# Patient Record
Sex: Female | Born: 1965 | Race: White | Hispanic: No | Marital: Married | Smoking: Never smoker
Health system: Southern US, Community
[De-identification: ages and names within clinical notes are randomized; demographics above are authoritative.]

## PROBLEM LIST (undated history)

## (undated) DIAGNOSIS — Z87442 Personal history of urinary calculi: Secondary | ICD-10-CM

## (undated) DIAGNOSIS — R339 Retention of urine, unspecified: Secondary | ICD-10-CM

## (undated) DIAGNOSIS — N390 Urinary tract infection, site not specified: Secondary | ICD-10-CM

## (undated) DIAGNOSIS — N1371 Vesicoureteral-reflux without reflux nephropathy: Secondary | ICD-10-CM

## (undated) DIAGNOSIS — N3020 Other chronic cystitis without hematuria: Secondary | ICD-10-CM

## (undated) DIAGNOSIS — K219 Gastro-esophageal reflux disease without esophagitis: Secondary | ICD-10-CM

## (undated) DIAGNOSIS — Z923 Personal history of irradiation: Secondary | ICD-10-CM

## (undated) HISTORY — PX: FOOT SURGERY: SHX648

## (undated) HISTORY — DX: Retention of urine, unspecified: R33.9

## (undated) HISTORY — PX: APPENDECTOMY: SHX54

## (undated) HISTORY — DX: Vesicoureteral-reflux without reflux nephropathy: N13.71

## (undated) HISTORY — PX: BREAST LUMPECTOMY: SHX2

## (undated) HISTORY — DX: Gastro-esophageal reflux disease without esophagitis: K21.9

## (undated) HISTORY — PX: OTHER SURGICAL HISTORY: SHX169

## (undated) HISTORY — DX: Other chronic cystitis without hematuria: N30.20

## (undated) HISTORY — DX: Urinary tract infection, site not specified: N39.0

---

## 1989-12-12 HISTORY — PX: BREAST BIOPSY: SHX20

## 1993-12-12 HISTORY — PX: LAPAROSCOPIC HYSTERECTOMY: SHX1926

## 1993-12-12 HISTORY — PX: ABDOMINAL HYSTERECTOMY: SHX81

## 1993-12-12 HISTORY — PX: OTHER SURGICAL HISTORY: SHX169

## 2000-12-12 HISTORY — PX: BREAST SURGERY: SHX581

## 2005-01-27 ENCOUNTER — Ambulatory Visit: Payer: Self-pay | Admitting: Internal Medicine

## 2006-02-20 ENCOUNTER — Ambulatory Visit: Payer: Self-pay | Admitting: Urology

## 2006-07-14 ENCOUNTER — Ambulatory Visit: Payer: Self-pay | Admitting: Internal Medicine

## 2006-11-07 ENCOUNTER — Ambulatory Visit: Payer: Self-pay | Admitting: Internal Medicine

## 2006-11-21 ENCOUNTER — Ambulatory Visit: Payer: Self-pay | Admitting: Internal Medicine

## 2007-10-01 ENCOUNTER — Ambulatory Visit: Payer: Self-pay | Admitting: Unknown Physician Specialty

## 2007-11-14 ENCOUNTER — Ambulatory Visit: Payer: Self-pay | Admitting: General Surgery

## 2008-12-03 ENCOUNTER — Ambulatory Visit: Payer: Self-pay | Admitting: General Surgery

## 2009-12-08 ENCOUNTER — Ambulatory Visit: Payer: Self-pay | Admitting: Unknown Physician Specialty

## 2009-12-16 ENCOUNTER — Ambulatory Visit: Payer: Self-pay | Admitting: Unknown Physician Specialty

## 2010-06-29 ENCOUNTER — Ambulatory Visit: Payer: Self-pay | Admitting: Unknown Physician Specialty

## 2010-09-28 ENCOUNTER — Emergency Department: Payer: Self-pay | Admitting: Unknown Physician Specialty

## 2010-12-09 ENCOUNTER — Ambulatory Visit: Payer: Self-pay | Admitting: Unknown Physician Specialty

## 2011-05-12 ENCOUNTER — Ambulatory Visit: Payer: Self-pay | Admitting: Internal Medicine

## 2011-12-12 ENCOUNTER — Ambulatory Visit: Payer: Self-pay | Admitting: Internal Medicine

## 2012-01-31 ENCOUNTER — Ambulatory Visit: Payer: Self-pay | Admitting: Urology

## 2012-03-06 ENCOUNTER — Ambulatory Visit: Payer: Self-pay | Admitting: Urology

## 2012-09-24 ENCOUNTER — Telehealth: Payer: Self-pay | Admitting: Internal Medicine

## 2012-09-24 DIAGNOSIS — N6019 Diffuse cystic mastopathy of unspecified breast: Secondary | ICD-10-CM

## 2012-09-24 NOTE — Telephone Encounter (Signed)
Pt came by and wanted to know if an order could be put in for her mammo. She usually had a diagnostic at [facility].

## 2012-09-24 NOTE — Telephone Encounter (Signed)
I placed an order for mammo.  See messaging.

## 2012-10-08 ENCOUNTER — Telehealth: Payer: Self-pay | Admitting: Internal Medicine

## 2012-10-08 NOTE — Telephone Encounter (Signed)
Pt called checking on mammogram appointment [phone]

## 2012-10-15 DIAGNOSIS — N319 Neuromuscular dysfunction of bladder, unspecified: Secondary | ICD-10-CM | POA: Insufficient documentation

## 2012-10-15 DIAGNOSIS — N3946 Mixed incontinence: Secondary | ICD-10-CM | POA: Insufficient documentation

## 2012-10-15 DIAGNOSIS — R339 Retention of urine, unspecified: Secondary | ICD-10-CM | POA: Insufficient documentation

## 2012-10-15 DIAGNOSIS — R399 Unspecified symptoms and signs involving the genitourinary system: Secondary | ICD-10-CM | POA: Insufficient documentation

## 2012-10-15 DIAGNOSIS — N1370 Vesicoureteral-reflux, unspecified: Secondary | ICD-10-CM | POA: Insufficient documentation

## 2012-10-23 ENCOUNTER — Ambulatory Visit: Payer: Self-pay | Admitting: Urology

## 2012-10-30 ENCOUNTER — Ambulatory Visit: Payer: Self-pay | Admitting: Urology

## 2012-12-12 HISTORY — PX: COLONOSCOPY: SHX174

## 2012-12-18 ENCOUNTER — Ambulatory Visit: Payer: Self-pay | Admitting: Internal Medicine

## 2012-12-21 ENCOUNTER — Encounter: Payer: Self-pay | Admitting: Internal Medicine

## 2012-12-21 DIAGNOSIS — Z800 Family history of malignant neoplasm of digestive organs: Secondary | ICD-10-CM

## 2013-01-01 ENCOUNTER — Encounter: Payer: Self-pay | Admitting: Internal Medicine

## 2013-02-01 ENCOUNTER — Encounter: Payer: Self-pay | Admitting: Internal Medicine

## 2013-02-07 ENCOUNTER — Encounter: Payer: Self-pay | Admitting: Internal Medicine

## 2013-02-07 ENCOUNTER — Ambulatory Visit (INDEPENDENT_AMBULATORY_CARE_PROVIDER_SITE_OTHER): Payer: Self-pay | Admitting: Internal Medicine

## 2013-02-07 VITALS — BP 99/66 | HR 88 | Temp 97.6°F | Ht 65.5 in | Wt 129.5 lb

## 2013-02-07 DIAGNOSIS — Z8742 Personal history of other diseases of the female genital tract: Secondary | ICD-10-CM

## 2013-02-07 DIAGNOSIS — N3020 Other chronic cystitis without hematuria: Secondary | ICD-10-CM

## 2013-02-07 DIAGNOSIS — R51 Headache: Secondary | ICD-10-CM

## 2013-02-07 MED ORDER — FLUTICASONE PROPIONATE 50 MCG/ACT NA SUSP: 2.0000 | Freq: Every day | NASAL | Status: DC

## 2013-02-08 ENCOUNTER — Ambulatory Visit: Payer: Self-pay | Admitting: Internal Medicine

## 2013-02-11 ENCOUNTER — Encounter: Payer: Self-pay | Admitting: Internal Medicine

## 2013-02-11 DIAGNOSIS — R519 Headache, unspecified: Secondary | ICD-10-CM | POA: Insufficient documentation

## 2013-02-11 DIAGNOSIS — Z8742 Personal history of other diseases of the female genital tract: Secondary | ICD-10-CM | POA: Insufficient documentation

## 2013-02-11 DIAGNOSIS — N3020 Other chronic cystitis without hematuria: Secondary | ICD-10-CM | POA: Insufficient documentation

## 2013-02-11 NOTE — Assessment & Plan Note (Signed)
Previously had issues with headaches.  Not reported as a problem today.

## 2013-02-11 NOTE — Assessment & Plan Note (Signed)
Was being followed by Dr Harold Hedge.  Now seeing Dr Toya Smothers.  Follow.

## 2013-02-11 NOTE — Assessment & Plan Note (Signed)
Seeing Dr Achilles Dunk.  Had bladder surgery.  Obtain latest records.  Currently doing better.

## 2013-02-11 NOTE — Progress Notes (Signed)
  Subjective:    Patient ID: Rose Bernard, female    DOB: 1966-09-04, 47 y.o.   MRN: [id number]  HPI 47 year old female with past history or frequent uti's who comes in today for a scheduled follow up.  She had bladder surgery 10/31/12.  Seeing Dr Achilles Dunk.  Not "leaking" like she was.  Has follow up planned 4/14.  Was recently evaluated and diagnosed with a sinus infection.  On Biaxin now.  Using Flonase and mucinex.  Better.  Planning for a colonoscopy next week.  Seeing Dr Markham Jordan.  She will make them aware of the abx and sinus infection.  Was evaluated at [facility]. Had her mammogram 1/14.  Obtain results.  Had pelvic exam.  Obtain records.  Overall she feels things are stable.   Past Medical History  Diagnosis Date  . Frequent UTI     Review of Systems Patient denies any headache, lightheadedness or dizziness.  Currently being treated for a sinus infection.  No chest pain, tightness or palpatations.  No increased shortness of breath, cough or congestion.  No nausea or vomiting.  No acid reflux.  No abdominal pain or cramping.  No bowel change, such as diarrhea, constipation, BRBPR or melana.  No urine change.  Overall bladder appears to be doing better.       Objective:   Physical Exam Filed Vitals:   02/07/13 1339  BP: 99/66  Pulse: 88  Temp: 97.6 F (24.20 C)   47 year old female in no acute distress.   HEENT:  Nares- clear.  Oropharynx - without lesions. NECK:  Supple.  Nontender.  No audible bruit.  HEART:  Appears to be regular. LUNGS:  No crackles or wheezing audible.  Respirations even and unlabored.  RADIAL PULSE:  Equal bilaterally. ABDOMEN:  Soft, nontender.  Bowel sounds present and normal.  No audible abdominal bruit.   EXTREMITIES:  No increased edema present.  DP pulses palpable and equal bilaterally.           Assessment & Plan:  SINUSITIS.  Currently being treated as outlined.  Follow.  Notify me if persistent problems.   HISTORY OF ABNORMAL MAMMOGRAM.   Saw Dr Lemar Livings.  Mammogram 12/12/11 - Birads II.  Obtain records from 1/14 mammogram.   HEALTH MAINTENANCE.  Is followed by [facility].  Plans to start getting her phyiscals here.  Planning for colonoscopy next week.  Cholesterol just checked 03/08/12 - wnl.  Mammogram 1/14.  Obtain results.

## 2013-02-20 DIAGNOSIS — Z800 Family history of malignant neoplasm of digestive organs: Secondary | ICD-10-CM | POA: Insufficient documentation

## 2013-02-27 ENCOUNTER — Encounter: Payer: Self-pay | Admitting: Adult Health

## 2013-02-27 ENCOUNTER — Ambulatory Visit (INDEPENDENT_AMBULATORY_CARE_PROVIDER_SITE_OTHER): Payer: BC Managed Care – PPO | Admitting: Adult Health

## 2013-02-27 ENCOUNTER — Telehealth: Payer: Self-pay | Admitting: Internal Medicine

## 2013-02-27 VITALS — BP 105/61 | HR 81 | Temp 98.1°F | Resp 16 | Ht 67.0 in | Wt 130.0 lb

## 2013-02-27 DIAGNOSIS — N390 Urinary tract infection, site not specified: Secondary | ICD-10-CM | POA: Insufficient documentation

## 2013-02-27 DIAGNOSIS — J329 Chronic sinusitis, unspecified: Secondary | ICD-10-CM

## 2013-02-27 LAB — POCT URINALYSIS DIPSTICK
Nitrite, UA: NEGATIVE
Spec Grav, UA: <=1.005
pH, UA: 5.5

## 2013-02-27 MED ORDER — CIPROFLOXACIN HCL 500 MG PO TB24: 500.0000 mg | ORAL_TABLET | Freq: Every day | ORAL | Status: DC

## 2013-02-27 MED ORDER — CIPROFLOXACIN HCL 500 MG PO TABS: 500.0000 mg | ORAL_TABLET | Freq: Two times a day (BID) | ORAL | Status: DC

## 2013-02-27 MED ORDER — HYDROCOD POLST-CHLORPHEN POLST 10-8 MG/5ML PO LQCR: 5.0000 mL | Freq: Two times a day (BID) | ORAL | Status: DC

## 2013-02-27 NOTE — Telephone Encounter (Signed)
Spoke with [facility] pharmacy and correction was made.

## 2013-02-27 NOTE — Assessment & Plan Note (Signed)
Patient presents with sinus symptoms as well as UTI. Dipstick done and shows trace leukocytes. Sending for culture and microscopic urine. I will cover for UTI and sinusitis. Cipro 500 mg x 7 days.

## 2013-02-27 NOTE — Progress Notes (Signed)
  Subjective:    Patient ID: Rose Bernard, female    DOB: [birth year], 47 y.o.   MRN: [id number]  HPI  Patient presents to clinic with congestion, sinus pressure, green mucus, chills, flank pain, cough keeping her up at night. She has taken mucinex D and robitussin. Symptoms began this past Saturday.   Current Outpatient Prescriptions on File Prior to Visit  Medication Sig Dispense Refill  . pseudoephedrine-guaifenesin (MUCINEX D) 60-600 MG per tablet Take 1 tablet by mouth every 12 (twelve) hours.      . clarithromycin (BIAXIN) 500 MG tablet Take 500 mg by mouth 2 (two) times daily.      . fluticasone (FLONASE) 50 MCG/ACT nasal spray Place 2 sprays into the nose daily.  16 g  1   No current facility-administered medications on file prior to visit.     Review of Systems  Constitutional: Positive for chills. Negative for fever.  HENT: Positive for congestion, sore throat, rhinorrhea and postnasal drip.   Respiratory: Positive for cough. Negative for chest tightness, shortness of breath and wheezing.   Gastrointestinal: Negative for nausea, vomiting and diarrhea.  Genitourinary: Positive for urgency and frequency. Negative for hematuria.  Psychiatric/Behavioral: Negative.     BP 105/61  Pulse 81  Temp(Src) 98.1 F (36.7 C) (Oral)  Resp 16  Ht 5\' 7"  (1.702 m)  Wt 130 lb (58.968 kg)  BMI 20.36 kg/m2  SpO2 97%  LMP 02/07/1994     Objective:   Physical Exam  Constitutional: She appears well-developed and well-nourished. No distress.  HENT:  Head: Normocephalic and atraumatic.  Left Ear: External ear normal.  Right ear canal slightly erythematous       Assessment & Plan:

## 2013-02-27 NOTE — Assessment & Plan Note (Signed)
Start Cipro 500 mg bid x 7 days. Patient also with UTI. Cover both.

## 2013-02-27 NOTE — Patient Instructions (Addendum)
  Please start Cipro today. You will take this for 7 days. Take 1 table twice daily.  Drink fluids to stay hydrated.  I have sent the urine for culture. I will let you know once the results are available.

## 2013-02-27 NOTE — Telephone Encounter (Signed)
Cipro is to expensive needing something else called into the pharmacy . Patient waiting.

## 2013-03-01 LAB — URINE CULTURE: Colony Count: >=100000

## 2013-03-04 ENCOUNTER — Telehealth: Payer: Self-pay | Admitting: Internal Medicine

## 2013-03-04 NOTE — Telephone Encounter (Signed)
Pt would like to find out results from her visit last week with Raquel.  Pt states she is also having severe neck pain and around her ear on the right side.   Please call.

## 2013-03-04 NOTE — Telephone Encounter (Signed)
Patient called again stating she is waiting on a return call. Please call patient

## 2013-03-05 NOTE — Telephone Encounter (Signed)
Patient notified that results have not been released and  and transferred to front so she can schedule appointment with Dr. Lorin Picket

## 2013-03-07 ENCOUNTER — Encounter: Payer: Self-pay | Admitting: Internal Medicine

## 2013-03-07 ENCOUNTER — Ambulatory Visit (INDEPENDENT_AMBULATORY_CARE_PROVIDER_SITE_OTHER)
Admission: RE | Admit: 2013-03-07 | Discharge: 2013-03-07 | Disposition: A | Discharge status: Home / Self Care | Payer: BC Managed Care – PPO | Source: Ambulatory Visit | Attending: Internal Medicine | Admitting: Internal Medicine

## 2013-03-07 ENCOUNTER — Ambulatory Visit (INDEPENDENT_AMBULATORY_CARE_PROVIDER_SITE_OTHER): Payer: BC Managed Care – PPO | Admitting: Internal Medicine

## 2013-03-07 VITALS — BP 100/54 | HR 73 | Temp 97.4°F | Ht 65.5 in | Wt 131.5 lb

## 2013-03-07 DIAGNOSIS — J329 Chronic sinusitis, unspecified: Secondary | ICD-10-CM

## 2013-03-07 DIAGNOSIS — N390 Urinary tract infection, site not specified: Secondary | ICD-10-CM

## 2013-03-07 DIAGNOSIS — M542 Cervicalgia: Secondary | ICD-10-CM

## 2013-03-07 MED ORDER — MELOXICAM 7.5 MG PO TABS: 7.5000 mg | ORAL_TABLET | Freq: Every day | ORAL | Status: DC

## 2013-03-07 MED ORDER — FLUTICASONE PROPIONATE 50 MCG/ACT NA SUSP: 2.0000 | Freq: Every day | NASAL | Status: DC

## 2013-03-07 MED ORDER — CYCLOBENZAPRINE HCL 5 MG PO TABS: ORAL_TABLET | ORAL | Status: DC

## 2013-03-08 ENCOUNTER — Encounter: Payer: Self-pay | Admitting: Internal Medicine

## 2013-03-08 ENCOUNTER — Telehealth: Payer: Self-pay | Admitting: Internal Medicine

## 2013-03-08 NOTE — Telephone Encounter (Signed)
Patient calling for her X ray results

## 2013-03-10 ENCOUNTER — Encounter: Payer: Self-pay | Admitting: Internal Medicine

## 2013-03-10 NOTE — Assessment & Plan Note (Signed)
Improved.  Follow.  

## 2013-03-10 NOTE — Assessment & Plan Note (Signed)
Symptoms improved.  Follow.  

## 2013-03-10 NOTE — Progress Notes (Signed)
  Subjective:    Patient ID: Rose Bernard, female    DOB: [birth year], 47 y.o.   MRN: [id number]  Neck Pain   47 year old female with past history or frequent uti's who comes in today as a work in with concerns regarding neck pain.  States symptoms started five days ago.  Increased neck pain - more on the right side.  Pain extencs up her right lateral neck.  Hurts up behind her ear.  Hurts to turn her head.  If she sits still and does not turn, no significant pain.  No headache.  No sinus pressure now.  Previous congestion - resolved.  No nausea or vomiting.  No urinary symptoms now.  No abdominal pain or cramping. Has been using ice and heat.     Past Medical History  Diagnosis Date  . Frequent UTI     Review of Systems Patient denies any headache, lightheadedness or dizziness.  Neck pain as outlined.  Hurts to tun her head.  No fever. Previous sinus symptoms have essentially resolved.  No rash.  No nausea or vomiting.  No bowel change.  Using ice and heat.  No injury or trauma.       Objective:   Physical Exam  Filed Vitals:   03/07/13 1336  BP: 100/54  Pulse: 73  Temp: 97.4 F (61.54 C)   47 year old female in no acute distress.   HEENT:  Nares- clear.  Oropharynx - without lesions. NECK:  Supple.  Increased pain with rotation of her head.  Pulling sensation with looking to right and left.   HEART:  Appears to be regular. LUNGS:  No crackles or wheezing audible.  Respirations even and unlabored.  RADIAL PULSE:  Equal bilaterally. ABDOMEN:  Soft, nontender.   EXTREMITIES:  No increased edema present.  DP pulses palpable and equal bilaterally.           Assessment & Plan:  MSK.  Neck pain as outlined.  Exam as outlined.  Treat with Mobic 7.5mg  q day.  Flexeril 5mg  q hs.  Check c-spine xray.  Further w/up pending.    HEALTH MAINTENANCE.  Is followed by [facility].  Plans to start getting her phyiscals here.  Planning for colonoscopy next week.  Cholesterol just checked  03/08/12 - wnl.  Mammogram 1/14.

## 2013-03-12 NOTE — Telephone Encounter (Signed)
Pt.notified

## 2013-03-25 ENCOUNTER — Telehealth: Payer: Self-pay | Admitting: Internal Medicine

## 2013-03-25 NOTE — Telephone Encounter (Signed)
If three weeks out, she will need to be reevaluated.  If agreeable, we can work her in this week.

## 2013-03-25 NOTE — Telephone Encounter (Signed)
Pt was seen by Raquel 3 weeks ago and was given something for sinus infection. And it has gotten much worse. She was wanting to know if something else could be called in or if she would have to be seen again ??? Pt uses [facility] on [address].

## 2013-03-25 NOTE — Telephone Encounter (Signed)
Called patient and set up appointment for her on Thursday at 8:15am for sinus infection

## 2013-03-28 ENCOUNTER — Ambulatory Visit: Payer: BC Managed Care – PPO | Admitting: Internal Medicine

## 2013-05-15 ENCOUNTER — Other Ambulatory Visit: Payer: Self-pay | Admitting: *Deleted

## 2013-05-15 DIAGNOSIS — N3941 Urge incontinence: Secondary | ICD-10-CM | POA: Insufficient documentation

## 2013-05-15 DIAGNOSIS — R350 Frequency of micturition: Secondary | ICD-10-CM | POA: Insufficient documentation

## 2013-06-04 ENCOUNTER — Telehealth: Payer: Self-pay | Admitting: Internal Medicine

## 2013-06-04 NOTE — Telephone Encounter (Signed)
FMLA paper work in Pharmac[employer]

## 2013-06-10 ENCOUNTER — Telehealth: Payer: Self-pay | Admitting: *Deleted

## 2013-06-10 NOTE — Telephone Encounter (Signed)
Pt called this morning wanting to know the status on her FMLA forms that she dropped off on 06/04/13

## 2013-06-11 NOTE — Telephone Encounter (Signed)
Pt informed that FMLA papers have been completed. Pt would like to pick them up. Copy made & original placed up front for pick up.

## 2013-06-11 NOTE — Telephone Encounter (Signed)
Form completed and placed in your box.  

## 2013-09-23 ENCOUNTER — Encounter: Payer: Self-pay | Admitting: Internal Medicine

## 2013-09-23 ENCOUNTER — Ambulatory Visit (INDEPENDENT_AMBULATORY_CARE_PROVIDER_SITE_OTHER): Payer: BC Managed Care – PPO | Admitting: Internal Medicine

## 2013-09-23 ENCOUNTER — Encounter (INDEPENDENT_AMBULATORY_CARE_PROVIDER_SITE_OTHER): Payer: Self-pay

## 2013-09-23 VITALS — BP 110/78 | HR 72 | Temp 98.0°F | Ht 66.0 in | Wt 126.0 lb

## 2013-09-23 DIAGNOSIS — N390 Urinary tract infection, site not specified: Secondary | ICD-10-CM

## 2013-09-23 DIAGNOSIS — Z23 Encounter for immunization: Secondary | ICD-10-CM

## 2013-09-23 DIAGNOSIS — R079 Chest pain, unspecified: Secondary | ICD-10-CM

## 2013-09-23 DIAGNOSIS — R0781 Pleurodynia: Secondary | ICD-10-CM

## 2013-09-23 DIAGNOSIS — Z800 Family history of malignant neoplasm of digestive organs: Secondary | ICD-10-CM

## 2013-09-23 DIAGNOSIS — Z124 Encounter for screening for malignant neoplasm of cervix: Secondary | ICD-10-CM

## 2013-09-23 DIAGNOSIS — R109 Unspecified abdominal pain: Secondary | ICD-10-CM

## 2013-09-23 DIAGNOSIS — Z8742 Personal history of other diseases of the female genital tract: Secondary | ICD-10-CM

## 2013-09-23 DIAGNOSIS — Z13220 Encounter for screening for lipoid disorders: Secondary | ICD-10-CM

## 2013-09-23 DIAGNOSIS — N3020 Other chronic cystitis without hematuria: Secondary | ICD-10-CM

## 2013-09-23 DIAGNOSIS — Z1211 Encounter for screening for malignant neoplasm of colon: Secondary | ICD-10-CM

## 2013-09-24 ENCOUNTER — Other Ambulatory Visit (HOSPITAL_COMMUNITY)
Admission: RE | Admit: 2013-09-24 | Discharge: 2013-09-24 | Disposition: A | Discharge status: Home / Self Care | Payer: BC Managed Care – PPO | Source: Ambulatory Visit | Attending: Internal Medicine | Admitting: Internal Medicine

## 2013-09-24 ENCOUNTER — Telehealth: Payer: Self-pay | Admitting: Internal Medicine

## 2013-09-24 DIAGNOSIS — Z1151 Encounter for screening for human papillomavirus (HPV): Secondary | ICD-10-CM | POA: Insufficient documentation

## 2013-09-24 DIAGNOSIS — Z01419 Encounter for gynecological examination (general) (routine) without abnormal findings: Secondary | ICD-10-CM | POA: Insufficient documentation

## 2013-09-24 NOTE — Telephone Encounter (Signed)
Patient Information:  Caller Name: Suszanne Conners  Phone: [phone]  Patient: Rose Bernard, Rose Bernard  Gender: Female  DOB: 10-07-1966  Age: 47 Years  PCP: Dale   Pregnant: No  Office Follow Up:  Does the office need to follow up with this patient?: Yes  Instructions For The Office: History of Migraines and similar symptoms reported. .  Out of Maxalt.  [facility] [address] is preferred pharmacy.   Symptoms  Reason For Call & Symptoms: Mom/ Lala calling about Terri.  Posterior headache and vomiting. She had flu shot 09/23/13.  Headache rated at 10 of 10.    Relates history of migraines with similar pain. Took Mucinex at 11:00 and Sudafed PE without relief.  Nose clear; earache.  Being up makes pain worse.  Emergent symptoms ruled out  Callback by PCP or Subspecialist within 1 Hour per Headache guideline due to Severe headache and has had severe headaches before.  Maxalt 10 mg was Rx previously. and worked estimated 3 weeks prior to this call.  She has no more medication.  Reviewed Health History In EMR: Yes  Reviewed Medications In EMR: Yes  Reviewed Allergies In EMR: Yes  Reviewed Surgeries / Procedures: Yes  Date of Onset of Symptoms: 09/24/2013  Treatments Tried: Mucinex, Sudafed PE  Treatments Tried Worked: No OB / GYN:  LMP: Unknown  Guideline(s) Used:  Headache  Disposition Per Guideline:   Callback by PCP or Subspecialist within 1 Hour  Reason For Disposition Reached:   Severe headache and has had severe headaches before  Advice Given:  Reassurance - Migraine Headache:  You have told me that this headache is similar to previous migraine headaches that you have had. If the pattern or severity of your headache changes, you will need to see your physician.  Migraine headaches are also called vascular headaches. A migraine can be anywhere from mild to severely painful. Sufferers often describe it as throbbing or pulsing. It is often just on one side. Associated symptoms include  nausea and vomiting. Some individuals will have visual or other neurological warning symptoms (aura) that a migraine is coming.  This sounds like a painful headache that you are having, but there are pain medications you can take and other instructions I can give you to reduce the pain.  Here is some care advice that should help.  Migraine Medication:   If your doctor has prescribed specific medication for your migraine, take it as directed as soon as the migraine starts.  Rest:   Lie down in a dark, quiet place and try to relax. Close your eyes and imagine your entire body relaxing.  Rest:   Lie down in a dark, quiet place and try to relax. Close your eyes and imagine your entire body relaxing.  Apply Cold to the Area:   Apply a cold wet washcloth or cold pack to the forehead for 20 minutes.  Apply Cold to the Area:   Apply a cold wet washcloth or cold pack to the forehead for 20 minutes.  Call Back If:  Headache lasts longer than 24 hours  You become worse.  Patient Will Follow Care Advice:  YES

## 2013-09-24 NOTE — Telephone Encounter (Signed)
If headache 10/10 - needs eval to confirm nothing more acute going on.  Needs eval today.  (acute care or er pending sx)

## 2013-09-24 NOTE — Telephone Encounter (Signed)
Duplicate. See other note.

## 2013-09-24 NOTE — Telephone Encounter (Signed)
Pt notified that based on her sx's it is recommended that she goes to Acute Care. Pt verbalized understanding & I asked pt to call with an update tomorrow.

## 2013-09-26 ENCOUNTER — Encounter: Payer: Self-pay | Admitting: *Deleted

## 2013-09-29 ENCOUNTER — Encounter: Payer: Self-pay | Admitting: Internal Medicine

## 2013-09-29 ENCOUNTER — Telehealth: Payer: Self-pay | Admitting: Internal Medicine

## 2013-09-29 DIAGNOSIS — R0781 Pleurodynia: Secondary | ICD-10-CM | POA: Insufficient documentation

## 2013-09-29 NOTE — Assessment & Plan Note (Signed)
Colonoscopy as outlined.  Heme positive on exam.  IFOB.  Follow.

## 2013-09-29 NOTE — Telephone Encounter (Signed)
Please call pt for an update.  I saw her and she was having rib pain and some breast pain.  Need to know if persistent pain or any change in her breast exam.  If so, I can schedule her mammogram earlier then when due or other w/up.

## 2013-09-29 NOTE — Progress Notes (Signed)
  Subjective:    Patient ID: Rose Bernard, female    DOB: 09-17-1966, 47 y.o.   MRN: [id number]  HPI 46 year old female with past history or frequent uti's who comes in today for her complete physical exam.  She had bladder surgery 10/31/12.  Seeing Dr Achilles Dunk.  Not "leaking" like she was.  Has received Botox injections.  Continues follow up with Dr Achilles Dunk.  Had seen Dr Markham Jordan.  Had colonoscopy in the spring.  Need results.    Was evaluated at [facility]. Had her mammogram 1/14.  Here for a physical.  Had been having problems with her right great toe and left fifth toe.  Had surgery.  Doing better.  Still pain.  Out of work.  Planning to follow up with Dr Charlsie Merles next week.  She also reports having some pain in her left lateral ribs.  No pain with deep breathing.  No sob.  No chest pain or tightness.     Past Medical History  Diagnosis Date  . Frequent UTI     Review of Systems Patient denies any significant headache, lightheadedness or dizziness.  No significant sinus or allergy symptoms.  No chest pain, tightness or palpatations.  No increased shortness of breath, cough or congestion.  No nausea or vomiting.  No acid reflux.  No abdominal pain or cramping.  No bowel change, such as diarrhea, constipation, BRBPR or melana.  No urine change.  Overall bladder appears to be doing better.  Rib pain as outlined.  S/p foot surgery.  Still with some pain.  Following with Dr Charlsie Merles.       Objective:   Physical Exam  Filed Vitals:   09/23/13 1530  BP: 110/78  Pulse: 72  Temp: 98 F (80.29 C)   47 year old female in no acute distress.   HEENT:  Nares- clear.  Oropharynx - without lesions. NECK:  Supple.  Nontender.  No audible bruit.  HEART:  Appears to be regular. LUNGS:  No crackles or wheezing audible.  Respirations even and unlabored.  RADIAL PULSE:  Equal bilaterally.    BREASTS:  No nipple discharge or nipple retraction present.  Some discomfort in the left breast 2-3:00.  No axillary  adenopathy.   ABDOMEN:  Soft, nontender.  Bowel sounds present and normal.  No audible abdominal bruit.  GU:  Normal external genitalia.  Vaginal vault without lesions. Pap performed. Could not appreciate any adnexal masses or tenderness.   RECTAL:  Heme positive.     EXTREMITIES:  No increased edema present.  DP pulses palpable and equal bilaterally.           Assessment & Plan:  HISTORY OF ABNORMAL MAMMOGRAM.  Saw Dr Lemar Livings.  Mammogram 12/12/11 - Birads II.  Obtain records from 1/14 mammogram.   HEALTH MAINTENANCE.  Physical today.   Colonoscopy in the spring 2014.  Results as outlined.   Cholesterol just checked 03/08/12 - wnl.  Mammogram 1/14 - Birads II.  Obtain results.

## 2013-09-29 NOTE — Assessment & Plan Note (Addendum)
Was being followed by Dr Kincius.  Then was following with Dr Knowles- Jonas.  Currently stable.   

## 2013-09-29 NOTE — Assessment & Plan Note (Signed)
Seeing Dr Cope.  Had bladder surgery.  S/p botox injections.   Currently doing better.   

## 2013-09-29 NOTE — Assessment & Plan Note (Signed)
Not an issue now.  Continues to follow up with Dr Achilles Dunk.

## 2013-09-29 NOTE — Assessment & Plan Note (Signed)
States is better.  Still some discomfort.  Wants to hold on any further w/up at this time.  Follow. Notify me if pain persist.

## 2013-09-30 ENCOUNTER — Other Ambulatory Visit (INDEPENDENT_AMBULATORY_CARE_PROVIDER_SITE_OTHER): Payer: BC Managed Care – PPO

## 2013-09-30 DIAGNOSIS — Z13220 Encounter for screening for lipoid disorders: Secondary | ICD-10-CM

## 2013-09-30 DIAGNOSIS — R109 Unspecified abdominal pain: Secondary | ICD-10-CM

## 2013-09-30 DIAGNOSIS — Z1211 Encounter for screening for malignant neoplasm of colon: Secondary | ICD-10-CM

## 2013-09-30 LAB — COMPREHENSIVE METABOLIC PANEL
ALT: 12 U/L (ref 0–35)
AST: 13 U/L (ref 0–37)
Albumin: 3.9 g/dL (ref 3.5–5.2)
Alkaline Phosphatase: 53 U/L (ref 39–117)
CO2: 27 mEq/L (ref 19–32)
Creatinine, Ser: 0.7 mg/dL (ref 0.4–1.2)
GFR: 95.04 mL/min (ref >60.00)
Potassium: 4 mEq/L (ref 3.5–5.1)
Sodium: 140 mEq/L (ref 135–145)
Total Bilirubin: 1 mg/dL (ref 0.3–1.2)
Total Protein: 7 g/dL (ref 6.0–8.3)

## 2013-09-30 LAB — CBC WITH DIFFERENTIAL/PLATELET
Basophils Absolute: 0 10*3/uL (ref 0.0–0.1)
HCT: 39.2 % (ref 36.0–46.0)
Lymphs Abs: 1.3 10*3/uL (ref 0.7–4.0)
MCV: 92.8 fl (ref 78.0–100.0)
Monocytes Absolute: 0.4 10*3/uL (ref 0.1–1.0)
Monocytes Relative: 8.6 % (ref 3.0–12.0)
Platelets: 254 10*3/uL (ref 150.0–400.0)
RDW: 13.1 % (ref 11.5–14.6)

## 2013-09-30 LAB — LIPID PANEL
HDL: 72.8 mg/dL (ref >39.00)
Total CHOL/HDL Ratio: 2
Triglycerides: 44 mg/dL (ref 0.0–149.0)
VLDL: 8.8 mg/dL (ref 0.0–40.0)

## 2013-09-30 LAB — TSH: TSH: 1.25 u[IU]/mL (ref 0.35–5.50)

## 2013-09-30 NOTE — Telephone Encounter (Signed)
Noted  

## 2013-09-30 NOTE — Telephone Encounter (Signed)
Pt states that she is doing fine & plans on calling today to schedule mammogram for January.

## 2013-10-01 ENCOUNTER — Encounter: Payer: Self-pay | Admitting: *Deleted

## 2013-10-01 ENCOUNTER — Encounter: Payer: Self-pay | Admitting: Podiatry

## 2013-10-01 ENCOUNTER — Ambulatory Visit (INDEPENDENT_AMBULATORY_CARE_PROVIDER_SITE_OTHER): Payer: BC Managed Care – PPO | Admitting: Podiatry

## 2013-10-01 ENCOUNTER — Ambulatory Visit (INDEPENDENT_AMBULATORY_CARE_PROVIDER_SITE_OTHER): Payer: BC Managed Care – PPO

## 2013-10-01 VITALS — BP 126/70 | HR 72 | Ht 66.0 in | Wt 120.0 lb

## 2013-10-01 DIAGNOSIS — M2021 Hallux rigidus, right foot: Secondary | ICD-10-CM

## 2013-10-01 DIAGNOSIS — M2040 Other hammer toe(s) (acquired), unspecified foot: Secondary | ICD-10-CM

## 2013-10-01 DIAGNOSIS — M2020 Hallux rigidus, unspecified foot: Secondary | ICD-10-CM

## 2013-10-01 DIAGNOSIS — M2042 Other hammer toe(s) (acquired), left foot: Secondary | ICD-10-CM

## 2013-10-01 DIAGNOSIS — M2010 Hallux valgus (acquired), unspecified foot: Secondary | ICD-10-CM

## 2013-10-01 LAB — FECAL OCCULT BLOOD, IMMUNOCHEMICAL: Fecal Occult Bld: NEGATIVE

## 2013-10-01 NOTE — Patient Instructions (Signed)
Increase activity over the next 4 weeks and use ibuprophen for discomfort

## 2013-10-01 NOTE — Progress Notes (Signed)
Subjective:     Patient ID: Rose Bernard, female   DOB: 05/03/1966, 47 y.o.   MRN: [id number]  HPI patient states I'm doing well but still having swelling right over left foot and my left fifth toe gets sore when it gets bumped   Review of Systems  All other systems reviewed and are negative.       Objective:   Physical Exam  Constitutional: She appears well-developed and well-nourished.  Musculoskeletal: Normal range of motion.  Neurological: She is alert.  Skin: Skin is warm.   patient's feet are healing well and the incision site right is doing very well good range of motion first MPJ 35 dorsiflexion 25 of plantarflexion. Toe left mildly swollen but healing well     Assessment:     Patient doing well normal recovery from extensive forefoot surgery    Plan:     Encouraged to wear different types of shoes and gradually increase activity. Reviewed x-rays and we'll reappoint 4 weeks with hopeful return to work at that time

## 2013-10-29 ENCOUNTER — Ambulatory Visit (INDEPENDENT_AMBULATORY_CARE_PROVIDER_SITE_OTHER): Payer: BC Managed Care – PPO

## 2013-10-29 ENCOUNTER — Encounter: Payer: Self-pay | Admitting: Podiatry

## 2013-10-29 ENCOUNTER — Ambulatory Visit (INDEPENDENT_AMBULATORY_CARE_PROVIDER_SITE_OTHER): Payer: BC Managed Care – PPO | Admitting: Podiatry

## 2013-10-29 VITALS — BP 102/69 | HR 92 | Resp 16 | Ht 66.0 in | Wt 119.0 lb

## 2013-10-29 DIAGNOSIS — Z9889 Other specified postprocedural states: Secondary | ICD-10-CM

## 2013-10-29 DIAGNOSIS — M2021 Hallux rigidus, right foot: Secondary | ICD-10-CM

## 2013-10-29 DIAGNOSIS — M2040 Other hammer toe(s) (acquired), unspecified foot: Secondary | ICD-10-CM

## 2013-10-29 DIAGNOSIS — M2020 Hallux rigidus, unspecified foot: Secondary | ICD-10-CM

## 2013-10-29 NOTE — Progress Notes (Signed)
Subjective:     Patient ID: Rose Bernard, female   DOB: 04/15/1966, 47 y.o.   MRN: [id number]  HPI patient states IM doing better but still getting some tingling in my right foot and the little toe on my left foot will bother me at times approximately 10 weeks after surgery   Review of Systems     Objective:   Physical Exam  Nursing note and vitals reviewed. Constitutional: She is oriented to person, place, and time.  Cardiovascular: Intact distal pulses.   Musculoskeletal: Normal range of motion.  Neurological: She is oriented to person, place, and time.  Skin: Skin is warm.   patient is found to have well-healed surgical sites on the right dorsum foot and the left fifth toe with mild edema in the left fifth toe and excellent range of motion of the first MPJ right with 35 of dorsiflexion and 25 of plantar flexion with no pain or crepitus noted    Assessment:     Healing well post surgery right and left foot    Plan:     Educated on return to activity and allow patient to return to work on Monday. Reviewed x-rays with patient and she will be seen back if any issues should occur

## 2013-10-29 NOTE — Progress Notes (Signed)
  Subjective:    Patient ID: Rose Bernard, female    DOB: [birth year], 47 y.o.   MRN: [id number]  HPI    Review of Systems  Constitutional: Negative.   HENT: Negative.   Eyes: Negative.   Respiratory: Negative.   Cardiovascular: Negative.   Gastrointestinal: Negative.   Endocrine: Negative.   Genitourinary: Negative.   Musculoskeletal: Negative.   Skin: Negative.   Allergic/Immunologic: Negative.   Neurological: Negative.   Hematological: Negative.   Psychiatric/Behavioral: Negative.        Objective:   Physical Exam        Assessment & Plan:

## 2013-12-19 ENCOUNTER — Ambulatory Visit: Payer: Self-pay | Admitting: Internal Medicine

## 2013-12-19 LAB — HM MAMMOGRAPHY: HM Mammogram: NEGATIVE

## 2013-12-23 ENCOUNTER — Encounter: Payer: Self-pay | Admitting: Internal Medicine

## 2013-12-26 ENCOUNTER — Ambulatory Visit (INDEPENDENT_AMBULATORY_CARE_PROVIDER_SITE_OTHER): Payer: BC Managed Care – PPO | Admitting: Internal Medicine

## 2013-12-26 ENCOUNTER — Encounter: Payer: Self-pay | Admitting: Internal Medicine

## 2013-12-26 VITALS — BP 110/60 | HR 65 | Temp 98.1°F | Ht 66.0 in | Wt 129.0 lb

## 2013-12-26 DIAGNOSIS — R079 Chest pain, unspecified: Secondary | ICD-10-CM

## 2013-12-26 DIAGNOSIS — R0981 Nasal congestion: Secondary | ICD-10-CM

## 2013-12-26 DIAGNOSIS — R0781 Pleurodynia: Secondary | ICD-10-CM

## 2013-12-26 DIAGNOSIS — Z800 Family history of malignant neoplasm of digestive organs: Secondary | ICD-10-CM

## 2013-12-26 DIAGNOSIS — M533 Sacrococcygeal disorders, not elsewhere classified: Secondary | ICD-10-CM

## 2013-12-26 DIAGNOSIS — Z8742 Personal history of other diseases of the female genital tract: Secondary | ICD-10-CM

## 2013-12-26 DIAGNOSIS — J3489 Other specified disorders of nose and nasal sinuses: Secondary | ICD-10-CM

## 2013-12-26 DIAGNOSIS — N3020 Other chronic cystitis without hematuria: Secondary | ICD-10-CM

## 2013-12-26 MED ORDER — FLUTICASONE PROPIONATE 50 MCG/ACT NA SUSP: 2.0000 | Freq: Every day | NASAL | Status: DC

## 2013-12-26 NOTE — Progress Notes (Signed)
  Subjective:    Patient ID: Rose Bernard, female    DOB: [birth year], 48 y.o.   MRN: [id number]  HPI 48 year old female with past history or frequent uti's who comes in today for a scheduled follow up.  She had bladder surgery 10/31/12.  Seeing Dr Jacqlyn Larsen.  Not "leaking" like she was.  Has received Botox injections.  Continues follow up with Dr Jacqlyn Larsen.  Had seen Dr Tiffany Kocher.  Had colonoscopy in the spring.   Was evaluated at [facility]. Had her mammogram 1/14 ok.  States just had f/u mammogram and was ok.  Had been having problems with her right great toe and left fifth toe.  Had surgery.  Doing better.  Still some pain, but is better.  Back to work 11/04/13.  Wearing steel toed boots now and doing better.  Seeing Dr Paulla Dolly.  No sob.  No chest pain or tightness.     Past Medical History  Diagnosis Date  . Frequent UTI     Current Outpatient Prescriptions on File Prior to Visit  Medication Sig Dispense Refill  . acetaminophen (TYLENOL) 500 MG tablet Take 500 mg by mouth as needed for pain.       No current facility-administered medications on file prior to visit.   Review of Systems Patient denies any significant headache, lightheadedness or dizziness.  Some nasal congestion.  No chest pain, tightness or palpatations.  No increased shortness of breath, cough or congestion.  No nausea or vomiting.  No acid reflux.  No abdominal pain or cramping.  No bowel change, such as diarrhea, constipation, BRBPR or melana.  No urine change.  Overall bladder appears to be doing better.  S/p foot surgery.  Following with Dr Paulla Dolly.       Objective:   Physical Exam  Filed Vitals:   12/26/13 1602  BP: 110/60  Pulse: 65  Temp: 98.1 F (42.50 C)   48 year old female in no acute distress.   HEENT:  Nares- clear.  Oropharynx - without lesions. NECK:  Supple.  Nontender.  No audible bruit.  HEART:  Appears to be regular. LUNGS:  No crackles or wheezing audible.  Respirations even and unlabored.  RADIAL  PULSE:  Equal bilaterally.   ABDOMEN:  Soft, nontender.  Bowel sounds present and normal.  No audible abdominal bruit.      EXTREMITIES:  No increased edema present.  DP pulses palpable and equal bilaterally.           Assessment & Plan:  HISTORY OF ABNORMAL MAMMOGRAM.  Saw Dr Bary Castilla.  Mammogram 12/12/11 - Birads II.  Had mammogram 1/14 and 1/15.  Per pt ok.    HEALTH MAINTENANCE.  Physical last visit.   Colonoscopy in the spring 2014.  Results as outlined.   Mammogram 1/14 - Birads II.  States just had mammogram.  Everything checked out fine per pt.  Obtain results.

## 2013-12-26 NOTE — Progress Notes (Signed)
Pre-visit discussion using our clinic review tool. No additional management support is needed unless otherwise documented below in the visit note.  

## 2013-12-29 ENCOUNTER — Encounter: Payer: Self-pay | Admitting: Internal Medicine

## 2013-12-29 DIAGNOSIS — M533 Sacrococcygeal disorders, not elsewhere classified: Secondary | ICD-10-CM | POA: Insufficient documentation

## 2013-12-29 DIAGNOSIS — R0981 Nasal congestion: Secondary | ICD-10-CM | POA: Insufficient documentation

## 2013-12-29 NOTE — Assessment & Plan Note (Signed)
Seeing Dr Cope.  Had bladder surgery.  S/p botox injections.   Currently doing better.   

## 2013-12-29 NOTE — Assessment & Plan Note (Signed)
Not an issue now

## 2013-12-29 NOTE — Assessment & Plan Note (Signed)
Some pain that appears to be more localized to her coccyx.  Appears to be better.  Still some intermittent pain.  Desires no further testing or evaluation.  Try to keep pressure off the area.  Follow.  Notify me if persistent or if she changes her mind.

## 2013-12-29 NOTE — Assessment & Plan Note (Signed)
Colonoscopy as outlined.  Recommended f/u colonoscopy in five years.  IFOB just checked - negative.

## 2013-12-29 NOTE — Assessment & Plan Note (Signed)
Was being followed by Dr Kincius.  Then was following with Dr Knowles- Jonas.  Currently stable.   

## 2013-12-29 NOTE — Assessment & Plan Note (Signed)
Minimal symptoms.  Do not feel abx warranted.  Saline nasal spray and flonase as directed.  Follow.

## 2014-01-19 ENCOUNTER — Emergency Department: Payer: Self-pay | Admitting: Emergency Medicine

## 2014-03-21 ENCOUNTER — Encounter: Payer: Self-pay | Admitting: Adult Health

## 2014-03-21 ENCOUNTER — Ambulatory Visit (INDEPENDENT_AMBULATORY_CARE_PROVIDER_SITE_OTHER): Payer: BC Managed Care – PPO | Admitting: Adult Health

## 2014-03-21 VITALS — BP 110/71 | HR 61 | Temp 98.4°F | Wt 127.0 lb

## 2014-03-21 DIAGNOSIS — R109 Unspecified abdominal pain: Secondary | ICD-10-CM

## 2014-03-21 DIAGNOSIS — R3129 Other microscopic hematuria: Secondary | ICD-10-CM

## 2014-03-21 LAB — URINALYSIS, ROUTINE W REFLEX MICROSCOPIC
BILIRUBIN URINE: NEGATIVE
HGB URINE DIPSTICK: NEGATIVE
KETONES UR: NEGATIVE
Leukocytes, UA: NEGATIVE
Nitrite: NEGATIVE
Specific Gravity, Urine: 1.025 (ref 1.000–1.030)
TOTAL PROTEIN, URINE-UPE24: NEGATIVE
URINE GLUCOSE: NEGATIVE
UROBILINOGEN UA: 0.2 (ref 0.0–1.0)
pH: 6 (ref 5.0–8.0)

## 2014-03-21 LAB — POCT URINALYSIS DIPSTICK
Bilirubin, UA: NEGATIVE
Glucose, UA: NEGATIVE
Ketones, UA: NEGATIVE
LEUKOCYTES UA: NEGATIVE
NITRITE UA: NEGATIVE
PROTEIN UA: NEGATIVE
Spec Grav, UA: 1.025
Urobilinogen, UA: 1
pH, UA: 6

## 2014-03-21 MED ORDER — CIPROFLOXACIN HCL 250 MG PO TABS: 250.0000 mg | ORAL_TABLET | Freq: Two times a day (BID) | ORAL | Status: DC

## 2014-03-21 NOTE — Progress Notes (Signed)
Pre visit review using our clinic review tool, if applicable. No additional management support is needed unless otherwise documented below in the visit note. 

## 2014-03-21 NOTE — Patient Instructions (Signed)
  Start cipro 250 mg twice a day for 5 days.  Drink water to keep urine straw colored.  I am sending urine for culture. We will contact you with results once available.  You can try AZO sold over the counter for urinary symptom relief. Take no more than 3 days.  Return to clinic if no improvement in symptoms.

## 2014-03-21 NOTE — Progress Notes (Signed)
   Subjective:    Patient ID: Rose Bernard, female    DOB: 1966/07/19, 48 y.o.   MRN: [id number]  HPI  Patient is a pleasant 48 year old female who presents to clinic with left flank pain. Symptoms began on Monday. She thought it might of been muscular; however, with the weekend approaching she did not want to wait. She denies hematuria. She has been experiencing some chills. No fever. Occasional dysuria. She has been taking Aleve without improvement.  Past Medical History  Diagnosis Date  . Frequent UTI     Review of Systems  Constitutional: Positive for chills. Negative for fever.  Genitourinary: Positive for dysuria, frequency and flank pain. Negative for urgency, hematuria and difficulty urinating.  All other systems reviewed and are negative.      Objective:   Physical Exam  Constitutional: She is oriented to person, place, and time. No distress.  Cardiovascular: Normal rate and regular rhythm.   Pulmonary/Chest: Effort normal. No respiratory distress.  Musculoskeletal: Normal range of motion.  Neurological: She is alert and oriented to person, place, and time.  Skin: Skin is warm and dry.  Psychiatric: She has a normal mood and affect. Her behavior is normal. Judgment and thought content normal.       Assessment & Plan:   1. Flank pain UA shows trace blood. No nitrites or leukocytes. Send for culture and urinalysis. Start Cipro to treat empirically for UTI. May take AZO for symptom relief for 3 days only. RTC if no improvement. - POCT Urinalysis Dipstick

## 2014-03-23 LAB — URINE CULTURE

## 2014-05-27 ENCOUNTER — Telehealth: Payer: Self-pay | Admitting: Internal Medicine

## 2014-05-27 NOTE — Telephone Encounter (Signed)
FMLA paper in Dr. Bary Leriche box to be filled out. Please call patient when ready/msn

## 2014-05-28 NOTE — Telephone Encounter (Signed)
Form placed in Dr. Nicki Reaper folder for completion

## 2014-05-29 NOTE — Telephone Encounter (Signed)
Left message, notifying pt.  

## 2014-05-29 NOTE — Telephone Encounter (Signed)
I will complete when I return to work.  Make sure pt aware that I am out of the office.  Thanks.

## 2014-06-05 NOTE — Telephone Encounter (Signed)
Pt called requesting status of FMLA forms.  Please advise pt at [phone].

## 2014-06-05 NOTE — Telephone Encounter (Signed)
Pt also stopped by office to check on status of form. Please advise when ready.

## 2014-06-06 DIAGNOSIS — Z0279 Encounter for issue of other medical certificate: Secondary | ICD-10-CM

## 2014-06-06 NOTE — Telephone Encounter (Signed)
Form faxed to [facility] & copy placed up front for pick-up (pt notified)

## 2014-06-06 NOTE — Telephone Encounter (Signed)
Form completed.  In your basket.

## 2014-08-21 DIAGNOSIS — R109 Unspecified abdominal pain: Secondary | ICD-10-CM | POA: Insufficient documentation

## 2014-09-16 ENCOUNTER — Ambulatory Visit (INDEPENDENT_AMBULATORY_CARE_PROVIDER_SITE_OTHER): Payer: BC Managed Care – PPO | Admitting: Internal Medicine

## 2014-09-16 ENCOUNTER — Encounter: Payer: Self-pay | Admitting: Internal Medicine

## 2014-09-16 VITALS — BP 98/54 | HR 59 | Temp 97.7°F | Wt 121.0 lb

## 2014-09-16 DIAGNOSIS — H9202 Otalgia, left ear: Secondary | ICD-10-CM

## 2014-09-16 DIAGNOSIS — J029 Acute pharyngitis, unspecified: Secondary | ICD-10-CM

## 2014-09-16 DIAGNOSIS — B349 Viral infection, unspecified: Secondary | ICD-10-CM

## 2014-09-16 NOTE — Progress Notes (Signed)
Pre visit review using our clinic review tool, if applicable. No additional management support is needed unless otherwise documented below in the visit note. 

## 2014-09-16 NOTE — Patient Instructions (Addendum)

## 2014-09-16 NOTE — Progress Notes (Signed)
HPI  Pt presents to the clinic today with c/o facial pain and swelling, headache and sore throat. She reports this started 2 days ago. It seems worse on the left side of her head. She has had some dizziness off and on. She denies fever, chills or body aches. She has tried sinus medication OTC without any relief. She does reports that she did have a flare of her cystitis 2 days ago but is not sure if this is related or not.  Review of Systems    Past Medical History  Diagnosis Date  . Frequent UTI     Family History  Problem Relation Age of Onset  . Colon cancer Father     With ostomy in place   . Breast cancer Maternal Aunt     History   Social History  . Marital Status: Married    Spouse Name: N/A    Number of Children: 2  . Years of Education: N/A   Occupational History  . Not on file.   Social History Main Topics  . Smoking status: Never Smoker   . Smokeless tobacco: Never Used  . Alcohol Use: No  . Drug Use: No  . Sexual Activity: Not on file   Other Topics Concern  . Not on file   Social History Narrative  . No narrative on file    Allergies  Allergen Reactions  . Penicillin V Potassium      Constitutional: Positive headache. Denies fatigue, fever or abrupt weight changes.  HEENT:  Positive facial pain, nasal congestion and sore throat. Denies eye redness, ear pain, ringing in the ears, wax buildup, runny nose or bloody nose. Respiratory:  Denies cough, difficulty breathing or shortness of breath.  Cardiovascular: Denies chest pain, chest tightness, palpitations or swelling in the hands or feet.   No other specific complaints in a complete review of systems (except as listed in HPI above).  Objective:   BP 98/54  Pulse 59  Temp(Src) 97.7 F (36.5 C) (Oral)  Wt 121 lb (54.885 kg)  SpO2 99%  LMP 02/07/1994  General: Appears her stated age, well developed, well nourished in NAD. HEENT: Maxillary sinus tenderness on the left noted; Ears: Tm's gray  and intact, normal light reflex; Nose: mucosa pink and moist, septum midline; Throat/Mouth: + PND. Teeth present, mucosa pink and moist, no exudate noted, no lesions or ulcerations noted.  Cardiovascular: Normal rate and rhythm. S1,S2 noted.  No murmur, rubs or gallops noted.  Pulmonary/Chest: Normal effort and positive vesicular breath sounds. No respiratory distress. No wheezes, rales or ronchi noted.      Assessment & Plan:   Left ear pain, sore throat, likely viral  RST: negative Flonase 2 sprays each nostril for 3 days and then as needed. Ibuprofen for inflammation Rest and push fluids  RTC as needed or if symptoms persist.

## 2014-09-30 ENCOUNTER — Ambulatory Visit (INDEPENDENT_AMBULATORY_CARE_PROVIDER_SITE_OTHER): Payer: BC Managed Care – PPO | Admitting: Internal Medicine

## 2014-09-30 ENCOUNTER — Encounter: Payer: Self-pay | Admitting: Internal Medicine

## 2014-09-30 VITALS — BP 110/70 | HR 65 | Temp 98.1°F | Ht 66.0 in | Wt 117.5 lb

## 2014-09-30 DIAGNOSIS — Z8742 Personal history of other diseases of the female genital tract: Secondary | ICD-10-CM

## 2014-09-30 DIAGNOSIS — Z13220 Encounter for screening for lipoid disorders: Secondary | ICD-10-CM

## 2014-09-30 DIAGNOSIS — Z800 Family history of malignant neoplasm of digestive organs: Secondary | ICD-10-CM

## 2014-09-30 DIAGNOSIS — J029 Acute pharyngitis, unspecified: Secondary | ICD-10-CM

## 2014-09-30 DIAGNOSIS — N3020 Other chronic cystitis without hematuria: Secondary | ICD-10-CM

## 2014-09-30 DIAGNOSIS — Z1239 Encounter for other screening for malignant neoplasm of breast: Secondary | ICD-10-CM

## 2014-09-30 MED ORDER — FIRST-DUKES MOUTHWASH MT SUSP: OROMUCOSAL | Status: DC

## 2014-09-30 NOTE — Progress Notes (Signed)
Pre visit review using our clinic review tool, if applicable. No additional management support is needed unless otherwise documented below in the visit note. 

## 2014-09-30 NOTE — Progress Notes (Signed)
Subjective:    Patient ID: Rose Bernard, female    DOB: 1966/09/09, 48 y.o.   MRN: [id number]  HPI 48 year old female with past history or frequent uti's who comes in today to follow up on these issues as well as for a complete physical exam.  She had bladder surgery 10/31/12.  Seeing Dr Jacqlyn Larsen.  Receiving botox injections.  Not "leaking" like she was.  Continues follow up with Dr Jacqlyn Larsen.  Had seen Dr Tiffany Kocher.  Had colonoscopy in 2014.   Was evaluated at [facility].  Had been having problems with her right great toe and left fifth toe.  Had surgery.  Doing better.  Saw Dr Paulla Dolly.  No sob.  No chest pain or tightness.  She does report sore throat. Just started.  No fever.     Past Medical History  Diagnosis Date  . Frequent UTI     Review of Systems Patient denies any significant headache, lightheadedness or dizziness.  Sore throat.  No chest pain, tightness or palpitations. No increased shortness of breath, cough or congestion.  No nausea or vomiting.  No acid reflux.  No abdominal pain or cramping.  No bowel change, such as diarrhea, constipation, BRBPR or melana.  No urine change.  Overall bladder appears to be doing better.  S/p foot surgery.  Following with Dr Paulla Dolly.       Objective:   Physical Exam  Filed Vitals:   09/30/14 1534  BP: 110/70  Pulse: 65  Temp: 98.1 F (36.7 C)   Blood pressure recheck:  38/24  48 year old female in no acute distress.   HEENT:  Nares- clear.  Oropharynx - without lesions. NECK:  Supple.  Nontender.  No audible bruit.  HEART:  Appears to be regular. LUNGS:  No crackles or wheezing audible.  Respirations even and unlabored.  RADIAL PULSE:  Equal bilaterally.    BREASTS:  No nipple discharge or nipple retraction present.  Could not appreciate any distinct nodules or axillary adenopathy.  ABDOMEN:  Soft, nontender.  Bowel sounds present and normal.  No audible abdominal bruit.  GU:  Not performed.    EXTREMITIES:  No increased edema present.   DP pulses palpable and equal bilaterally.           Assessment & Plan:  HISTORY OF ABNORMAL MAMMOGRAM.  Saw Dr Bary Castilla.  Mammogram 12/12/11 - Birads II.  Had mammogram 1/14 and 1/15 - ok.  Mammogram 12/19/13 - Birads I.    HEALTH MAINTENANCE.  Physical today.   Colonoscopy in the spring 2014.  Results as outlined.   Mammogram 12/19/13- Birads I   Problem List Items Addressed This Visit   Chronic cystitis - Primary     Seeing Dr Jacqlyn Larsen.  Had bladder surgery.  S/p botox injections.   Currently doing better.      Relevant Orders      Throat culture [facility] Loop) (Completed)   Family history of colon cancer     Colonoscopy 02/13/13 - internal hemorrhoids otherwise normal.  Repeat five years.       Relevant Orders      Throat culture [facility]ell Loop) (Completed)   History of ovarian cyst     Was being followed by Dr Rayford Halsted.  Then was following with Dr Bayard MalesFredonia Highland.  Currently stable.      Sore throat     Dukes mouthwash as directed.  Check throat culture.  Notify me if symptoms change or worsen.  Relevant Orders      Throat culture [facility]ell Loop) (Completed)    Other Visit Diagnoses   Breast cancer screening        Relevant Orders       MM DIGITAL SCREENING BILATERAL       Throat culture ([facility]) (Completed)      I spent 25 minutes with the patient and more than 50% of the time was spent in consultation regarding the above.

## 2014-09-30 NOTE — Patient Instructions (Signed)
Saline nasal spray - flush nose at least 2-3x/day  flonase nasal spray - 2 sprays each nostril one time per day

## 2014-10-01 ENCOUNTER — Other Ambulatory Visit: Payer: Self-pay | Admitting: Internal Medicine

## 2014-10-01 ENCOUNTER — Other Ambulatory Visit: Payer: Self-pay | Admitting: *Deleted

## 2014-10-01 MED ORDER — FIRST-DUKES MOUTHWASH MT SUSP: OROMUCOSAL | Status: DC

## 2014-10-02 LAB — CULTURE, GROUP A STREP: Organism ID, Bacteria: NORMAL

## 2014-10-05 ENCOUNTER — Encounter: Payer: Self-pay | Admitting: Internal Medicine

## 2014-10-05 DIAGNOSIS — J029 Acute pharyngitis, unspecified: Secondary | ICD-10-CM | POA: Insufficient documentation

## 2014-10-05 NOTE — Assessment & Plan Note (Signed)
Dukes mouthwash as directed.  Check throat culture.  Notify me if symptoms change or worsen.

## 2014-10-05 NOTE — Assessment & Plan Note (Signed)
Colonoscopy 02/13/13 - internal hemorrhoids otherwise normal.  Repeat five years.

## 2014-10-05 NOTE — Assessment & Plan Note (Signed)
Was being followed by Dr Rayford Halsted.  Then was following with Dr Bayard MalesFredonia Highland.  Currently stable.

## 2014-10-05 NOTE — Assessment & Plan Note (Signed)
Seeing Dr Jacqlyn Larsen.  Had bladder surgery.  S/p botox injections.   Currently doing better.

## 2014-10-16 ENCOUNTER — Other Ambulatory Visit (INDEPENDENT_AMBULATORY_CARE_PROVIDER_SITE_OTHER): Payer: BC Managed Care – PPO

## 2014-10-16 DIAGNOSIS — Z13220 Encounter for screening for lipoid disorders: Secondary | ICD-10-CM

## 2014-10-16 DIAGNOSIS — J029 Acute pharyngitis, unspecified: Secondary | ICD-10-CM

## 2014-10-16 DIAGNOSIS — N3020 Other chronic cystitis without hematuria: Secondary | ICD-10-CM

## 2014-10-16 LAB — CBC WITH DIFFERENTIAL/PLATELET
Basophils Absolute: 0 10*3/uL (ref 0.0–0.1)
Basophils Relative: 1.1 % (ref 0.0–3.0)
EOS ABS: 0.1 10*3/uL (ref 0.0–0.7)
Eosinophils Relative: 2.4 % (ref 0.0–5.0)
HEMATOCRIT: 38.2 % (ref 36.0–46.0)
HEMOGLOBIN: 12.7 g/dL (ref 12.0–15.0)
LYMPHS ABS: 1.5 10*3/uL (ref 0.7–4.0)
Lymphocytes Relative: 44.4 % (ref 12.0–46.0)
MCHC: 33.2 g/dL (ref 30.0–36.0)
MCV: 91.4 fl (ref 78.0–100.0)
MONO ABS: 0.4 10*3/uL (ref 0.1–1.0)
MONOS PCT: 12.3 % — AB (ref 3.0–12.0)
NEUTROS ABS: 1.3 10*3/uL — AB (ref 1.4–7.7)
Neutrophils Relative %: 39.8 % — ABNORMAL LOW (ref 43.0–77.0)
Platelets: 254 10*3/uL (ref 150.0–400.0)
RBC: 4.18 Mil/uL (ref 3.87–5.11)
RDW: 13.4 % (ref 11.5–15.5)
WBC: 3.4 10*3/uL — ABNORMAL LOW (ref 4.0–10.5)

## 2014-10-16 LAB — LIPID PANEL
CHOL/HDL RATIO: 3
Cholesterol: 180 mg/dL (ref 0–200)
HDL: 55.8 mg/dL (ref >39.00)
LDL Cholesterol: 115 mg/dL — ABNORMAL HIGH (ref 0–99)
NONHDL: 124.2
TRIGLYCERIDES: 46 mg/dL (ref 0.0–149.0)
VLDL: 9.2 mg/dL (ref 0.0–40.0)

## 2014-10-16 LAB — COMPREHENSIVE METABOLIC PANEL
ALK PHOS: 52 U/L (ref 39–117)
ALT: 13 U/L (ref 0–35)
AST: 13 U/L (ref 0–37)
Albumin: 3.4 g/dL — ABNORMAL LOW (ref 3.5–5.2)
BUN: 14 mg/dL (ref 6–23)
CO2: 24 mEq/L (ref 19–32)
CREATININE: 0.6 mg/dL (ref 0.4–1.2)
Calcium: 8.8 mg/dL (ref 8.4–10.5)
Chloride: 105 mEq/L (ref 96–112)
GFR: 106.86 mL/min (ref >60.00)
Glucose, Bld: 78 mg/dL (ref 70–99)
Potassium: 3.8 mEq/L (ref 3.5–5.1)
SODIUM: 140 meq/L (ref 135–145)
TOTAL PROTEIN: 6.9 g/dL (ref 6.0–8.3)
Total Bilirubin: 0.7 mg/dL (ref 0.2–1.2)

## 2014-10-16 LAB — TSH: TSH: 1.42 u[IU]/mL (ref 0.35–4.50)

## 2014-10-17 ENCOUNTER — Other Ambulatory Visit: Payer: Self-pay | Admitting: Internal Medicine

## 2014-10-17 ENCOUNTER — Encounter: Payer: Self-pay | Admitting: *Deleted

## 2014-10-17 DIAGNOSIS — D72819 Decreased white blood cell count, unspecified: Secondary | ICD-10-CM

## 2014-10-17 NOTE — Progress Notes (Signed)
Order placed for f/u cbc.   

## 2014-11-04 ENCOUNTER — Other Ambulatory Visit (INDEPENDENT_AMBULATORY_CARE_PROVIDER_SITE_OTHER): Payer: BC Managed Care – PPO

## 2014-11-04 ENCOUNTER — Other Ambulatory Visit: Payer: BC Managed Care – PPO

## 2014-11-04 DIAGNOSIS — D72819 Decreased white blood cell count, unspecified: Secondary | ICD-10-CM

## 2014-11-05 LAB — CBC WITH DIFFERENTIAL/PLATELET
BASOS PCT: 0.5 % (ref 0.0–3.0)
Basophils Absolute: 0 10*3/uL (ref 0.0–0.1)
EOS PCT: 3 % (ref 0.0–5.0)
Eosinophils Absolute: 0.1 10*3/uL (ref 0.0–0.7)
HCT: 37.4 % (ref 36.0–46.0)
HEMOGLOBIN: 12.4 g/dL (ref 12.0–15.0)
Lymphocytes Relative: 36.4 % (ref 12.0–46.0)
Lymphs Abs: 1.8 10*3/uL (ref 0.7–4.0)
MCHC: 33.1 g/dL (ref 30.0–36.0)
MCV: 91.4 fl (ref 78.0–100.0)
MONO ABS: 0.5 10*3/uL (ref 0.1–1.0)
Monocytes Relative: 9.2 % (ref 3.0–12.0)
NEUTROS ABS: 2.5 10*3/uL (ref 1.4–7.7)
NEUTROS PCT: 50.9 % (ref 43.0–77.0)
Platelets: 216 10*3/uL (ref 150.0–400.0)
RBC: 4.09 Mil/uL (ref 3.87–5.11)
RDW: 13.7 % (ref 11.5–15.5)
WBC: 5 10*3/uL (ref 4.0–10.5)

## 2014-11-28 ENCOUNTER — Encounter: Payer: Self-pay | Admitting: Internal Medicine

## 2014-11-28 ENCOUNTER — Telehealth: Payer: Self-pay | Admitting: Internal Medicine

## 2014-11-28 ENCOUNTER — Ambulatory Visit (INDEPENDENT_AMBULATORY_CARE_PROVIDER_SITE_OTHER): Payer: BC Managed Care – PPO | Admitting: Internal Medicine

## 2014-11-28 VITALS — BP 110/70 | HR 68 | Temp 97.5°F | Ht 66.0 in | Wt 117.0 lb

## 2014-11-28 DIAGNOSIS — N390 Urinary tract infection, site not specified: Secondary | ICD-10-CM

## 2014-11-28 LAB — POCT URINALYSIS DIPSTICK
Bilirubin, UA: NEGATIVE
Blood, UA: NEGATIVE
Glucose, UA: NEGATIVE
KETONES UA: NEGATIVE
Nitrite, UA: NEGATIVE
PROTEIN UA: NEGATIVE
Spec Grav, UA: 1.02
Urobilinogen, UA: 0.2
pH, UA: 5.5

## 2014-11-28 MED ORDER — CIPROFLOXACIN HCL 500 MG PO TABS: 500.0000 mg | ORAL_TABLET | Freq: Two times a day (BID) | ORAL | Status: DC

## 2014-11-28 NOTE — Progress Notes (Signed)
Pre visit review using our clinic review tool, if applicable. No additional management support is needed unless otherwise documented below in the visit note. 

## 2014-11-28 NOTE — Telephone Encounter (Signed)
?  UTI needing an appt today or come in for just labs.

## 2014-11-28 NOTE — Telephone Encounter (Signed)
Noted  

## 2014-11-28 NOTE — Telephone Encounter (Signed)
Pt was told to come now & give a urine specimen. Then she will be seen for a quick visit.

## 2014-12-01 ENCOUNTER — Encounter: Payer: Self-pay | Admitting: Internal Medicine

## 2014-12-01 LAB — URINE CULTURE: Colony Count: >=100000

## 2014-12-01 NOTE — Progress Notes (Signed)
  Subjective:    Patient ID: Rose Bernard, female    DOB: 07/16/1966, 48 y.o.   MRN: [id number]  Urinary Tract Infection   48 year old female with past history or frequent uti's who comes in today as a work in with concerns regarding a possible urinary tract infection.   She had bladder surgery 10/31/12.  Seeing Dr Jacqlyn Larsen.  Receiving botox injections.  Not "leaking" like she was.  Continues follow up with Dr Jacqlyn Larsen. States she could not see him today.  Started having problems with increased frequency and odor over the past couple of days.  No hematuria.  Some dysuria.  Symptoms c/w her previous bladder infections.  No vaginal symptoms.  Eating and drinking ok.      Past Medical History  Diagnosis Date  . Frequent UTI     Review of Systems reports urinary symptoms as outlined.  No nausea or vomiting.  No significant abdominal pain or cramping.  No bowel change, such as diarrhea.  No vaginal symptoms.  Eating and drinking.        Objective:   Physical Exam  Filed Vitals:   11/28/14 1615  BP: 110/70  Pulse: 68  Temp: 97.5 F (68.56 C)   48 year old female in no acute distress.  HEART:  Appears to be regular. LUNGS:  No crackles or wheezing audible.  Respirations even and unlabored.  ABDOMEN:  Soft, nontender.  Bowel sounds present and normal.   BACK:  Non tender.  No CVA tenderness.            Assessment & Plan:   Problem List Items Addressed This Visit    UTI (urinary tract infection) - Primary   Relevant Orders      POCT urinalysis dipstick (Completed)      Urine culture (Completed)     Urine dip c/w a bladder infection.  Send culture.  Start her on cipro 500mg  bid.  Await culture results.  Stay hydrated.  Follow.

## 2014-12-03 ENCOUNTER — Ambulatory Visit: Payer: BC Managed Care – PPO | Admitting: Internal Medicine

## 2014-12-10 ENCOUNTER — Ambulatory Visit (INDEPENDENT_AMBULATORY_CARE_PROVIDER_SITE_OTHER): Payer: BC Managed Care – PPO | Admitting: Internal Medicine

## 2014-12-10 ENCOUNTER — Encounter: Payer: Self-pay | Admitting: Internal Medicine

## 2014-12-10 VITALS — BP 102/64 | HR 69 | Temp 97.9°F | Wt 116.0 lb

## 2014-12-10 DIAGNOSIS — R300 Dysuria: Secondary | ICD-10-CM

## 2014-12-10 DIAGNOSIS — N3020 Other chronic cystitis without hematuria: Secondary | ICD-10-CM

## 2014-12-10 LAB — POCT URINALYSIS DIPSTICK
Bilirubin, UA: NEGATIVE
Blood, UA: NEGATIVE
Glucose, UA: NEGATIVE
Ketones, UA: NEGATIVE
LEUKOCYTES UA: NEGATIVE
Nitrite, UA: NEGATIVE
PH UA: 6.5
Protein, UA: NEGATIVE
Spec Grav, UA: 1.025
UROBILINOGEN UA: NEGATIVE

## 2014-12-10 MED ORDER — SULFAMETHOXAZOLE-TRIMETHOPRIM 800-160 MG PO TABS: 1.0000 | ORAL_TABLET | Freq: Two times a day (BID) | ORAL | Status: DC

## 2014-12-10 NOTE — Progress Notes (Signed)
HPI  Pt presents to the clinic today with c/o dysuria. She reports that she was seen 11/28/14 for the same. She was started on Cipro for UTI. Culture grew out E Coli, Cipro sensitive. She reports that she finished the medication 12/05/14. She continues to have flank pain and dysuria. She denies fever, chills or nausea. She has a issue with chronic UTI. She does have a urologist.   Review of Systems  Past Medical History  Diagnosis Date  . Frequent UTI     Family History  Problem Relation Age of Onset  . Colon cancer Father     With ostomy in place   . Breast cancer Maternal Aunt     History   Social History  . Marital Status: Married    Spouse Name: N/A    Number of Children: 2  . Years of Education: N/A   Occupational History  . Not on file.   Social History Main Topics  . Smoking status: Never Smoker   . Smokeless tobacco: Never Used  . Alcohol Use: No  . Drug Use: No  . Sexual Activity: Not on file   Other Topics Concern  . Not on file   Social History Narrative    Allergies  Allergen Reactions  . Penicillin V Potassium     Constitutional: Denies fever, malaise, fatigue, headache or abrupt weight changes.   GU: Pt reports urgency, frequency and pain with urination. Denies burning sensation, blood in urine, odor or discharge. Skin: Denies redness, rashes, lesions or ulcercations.   No other specific complaints in a complete review of systems (except as listed in HPI above).    Objective:   Physical Exam  BP 102/64 mmHg  Pulse 69  Temp(Src) 97.9 F (36.6 C) (Oral)  Wt 116 lb (52.617 kg)  SpO2 99%  LMP 02/07/1994 Wt Readings from Last 3 Encounters:  12/10/14 116 lb (52.617 kg)  11/28/14 117 lb (53.071 kg)  09/30/14 117 lb 8 oz (53.298 kg)    General: Appears her stated age, well developed, well nourished in NAD. Cardiovascular: Normal rate and rhythm. S1,S2 noted.  No murmur, rubs or gallops noted.  Pulmonary/Chest: Normal effort and positive  vesicular breath sounds. No respiratory distress. No wheezes, rales or ronchi noted.  Abdomen: Soft. Normal bowel sounds, no bruits noted. No distention or masses noted. Liver, spleen and kidneys non palpable. Tender to palpation over the bladder area. No CVA tenderness.      Assessment & Plan:   Urgency, Frequency, Dysuria secondary to Chronic Cystitis  Urinalysis: normal Will send urine culture She reports her urine is often normal but her culture grows out bacteria Given the holiday and weekend coming up will give Rx for Septra BID x 7 days (if culture comes back negative will stop) OK to take AZO OTC Drink plenty of fluids  Follow up with your urologist as needed or if symptoms persist.

## 2014-12-10 NOTE — Progress Notes (Signed)
Pre visit review using our clinic review tool, if applicable. No additional management support is needed unless otherwise documented below in the visit note. 

## 2014-12-10 NOTE — Patient Instructions (Signed)

## 2014-12-10 NOTE — Addendum Note (Signed)
Addended by: Lurlean Nanny on: 12/10/2014 04:20 PM   Modules accepted: Orders

## 2014-12-12 LAB — URINE CULTURE
COLONY COUNT: NO GROWTH
Organism ID, Bacteria: NO GROWTH

## 2014-12-22 ENCOUNTER — Ambulatory Visit: Payer: Self-pay | Admitting: Internal Medicine

## 2014-12-22 LAB — HM MAMMOGRAPHY

## 2014-12-23 ENCOUNTER — Encounter: Payer: Self-pay | Admitting: Internal Medicine

## 2015-01-09 ENCOUNTER — Ambulatory Visit: Payer: Self-pay | Admitting: Internal Medicine

## 2015-01-09 ENCOUNTER — Encounter: Payer: Self-pay | Admitting: Internal Medicine

## 2015-01-09 LAB — HM MAMMOGRAPHY

## 2015-03-31 NOTE — Op Note (Signed)
PATIENT NAME:  Rose Bernard, Rose Bernard MR#:  [id number] DATE OF BIRTH:  Dec 27, 1965  DATE OF PROCEDURE:  10/30/2012  PREOPERATIVE DIAGNOSES:  1. Urinary frequency. 2. Detrusor instability. 3. Urge incontinence.   POSTOPERATIVE DIAGNOSES: 1. Urinary frequency. 2. Detrusor instability. 3. Urge incontinence.   PROCEDURES:  1. Cystoscopy. 2. Bladder Botox injection.   SURGEON: Denice Bors. Jacqlyn Larsen, MD    ANESTHESIA: Laryngeal mask airway anesthesia.   INDICATIONS: The patient is a 49 year old white female with a history of previous bladder injury which ureteral reimplantation. She has had refractory overactive bladder with frequency, urgency, and urge component incontinence that has not responded to conservative measures. She has undergone previous bladder Botox injection with excellent response. She has started having her recurrent symptoms. She presents today for repeat bladder Botox injection.   PROCEDURE: After informed consent was obtained, the patient was taken to the operating room and placed in the dorsal lithotomy position under laryngeal mask airway anesthesia. The patient was then prepped and draped in the usual standard fashion. The 56 French rigid cystoscope was introduced into the urethra under direct vision with no urethral abnormalities noted. Upon entering the bladder, the mucosa was inspected in its entirety with no gross mucosal lesions noted. The left ureteral orifice was slightly deformed due to prior surgery. The right ureteral orifice was noted to be posterolateral consistent with reimplantation. Some scar was present on the bladder base. She was noted to have no other significant abnormalities. The Botox was reconstituted with 10 mL of sterile injectable saline. 100 units of the material were utilized. The Botox needle was then placed through the scope. Five rows of four injections were placed starting on the posterior bladder base and along the posterior bladder wall each approximately 1  cm apart. No significant bleeding was encountered. Standard technique was utilized for the injections. Upon completion, the bladder was drained, the cystoscope was removed. The patient was returned to the supine position and awakened from laryngeal mask airway anesthesia. She was taken to the recovery room in stable condition. There were no problems or complications. The patient tolerated the procedure well.   ____________________________ Denice Bors. Jacqlyn Larsen, MD bsc:drc D: 10/30/2012 08:03:31 ET T: 10/30/2012 09:24:36 ET JOB#: [id number]  cc: Denice Bors. Jacqlyn Larsen, MD, <Dictator> Denice Bors Denham Mose MD ELECTRONICALLY SIGNED 11/01/2012 21:45

## 2015-04-02 ENCOUNTER — Encounter: Payer: Self-pay | Admitting: Internal Medicine

## 2015-04-02 ENCOUNTER — Ambulatory Visit (INDEPENDENT_AMBULATORY_CARE_PROVIDER_SITE_OTHER): Payer: Self-pay | Admitting: Internal Medicine

## 2015-04-02 VITALS — BP 102/68 | HR 57 | Temp 98.5°F | Ht 66.0 in | Wt 116.0 lb

## 2015-04-02 DIAGNOSIS — N3000 Acute cystitis without hematuria: Secondary | ICD-10-CM

## 2015-04-02 DIAGNOSIS — R102 Pelvic and perineal pain: Secondary | ICD-10-CM

## 2015-04-02 DIAGNOSIS — N9489 Other specified conditions associated with female genital organs and menstrual cycle: Secondary | ICD-10-CM

## 2015-04-02 DIAGNOSIS — R8290 Unspecified abnormal findings in urine: Secondary | ICD-10-CM

## 2015-04-02 DIAGNOSIS — R928 Other abnormal and inconclusive findings on diagnostic imaging of breast: Secondary | ICD-10-CM

## 2015-04-02 DIAGNOSIS — Z800 Family history of malignant neoplasm of digestive organs: Secondary | ICD-10-CM

## 2015-04-02 DIAGNOSIS — Z0000 Encounter for general adult medical examination without abnormal findings: Secondary | ICD-10-CM

## 2015-04-02 DIAGNOSIS — N3020 Other chronic cystitis without hematuria: Secondary | ICD-10-CM

## 2015-04-02 LAB — URINALYSIS, ROUTINE W REFLEX MICROSCOPIC
Bilirubin Urine: NEGATIVE
Hgb urine dipstick: NEGATIVE
Ketones, ur: NEGATIVE
Leukocytes, UA: NEGATIVE
Nitrite: NEGATIVE
RBC / HPF: NONE SEEN (ref 0–?)
SPECIFIC GRAVITY, URINE: 1.01 (ref 1.000–1.030)
TOTAL PROTEIN, URINE-UPE24: NEGATIVE
URINE GLUCOSE: NEGATIVE
Urobilinogen, UA: 0.2 (ref 0.0–1.0)
pH: 5.5 (ref 5.0–8.0)

## 2015-04-02 NOTE — Progress Notes (Signed)
Patient ID: Rose Bernard, female   DOB: 20-Jun-1966, 49 y.o.   MRN: [id number]   Subjective:    Patient ID: Rose Bernard, female    DOB: 22-Mar-1966, 49 y.o.   MRN: [id number]  HPI  Patient here for a scheduled follow up.  Was evaluated by dermatology.  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  Due f/u 05/18/15.  She is due to see Dr Jacqlyn Larsen next week.  She has noticed some "spasm".  Was questioning if had another urinary tract infection.  Similar symptoms.  Previously had botox.  Last 07/2014.  Eating and drinking well.  No light headedness or dizziness.  No cardiac symptoms with increased activity or exertion.    Past Medical History  Diagnosis Date  . Frequent UTI     Review of Systems  Constitutional: Negative for appetite change and unexpected weight change.  HENT: Negative for congestion and sinus pressure.   Respiratory: Negative for cough, chest tightness and shortness of breath.   Cardiovascular: Negative for chest pain, palpitations and leg swelling.  Gastrointestinal: Negative for nausea, vomiting, abdominal pain and diarrhea.  Genitourinary: Negative for vaginal discharge.       Some spasm.  Some discomfort with urination.   Musculoskeletal: Negative for back pain and joint swelling.  Skin: Negative for color change and rash.  Neurological: Negative for dizziness, light-headedness and headaches.  Psychiatric/Behavioral: Negative for dysphoric mood and agitation.       Objective:     Blood pressure recheck:  102/68  Physical Exam  Constitutional: She appears well-developed and well-nourished. No distress.  HENT:  Nose: Nose normal.  Mouth/Throat: Oropharynx is clear and moist.  Neck: Neck supple. No thyromegaly present.  Cardiovascular: Normal rate and regular rhythm.   Pulmonary/Chest: Breath sounds normal. No respiratory distress. She has no wheezes.  Abdominal: Soft. Bowel sounds are normal. There is no tenderness.  Musculoskeletal: She exhibits no edema or  tenderness.  Lymphadenopathy:    She has no cervical adenopathy.  Skin: No rash noted. No erythema.  Psychiatric: She has a normal mood and affect. Her behavior is normal.    BP 102/68 mmHg  Pulse 57  Temp(Src) 98.5 F (36.9 C) (Oral)  Ht 5\' 6"  (1.676 m)  Wt 116 lb (52.617 kg)  BMI 18.73 kg/m2  SpO2 99%  LMP 02/07/1994 Wt Readings from Last 3 Encounters:  04/02/15 116 lb (52.617 kg)  12/10/14 116 lb (52.617 kg)  11/28/14 117 lb (53.071 kg)     Lab Results  Component Value Date   WBC 5.0 11/04/2014   HGB 12.4 11/04/2014   HCT 37.4 11/04/2014   PLT 216.0 11/04/2014   GLUCOSE 78 10/16/2014   CHOL 180 10/16/2014   TRIG 46.0 10/16/2014   HDL 55.80 10/16/2014   LDLCALC 115* 10/16/2014   ALT 13 10/16/2014   AST 13 10/16/2014   NA 140 10/16/2014   K 3.8 10/16/2014   CL 105 10/16/2014   CREATININE 0.6 10/16/2014   BUN 14 10/16/2014   CO2 24 10/16/2014   TSH 1.42 10/16/2014       Assessment & Plan:   Problem List Items Addressed This Visit    Abnormal mammogram    Mammogram 12/22/14 - screening mammogram recommended f/u views (right breast).  Right breast mammogram 01/09/15 - birads III.  Recommended f/u right breast mammogram in 6 months.  Schedule f/u mammogram.        Relevant Orders   MM Digital Diagnostic Unilat R   MM  Digital Diagnostic Unilat R   Chronic cystitis - Primary    Seeing Dr Jacqlyn Larsen.  Had bladder surgery.  S/p botox.  Due to f/u next week.        Family history of colon cancer    Colonoscopy 02/13/13 - internal hemorrhoids otherwise normal.  Repeat colonoscopy in five years.        Health care maintenance    Physical 09/30/14.  Colonoscopy 02/13/13 - internal hemorrhoids otherwise normal.  Recommend f/u colonoscopy in five years.  Mammogram as outlined.        UTI (urinary tract infection)    Concern over possible uti.  Check urinalysis and urine culture.  Hold on abx until results available.         Other Visit Diagnoses    Pelvic pressure in  female        Relevant Orders    Urinalysis, Routine w reflex microscopic (Completed)    CULTURE, URINE COMPREHENSIVE (Completed)    Bad odor of urine        Relevant Orders    Urinalysis, Routine w reflex microscopic (Completed)    CULTURE, URINE COMPREHENSIVE (Completed)        Einar Pheasant, MD

## 2015-04-02 NOTE — Progress Notes (Signed)
Pre visit review using our clinic review tool, if applicable. No additional management support is needed unless otherwise documented below in the visit note. 

## 2015-04-05 LAB — CULTURE, URINE COMPREHENSIVE: Colony Count: >=100000

## 2015-04-05 NOTE — Op Note (Signed)
PATIENT NAME:  Rose Bernard, Rose Bernard MR#:  [id number] DATE OF BIRTH:  08-01-1966  DATE OF PROCEDURE:  03/06/2012  PREOPERATIVE DIAGNOSIS: Urinary frequency, detrusor instability, urge incontinence.   POSTOPERATIVE DIAGNOSIS: Urinary frequency, detrusor instability, urge incontinence.   PROCEDURES:  1. Cystoscopy.  2. Botox injection.   SURGEON: Edrick Oh, M.D.   ANESTHESIA: Laryngeal mask airway anesthesia.   INDICATIONS: The patient is a 49 year old white female with a history of previous bladder injury and ureteral reimplantation. She has significant reflux into the reimplanted right ureteral orifice. She has had frequent recurrent bladder infections with significant urinary frequency, urgency, and urge incontinence. She has been refractory to all oral medications thus far. She presents for cystoscopy and Botox injection.   DESCRIPTION OF PROCEDURE: After informed consent was obtained, the patient was taken to the operating room and placed in the dorsal lithotomy position under laryngeal mask airway anesthesia. The patient was then prepped and draped in the usual standard fashion. The 21 French rigid cystoscope was introduced into the urethra under direct vision with no urethral abnormalities noted. Upon entering the bladder, the right ureteral orifice was noted to be surgically absent. A significant amount of scar was noted along the bladder base and trigone region. The left ureteral orifice was pinpoint, but in normal position. The reimplanted ureter is noted on the right posterior mid lateral bladder wall. Botox 100 units was reconstituted in 10 mL of injectable saline. The first injection was at the posterior right bladder base. Approximately five injections were performed across the posterior bladder base. A second row was undertaken with five injections. The third and fourth row were only three injections to minimize the proximity to the reimplanted ureter. The remaining injections were placed  above the level of the reimplant. No significant bleeding was encountered. The bladder was drained. The cystoscope     was removed. The patient was returned to the supine position and awakened from laryngeal mask airway anesthesia. She was taken to the recovery room in stable condition. There were no problems or complications. The patient tolerated the procedure well.  ____________________________ Denice Bors. Jacqlyn Larsen, MD bsc:slb D: 03/06/2012 08:09:01 ET T: 03/06/2012 11:20:50 ET JOB#: [id number]  cc: Denice Bors. Jacqlyn Larsen, MD, <Dictator> Denice Bors Marielena Harvell MD ELECTRONICALLY SIGNED 03/06/2012 19:34

## 2015-04-06 ENCOUNTER — Other Ambulatory Visit: Payer: Self-pay | Admitting: *Deleted

## 2015-04-06 MED ORDER — DOXYCYCLINE HYCLATE 100 MG PO TABS: 100.0000 mg | ORAL_TABLET | Freq: Two times a day (BID) | ORAL | Status: DC

## 2015-04-12 ENCOUNTER — Encounter: Payer: Self-pay | Admitting: Internal Medicine

## 2015-04-12 DIAGNOSIS — R928 Other abnormal and inconclusive findings on diagnostic imaging of breast: Secondary | ICD-10-CM | POA: Insufficient documentation

## 2015-04-12 DIAGNOSIS — Z0000 Encounter for general adult medical examination without abnormal findings: Secondary | ICD-10-CM | POA: Insufficient documentation

## 2015-04-12 NOTE — Assessment & Plan Note (Signed)
Concern over possible uti.  Check urinalysis and urine culture.  Hold on abx until results available.

## 2015-04-12 NOTE — Assessment & Plan Note (Signed)
Colonoscopy 02/13/13 - internal hemorrhoids otherwise normal.  Repeat colonoscopy in five years.

## 2015-04-12 NOTE — Assessment & Plan Note (Signed)
Physical 09/30/14.  Colonoscopy 02/13/13 - internal hemorrhoids otherwise normal.  Recommend f/u colonoscopy in five years.  Mammogram as outlined.

## 2015-04-12 NOTE — Assessment & Plan Note (Signed)
Mammogram 12/22/14 - screening mammogram recommended f/u views (right breast).  Right breast mammogram 01/09/15 - birads III.  Recommended f/u right breast mammogram in 6 months.  Schedule f/u mammogram.

## 2015-04-12 NOTE — Assessment & Plan Note (Signed)
Seeing Dr Jacqlyn Larsen.  Had bladder surgery.  S/p botox.  Due to f/u next week.

## 2015-05-06 ENCOUNTER — Encounter: Payer: Self-pay | Admitting: Internal Medicine

## 2015-05-18 ENCOUNTER — Encounter: Payer: Self-pay | Admitting: Internal Medicine

## 2015-05-19 ENCOUNTER — Telehealth: Payer: Self-pay | Admitting: Internal Medicine

## 2015-05-19 NOTE — Telephone Encounter (Signed)
Pt dropped off FMLA paper work. Paper work in Dr. Bary Leriche box/msn

## 2015-05-19 NOTE — Telephone Encounter (Signed)
Placed in red folder  

## 2015-05-20 DIAGNOSIS — Z7689 Persons encountering health services in other specified circumstances: Secondary | ICD-10-CM

## 2015-05-20 NOTE — Telephone Encounter (Signed)
Form completed and placed in your basket.

## 2015-05-21 NOTE — Telephone Encounter (Signed)
FMLA completed & pt notified. Forms placed up front & copy made for scan.

## 2015-06-09 ENCOUNTER — Other Ambulatory Visit: Payer: Self-pay | Admitting: Internal Medicine

## 2015-06-09 DIAGNOSIS — R928 Other abnormal and inconclusive findings on diagnostic imaging of breast: Secondary | ICD-10-CM

## 2015-06-09 NOTE — Progress Notes (Signed)
Order placed for right breast ultrasound.  

## 2015-07-10 ENCOUNTER — Ambulatory Visit
Admission: RE | Admit: 2015-07-10 | Discharge: 2015-07-10 | Disposition: A | Discharge status: Home / Self Care | Payer: BLUE CROSS/BLUE SHIELD | Source: Ambulatory Visit | Attending: Internal Medicine | Admitting: Internal Medicine

## 2015-07-10 ENCOUNTER — Ambulatory Visit: Payer: Self-pay

## 2015-07-10 DIAGNOSIS — R921 Mammographic calcification found on diagnostic imaging of breast: Secondary | ICD-10-CM | POA: Insufficient documentation

## 2015-07-10 DIAGNOSIS — R928 Other abnormal and inconclusive findings on diagnostic imaging of breast: Secondary | ICD-10-CM

## 2015-07-12 ENCOUNTER — Other Ambulatory Visit: Payer: Self-pay | Admitting: Internal Medicine

## 2015-07-12 DIAGNOSIS — Z1239 Encounter for other screening for malignant neoplasm of breast: Secondary | ICD-10-CM

## 2015-07-12 NOTE — Progress Notes (Signed)
Order placed for screening mammogram - due 12/23/15

## 2015-07-13 ENCOUNTER — Encounter: Payer: Self-pay | Admitting: *Deleted

## 2015-09-02 LAB — BASIC METABOLIC PANEL
BUN: 15 mg/dL (ref 4–21)
Creatinine: 0.7 mg/dL (ref 0.5–1.1)

## 2015-09-03 ENCOUNTER — Encounter: Payer: Self-pay | Admitting: Internal Medicine

## 2015-09-07 DIAGNOSIS — R1032 Left lower quadrant pain: Secondary | ICD-10-CM | POA: Insufficient documentation

## 2015-09-16 DIAGNOSIS — M7918 Myalgia, other site: Secondary | ICD-10-CM | POA: Insufficient documentation

## 2015-09-16 DIAGNOSIS — G8929 Other chronic pain: Secondary | ICD-10-CM | POA: Insufficient documentation

## 2015-09-16 DIAGNOSIS — R252 Cramp and spasm: Secondary | ICD-10-CM | POA: Insufficient documentation

## 2015-09-16 DIAGNOSIS — R102 Pelvic and perineal pain: Secondary | ICD-10-CM

## 2015-09-23 ENCOUNTER — Ambulatory Visit: Payer: BLUE CROSS/BLUE SHIELD | Attending: Student | Admitting: Physical Therapy

## 2015-09-23 ENCOUNTER — Encounter: Payer: Self-pay | Admitting: Physical Therapy

## 2015-09-23 DIAGNOSIS — R278 Other lack of coordination: Secondary | ICD-10-CM

## 2015-09-23 DIAGNOSIS — Z7409 Other reduced mobility: Secondary | ICD-10-CM | POA: Diagnosis present

## 2015-09-23 DIAGNOSIS — R531 Weakness: Secondary | ICD-10-CM | POA: Insufficient documentation

## 2015-09-23 NOTE — Patient Instructions (Signed)
Scar massage: zig zag along both scars 5 min each night with olive/ coconut oil     Proper sitting posture, toileting technique with stool, and log rolling to decrease downward pressure on pelvic floor and abdominal mm, back pain                                               Preserve the function of your pelvic floor, abdomen, and back. Avoid decreased straining of abdominal/pelvic floor muscles with less slouching,  holding your breath with lifting/bowel movements)                                                     FUNCTIONAL POSTURES

## 2015-09-24 NOTE — Therapy (Signed)
[place] MAIN [facility] SERVICES [address] [place], [place], [address] Phone: [phone]   Fax:  [phone]  Physical Therapy Evaluation  Patient Details  Name: Rose Bernard MRN: [id number] Date of Birth: [birth year] No Data Recorded  Encounter Date: 09/23/2015      PT End of Session - 09/24/15 2119    Visit Number 1   Number of Visits 12   Date for PT Re-Evaluation 12/09/15   PT Start Time 1600   PT Stop Time 1710   PT Time Calculation (min) 70 min   Activity Tolerance Patient tolerated treatment well;No increased pain   Behavior During Therapy [facility] for tasks assessed/performed      Past Medical History  Diagnosis Date  . Frequent UTI     Past Surgical History  Procedure Laterality Date  . Breast surgery Bilateral 2002    benign mass removed  . Bladder botox    . Foot surgery Bilateral   . Breast biopsy Bilateral     neg  . Reconstruction of bladder   1995     2/2 delivery complications with 3rd child   . Appendectomy      2001 due to scar tissue  . Abdominal hysterectomy  1995    2/2 delivery complications with 3rd child. Hysterectomy and bladder repair with subsequent appendectomy. L Ovary remain in place     There were no vitals filed for this visit.  Visit Diagnosis:  Weakness generalized - Plan: PT plan of care cert/re-cert  Coordination impairment - Plan: PT plan of care cert/re-cert  Limited mobility - Plan: PT plan of care cert/re-cert      Subjective Assessment - 09/23/15 1611    Subjective 1) abdominal pain:  Pt reported her abdominal pain has occured intermittently since her pregnancy and delivery which involved multiple abdominal surgeries (hysterectomy, appendectomy, and removal of R ovary). The past 4 weeks, pt has experienced intense pain that has caused her to be "bent over".  Curerntly, pain is 5/10 with sitting in chair.  denied pain with intercourse. Pain occurs with sitting, standing, household  chores, and bowel movements. Bowel movement occurs daily to every other day. Pt reports sometimes straining for eliminating bowels and urine.  2)  Urinary leakage that occurs when she can 't get to the toilet in time, and with laughing, coughing sneezing, lifting or chasing grandson (30#) . Pt sometimes feel like she has completely voided but it turns out she has not.  Pt does not wear pads.  3) LBP medial area  where kidneys are located. See below for more details.     Pertinent History Every 9 months, pt received botox over bladder area in order to prevent closure of ureter and to decrease UTI. Prior to this treatment, pt was getting UTI  6-7x /year. Currently, pt does not have UTI infection according to her visit to see Dr. Jacqlyn Larsen , her urologist  2 weeks ago.     Limitations Sitting;Standing;Walking;House hold activities;Other (comment);Lifting   lifting grandson (30#)    How long can you sit comfortably? 20-30 min   How long can you stand comfortably? 5-10 mins    Patient Stated Goals get better    Currently in Pain? Yes   Pain Score 5    Pain Location Abdomen   Pain Orientation Left  bilateral    Multiple Pain Sites Yes   Pain Score 6   Pain Location Back   Pain Orientation Posterior   Pain  Descriptors / Indicators Radiating   Pain Type Chronic pain   Pain Radiating Towards anterior / posterior L thigh   Pain Onset More than a month ago   Aggravating Factors  sitting in chair, sidelying without radiating pain. Lifting causes radiating pain.    Pain Relieving Factors resting and heat pack             [facility] PT Assessment - 09/24/15 2116    Assessment   Medical Diagnosis abdominal pain   Precautions   Precautions None   Restrictions   Weight Bearing Restrictions No   Prior Function   Level of Independence Independent   Observation/Other Assessments   Other Surveys  --  Pain Disability Index 60%    Coordination   Gross Motor Movements are Fluid and Coordinated --   lumbopelvic instability with ALSR, decreased w/cueto exhale    Coordination and Movement Description limited pelvic floor mm w. external palpation. abdominal straining with cue for bowel movement   Posture/Postural Control   Posture Comments chest breathing/ diaphragmatic excursion limited, limited pelvic floor mm w. external palpation. upper abdominal mm activation with cue for pelvic contraction    Palpation   Spinal mobility pain with tenderness along lower ribcage with increased mm tensions bilaterally    Palpation comment significantly decreased fascial restrictions along horizontal and longitudinal scars over abdomen   Bed Mobility   Bed Mobility --  crunch technique, able to log roll                  Pelvic Floor Special Questions - 09/23/15 1718    Diastasis Recti 3 finger below sternum and above umbilicus           [facility] Adult PT Treatment/Exercise - 09/24/15 2116    Self-Care   Other Self-Care Comments  scar massage   Neuro Re-ed    Neuro Re-ed Details  movement patterns to decrease descent of pelvic floor organs, exhalation with sit to stand (3 reps)   guided log rolling, pt practice toileting posture    Manual Therapy   Manual therapy comments scar massage and guided patient                 PT Education - 09/23/15 1721    Education provided Yes   Education Details POC, anatomy/ physiology, goals, recommended finding out Catering manager) Educated Patient   Methods Explanation;Demonstration;Tactile cues;Verbal cues;Handout   Comprehension Verbalized understanding;Returned demonstration             PT Long Term Goals - 09/24/15 2125    PT LONG TERM GOAL #1   Title Pt will report a decrease on PDU from 60% to < 50% in order to return to ADLs.   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   Time 12   Period Weeks   Status New   PT LONG TERM GOAL #3   Title Pt will demo proper lifting mechanics with 50# from floor to counter 3 reps and activation of deep core without pain in order to lift grandchildren.   Time 12   Period Weeks   Status New   PT LONG TERM GOAL #4   Title Pt will demo decreased scar immobility along  horizontal. longitudinal abdominal scars in order to progress to deep core strengthening.    Time 12   Period Weeks   Status New               Plan - 09/24/15 2120    Clinical Impression Statement Pt is a 49 yo female whose S & Sx consist of abdominal/ back pain and urinary leakage, significant scar immobility along abdomen, diastasis recti ,  limited spinal mobility, rounded shoulders, poor coordination and strength of deep core mm, poor sitting, standing, and toileting posture. These deficits limit her from picking up her grandson, sitting, and work tasks such as pulling and pushing.    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 Abnormal mammogram 04/12/2015  . Health care maintenance 04/12/2015  . Sore throat 10/05/2014  . Sacral pain 12/29/2013  . Sinus congestion 12/29/2013  . Rib pain 09/29/2013  . UTI (urinary tract infection) 02/27/2013  . Family history of colon cancer 02/20/2013  . Headache(784.0) 02/11/2013  . Chronic cystitis 02/11/2013  . History of ovarian cyst 02/11/2013    Jerl Mina  ,PT, DPT, E-RYT  09/24/2015, 9:35 PM  [place] MAIN [facility] SERVICES [address] [place], [place], [address] Phone: [phone]   Fax:  [phone]  Name: Rose Bernard MRN: [id number] Date of Birth: [birth year]

## 2015-09-29 ENCOUNTER — Encounter: Payer: Self-pay | Admitting: Emergency Medicine

## 2015-09-29 ENCOUNTER — Ambulatory Visit
Admit: 2015-09-29 | Discharge: 2015-09-29 | Disposition: A | Discharge status: Home / Self Care | Payer: BLUE CROSS/BLUE SHIELD | Attending: Family Medicine | Admitting: Family Medicine

## 2015-09-29 ENCOUNTER — Other Ambulatory Visit: Payer: Self-pay | Admitting: *Deleted

## 2015-09-29 ENCOUNTER — Encounter: Payer: Self-pay | Admitting: *Deleted

## 2015-09-29 ENCOUNTER — Ambulatory Visit: Admit: 2015-09-29 | Payer: BLUE CROSS/BLUE SHIELD

## 2015-09-29 ENCOUNTER — Ambulatory Visit
Admission: RE | Admit: 2015-09-29 | Discharge: 2015-09-29 | Disposition: A | Discharge status: Home / Self Care | Payer: BLUE CROSS/BLUE SHIELD | Source: Ambulatory Visit | Attending: Family Medicine | Admitting: Family Medicine

## 2015-09-29 ENCOUNTER — Ambulatory Visit
Admission: EM | Admit: 2015-09-29 | Discharge: 2015-09-29 | Disposition: A | Discharge status: Other Acute Inpatient Hospital | Payer: BLUE CROSS/BLUE SHIELD | Attending: Family Medicine | Admitting: Family Medicine

## 2015-09-29 DIAGNOSIS — R222 Localized swelling, mass and lump, trunk: Secondary | ICD-10-CM

## 2015-09-29 DIAGNOSIS — R1900 Intra-abdominal and pelvic swelling, mass and lump, unspecified site: Secondary | ICD-10-CM | POA: Insufficient documentation

## 2015-09-29 MED ORDER — IOHEXOL 350 MG/ML SOLN
80.0000 mL | Freq: Once | INTRAVENOUS | Status: AC | PRN
  Administered 2015-09-29: 80 mL via INTRAVENOUS

## 2015-09-29 NOTE — ED Notes (Signed)
Patient scheduled an appointment with [facility] for tomorrow 09/30/15 at 10:30am.

## 2015-09-29 NOTE — ED Provider Notes (Addendum)
CSN: [id number]     Arrival date & time 09/29/15  0759 History   First MD Initiated Contact with Patient 09/29/15 [phone]     Chief Complaint  Patient presents with  . Groin Pain   (Consider location/radiation/quality/duration/timing/severity/associated sxs/prior Treatment) HPI Comments: 49 yo female with a complaint of a painful lump on her abdomen, near the umbilicus. Patient states she had noticed a very small "pinpoint" bump under the skin at least several weeks back but since yesterday has become larger and more tender. Denies any redness to the skin, fevers, drainage, vomiting, urinary discomfort,  recent trauma or injuries. Has had abdominal surgeries in the past.   The history is provided by the patient.    Past Medical History  Diagnosis Date  . Frequent UTI   . Chronic cystitis   . Incomplete bladder emptying    Past Surgical History  Procedure Laterality Date  . Breast surgery Bilateral 2002    benign mass removed  . Bladder botox    . Foot surgery Bilateral   . Breast biopsy Bilateral     neg  . Reconstruction of bladder   1995     2/2 delivery complications with 3rd child   . Appendectomy      2001 due to scar tissue  . Abdominal hysterectomy  1995    2/2 delivery complications with 3rd child. Hysterectomy and bladder repair with subsequent appendectomy. L Ovary remain in place    Family History  Problem Relation Age of Onset  . Colon cancer Father     With ostomy in place   . Breast cancer Paternal Aunt 36   Social History  Substance Use Topics  . Smoking status: Never Smoker   . Smokeless tobacco: Never Used  . Alcohol Use: No   OB History    No data available     Review of Systems  Allergies  Penicillin v potassium  Home Medications   Prior to Admission medications   Medication Sig Start Date End Date Taking? Authorizing Provider  doxycycline (VIBRA-TABS) 100 MG tablet Take 1 tablet (100 mg total) by mouth 2 (two) times daily. Patient not  taking: Reported on 09/23/2015 04/06/15   Einar Pheasant, MD  metaxalone (SKELAXIN) 400 MG tablet Take 400 mg by mouth. 09/16/15   Historical Provider, MD  nitrofurantoin, macrocrystal-monohydrate, (MACROBID) 100 MG capsule Take 100 mg by mouth 2 (two) times daily.    Historical Provider, MD  sulfamethoxazole-trimethoprim (BACTRIM DS,SEPTRA DS) 800-160 MG per tablet Take 1 tablet by mouth 2 (two) times daily. Patient not taking: Reported on 09/23/2015 12/10/14   Jearld Fenton, NP   Meds Ordered and Administered this Visit  Medications - No data to display  Pulse 55  Temp(Src) 97.9 F (36.6 C) (Oral)  Resp 16  Ht 5\' 5"  (1.651 m)  Wt 112 lb (50.803 kg)  BMI 18.64 kg/m2  SpO2 100%  LMP 02/07/1994 No data found.   Physical Exam  Constitutional: She appears well-developed and well-nourished. No distress.  Abdominal: Soft. Bowel sounds are normal. She exhibits mass (abdominal wall skin with subcutaneous, tender, non-erythematous, solid appearing mass). She exhibits no distension. There is no tenderness. There is no rebound and no guarding.  Skin: She is not diaphoretic.  Nursing note and vitals reviewed.   ED Course  Procedures (including critical care time)  Labs Review Labs Reviewed - No data to display  Imaging Review Ct Abdomen Pelvis W Contrast  09/29/2015  CLINICAL DATA:  Painful palpable mass  anterior abdomen. EXAM: CT ABDOMEN AND PELVIS WITH CONTRAST TECHNIQUE: Multidetector CT imaging of the abdomen and pelvis was performed using the standard protocol following bolus administration of intravenous contrast. CONTRAST:  93mL OMNIPAQUE IOHEXOL 350 MG/ML SOLN COMPARISON:  Ultrasound today.  CT abdomen pelvis 07/14/2006 FINDINGS: Lower chest:  Lung bases clear Hepatobiliary: Small hepatic cysts. No liver mass. Gallbladder and bile ducts normal. Pancreas: Negative Spleen: Negative Adrenals/Urinary Tract: Parapelvic cyst on the left. No renal obstruction or mass. 3 mm nonobstructing  calculus right mid pole. Sub cm left renal cyst. Stomach/Bowel: Negative for bowel obstruction. No bowel edema. Prior appendectomy. Vascular/Lymphatic:  Negative Reproductive: Left adnexal cyst measures 22 mm. This is significantly smaller when than the prior CT when a large complex cyst was present in the left adnexa. Hysterectomy changes. Other: Palpable abnormality corresponds to subcutaneous nodule in the anterior abdominal wall mid pelvis. This measures 12 mm. Based on ultrasound this appears either solid or complex cystic. This could represent infection or neoplasm. If this does not resolve with antibiotics, biopsy is recommended. No intra-abdominal adenopathy is identified. Musculoskeletal: Mild lumbar disc degeneration. No fracture or acute bone lesion. IMPRESSION: Palpable abnormality corresponds to a 12 mm subcutaneous nodule in the anterior abdominal wall mid pelvis. This appears solid based on ultrasound. This could represent infection or metastatic disease. Biopsy recommended if this is not respond to antibiotics. Nonobstructing right renal calculus. 2 cm left adnexal cyst has improved significantly in size since prior CT. Electronically Signed   By: Franchot Gallo M.D.   On: 09/29/2015 13:20   US Abdomen Limited  09/29/2015  CLINICAL DATA:  Tender lump on abdominal wall. EXAM: LIMITED ABDOMINAL ULTRASOUND COMPARISON:  CT scan of July 14, 2006. FINDINGS: Complex but predominantly solid mass is noted in the anterior abdominal wall just inferior to the umbilicus which measures 1.3 x 1.2 x 0.9 cm. Doppler demonstrates internal blood flow. IMPRESSION: 1.3 cm complex but predominantly solid mass is noted in the anterior abdominal wall inferior to the umbilicus in area of palpable concern. It does demonstrate some internal flow on Doppler. This is concerning for possible neoplasm and tissue sampling is recommended. Electronically Signed   By: Marijo Conception, M.D.   On: 09/29/2015 09:46     Visual  Acuity Review  Right Eye Distance:   Left Eye Distance:   Bilateral Distance:    Right Eye Near:   Left Eye Near:    Bilateral Near:         MDM   1. Mass of anterior abdominal wall   (subcutaneous skin; does not appear infectious)  1. x-ray results (ultrasound and CT results) and possible etiologies reviewed with patient 2. Patient on a prophylactic antibiotic for recurrent UTIs; states would like to wait until she sees general surgeon tomorrow to consider adding another antibiotic  3. Follow-up with general surgeon as scheduled tomorrow  4. F/U  prn if symptoms worsen    Norval Gable, MD 09/29/15 [place], MD 09/29/15 [phone]

## 2015-09-29 NOTE — ED Notes (Signed)
Patient states she has been having "groin and kidney pain, but this morning she also noticed a painful lump on her abdomin."

## 2015-09-29 NOTE — ED Notes (Signed)
Patient scheduled for US Abdomen for 9:30am at [facility].

## 2015-09-29 NOTE — ED Notes (Signed)
Patient scheduled for CT scan at 3:00pm today at [facility].

## 2015-09-30 ENCOUNTER — Ambulatory Visit (INDEPENDENT_AMBULATORY_CARE_PROVIDER_SITE_OTHER): Payer: BLUE CROSS/BLUE SHIELD | Admitting: Surgery

## 2015-09-30 ENCOUNTER — Encounter: Payer: Self-pay | Admitting: Surgery

## 2015-09-30 ENCOUNTER — Ambulatory Visit: Payer: BLUE CROSS/BLUE SHIELD | Admitting: Physical Therapy

## 2015-09-30 VITALS — BP 116/63 | HR 60 | Temp 97.7°F | Ht 65.0 in | Wt 116.0 lb

## 2015-09-30 DIAGNOSIS — R1900 Intra-abdominal and pelvic swelling, mass and lump, unspecified site: Secondary | ICD-10-CM

## 2015-09-30 NOTE — Patient Instructions (Addendum)
Your follow up appointment is on 10/13/15 at 1:30 PM in the [place] office. If you have any questions, please contact our office at your earliest convenience. Please contact your primary care physician for further workup of these unrelated symptoms that we spoke of.

## 2015-09-30 NOTE — Progress Notes (Signed)
Surgical Consultation  09/30/2015  Rose Bernard is an 49 y.o. female.   CC: Abdominal wall mass  HPI: This a patient with a midline abdominal wall mass causing her some discomfort. I had been called by the urgent care physician who I spoke to personally yesterday and I have personally reviewed the CT scans that I had suggested be performed yesterday. Patient states that the mass came up yesterday. On further questioning she states that she has had a "ball point pen" sensation which caused her minimal pain for years and over the last 2 weeks that's caused her more pain. She describes having gone to the physical therapist and being told that this was scar tissue and having manipulation of this area resulting in a bruise. When she first noticed the mass itself. But then she says that it only came up yesterday. She has had 20 pound weight loss over 2 years which is unintentional and has had nausea  and diarrhea but no melena or hematochezia and has had a colonoscopy in the last 2 years. He has a family history of colon cancer.  Past Medical History  Diagnosis Date  . Frequent UTI   . Chronic cystitis   . Incomplete bladder emptying   . 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 Breast surgery Bilateral 2002    benign mass removed  . Bladder botox    . Foot surgery Bilateral   . Breast biopsy Bilateral     neg  . Reconstruction of bladder   1995     2/2 delivery complications with 3rd child   . Appendectomy      2001 due to scar tissue  . Abdominal hysterectomy  1995    2/2 delivery complications with 3rd child. Hysterectomy and bladder repair with subsequent appendectomy. L Ovary remain in place     Family History  Problem Relation Age of Onset  . Colon cancer Father     With ostomy in place   . Breast cancer Paternal Aunt 63    Social History:  reports that she has never smoked. She has never used smokeless  tobacco. She reports that she does not drink alcohol or use illicit drugs.  Allergies:  Allergies  Allergen Reactions  . Penicillin V Potassium     Medications reviewed.   Review of Systems:   Review of Systems  Constitutional: Positive for fever, chills, weight loss and malaise/fatigue.       20 pound weight loss in 2 years unintentional  HENT: Negative.   Eyes: Negative.   Respiratory: Negative.   Cardiovascular: Negative.   Gastrointestinal: Positive for nausea, abdominal pain and diarrhea. Negative for vomiting, constipation, blood in stool and melena.       Has had a colonoscopy in the last 2 years  Genitourinary: Positive for dysuria. Negative for urgency.  Musculoskeletal: Negative.   Skin: Negative.   Neurological: Positive for weakness.  Endo/Heme/Allergies: Negative.   Psychiatric/Behavioral: Negative.      Physical Exam:  BP 116/63 mmHg  Pulse 60  Temp(Src) 97.7 F (36.5 C) (Oral)  Ht 5\' 5"  (1.651 m)  Wt 116 lb (52.617 kg)  BMI 19.30 kg/m2  LMP 02/07/1994  Physical Exam  Constitutional: She is oriented to person, place, and time. No distress.  Very thin-appearing female patient in no acute distress  HENT:  Head: Normocephalic and atraumatic.  Eyes: Pupils are equal, round, and reactive to light. No scleral  icterus.  Neck: Normal range of motion. Neck supple.  Cardiovascular: Normal rate, regular rhythm and normal heart sounds.   Pulmonary/Chest: Effort normal and breath sounds normal. No respiratory distress. She has no wheezes. She has no rales.  Abdominal: Soft. She exhibits no distension. There is no tenderness. There is no rebound and no guarding.  2 cm mass on the midline slightly to the right of midline with some overlying resolving ecchymosis in the BiliVerdin phase. This mass is soft non-mobile but with pressure it is nontender but seems to soften considerably without reducing. Not an obvious hernia. He also seems to be a palpable suture beneath  and adjacent to it.  Musculoskeletal: She exhibits no edema.  Lymphadenopathy:    She has no cervical adenopathy.  Neurological: She is alert and oriented to person, place, and time.  Skin: Skin is warm and dry.  Psychiatric: Mood, affect and judgment normal.      No results found for this or any previous visit (from the past 48 hour(s)). Ct Abdomen Pelvis W Contrast  09/29/2015  CLINICAL DATA:  Painful palpable mass anterior abdomen. EXAM: CT ABDOMEN AND PELVIS WITH CONTRAST TECHNIQUE: Multidetector CT imaging of the abdomen and pelvis was performed using the standard protocol following bolus administration of intravenous contrast. CONTRAST:  64mL OMNIPAQUE IOHEXOL 350 MG/ML SOLN COMPARISON:  Ultrasound today.  CT abdomen pelvis 07/14/2006 FINDINGS: Lower chest:  Lung bases clear Hepatobiliary: Small hepatic cysts. No liver mass. Gallbladder and bile ducts normal. Pancreas: Negative Spleen: Negative Adrenals/Urinary Tract: Parapelvic cyst on the left. No renal obstruction or mass. 3 mm nonobstructing calculus right mid pole. Sub cm left renal cyst. Stomach/Bowel: Negative for bowel obstruction. No bowel edema. Prior appendectomy. Vascular/Lymphatic:  Negative Reproductive: Left adnexal cyst measures 22 mm. This is significantly smaller when than the prior CT when a large complex cyst was present in the left adnexa. Hysterectomy changes. Other: Palpable abnormality corresponds to subcutaneous nodule in the anterior abdominal wall mid pelvis. This measures 12 mm. Based on ultrasound this appears either solid or complex cystic. This could represent infection or neoplasm. If this does not resolve with antibiotics, biopsy is recommended. No intra-abdominal adenopathy is identified. Musculoskeletal: Mild lumbar disc degeneration. No fracture or acute bone lesion. IMPRESSION: Palpable abnormality corresponds to a 12 mm subcutaneous nodule in the anterior abdominal wall mid pelvis. This appears solid based on  ultrasound. This could represent infection or metastatic disease. Biopsy recommended if this is not respond to antibiotics. Nonobstructing right renal calculus. 2 cm left adnexal cyst has improved significantly in size since prior CT. Electronically Signed   By: Franchot Gallo M.D.   On: 09/29/2015 13:20   US Abdomen Limited  09/29/2015  CLINICAL DATA:  Tender lump on abdominal wall. EXAM: LIMITED ABDOMINAL ULTRASOUND COMPARISON:  CT scan of July 14, 2006. FINDINGS: Complex but predominantly solid mass is noted in the anterior abdominal wall just inferior to the umbilicus which measures 1.3 x 1.2 x 0.9 cm. Doppler demonstrates internal blood flow. IMPRESSION: 1.3 cm complex but predominantly solid mass is noted in the anterior abdominal wall inferior to the umbilicus in area of palpable concern. It does demonstrate some internal flow on Doppler. This is concerning for possible neoplasm and tissue sampling is recommended. Electronically Signed   By: Marijo Conception, M.D.   On: 09/29/2015 09:46    Assessment/Plan:  CT scan is personally reviewed there appears to be a mass on the midline but on physical exam this appears to  be a hematoma there is overlying BiliVerdin which is resolving and the mass itself is nontender it does not reduce like a hernia but is very soft and tends to spread when pressed. The patient herself however does not appear well she is not in acute distress but appears cachectic, thin, and although she has had a colonoscopy in the past I believe she deserves further workup for her nausea and diarrhea which is unlikely related to this mass. Then that she goes back to a primary care physician for further workup I will order some labs and reexamine this mass in 2 weeks to see that as as completely resolved I would not recommend surgical exploration of this area at this time.   Florene Glen, MD, FACS

## 2015-10-01 ENCOUNTER — Encounter: Payer: Self-pay | Admitting: Internal Medicine

## 2015-10-01 ENCOUNTER — Ambulatory Visit (INDEPENDENT_AMBULATORY_CARE_PROVIDER_SITE_OTHER): Payer: BLUE CROSS/BLUE SHIELD | Admitting: Internal Medicine

## 2015-10-01 VITALS — BP 100/60 | HR 68 | Temp 98.1°F | Resp 18 | Ht 65.0 in | Wt 114.5 lb

## 2015-10-01 DIAGNOSIS — N3020 Other chronic cystitis without hematuria: Secondary | ICD-10-CM

## 2015-10-01 DIAGNOSIS — Z800 Family history of malignant neoplasm of digestive organs: Secondary | ICD-10-CM | POA: Diagnosis not present

## 2015-10-01 DIAGNOSIS — R634 Abnormal weight loss: Secondary | ICD-10-CM | POA: Diagnosis not present

## 2015-10-01 DIAGNOSIS — R1032 Left lower quadrant pain: Secondary | ICD-10-CM | POA: Diagnosis not present

## 2015-10-01 DIAGNOSIS — Z8742 Personal history of other diseases of the female genital tract: Secondary | ICD-10-CM

## 2015-10-01 NOTE — Progress Notes (Signed)
Pre-visit discussion using our clinic review tool. No additional management support is needed unless otherwise documented below in the visit note.  

## 2015-10-01 NOTE — Progress Notes (Signed)
Patient ID: Rose Bernard, female   DOB: 1965-12-22, 49 y.o.   MRN: [id number]   Subjective:    Patient ID: Rose Bernard, female    DOB: September 05, 1966, 49 y.o.   MRN: [id number]  HPI  Patient with past history of chronic cystitis, incomplete bladder emptying, kidney stones and pelvic pain.  She has been followed by Dr Jacqlyn Larsen.  See his notes for details.  Was recently referred to Dr Otho Perl (gyn).  I reviewed her note.  She felt that pelvic floor muscle spasm would be a major factor in her persistent pain.  Referred her for pelvic floor physical therapy.  She had her first session several days ago.  She subsequently noticed a midline abdominal wall mass which caused her some discomfort.  Had abdominal ultrasound which revealed a 1.3cm complex solid mass in the anterior wall - inferior to the umbilicus.  See ultrasound report for details.  Subsequent CT of the abdomen and pelvis revealed 33m subcutaneous nodule in the anterior abdominal wall - mid pelvis. See CT report for details.  Also note of a nonobstructing right renal calculus and a 2cm left adnexal cyst.  This has been present previously.  Most of her pain is localized to the LLQ on exam today.  She had lots of questions about the pelvic exercise.  Is frustrated about the continued pain.  In reviewing her chart, she has low 15-20 pounds over th last two years.  Some occasional nausea.  Bowels vary.  Some intermittent constipation.  She saw Dr CBurt Knackfor the abdominal wall mass.  Recommended observation of the mass.  See his note for details.  No cough or congestion.  Still working.  Some occasional ringing in her ears.     Past Medical History  Diagnosis Date  . Frequent UTI   . Chronic cystitis   . Incomplete bladder emptying   . 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 Breast surgery Bilateral 2002    benign mass removed  . Bladder botox    . Foot surgery Bilateral   .  Breast biopsy Bilateral     neg  . Reconstruction of bladder   1995     2/2 delivery complications with 3rd child   . Appendectomy      2001 due to scar tissue  . Abdominal hysterectomy  1995    2/2 delivery complications with 3rd child. Hysterectomy and bladder repair with subsequent appendectomy. L Ovary remain in place    Family History  Problem Relation Age of Onset  . Colon cancer Father     With ostomy in place   . Breast cancer Paternal Aunt 531  Social History   Social History  . Marital Status: Married    Spouse Name: N/A  . Number of Children: 2  . Years of Education: N/A   Social History Main Topics  . Smoking status: Never Smoker   . Smokeless tobacco: Never Used  . Alcohol Use: No  . Drug Use: No  . Sexual Activity: Yes    Birth Control/ Protection: Surgical   Other Topics Concern  . None   Social History Narrative    Outpatient Encounter Prescriptions as of 10/01/2015  Medication Sig  . nitrofurantoin, macrocrystal-monohydrate, (MACROBID) 100 MG capsule Take 100 mg by mouth 2 (two) times daily.   No facility-administered encounter medications on file as of 10/01/2015.    Review of Systems  Constitutional:  Has had significant weight loss the past two years.  (15-20 pounds).  Is eating.  Nausea on occasion.  Main complaint is that of LLQ pain and pelvic pain.    HENT: Negative for sinus pressure.        Minimal congestion at times.    Eyes: Negative for discharge and visual disturbance.  Respiratory: Negative for cough, chest tightness and shortness of breath.   Cardiovascular: Negative for chest pain, palpitations and leg swelling.  Gastrointestinal: Positive for abdominal pain.       Some nausea at times.  Bowels vary.  LLQ pain and pelvic pain as outlined.   Genitourinary: Negative for hematuria and vaginal discharge.  Musculoskeletal: Negative for joint swelling.       Some leg pain intermittently.   Skin: Negative for color change and  rash.  Neurological: Negative for headaches.       No significant dizziness.    Psychiatric/Behavioral: Negative for dysphoric mood.       Increased frustration.         Objective:    Physical Exam  Constitutional: No distress.  HENT:  Nose: Nose normal.  Mouth/Throat: Oropharynx is clear and moist.  Eyes: Conjunctivae are normal. Right eye exhibits no discharge. Left eye exhibits no discharge.  Neck: Neck supple.  Cardiovascular: Normal rate and regular rhythm.   Pulmonary/Chest: Breath sounds normal. No respiratory distress. She has no wheezes.  Abdominal: Soft. Bowel sounds are normal.  Increased pain over the pelvic region -  More localized to the LLQ.    Musculoskeletal: She exhibits no edema or tenderness.  Lymphadenopathy:    She has no cervical adenopathy.  Skin: No rash noted. No erythema.  Psychiatric: She has a normal mood and affect. Her behavior is normal.    BP 100/60 mmHg  Pulse 68  Temp(Src) 98.1 F (36.7 C) (Oral)  Resp 18  Ht 5' 5" (1.651 m)  Wt 114 lb 8 oz (51.937 kg)  BMI 19.05 kg/m2  SpO2 97%  LMP 02/07/1994 Wt Readings from Last 3 Encounters:  10/01/15 114 lb 8 oz (51.937 kg)  09/30/15 116 lb (52.617 kg)  09/29/15 112 lb (50.803 kg)     Lab Results  Component Value Date   WBC 5.0 11/04/2014   HGB 12.4 11/04/2014   HCT 37.4 11/04/2014   PLT 216.0 11/04/2014   GLUCOSE 78 10/16/2014   CHOL 180 10/16/2014   TRIG 46.0 10/16/2014   HDL 55.80 10/16/2014   LDLCALC 115* 10/16/2014   ALT 13 10/16/2014   AST 13 10/16/2014   NA 140 10/16/2014   K 3.8 10/16/2014   CL 105 10/16/2014   CREATININE 0.7 09/02/2015   BUN 15 09/02/2015   CO2 24 10/16/2014   TSH 1.42 10/16/2014    Ct Abdomen Pelvis W Contrast  09/29/2015  CLINICAL DATA:  Painful palpable mass anterior abdomen. EXAM: CT ABDOMEN AND PELVIS WITH CONTRAST TECHNIQUE: Multidetector CT imaging of the abdomen and pelvis was performed using the standard protocol following bolus  administration of intravenous contrast. CONTRAST:  25m OMNIPAQUE IOHEXOL 350 MG/ML SOLN COMPARISON:  Ultrasound today.  CT abdomen pelvis 07/14/2006 FINDINGS: Lower chest:  Lung bases clear Hepatobiliary: Small hepatic cysts. No liver mass. Gallbladder and bile ducts normal. Pancreas: Negative Spleen: Negative Adrenals/Urinary Tract: Parapelvic cyst on the left. No renal obstruction or mass. 3 mm nonobstructing calculus right mid pole. Sub cm left renal cyst. Stomach/Bowel: Negative for bowel obstruction. No bowel edema. Prior appendectomy. Vascular/Lymphatic:  Negative Reproductive: Left  adnexal cyst measures 22 mm. This is significantly smaller when than the prior CT when a large complex cyst was present in the left adnexa. Hysterectomy changes. Other: Palpable abnormality corresponds to subcutaneous nodule in the anterior abdominal wall mid pelvis. This measures 12 mm. Based on ultrasound this appears either solid or complex cystic. This could represent infection or neoplasm. If this does not resolve with antibiotics, biopsy is recommended. No intra-abdominal adenopathy is identified. Musculoskeletal: Mild lumbar disc degeneration. No fracture or acute bone lesion. IMPRESSION: Palpable abnormality corresponds to a 12 mm subcutaneous nodule in the anterior abdominal wall mid pelvis. This appears solid based on ultrasound. This could represent infection or metastatic disease. Biopsy recommended if this is not respond to antibiotics. Nonobstructing right renal calculus. 2 cm left adnexal cyst has improved significantly in size since prior CT. Electronically Signed   By: Franchot Gallo M.D.   On: 09/29/2015 13:20   US Abdomen Limited  09/29/2015  CLINICAL DATA:  Tender lump on abdominal wall. EXAM: LIMITED ABDOMINAL ULTRASOUND COMPARISON:  CT scan of July 14, 2006. FINDINGS: Complex but predominantly solid mass is noted in the anterior abdominal wall just inferior to the umbilicus which measures 1.3 x 1.2 x 0.9  cm. Doppler demonstrates internal blood flow. IMPRESSION: 1.3 cm complex but predominantly solid mass is noted in the anterior abdominal wall inferior to the umbilicus in area of palpable concern. It does demonstrate some internal flow on Doppler. This is concerning for possible neoplasm and tissue sampling is recommended. Electronically Signed   By: Marijo Conception, M.D.   On: 09/29/2015 09:46       Assessment & Plan:   Problem List Items Addressed This Visit    Abdominal pain, left lower quadrant    Persistent pain.  Worse over the LLQ.  Reviewed CT scan and ultrasound. Has the left adnexal cyst.  She just saw gyn.  Referred for pelvic floor exercises.  Discussed further w/up.  Has had significant weight loss (15-20 pounds) over the last two years.  She had multiple questions about the therapy.  Given the persistent adnexal cyst with more localized pain in this area, combined with the increased weight loss and her questions - I do feel a follow up appt with gyn is warranted.  Would like to know that nothing more needs to be done regarding the left adnexal cyst.  I discussed with her also regarding further GI w/up if gyn evaluation unrevealing.  She had colonoscopy 3/5/`4 - internal hemorrhoids otherwise normal.         Chronic cystitis    Is s/p bladder surgery.  Followed by Dr Jacqlyn Larsen.  S/p botox.        Family history of colon cancer    Colonoscopy 02/13/14 as outlined.  Recommended f/u colonoscopy in five years.       History of ovarian cyst    Recent CT as outlined.  Schedule f/u with gyn as outlined.        Loss of weight - Primary    Has lost 15-20 pounds over the last two years.  Unintentional weight loss.  Check cbc, met c and tsh.  CT as outlined.  Schedule f/u with gyn regarding the left adnexal cyst.  Discussed GI referral.  Had colonoscopy 02/13/13 - internal hemorrhoids otherwise normal.  May need further GI w/up pending gyn review.        Relevant Orders   CBC with  Differential/Platelet   TSH   Hepatic function  panel   Basic metabolic panel   Hemoglobin A1c       Einar Pheasant, MD

## 2015-10-02 ENCOUNTER — Encounter: Payer: Self-pay | Admitting: Internal Medicine

## 2015-10-02 ENCOUNTER — Encounter: Payer: Self-pay | Admitting: *Deleted

## 2015-10-02 ENCOUNTER — Encounter: Payer: BLUE CROSS/BLUE SHIELD | Admitting: Physical Therapy

## 2015-10-02 DIAGNOSIS — R634 Abnormal weight loss: Secondary | ICD-10-CM | POA: Insufficient documentation

## 2015-10-02 LAB — CBC WITH DIFFERENTIAL/PLATELET
BASOS PCT: 0.5 % (ref 0.0–3.0)
Basophils Absolute: 0 10*3/uL (ref 0.0–0.1)
EOS PCT: 2.8 % (ref 0.0–5.0)
Eosinophils Absolute: 0.1 10*3/uL (ref 0.0–0.7)
HEMATOCRIT: 42.1 % (ref 36.0–46.0)
HEMOGLOBIN: 14 g/dL (ref 12.0–15.0)
LYMPHS PCT: 40.9 % (ref 12.0–46.0)
Lymphs Abs: 2.2 10*3/uL (ref 0.7–4.0)
MCHC: 33.3 g/dL (ref 30.0–36.0)
MCV: 93.3 fl (ref 78.0–100.0)
Monocytes Absolute: 0.3 10*3/uL (ref 0.1–1.0)
Monocytes Relative: 6.5 % (ref 3.0–12.0)
Neutro Abs: 2.6 10*3/uL (ref 1.4–7.7)
Neutrophils Relative %: 49.3 % (ref 43.0–77.0)
Platelets: 238 10*3/uL (ref 150.0–400.0)
RBC: 4.52 Mil/uL (ref 3.87–5.11)
RDW: 14.4 % (ref 11.5–15.5)
WBC: 5.3 10*3/uL (ref 4.0–10.5)

## 2015-10-02 LAB — HEPATIC FUNCTION PANEL
ALK PHOS: 64 U/L (ref 39–117)
ALT: 14 U/L (ref 0–35)
AST: 14 U/L (ref 0–37)
Albumin: 4.4 g/dL (ref 3.5–5.2)
BILIRUBIN DIRECT: 0.1 mg/dL (ref 0.0–0.3)
BILIRUBIN TOTAL: 0.4 mg/dL (ref 0.2–1.2)
Total Protein: 7.5 g/dL (ref 6.0–8.3)

## 2015-10-02 LAB — BASIC METABOLIC PANEL
BUN: 15 mg/dL (ref 6–23)
CHLORIDE: 102 meq/L (ref 96–112)
CO2: 28 meq/L (ref 19–32)
CREATININE: 0.79 mg/dL (ref 0.40–1.20)
Calcium: 9.4 mg/dL (ref 8.4–10.5)
GFR: 81.97 mL/min (ref >60.00)
GLUCOSE: 82 mg/dL (ref 70–99)
Potassium: 4.4 mEq/L (ref 3.5–5.1)
Sodium: 138 mEq/L (ref 135–145)

## 2015-10-02 LAB — HEMOGLOBIN A1C: Hgb A1c MFr Bld: 5.6 % (ref 4.6–6.5)

## 2015-10-02 LAB — TSH: TSH: 2.03 u[IU]/mL (ref 0.35–4.50)

## 2015-10-02 NOTE — Assessment & Plan Note (Signed)
Has lost 15-20 pounds over the last two years.  Unintentional weight loss.  Check cbc, met c and tsh.  CT as outlined.  Schedule f/u with gyn regarding the left adnexal cyst.  Discussed GI referral.  Had colonoscopy 02/13/13 - internal hemorrhoids otherwise normal.  May need further GI w/up pending gyn review.

## 2015-10-02 NOTE — Assessment & Plan Note (Addendum)
Persistent pain.  Worse over the LLQ.  Reviewed CT scan and ultrasound. Has the left adnexal cyst.  She just saw gyn.  Referred for pelvic floor exercises.  Discussed further w/up.  Has had significant weight loss (15-20 pounds) over the last two years.  She had multiple questions about the therapy.  Given the persistent adnexal cyst with more localized pain in this area, combined with the increased weight loss and her questions - I do feel a follow up appt with gyn is warranted.  Would like to know that nothing more needs to be done regarding the left adnexal cyst.  I discussed with her also regarding further GI w/up if gyn evaluation unrevealing.  She had colonoscopy 3/5/`4 - internal hemorrhoids otherwise normal.

## 2015-10-02 NOTE — Assessment & Plan Note (Signed)
Recent CT as outlined.  Schedule f/u with gyn as outlined.

## 2015-10-02 NOTE — Assessment & Plan Note (Signed)
Is s/p bladder surgery.  Followed by Dr Jacqlyn Larsen.  S/p botox.

## 2015-10-02 NOTE — Assessment & Plan Note (Signed)
Colonoscopy 02/13/14 as outlined.  Recommended f/u colonoscopy in five years.

## 2015-10-05 ENCOUNTER — Telehealth: Payer: Self-pay | Admitting: *Deleted

## 2015-10-05 NOTE — Telephone Encounter (Signed)
Reviewed labs with patient.  She verbalized understanding.

## 2015-10-05 NOTE — Telephone Encounter (Signed)
Patient requested a update for her labs that she had drawn in the office last Thursday-thanks

## 2015-10-06 ENCOUNTER — Telehealth: Payer: Self-pay | Admitting: *Deleted

## 2015-10-06 ENCOUNTER — Telehealth: Payer: Self-pay | Admitting: Physical Therapy

## 2015-10-06 NOTE — Telephone Encounter (Signed)
PT spoke with pt, informing her that Dr. Riki Sheer RN has been made aware re: her medical work-up and Dr.Scott and Cooper's offices have been contacted to fax over her recent reports to Dr. Riki Sheer office.

## 2015-10-06 NOTE — Telephone Encounter (Signed)
PT called the office of Dr. Nicki Reaper [phone] and spoke with Janett Billow. PT informed her that the nurse of Dr.Moulder (pt's gynecologist at [facility] who referred her to PT) has been notified about pt's medical work-up but the recent notes of Dr. Nicki Reaper could be visualized currently through the [facility] system. PT requests for recent reports to be faxed over to Dr. Riki Sheer office at [phone]. Janett Billow voiced understanding.

## 2015-10-06 NOTE — Telephone Encounter (Signed)
PT called Jacquenette Shone, RN of Dr. Otho Perl at [facility], informing her of pt's medical work up. RN stated she was not able to pull-up her more updated records on [facility] and has not received any reports from her MDs. PT reported she will call the offices of Dr.Cooper and Nicki Reaper to inform them to fax over her updated reports.

## 2015-10-06 NOTE — Telephone Encounter (Signed)
FYI: Physical Therapy has requested that Dr. Nicki Reaper fax over her Notes for patient over to Dr. Otho Perl. Physical therapy will be on hold until Notes are received. Dr. Verdie Shire could not read notes. Fax  Number is  [phone]

## 2015-10-06 NOTE — Telephone Encounter (Signed)
PT f/u with a message relayed from front desk staff re: pt cancelling appt 2/2 abdominal pain and MD advising to withhold PT. PT spoke with pt over the phone, communicating she has read pt's medical records with an understanding that she is under medical follow -up re: abdominal mass.  PT told pt she will informing Dr. Adriana Mccallum re: her status and the hold on PT.

## 2015-10-06 NOTE — Telephone Encounter (Signed)
PT spoke with Sharyn Lull at [facility] [phone] with a request for pt's recent report to faxed to Dr. Riki Sheer (pt's MD at [facility]) office at [phone]. Sharyn Lull voiced understanding.

## 2015-10-07 NOTE — Telephone Encounter (Signed)
Please advise 

## 2015-10-07 NOTE — Telephone Encounter (Signed)
Ok to fax notes.  Note is typed in the system.  Maybe they did not have access to our system.  I am not sure.  I can access their notes.  Please fax.

## 2015-10-08 NOTE — Telephone Encounter (Signed)
Faxed recent note.

## 2015-10-09 ENCOUNTER — Telehealth: Payer: Self-pay | Admitting: Physical Therapy

## 2015-10-09 ENCOUNTER — Ambulatory Visit: Payer: BLUE CROSS/BLUE SHIELD | Admitting: Physical Therapy

## 2015-10-12 NOTE — Telephone Encounter (Signed)
Pt called to PT explaining that she was informed that Dr. Otho Perl has ordered an MRI because the CT that was ordered did not successfully visualize her R ovary. Pt expressed concern about costs for tests and that she works 7 days a week trying to pay for her medical costs which has accumulated ever since she started having this pain 20 years ago after giving birth to her daughter.  Pt wanted to make sure that she has not had the same test done again, asking PT if she was able to verify whether she has had the test done before. PT acknowledged pt's frustration, explaining she was not able to answer her question and encouraged pt to follow her MD's orders. PT explained she can call her insurance for inquiries about coverage for her tests. Pt was also provided the phone number to [facility] patient billing services. Pt voiced understanding. Pt also stated she will be seeing Dr. Burt Knack tomorrow.

## 2015-10-13 ENCOUNTER — Encounter: Payer: Self-pay | Admitting: Surgery

## 2015-10-13 ENCOUNTER — Ambulatory Visit (INDEPENDENT_AMBULATORY_CARE_PROVIDER_SITE_OTHER): Payer: BLUE CROSS/BLUE SHIELD | Admitting: Surgery

## 2015-10-13 VITALS — BP 98/60 | HR 61 | Temp 98.1°F | Ht 65.0 in | Wt 115.2 lb

## 2015-10-13 DIAGNOSIS — R1900 Intra-abdominal and pelvic swelling, mass and lump, unspecified site: Secondary | ICD-10-CM

## 2015-10-13 NOTE — Progress Notes (Signed)
Outpatient Surgical Follow Up  10/13/2015  Rose Bernard is an 49 y.o. female.   CC: Abdominal wall mass  HPI: This patient with abdominal wall mass probably related to a prior suture of a permanent nature on the midline. It was believed to be an hematoma in the past. Patient states it is causing her last pain is much improved and much decreased in size.  Also of note the patient has had considerable weight loss and states that she has seen her primary care physician for blood work and further workup.  Past Medical History  Diagnosis Date  . Frequent UTI   . Chronic cystitis   . Incomplete bladder emptying   . 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 Breast surgery Bilateral 2002    benign mass removed  . Bladder botox    . Foot surgery Bilateral   . Breast biopsy Bilateral     neg  . Reconstruction of bladder   1995     2/2 delivery complications with 3rd child   . Appendectomy      2001 due to scar tissue  . Abdominal hysterectomy  1995    2/2 delivery complications with 3rd child. Hysterectomy and bladder repair with subsequent appendectomy. L Ovary remain in place     Family History  Problem Relation Age of Onset  . Colon cancer Father     With ostomy in place   . Breast cancer Paternal Aunt 36    Social History:  reports that she has never smoked. She has never used smokeless tobacco. She reports that she does not drink alcohol or use illicit drugs.  Allergies:  Allergies  Allergen Reactions  . Penicillin V Potassium     Medications reviewed.   Review of Systems:   Review of Systems  Constitutional: Negative.   Cardiovascular: Negative.   Gastrointestinal: Negative for heartburn, nausea, vomiting, abdominal pain and diarrhea.  Skin: Negative.      Physical Exam:  BP 98/60 mmHg  Pulse 61  Temp(Src) 98.1 F (36.7 C) (Oral)  Ht 5\' 5"  (1.651 m)  Wt 115 lb 3.2 oz (52.254 kg)  BMI  19.17 kg/m2  LMP 02/07/1994  Physical Exam  Constitutional: No distress.  Very thin-appearing female patient  HENT:  Head: Normocephalic and atraumatic.  Pulmonary/Chest: Effort normal.  Abdominal: Soft. She exhibits mass. She exhibits no distension. There is no tenderness. There is no rebound and no guarding.  Masses present on midline and measured approximately 3 mm which is decreased in size ecchymosis has completely resolved this appears to be a subcutaneous suture which is palpable.  Skin: Skin is warm and dry.      No results found for this or any previous visit (from the past 48 hour(s)). No results found.  Assessment/Plan:   Ecchymosis and mass effect of hematoma resolved what this likely represents a subcutaneous permanent suture from prior laparotomy. No surgical intervention needed will follow-up as on an as-needed basis if this enlarges.  Florene Glen, MD, FACS

## 2015-10-16 ENCOUNTER — Ambulatory Visit: Payer: BLUE CROSS/BLUE SHIELD | Admitting: Physical Therapy

## 2015-10-20 ENCOUNTER — Telehealth: Payer: Self-pay | Admitting: *Deleted

## 2015-10-20 NOTE — Telephone Encounter (Signed)
Patient has requested another appt for the month of dec, she stated that she could not take 12/27 off from work. Please advise a place on the schedule at 4:30 the week of ,or before 12/27.

## 2015-10-20 NOTE — Telephone Encounter (Signed)
You can schedule her on 11/30/15 at 4:30.   Thanks

## 2015-10-23 ENCOUNTER — Ambulatory Visit: Payer: BLUE CROSS/BLUE SHIELD | Admitting: Physical Therapy

## 2015-10-29 ENCOUNTER — Telehealth: Payer: Self-pay | Admitting: *Deleted

## 2015-10-29 NOTE — Telephone Encounter (Signed)
Rose Bernard from Physical Therapy requested that, physical therapy be resumed. All other test results have come back with negative. Please advise

## 2015-10-29 NOTE — Telephone Encounter (Signed)
I am ok if pt and gyn agrees.  Gyn had referred her for therapy.  I sent her for f/u to gyn to reevaluate her pain.

## 2015-10-29 NOTE — Telephone Encounter (Signed)
See below please. 

## 2015-10-30 ENCOUNTER — Ambulatory Visit: Payer: BLUE CROSS/BLUE SHIELD | Admitting: Physical Therapy

## 2015-10-30 NOTE — Telephone Encounter (Signed)
Left patient a message.

## 2015-11-13 ENCOUNTER — Ambulatory Visit: Payer: BLUE CROSS/BLUE SHIELD | Attending: Student | Admitting: Physical Therapy

## 2015-11-13 DIAGNOSIS — Z7409 Other reduced mobility: Secondary | ICD-10-CM | POA: Diagnosis present

## 2015-11-13 DIAGNOSIS — R531 Weakness: Secondary | ICD-10-CM | POA: Diagnosis present

## 2015-11-13 DIAGNOSIS — R278 Other lack of coordination: Secondary | ICD-10-CM | POA: Insufficient documentation

## 2015-11-13 NOTE — Patient Instructions (Signed)
1. Stretch:  "Open book" (see handout)      2. Breathing exercise on your back  Inhale - expand ribs and feel pelvic floor lower  Exhale -feel ribcase relax and pelvic floor lift   10 reps x 3       3. Stretch: towel under thigh, inhale do nothing, exhale pull towel to bring thigh to chest  "Knee to chest"  5 x each leg       4.  Stretch: rocking knees side to side together 20-30 deg "[place] wipers"   10 reps each side

## 2015-11-13 NOTE — Therapy (Addendum)
[place] MAIN [facility] SERVICES [address] [place], [place], [address] Phone: [phone]   Fax:  [phone]  Physical Therapy Treatment  Patient Details  Name: Rose Bernard MRN: [id number] Date of Birth: June 05, 1966 No Data Recorded  Encounter Date: 11/13/2015    Past Medical History  Diagnosis Date  . Frequent UTI   . Chronic cystitis   . Incomplete bladder emptying   . 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 Breast surgery Bilateral 2002    benign mass removed  . Bladder botox    . Foot surgery Bilateral   . Breast biopsy Bilateral     neg  . Reconstruction of bladder   1995     2/2 delivery complications with 3rd child   . Appendectomy      2001 due to scar tissue  . Abdominal hysterectomy  1995    2/2 delivery complications with 3rd child. Hysterectomy and bladder repair with subsequent appendectomy. L Ovary remain in place     There were no vitals filed for this visit.  Visit Diagnosis:  Weakness generalized  Coordination impairment  Limited mobility      Subjective Assessment - 11/18/15 1615    Subjective Since last session, pt had only one episode of radiating pain down her leg the day after the session which dissipated the day after. Pt has not experienced radiating pain since that time. Pt reported leakage over the weekend that was not position dependent.     Pertinent History Every 9 months, pt received botox over bladder area in order to prevent closure of ureter and to decrease UTI. Prior to this treatment, pt was getting UTI  6-7x /year. Currently, pt does not have UTI infection according to her visit to see Dr. Jacqlyn Larsen , her urologist  2 weeks ago.     Limitations Sitting;Standing;Walking;House hold activities;Other (comment);Lifting   lifting grandson (30#)    How long can you sit comfortably? 20-30 min   How long can you stand comfortably?  5-10 mins    Patient Stated Goals get better    Pain Onset More than a month ago            [facility] PT Assessment - 11/19/15 2248    Palpation   Spinal mobility increased hypomobility along T segments, increased midback mm tensions  post-Tx: increased mobility    Palpation comment L ribcage limited expansion pre-Tx, post-Tx bilateral expansion                     [facility] Adult PT Treatment/Exercise - 11/19/15 2243    Self-Care   Other Self-Care Comments  guided relaxation   in supine    Neuro Re-ed    Neuro Re-ed Details  --   Exercises   Exercises --  see pt instructions   Manual Therapy   Manual therapy comments thoracic mm release (soft tissue mobilization)                      PT Long Term Goals - 09/24/15 2125    PT LONG TERM GOAL #1   Title Pt will report a decrease on PDU from 60% to < 50% in order to return to ADLs.   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   Time 12   Period Weeks   Status New   PT LONG TERM GOAL #3   Title Pt will demo proper lifting mechanics with 50# from floor to counter 3 reps and activation of deep core without pain in order to lift grandchildren.   Time 12   Period Weeks   Status New   PT LONG TERM GOAL #4   Title Pt will demo decreased scar immobility along horizontal. longitudinal abdominal scars in order to progress to deep core strengthening.    Time 12   Period Weeks   Status New               Plan - 11/19/15 2250    Clinical Impression Statement Pt reported no pain in low back and no radiating pain post_Tx. Pt tolerated manual Tx without complaints. Initiated HEP with mid/low back stretches with relaxation technique. Pt demo'd correctly. Pt will continue to benefit from skilled  PT.    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 Loss of weight 10/02/2015  . Myofascial pain 09/16/2015  . Spasm 09/16/2015  . Chronic female pelvic pain 09/16/2015  . Abdominal pain, left lower quadrant 09/07/2015  . Abnormal mammogram 04/12/2015  . Health care maintenance 04/12/2015  . Sore throat 10/05/2014  . Flank pain 08/21/2014  . Sacral pain 12/29/2013  . Sinus congestion 12/29/2013  . Rib pain 09/29/2013  . FOM (frequency of micturition) 05/15/2013  . Urge incontinence of urine 05/15/2013  . UTI (urinary tract infection) 02/27/2013  . Family history of colon cancer 02/20/2013  . Headache(784.0) 02/11/2013  . Chronic cystitis 02/11/2013  . History of ovarian cyst 02/11/2013  . Reflux, vesicoureteral 10/15/2012  . Symptoms involving  urinary system 10/15/2012  . Mixed incontinence 10/15/2012  . Incomplete bladder emptying 10/15/2012  . Disorder of bladder function 10/15/2012    Jerl Mina ,PT, DPT, E-RYT  11/19/2015, 10:51 PM  [place] MAIN [facility] SERVICES [address] [place], [place], [address] Phone: [phone]   Fax:  [phone]  Name: Rose Bernard MRN: [id number] Date of Birth: 1966-04-12

## 2015-11-18 ENCOUNTER — Ambulatory Visit: Payer: BLUE CROSS/BLUE SHIELD | Admitting: Physical Therapy

## 2015-11-18 DIAGNOSIS — R531 Weakness: Secondary | ICD-10-CM | POA: Diagnosis not present

## 2015-11-18 DIAGNOSIS — Z7409 Other reduced mobility: Secondary | ICD-10-CM

## 2015-11-18 DIAGNOSIS — R278 Other lack of coordination: Secondary | ICD-10-CM

## 2015-11-18 NOTE — Patient Instructions (Signed)
PELVIC FLOOR / KEGEL EXERCISES   Pelvic floor/ Kegel exercises are used to strengthen the muscles in the base of your pelvis that are responsible for supporting your pelvic organs and preventing urine/feces leakage. Based on your therapist's recommendations, they can be performed while standing, sitting, or lying down. Imagine pelvic floor area as a diamond with pelvic landmarks: top =pubic bone, bottom tip=tailbone, sides=sitting bones (ischial tuberosities).    Make yourself aware of this muscle group by using these cues while coordinating your breath:  Inhale, feel pelvic floor diamond area lower like hammock towards your feet and ribcage/belly expanding. Pause. Let the exhale naturally and feel your belly sink, abdominal muscles hugging in around you and you may notice the pelvic diamond draws upward towards your head forming a umbrella shape. Give a squeeze during the exhalation like you are stopping the flow of urine. If you are squeezing the buttock muscles, try to give 50% less effort.   Common Errors:  Breath holding: If you are holding your breath, you may be bearing down against your bladder instead of pulling it up. If you belly bulges up while you are squeezing, you are holding your breath. Be sure to breathe gently in and out while exercising. Counting out loud may help you avoid holding your breath.  Accessory muscle use: You should not see or feel other muscle movement when performing pelvic floor exercises. When done properly, no one can tell that you are performing the exercises. Keep the buttocks, belly and inner thighs relaxed.  Overdoing it: Your muscles can fatigue and stop working for you if you over-exercise. You may actually leak more or feel soreness at the lower abdomen or rectum.  YOUR HOME EXERCISE PROGRAM   SHORT HOLDS: Position: on back   Inhale and then exhale. Then squeeze the muscle.  (Be sure to let belly sink in with exhales and not push outward)  Perform  10 repetitions, morning and bedtime                       DECREASE DOWNWARD PRESSURE ON  YOUR PELVIC FLOOR, ABDOMINAL, LOW BACK MUSCLES       PRESERVE YOUR PELVIC HEALTH LONG-TERM   ** SQUEEZE pelvic floor BEFORE YOUR SNEEZE, COUGH, LAUGH   ** EXHALE BEFORE YOU RISE AGAINST GRAVITY (lifting, sit to stand, from squat to stand)   ** LOG ROLL OUT OF BED INSTEAD OF CRUNCH/SIT-UP    _____________________  Dynamic Stabilization Level 2: 10reps x 3 = in morning and night    ______________________  Open book morning and night      _______________________  Picking up grandson with exhale

## 2015-11-19 NOTE — Therapy (Signed)
[place] MAIN [facility] SERVICES [address] [place], [place], [address] Phone: [phone]   Fax:  [phone]  Physical Therapy Treatment  Patient Details  Name: Rose Bernard MRN: [id number] Date of Birth: 1966/07/27 No Data Recorded  Encounter Date: 11/18/2015      PT End of Session - 11/19/15 2303    Visit Number 2   Number of Visits 12   Date for PT Re-Evaluation 12/09/15   PT Start Time 1600   PT Stop Time 1700   PT Time Calculation (min) 60 min   Activity Tolerance Patient tolerated treatment well;No increased pain   Behavior During Therapy [facility] for tasks assessed/performed      Past Medical History  Diagnosis Date  . Frequent UTI   . Chronic cystitis   . Incomplete bladder emptying   . 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 Breast surgery Bilateral 2002    benign mass removed  . Bladder botox    . Foot surgery Bilateral   . Breast biopsy Bilateral     neg  . Reconstruction of bladder   1995     2/2 delivery complications with 3rd child   . Appendectomy      2001 due to scar tissue  . Abdominal hysterectomy  1995    2/2 delivery complications with 3rd child. Hysterectomy and bladder repair with subsequent appendectomy. L Ovary remain in place     There were no vitals filed for this visit.  Visit Diagnosis:  Weakness generalized  Coordination impairment  Limited mobility      Subjective Assessment - 11/18/15 1615    Subjective Since last session, pt had only one episode of radiating pain down her leg the day after the session which dissipated the day after. Pt has not experienced radiating pain since that time. Pt reported leakage over the weekend that was not position dependent.     Pertinent History Every 9 months, pt received botox over bladder area in order to prevent closure of ureter and to decrease UTI. Prior to this treatment, pt was  getting UTI  6-7x /year. Currently, pt does not have UTI infection according to her visit to see Dr. Jacqlyn Larsen , her urologist  2 weeks ago.     Limitations Sitting;Standing;Walking;House hold activities;Other (comment);Lifting   lifting grandson (30#)    How long can you sit comfortably? 20-30 min   How long can you stand comfortably? 5-10 mins    Patient Stated Goals get better    Pain Onset More than a month ago            [facility] PT Assessment - 11/19/15 2255    Observation/Other Assessments   Observations able to self-correct with upright sitting    Other:   Other/ Comments simulated lifting grandson with narrow BOS and breathholding, able to demo correctly with training 3 reps.   used light stool    Palpation   Spinal mobility decreased spinal mm tensions noted.                  Pelvic Floor Special Questions - 11/19/15 2257    Pelvic Floor Internal Exam pt verbally consented without contraindications    Exam Type Vaginal   Strength fair squeeze, definite lift   Strength # of reps 1   Biofeedback guided imagery, elevator technique, pelvic floor coordination with breathing , pt had no difficulty  [facility] Adult PT Treatment/Exercise - 11/19/15 2258    Therapeutic Activites    Work Simulation pushing Tx table without moving it on exertion and proper leg placement with PT, caused mm spasms. Pt reported no mm spasms after manual Tx.    Neuro Re-ed    Neuro Re-ed Details  progressed to dynamic stabilization 2, lifting with exhalation,    Exercises   Other Exercises  see patient instructions   Moist Heat Therapy   Number Minutes Moist Heat 5 Minutes  skin intact post Tx   Moist Heat Location Lumbar Spine                     PT Long Term Goals - 09/24/15 2125    PT LONG TERM GOAL #1   Title Pt will report a decrease on PDU from 60% to < 50% in order to return to ADLs.   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   Time 12   Period Weeks   Status New   PT LONG TERM GOAL #3   Title Pt will demo proper lifting mechanics with 50# from floor to counter 3 reps and activation of deep core without pain in order to lift grandchildren.   Time 12   Period Weeks   Status New   PT LONG TERM GOAL #4   Title Pt will demo decreased scar immobility along horizontal. longitudinal abdominal scars in order to progress to deep core strengthening.    Time 12   Period Weeks   Status New               Plan - 11/19/15 2303    Clinical Impression Statement Pt progressed to pelvic floor training with Grade 3 and performed 10 reps of quick contractions. She also progressed to next level of deep core strengtheningwith demonstration of proper coordination. She was educated on body mechanics with lifting but simulated pushing caused mm spasm which subsided with manual Tx. Plan to address DRA at next session. Pt will continue to benefit from skilled PT.    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 Loss of weight 10/02/2015  . Myofascial pain 09/16/2015  . Spasm 09/16/2015  . Chronic female pelvic pain 09/16/2015  . Abdominal pain, left lower quadrant 09/07/2015  . Abnormal mammogram 04/12/2015  . Health care maintenance 04/12/2015  . Sore throat 10/05/2014  . Flank pain 08/21/2014  . Sacral pain 12/29/2013  . Sinus congestion 12/29/2013  . Rib pain 09/29/2013  .  FOM (frequency of micturition) 05/15/2013  . Urge incontinence of urine 05/15/2013  . UTI (urinary tract infection) 02/27/2013  . Family history of colon cancer 02/20/2013  . Headache(784.0) 02/11/2013  . Chronic cystitis 02/11/2013  . History of ovarian cyst 02/11/2013  . Reflux, vesicoureteral 10/15/2012  . Symptoms involving urinary system 10/15/2012  . Mixed incontinence 10/15/2012  . Incomplete bladder emptying 10/15/2012  . Disorder of bladder function 10/15/2012    Jerl Mina ,PT, DPT, E-RYT  11/19/2015, 11:06 PM  [place] MAIN [facility] SERVICES [address] [place], [place], [address] Phone: [phone]   Fax:  [phone]  Name: Rose Bernard MRN: [id number] Date of Birth: 1965/12/18

## 2015-11-27 ENCOUNTER — Ambulatory Visit: Payer: BLUE CROSS/BLUE SHIELD | Admitting: Physical Therapy

## 2015-11-27 DIAGNOSIS — R278 Other lack of coordination: Secondary | ICD-10-CM

## 2015-11-27 DIAGNOSIS — Z7409 Other reduced mobility: Secondary | ICD-10-CM

## 2015-11-27 DIAGNOSIS — R531 Weakness: Secondary | ICD-10-CM | POA: Diagnosis not present

## 2015-11-27 NOTE — Patient Instructions (Addendum)
3 way child's pose 5 breath each direction     AT WORK to relieve mid back:  Eagle pose with the arms   -->  do a mini squat (rise up unlock knees) --> stand , arms overhead and then lower them by side and behind back then interlace arms and pull down behind back , chest lifts and breathe, look up  (keep knees unlocked)     Continue with open book  15 x reps   Continue with core exercise with knee out without rocking other side of hip   30 x reps

## 2015-11-27 NOTE — Therapy (Addendum)
[place] MAIN [facility] SERVICES [address] [place], [place], [address] Phone: [phone]   Fax:  [phone]  Physical Therapy Treatment  Patient Details  Name: Rose Bernard MRN: [id number] Date of Birth: Jun 10, 1966 No Data Recorded  Encounter Date: 11/27/2015      PT End of Session - 12/01/15 1140    Visit Number 3   Number of Visits 12   Date for PT Re-Evaluation 12/09/15   PT Start Time 1400   PT Stop Time 1500   PT Time Calculation (min) 60 min   Activity Tolerance Patient tolerated treatment well;No increased pain   Behavior During Therapy [facility] for tasks assessed/performed      Past Medical History  Diagnosis Date  . Frequent UTI   . Chronic cystitis   . Incomplete bladder emptying   . 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 Breast surgery Bilateral 2002    benign mass removed  . Bladder botox    . Foot surgery Bilateral   . Breast biopsy Bilateral     neg  . Reconstruction of bladder   1995     2/2 delivery complications with 3rd child   . Appendectomy      2001 due to scar tissue  . Abdominal hysterectomy  1995    2/2 delivery complications with 3rd child. Hysterectomy and bladder repair with subsequent appendectomy. L Ovary remain in place     There were no vitals filed for this visit.  Visit Diagnosis:  Weakness generalized  Coordination impairment  Limited mobility      Subjective Assessment - 12/01/15 1131    Subjective Since last session, pt noticed her muscles were more relaxed. Pt has not had low back nor abdominal pain for a week. Last night, pt started feeling L LQ abdominal pain with achiness in her back  "nagging pain" 4/10. She also feels the pain radiating down to her L front thigh.    Pertinent History Every 9 months, pt received botox over bladder area in order to prevent closure of ureter and to decrease UTI. Prior to this  treatment, pt was getting UTI  6-7x /year. Currently, pt does not have UTI infection according to her visit to see Dr. Jacqlyn Larsen , her urologist  2 weeks ago.     Limitations Sitting;Standing;Walking;House hold activities;Other (comment);Lifting   lifting grandson (30#)    How long can you sit comfortably? 20-30 min   How long can you stand comfortably? 5-10 mins    Patient Stated Goals get better    Pain Onset More than a month ago            [facility] PT Assessment - 12/01/15 1131    Palpation   Spinal mobility T10-T12 hypomobility , referred to L LQ pain   post-Tx: PA mobs at T12 did not refer to L LQ abdomen   Palpation comment noted decreased back mm tensions                     [facility] Adult PT Treatment/Exercise - 12/01/15 1146    Self-Care   Other Self-Care Comments  guided relaxation   in supine w/ moist heat pack under back    Therapeutic Activites    Work Simulation    Neuro Re-ed    Neuro Re-ed Details     Exercises   Other Exercises  see patient instructions   Moist  Heat Therapy   Moist Heat Location Lumbar Spine 5 min   Manual Therapy   Joint Mobilization PA mobs in prone T10-12 Grade II-II with decreased referred pain at L LQ post-Tx                PT Education - 12/01/15 1140    Education provided Yes   Education Details HEP   Person(s) Educated Patient   Methods Explanation;Demonstration;Tactile cues;Verbal cues;Handout   Comprehension Verbalized understanding;Returned demonstration             PT Long Term Goals - 09/24/15 2125    PT LONG TERM GOAL #1   Title Pt will report a decrease on PDU from 60% to < 50% in order to return to ADLs.   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   Time 12   Period Weeks   Status  New   PT LONG TERM GOAL #3   Title Pt will demo proper lifting mechanics with 50# from floor to counter 3 reps and activation of deep core without pain in order to lift grandchildren.   Time 12   Period Weeks   Status New   PT LONG TERM GOAL #4   Title Pt will demo decreased scar immobility along horizontal. longitudinal abdominal scars in order to progress to deep core strengthening.    Time 12   Period Weeks   Status New               Plan - 12/01/15 1142    Clinical Impression Statement Pt's L LQ abdominal pain was reduced completely with manual Tx along thoracic spine. Pt continues to show improvement as she is able to self-correct into a more upright posture but will continue to require more deep core strengthing through skilled PT. Pt will not be able to attend session next week due to the holidays.    Pt will benefit from skilled therapeutic intervention in order to improve on the following deficits Abnormal gait;Decreased activity tolerance;Decreased balance;Decreased mobility;Decreased strength;Postural dysfunction;Improper body mechanics;Hypomobility;Decreased scar mobility;Hypermobility;Pain;Increased muscle spasms;Decreased safety awareness;Decreased endurance;Decreased coordination;Decreased range of motion;Increased fascial restricitons   Rehab Potential Good   Clinical Impairments Affecting Rehab Potential multiple abdominal surgeries, strenuous physical activity on the job as a Building control surveyor   PT Frequency 2x / week   PT Duration 12 weeks   PT Treatment/Interventions ADLs/Self Care Home Management;Aquatic Therapy;Biofeedback;Cryotherapy;Stair training;Electrical Stimulation;Iontophoresis 4mg /ml Dexamethasone;Functional mobility training;Gait training;Moist Heat;Traction;Therapeutic activities;Dry needling;Therapeutic exercise;Energy conservation;Balance training;Neuromuscular re-education;Manual techniques;Taping;Scar mobilization;Passive range of motion   Consulted and Agree  with Plan of Care Patient        Problem List Patient Active Problem List   Diagnosis Date Noted  . Loss of weight 10/02/2015  . Myofascial pain 09/16/2015  . Spasm 09/16/2015  . Chronic female pelvic pain 09/16/2015  . Abdominal pain, left lower quadrant 09/07/2015  . Abnormal mammogram 04/12/2015  . Health care maintenance 04/12/2015  . Sore throat 10/05/2014  . Flank pain 08/21/2014  . Sacral pain 12/29/2013  . Sinus congestion 12/29/2013  . Rib pain 09/29/2013  . FOM (frequency of micturition) 05/15/2013  . Urge incontinence of urine 05/15/2013  . UTI (urinary tract infection) 02/27/2013  . Family history of colon cancer 02/20/2013  . Headache(784.0) 02/11/2013  .  Chronic cystitis 02/11/2013  . History of ovarian cyst 02/11/2013  . Reflux, vesicoureteral 10/15/2012  . Symptoms involving urinary system 10/15/2012  . Mixed incontinence 10/15/2012  . Incomplete bladder emptying 10/15/2012  . Disorder of bladder function 10/15/2012    Jerl Mina ,PT, DPT, E-RYT  12/01/2015, 11:47 AM  [place] MAIN [facility] SERVICES [address] [place], [place], [address] Phone: [phone]   Fax:  [phone]  Name: Rose Bernard MRN: [id number] Date of Birth: 04-Dec-1966

## 2015-11-30 ENCOUNTER — Ambulatory Visit: Payer: BLUE CROSS/BLUE SHIELD | Admitting: Internal Medicine

## 2015-12-02 ENCOUNTER — Encounter: Payer: Self-pay | Admitting: Internal Medicine

## 2015-12-02 ENCOUNTER — Ambulatory Visit (INDEPENDENT_AMBULATORY_CARE_PROVIDER_SITE_OTHER): Payer: BLUE CROSS/BLUE SHIELD | Admitting: Internal Medicine

## 2015-12-02 ENCOUNTER — Encounter: Payer: BLUE CROSS/BLUE SHIELD | Admitting: Physical Therapy

## 2015-12-02 VITALS — BP 94/60 | HR 63 | Temp 97.6°F | Resp 18 | Ht 65.0 in | Wt 119.5 lb

## 2015-12-02 DIAGNOSIS — R1032 Left lower quadrant pain: Secondary | ICD-10-CM

## 2015-12-02 DIAGNOSIS — Z800 Family history of malignant neoplasm of digestive organs: Secondary | ICD-10-CM

## 2015-12-02 DIAGNOSIS — N3020 Other chronic cystitis without hematuria: Secondary | ICD-10-CM

## 2015-12-02 DIAGNOSIS — Z8742 Personal history of other diseases of the female genital tract: Secondary | ICD-10-CM

## 2015-12-02 DIAGNOSIS — J029 Acute pharyngitis, unspecified: Secondary | ICD-10-CM

## 2015-12-02 DIAGNOSIS — R634 Abnormal weight loss: Secondary | ICD-10-CM

## 2015-12-02 DIAGNOSIS — R928 Other abnormal and inconclusive findings on diagnostic imaging of breast: Secondary | ICD-10-CM

## 2015-12-02 NOTE — Progress Notes (Signed)
Patient ID: Rose Bernard, female   DOB: 30-Apr-1966, 49 y.o.   MRN: [id number]   Subjective:    Patient ID: Rose Bernard, female    DOB: 24-May-1966, 49 y.o.   MRN: [id number]  HPI  Patient with past history of chronic cystitis and incomplete bladder emptying, urge incontinence and vesicoureteral reflux.  She comes in today for a scheduled follow up.  She has been doing pelvic floor exercises.  This has helped the previous pain that she was experiencing.  She has noticed over the last few days some increased pain - left pubic pain (pain just above the pubic bone).  She has had multiple surgeries.  Scarring in the area.  Question of hernia.  She is eating better.  No nausea or vomiting.  Bowels stable.  Just saw Dr Jacqlyn Larsen.  See his note for details.  Felt stable.  Per his note, the ovarian cystic mass was smaller in size.  Will need to be followed.  She has seen gyn.  Breathing is stable.  Some sore throat earlier.  Appears to be better now.  Some drainage.     Past Medical History  Diagnosis Date  . Frequent UTI   . Chronic cystitis   . Incomplete bladder emptying   . 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 Breast surgery Bilateral 2002    benign mass removed  . Bladder botox    . Foot surgery Bilateral   . Breast biopsy Bilateral     neg  . Reconstruction of bladder   1995     2/2 delivery complications with 3rd child   . Appendectomy      2001 due to scar tissue  . Abdominal hysterectomy  1995    2/2 delivery complications with 3rd child. Hysterectomy and bladder repair with subsequent appendectomy. L Ovary remain in place    Family History  Problem Relation Age of Onset  . Colon cancer Father     With ostomy in place   . Breast cancer Paternal Aunt 59   Social History   Social History  . Marital Status: Married    Spouse Name: N/A  . Number of Children: 2  . Years of Education: N/A   Social History Main  Topics  . Smoking status: Never Smoker   . Smokeless tobacco: Never Used  . Alcohol Use: No  . Drug Use: No  . Sexual Activity: Yes    Birth Control/ Protection: Surgical   Other Topics Concern  . None   Social History Narrative    Outpatient Encounter Prescriptions as of 12/02/2015  Medication Sig  . [DISCONTINUED] nitrofurantoin, macrocrystal-monohydrate, (MACROBID) 100 MG capsule Take 100 mg by mouth 2 (two) times daily.   No facility-administered encounter medications on file as of 12/02/2015.    Review of Systems  Constitutional: Positive for unexpected weight change.       Had been losing weight.  States she is eating better now. Weight as outlined.    HENT: Positive for sore throat. Negative for sinus pressure.        Minimal congestion and drainage.   Respiratory: Negative for cough, chest tightness and shortness of breath.   Cardiovascular: Negative for chest pain, palpitations and leg swelling.  Gastrointestinal: Negative for nausea, vomiting and diarrhea.       Some pain localized to just above the pubic bone (left).   Genitourinary: Negative for dysuria and difficulty urinating.  Musculoskeletal: Negative for back pain and joint swelling.  Skin: Negative for color change and rash.  Neurological: Negative for dizziness, light-headedness and headaches.  Psychiatric/Behavioral: Negative for dysphoric mood and agitation.       Objective:    Physical Exam  Constitutional: No distress.  HENT:  Nose: Nose normal.  Mouth/Throat: Oropharynx is clear and moist. No oropharyngeal exudate.  Eyes: Conjunctivae are normal. Right eye exhibits no discharge. Left eye exhibits no discharge.  Neck: Neck supple. No thyromegaly present.  Cardiovascular: Normal rate and regular rhythm.   Pulmonary/Chest: Breath sounds normal. No respiratory distress. She has no wheezes.  Abdominal: Soft. Bowel sounds are normal.  Minimal tenderness to palpation LLQ - just above pubic bone -  left. Question of hernia present.  Well healed incision sites.    Musculoskeletal: She exhibits no edema or tenderness.  Lymphadenopathy:    She has no cervical adenopathy.  Skin: No rash noted. No erythema.  Psychiatric: She has a normal mood and affect. Her behavior is normal.    BP 94/60 mmHg  Pulse 63  Temp(Src) 97.6 F (36.4 C) (Oral)  Resp 18  Ht 5\' 5"  (1.651 m)  Wt 119 lb 8 oz (54.205 kg)  BMI 19.89 kg/m2  SpO2 98%  LMP 02/07/1994 Wt Readings from Last 3 Encounters:  12/02/15 119 lb 8 oz (54.205 kg)  10/13/15 115 lb 3.2 oz (52.254 kg)  10/01/15 114 lb 8 oz (51.937 kg)     Lab Results  Component Value Date   WBC 5.3 10/01/2015   HGB 14.0 10/01/2015   HCT 42.1 10/01/2015   PLT 238.0 10/01/2015   GLUCOSE 82 10/01/2015   CHOL 180 10/16/2014   TRIG 46.0 10/16/2014   HDL 55.80 10/16/2014   LDLCALC 115* 10/16/2014   ALT 14 10/01/2015   AST 14 10/01/2015   NA 138 10/01/2015   K 4.4 10/01/2015   CL 102 10/01/2015   CREATININE 0.79 10/01/2015   BUN 15 10/01/2015   CO2 28 10/01/2015   TSH 2.03 10/01/2015   HGBA1C 5.6 10/01/2015    Ct Abdomen Pelvis W Contrast  09/29/2015  CLINICAL DATA:  Painful palpable mass anterior abdomen. EXAM: CT ABDOMEN AND PELVIS WITH CONTRAST TECHNIQUE: Multidetector CT imaging of the abdomen and pelvis was performed using the standard protocol following bolus administration of intravenous contrast. CONTRAST:  5mL OMNIPAQUE IOHEXOL 350 MG/ML SOLN COMPARISON:  Ultrasound today.  CT abdomen pelvis 07/14/2006 FINDINGS: Lower chest:  Lung bases clear Hepatobiliary: Small hepatic cysts. No liver mass. Gallbladder and bile ducts normal. Pancreas: Negative Spleen: Negative Adrenals/Urinary Tract: Parapelvic cyst on the left. No renal obstruction or mass. 3 mm nonobstructing calculus right mid pole. Sub cm left renal cyst. Stomach/Bowel: Negative for bowel obstruction. No bowel edema. Prior appendectomy. Vascular/Lymphatic:  Negative Reproductive: Left  adnexal cyst measures 22 mm. This is significantly smaller when than the prior CT when a large complex cyst was present in the left adnexa. Hysterectomy changes. Other: Palpable abnormality corresponds to subcutaneous nodule in the anterior abdominal wall mid pelvis. This measures 12 mm. Based on ultrasound this appears either solid or complex cystic. This could represent infection or neoplasm. If this does not resolve with antibiotics, biopsy is recommended. No intra-abdominal adenopathy is identified. Musculoskeletal: Mild lumbar disc degeneration. No fracture or acute bone lesion. IMPRESSION: Palpable abnormality corresponds to a 12 mm subcutaneous nodule in the anterior abdominal wall mid pelvis. This appears solid based on ultrasound. This could represent infection or metastatic disease. Biopsy recommended  if this is not respond to antibiotics. Nonobstructing right renal calculus. 2 cm left adnexal cyst has improved significantly in size since prior CT. Electronically Signed   By: Franchot Gallo M.D.   On: 09/29/2015 13:20   US Abdomen Limited  09/29/2015  CLINICAL DATA:  Tender lump on abdominal wall. EXAM: LIMITED ABDOMINAL ULTRASOUND COMPARISON:  CT scan of July 14, 2006. FINDINGS: Complex but predominantly solid mass is noted in the anterior abdominal wall just inferior to the umbilicus which measures 1.3 x 1.2 x 0.9 cm. Doppler demonstrates internal blood flow. IMPRESSION: 1.3 cm complex but predominantly solid mass is noted in the anterior abdominal wall inferior to the umbilicus in area of palpable concern. It does demonstrate some internal flow on Doppler. This is concerning for possible neoplasm and tissue sampling is recommended. Electronically Signed   By: Marijo Conception, M.D.   On: 09/29/2015 09:46       Assessment & Plan:   Problem List Items Addressed This Visit    Abdominal pain, left lower quadrant    The previous pain she was experiencing - better with pelvic floor exercises.  She  is now having a pain left pubic area.  Minimal tenderness to palpation.  This is not the area of concern on the recent ultrasound.  States that resolved.  Question of small hernia.  Discussed surgery reevaluation.  Desires to see Dr Bary Castilla.  Will place order for referral for his review.  Does have the left ovarian cystic mass.  Saw gyn.  Saw Dr Jacqlyn Larsen.  Per his review, smaller.  Will need to be followed.        Relevant Orders   Ambulatory referral to General Surgery   Abnormal mammogram    Mammogram 1/11/6 - screening mammogram recommended f/u views (right breast).  Right breast mammogram - birads III.  Recommended f/u right breast mammo in 6 months.  Mammogram 07/10/15 - ok.  Recommended f/u screening in 12/2015.  Need to schedule if not scheduled.        Chronic cystitis - Primary    Off macrobid.  Sees Dr Jacqlyn Larsen.  Appears to be stable.  See Dr Cope's note.        Family history of colon cancer    Colonoscopy 02/13/13 - internal hemorrhoids otherwise normal.  Recommended f/u colonoscopy in five years.        History of ovarian cyst    CT as outlined.  Was referred to gyn.  Per her report no further w/up at this time warranted.  Refer to Dr Cope's note for details.  His review - felt to be smaller.  Will need to be followed.        Loss of weight    Had lost 15-20 pounds over the last two years.  Recent labs ok.  Had recent CT as outlined.  The previous mid abdominal wall lesion resolved.  Saw gyn.  Sees Dr Jacqlyn Larsen.  Per report, the left ovarian cystic mass has decreased in size.  States she is eating better.  Weight is up a few pounds from the previous check.  Discussed GI referral and w/up.  She wants to hold at this point.  Follow.        Sore throat    Not as sore now.  No redness or evidence of infection.  Drainage may be aggravating.  Saline nasal spray and nasacort as directed.  Follow.            Diona Peregoy,  [place] Patient, MD

## 2015-12-02 NOTE — Progress Notes (Signed)
Pre-visit discussion using our clinic review tool. No additional management support is needed unless otherwise documented below in the visit note.  

## 2015-12-04 ENCOUNTER — Encounter: Payer: Self-pay | Admitting: Internal Medicine

## 2015-12-04 ENCOUNTER — Encounter: Payer: Self-pay | Admitting: General Surgery

## 2015-12-04 NOTE — Assessment & Plan Note (Signed)
Colonoscopy 02/13/13 - internal hemorrhoids otherwise normal.  Recommended f/u colonoscopy in five years.

## 2015-12-04 NOTE — Assessment & Plan Note (Signed)
Off macrobid.  Sees Dr Jacqlyn Larsen.  Appears to be stable.  See Dr Cope's note.

## 2015-12-04 NOTE — Assessment & Plan Note (Signed)
Mammogram 1/11/6 - screening mammogram recommended f/u views (right breast).  Right breast mammogram - birads III.  Recommended f/u right breast mammo in 6 months.  Mammogram 07/10/15 - ok.  Recommended f/u screening in 12/2015.  Need to schedule if not scheduled.

## 2015-12-04 NOTE — Assessment & Plan Note (Signed)
Not as sore now.  No redness or evidence of infection.  Drainage may be aggravating.  Saline nasal spray and nasacort as directed.  Follow.

## 2015-12-04 NOTE — Assessment & Plan Note (Signed)
The previous pain she was experiencing - better with pelvic floor exercises.  She is now having a pain left pubic area.  Minimal tenderness to palpation.  This is not the area of concern on the recent ultrasound.  States that resolved.  Question of small hernia.  Discussed surgery reevaluation.  Desires to see Dr Bary Castilla.  Will place order for referral for his review.  Does have the left ovarian cystic mass.  Saw gyn.  Saw Dr Jacqlyn Larsen.  Per his review, smaller.  Will need to be followed.

## 2015-12-04 NOTE — Assessment & Plan Note (Signed)
CT as outlined.  Was referred to gyn.  Per her report no further w/up at this time warranted.  Refer to Dr Cope's note for details.  His review - felt to be smaller.  Will need to be followed.

## 2015-12-04 NOTE — Assessment & Plan Note (Signed)
Had lost 15-20 pounds over the last two years.  Recent labs ok.  Had recent CT as outlined.  The previous mid abdominal wall lesion resolved.  Saw gyn.  Sees Dr Jacqlyn Larsen.  Per report, the left ovarian cystic mass has decreased in size.  States she is eating better.  Weight is up a few pounds from the previous check.  Discussed GI referral and w/up.  She wants to hold at this point.  Follow.

## 2015-12-08 ENCOUNTER — Ambulatory Visit: Payer: BLUE CROSS/BLUE SHIELD | Admitting: Internal Medicine

## 2015-12-09 ENCOUNTER — Ambulatory Visit: Payer: BLUE CROSS/BLUE SHIELD | Admitting: Physical Therapy

## 2015-12-09 ENCOUNTER — Telehealth: Payer: Self-pay | Admitting: Physical Therapy

## 2015-12-09 ENCOUNTER — Telehealth: Payer: Self-pay | Admitting: Internal Medicine

## 2015-12-09 DIAGNOSIS — Z7409 Other reduced mobility: Secondary | ICD-10-CM

## 2015-12-09 DIAGNOSIS — R531 Weakness: Secondary | ICD-10-CM | POA: Diagnosis not present

## 2015-12-09 DIAGNOSIS — R278 Other lack of coordination: Secondary | ICD-10-CM

## 2015-12-09 NOTE — Telephone Encounter (Signed)
PT called Dr. Bary Leriche office and spoke with her directly to update her re: pt's response to PT's session today and POC.

## 2015-12-09 NOTE — Telephone Encounter (Signed)
Physical therapy called wanting to discuss patients progress in physical therapy.

## 2015-12-10 NOTE — Therapy (Addendum)
[place] [facility] SERVICES [address] [place], [place], [address] Phone: [phone]   Fax:  [phone]  Physical Therapy Treatment  Patient Details  Name: Rose Bernard MRN: [id number] Date of Birth: [birth year] No Data Recorded  Encounter Date: 12/09/2015      PT End of Session - 12/10/15 1350    Visit Number 4   Number of Visits 12   Date for PT Re-Evaluation 12/09/15   PT Start Time 1010   PT Stop Time 1115   PT Time Calculation (min) 65 min   Activity Tolerance Patient tolerated treatment well;No increased pain   Behavior During Therapy [facility] for tasks assessed/performed      Past Medical History  Diagnosis Date  . Frequent UTI   . Chronic cystitis   . Incomplete bladder emptying   . 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 Breast surgery Bilateral 2002    benign mass removed  . Bladder botox    . Foot surgery Bilateral   . Breast biopsy Bilateral     neg  . Reconstruction of bladder   1995     2/2 delivery complications with 3rd child   . Appendectomy      2001 due to scar tissue  . Abdominal hysterectomy  1995    2/2 delivery complications with 3rd child. Hysterectomy and bladder repair with subsequent appendectomy. L Ovary remain in place     There were no vitals filed for this visit.  Visit Diagnosis:  Weakness generalized  Coordination impairment  Limited mobility      Subjective Assessment - 12/09/15 1011    Subjective Pt reported after last session, pt felt a L lower suprapubic area "7/10, dull sharp" that "shoots down" her anterior thigh above the knee. Driving,  sitting,walking,  and urinating cause it to get worse. This pain has persisted for one week and has interrupted her sleep. Pt was seen by her PCP about this complaint. Today sitting in PT office, she reports a 5-6/10 pain.     Pertinent History Every 9 months, pt received  botox over bladder area in order to prevent closure of ureter and to decrease UTI. Prior to this treatment, pt was getting UTI  6-7x /year. Currently, pt does not have UTI infection according to her visit to see Dr. Jacqlyn Larsen , her urologist  2 weeks ago.     Limitations Sitting;Standing;Walking;House hold activities;Other (comment);Lifting   lifting grandson (30#)    How long can you sit comfortably? 20-30 min   How long can you stand comfortably? 5-10 mins    Patient Stated Goals get better    Pain Onset More than a month ago            [facility] PT Assessment - 12/10/15 1335    Posture/Postural Control   Posture Comments pre-Tx: back pain with cue to correct slumped posture, post-Tx: decreased back pain and able to maintain neutral spinal curves    Palpation   Spinal mobility Increased mobility at T10-12 compared to last session, quickly centralized with Grade II mobs 30 sec 1 rep PA on SP ; post-Tx: no referring pain to L LQ    Palpation comment increased scar immobility on R suprapubic area.  Standing:  noted no bulging area with cue to cough in inguinal area.  [place] Adult PT Treatment/Exercise - 12/10/15 1335    Self-Care   Other Self-Care Comments  explained about scar adhesion and back mm/thoracic spine hypomobility and posture as contributing factors to L LQ pain  explained centralization of radiating pain as progress   Neuro Re-ed    Neuro Re-ed Details  guided scar massage w/ very light touch, proper sitting posture, modified open book exercise as pt reported pain with full ROM and pt did not report pain when ROM was decreased 75% trunk rotation    Exercises   Other Exercises  pelvic tilt, neck, shoulder, ankle, ROM prior to scar massage for muscle pumping , scar massage instruction with tactile cuing, maintain deep core exercises level 2, practice proper sitting posture     Manual Therapy   Joint Mobilization PA mobs in prone T10-12 Grade II with  decreased referred pain at L LQ post-Tx   Myofascial Release over L abdominal scar, no pain reported during Tx but needed to be cued to pre-set spine in neutral in hooklying to decrease back spasms                PT Education - 12/10/15 1349    Education provided Yes   Education Details HEP   Person(s) Educated Patient   Methods Explanation;Demonstration;Tactile cues;Verbal cues;Handout   Comprehension Verbalized understanding;Returned demonstration             PT Long Term Goals - 12/10/15 1403    PT LONG TERM GOAL #1   Title Pt will report a decrease on [place] from 60% to < 50% in order to return to ADLs.   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   Time 12   Period Weeks   Status On-going   PT LONG TERM GOAL #3   Title Pt will demo proper lifting mechanics with 50# from floor to counter 3 reps and activation of deep core without pain in order to lift grandchildren.   Time 12   Period Weeks   Status On-going   PT LONG TERM GOAL #4   Title Pt will demo decreased scar immobility along horizontal. longitudinal abdominal scars in order to progress to deep core strengthening.    Time 12   Period Weeks   Status On-going   PT LONG TERM GOAL #5   Title Pt will demo ability to perform full ROM in openbook exercise with trunk rotation R and L 10 reps without c/o pain in order to restore spinal movements for functional activities.    Time 12   Period Weeks   Status New   Additional Long Term Goals   Additional Long Term Goals Yes   PT LONG TERM GOAL #6   Title Pt will demo proper sitting posture without verbal cuing across 2 visits in order to minimize relapse of Sx.    Time 12   Period Weeks   Status New               Plan - 12/09/15  1516    Clinical Impression Statement Suspect pt's Sx will continue to improve by addressing pt's thoracic hypomobile segments and abdominal scar adhesions in order correct pt's slumped posture.  Pt's L LQ abdominal pain decreased from 5-6/10 to  a 3/10 pain without radiating Sx after manual Tx. Pt's thoracic segments showed increased mobility compared to last session and mobilizations today continued to reduced radiating symptom. Pt  reported feeling relief over L LQ area with light gliding scar massage. Post-Tx: pt gained ability to maintaing upright sitting posture with neutral spinal curves. Suspect pt report of pain with open book exercise is due to abdominal scar adhesions limiting full range of trunk rotation and thus, exercise was downgraded to within a tolerable range of ROM.   PT notified her PCP re: her response to Tx and future plan to address scar adhesions and spinal deficits which PT suspect to be the cause for her pain.  Pt  will continue to monitor pt's response and refer out when appropriate.  Anticipate pt wil continue to progress well as long as pt remains compliant.  Re-cert is needed to continue helping pt progress towards her goals.    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 Loss of weight 10/02/2015  . Myofascial pain 09/16/2015  . Spasm 09/16/2015  . Chronic female pelvic pain 09/16/2015  . Abdominal pain, left lower quadrant 09/07/2015  . Abnormal mammogram 04/12/2015  . Health care maintenance 04/12/2015  . Sore throat 10/05/2014  . Flank pain 08/21/2014  . Sacral pain 12/29/2013  . Sinus congestion 12/29/2013  . Rib pain 09/29/2013  . FOM (frequency of micturition) 05/15/2013  . Urge incontinence of urine 05/15/2013  . UTI (urinary tract infection) 02/27/2013  . Family history of colon cancer 02/20/2013  . Headache(784.0) 02/11/2013  . Chronic cystitis 02/11/2013  . History of ovarian cyst 02/11/2013  . Reflux, vesicoureteral 10/15/2012  . Symptoms involving urinary system 10/15/2012  . Mixed incontinence 10/15/2012  . Incomplete bladder emptying 10/15/2012  . Disorder of bladder function 10/15/2012    Jerl Mina ,PT, DPT, E-RYT  12/10/2015, 2:06 PM  [place] MAIN [facility] SERVICES [address] [place], [place], [address] Phone: [phone]   Fax:  [phone]  Name: Rose Bernard MRN: [id number] Date of Birth: 24-Oct-1966

## 2015-12-10 NOTE — Patient Instructions (Addendum)
pelvic tilt, neck, shoulder, ankle, ROM prior to scar massage for muscle pumping (handout)  scar massage-light gliding pressure maintain deep core exercises level 2 and open book (modify by not opening as far in order to minimize pain)  practice proper sitting posture

## 2015-12-16 ENCOUNTER — Ambulatory Visit: Payer: BLUE CROSS/BLUE SHIELD | Attending: Student | Admitting: Physical Therapy

## 2015-12-16 DIAGNOSIS — Z7409 Other reduced mobility: Secondary | ICD-10-CM

## 2015-12-16 DIAGNOSIS — R278 Other lack of coordination: Secondary | ICD-10-CM | POA: Diagnosis present

## 2015-12-16 DIAGNOSIS — R531 Weakness: Secondary | ICD-10-CM | POA: Diagnosis not present

## 2015-12-16 NOTE — Patient Instructions (Signed)
PELVIC FLOOR / KEGEL EXERCISES   Pelvic floor/ Kegel exercises are used to strengthen the muscles in the base of your pelvis that are responsible for supporting your pelvic organs and preventing urine/feces leakage. Based on your therapist's recommendations, they can be performed while standing, sitting, or lying down.  Make yourself aware of this muscle group by using these cues:  Imagine you are in a crowded room and you feel the need to pass gas. Your response is to pull up and in at the rectum.  Close the rectum. Pull the muscles up inside your body,feeling your scrotum lifting as well . Feel the pelvic floor muscles lift as if you were walking into a cold lake.  Place your hand on top of your pubic bone. Tighten and draw in the muscles around the anal muscles without squeezing the buttock muscles.  Common Errors:  Breath holding: If you are holding your breath, you may be bearing down against your bladder instead of pulling it up. If you belly bulges up while you are squeezing, you are holding your breath. Be sure to breathe gently in and out while exercising. Counting out loud may help you avoid holding your breath.  Accessory muscle use: You should not see or feel other muscle movement when performing pelvic floor exercises. When done properly, no one can tell that you are performing the exercises. Keep the buttocks, belly and inner thighs relaxed.  Overdoing it: Your muscles can fatigue and stop working for you if you over-exercise. You may actually leak more or feel soreness at the lower abdomen or rectum.  YOUR HOME EXERCISE PROGRAM  LONG HOLDS: Position: on back  Inhale and then exhale. Then squeeze the muscle and count aloud for 10 seconds. Rest with three long breaths. 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

## 2015-12-17 ENCOUNTER — Encounter: Payer: BLUE CROSS/BLUE SHIELD | Admitting: Physical Therapy

## 2015-12-17 NOTE — Therapy (Signed)
[place] MAIN [facility] SERVICES [address] [place], [place], [address] Phone: [phone]   Fax:  [phone]  Physical Therapy Treatment  Patient Details  Name: Rose Bernard MRN: [id number] Date of Birth: [birth year] No Data Recorded  Encounter Date: 12/16/2015      PT End of Session - 12/17/15 2220    Visit Number 5   Number of Visits 12   Date for PT Re-Evaluation 12/09/15   PT Start Time 1510   PT Stop Time 1609   PT Time Calculation (min) 59 min   Activity Tolerance Patient tolerated treatment well;No increased pain   Behavior During Therapy [facility] for tasks assessed/performed      Past Medical History  Diagnosis Date  . Frequent UTI   . Chronic cystitis   . Incomplete bladder emptying   . 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 Breast surgery Bilateral 2002    benign mass removed  . Bladder botox    . Foot surgery Bilateral   . Breast biopsy Bilateral     neg  . Reconstruction of bladder   1995     2/2 delivery complications with 3rd child   . Appendectomy      2001 due to scar tissue  . Abdominal hysterectomy  1995    2/2 delivery complications with 3rd child. Hysterectomy and bladder repair with subsequent appendectomy. L Ovary remain in place     There were no vitals filed for this visit.  Visit Diagnosis:  Weakness generalized  Coordination impairment  Limited mobility      Subjective Assessment - 12/16/15 1511    Subjective Pt reported for three days, she did not have radiating pain down the L thigh. The past three days, pt has felt dull ache in bilaterally suprepubic and increased urinary leakage with sit-to-stand.    Pertinent History Every 9 months, pt received botox over bladder area in order to prevent closure of ureter and to decrease UTI. Prior to this treatment, pt was getting UTI  6-7x /year. Currently, pt does not have UTI infection  according to her visit to see Dr. Jacqlyn Larsen , her urologist  2 weeks ago.     Limitations Sitting;Standing;Walking;House hold activities;Other (comment);Lifting   lifting grandson (30#)    How long can you sit comfortably? 20-30 min   How long can you stand comfortably? 5-10 mins    Patient Stated Goals get better    Pain Onset More than a month ago            [facility] PT Assessment - 12/17/15 2159    Observation/Other Assessments   Observations improved upright posture with minor cuing   decreased crossing of arms    Palpation   Spinal mobility decreased spinal hypomobility and mm tensions compared to last session   Palpation comment decreased scar mobility over abdomen, increased fascial restriction under anterior ribcage/sternum w/ limited anterior / posterior diaphragmatic expansion,   reported decreased lower abdominal pain post-Tx                  Pelvic Floor Special Questions - 12/17/15 2224    Pelvic Floor Internal Exam pt verbally consented without contraindications    Exam Type Vaginal   Strength fair squeeze, definite lift  initially 1/5 1st layer, post-Tx: 3/5 all 3 layers    Strength # of reps 3   Strength # of seconds 10  Biofeedback guided imagery, elevator technique, pelvic floor coordination with breathing , pt had no difficulty            [facility] Adult PT Treatment/Exercise - 12/17/15 2200    Therapeutic Activites    Work Simulation semi-tandem stance with weight shift with simulated lifting    body mechanics/principles details    Neuro Re-ed    Neuro Re-ed Details  diaphragmatic explansion anteriorly/posteriorly with explanation of anatomy and physiology    Manual Therapy   Myofascial Release over abdomen, ribcage   very light glides, increased pelvic floor strength post-Tx   Internal Pelvic Floor faciliation of urethrae compressor mm bilaterally to promote circumferential contraction                     PT Long Term Goals - 12/17/15  2228    PT LONG TERM GOAL #1   Title Pt will report a decrease on [place] from 60% to < 50% in order to return to ADLs.   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   Time 12   Period Weeks   Status On-going   PT LONG TERM GOAL #3   Title Pt will demo proper lifting mechanics with 50# from floor to counter 3 reps and activation of deep core without pain in order to lift grandchildren.   Time 12   Period Weeks   Status On-going   PT LONG TERM GOAL #4   Title Pt will demo decreased scar immobility along horizontal. longitudinal abdominal scars in order to progress to deep core strengthening.    Time 12   Period Weeks   Status Achieved   PT LONG TERM GOAL #5   Title Pt will demo ability to perform full ROM in openbook exercise with trunk rotation R and L 10 reps without c/o pain in order to restore spinal movements for functional activities.    Time 12   Period Weeks   Status New   Additional Long Term Goals   Additional Long Term Goals Yes   PT LONG TERM GOAL #6   Title Pt will demo proper sitting posture without verbal cuing across 2 visits in order to minimize relapse of Sx.    Time 12   Period Weeks   Status Achieved   PT LONG TERM GOAL #7   Title Pt will demo Grade 3/10/7/7 in order to minimize leakage and improve QOL.   Time 12   Period Weeks   Status New               Plan - 12/16/15 1612    Clinical Impression Statement Pt reported no pain at bilateraly suprapubic area post-Tx. Pt showed increased fascial mobility over ribcage/strnum, softer abdomen, and more diaphragmatic movement anterior/posterly with manual Tx. These improvements helped to increase pt's pelvic floor strength from 2/5 to 3/5 Grade based on PERFect scheme scale. Pt is  progressing well towards her goals.   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 Loss of weight 10/02/2015  . Myofascial pain 09/16/2015  . Spasm 09/16/2015  . Chronic female pelvic pain 09/16/2015  . Abdominal pain, left lower quadrant 09/07/2015  . Abnormal mammogram 04/12/2015  . Health care maintenance 04/12/2015  . Sore throat 10/05/2014  . Flank pain 08/21/2014  . Sacral pain 12/29/2013  . Sinus congestion 12/29/2013  . Rib pain 09/29/2013  . FOM (frequency of micturition) 05/15/2013  . Urge incontinence of urine 05/15/2013  . UTI (urinary tract infection) 02/27/2013  . Family history of colon cancer 02/20/2013  . Headache(784.0) 02/11/2013  . Chronic cystitis 02/11/2013  . History of ovarian cyst 02/11/2013  . Reflux, vesicoureteral  10/15/2012  . Symptoms involving urinary system 10/15/2012  . Mixed incontinence 10/15/2012  . Incomplete bladder emptying 10/15/2012  . Disorder of bladder function 10/15/2012    Jerl Mina ,PT, DPT, E-RYT  12/17/2015, 10:43 PM  [place] MAIN [facility] SERVICES [address] [place], [place], [address] Phone: [phone]   Fax:  [phone]  Name: Rose Bernard MRN: [id number] Date of Birth: [birth year]

## 2015-12-21 ENCOUNTER — Ambulatory Visit: Payer: Self-pay | Admitting: General Surgery

## 2015-12-23 ENCOUNTER — Encounter: Payer: Self-pay | Admitting: General Surgery

## 2015-12-23 ENCOUNTER — Encounter: Payer: BLUE CROSS/BLUE SHIELD | Admitting: Physical Therapy

## 2015-12-23 ENCOUNTER — Ambulatory Visit (INDEPENDENT_AMBULATORY_CARE_PROVIDER_SITE_OTHER): Payer: BLUE CROSS/BLUE SHIELD | Admitting: General Surgery

## 2015-12-23 VITALS — BP 100/68 | HR 70 | Resp 12 | Ht 65.0 in | Wt 120.0 lb

## 2015-12-23 DIAGNOSIS — R1032 Left lower quadrant pain: Secondary | ICD-10-CM | POA: Insufficient documentation

## 2015-12-23 DIAGNOSIS — R1030 Lower abdominal pain, unspecified: Secondary | ICD-10-CM

## 2015-12-23 NOTE — Patient Instructions (Signed)
Patient to return as needed. 

## 2015-12-23 NOTE — Progress Notes (Signed)
Patient ID: Rose Bernard, female   DOB: Mar 26, 1966, 50 y.o.   MRN: [id number]  Chief Complaint  Patient presents with  . Abdominal Pain    HPI Rose Bernard is a 50 y.o. female.  here today for evaluation of abdominal pain for a couple of months. She states the pain is located in her left lower quadrant and has in the past extended below the inguinal ligament into the anterior proximal thigh. She reports it may last for hours, but is typically more short-lived. In October 2016 the patient noted marked swelling just to the right of the midline approximately the midportion of her lower midline incision from years ago when she suffered injury to the uterus and bladder during a forceps delivery. This area had previously been assessed through this office in 2009 at which time, in January 2009, a 5 mm palpable thickening thought likely to represent a retained suture was identified. It was in this area that the patient noted marked swelling and a bluish discoloration in October resulting in her presentation to the emergency department. She had a photograph on her phone of the area and based on the associated scars this corresponds to the area where the suture was palpated in 2009.  She had a ct scan and ultrasound done on 09/19/15. No nausea or vomiting.   Bowel movements every two to three days.   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  I personally reviewed the patient's history. HPI  Past Medical History  Diagnosis Date  . Frequent UTI   . Chronic cystitis   . Incomplete bladder emptying   . 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 Breast surgery Bilateral 2002    benign mass removed  . Bladder botox  2014, 2015, 2016  . Foot surgery Bilateral   . Breast biopsy Bilateral     neg  . Reconstruction of bladder   1995     2/2 delivery complications with 3rd child   . Appendectomy      2001 due to scar  tissue  . Abdominal hysterectomy  1995    2/2 delivery complications with 3rd child. Hysterectomy and bladder repair with subsequent appendectomy. L Ovary remain in place   . Laparoscopic hysterectomy  1995  . Colonoscopy  2014    Family History  Problem Relation Age of Onset  . Colon cancer Father     With ostomy in place   . Breast cancer Paternal Aunt 69    Social History Social History  Substance Use Topics  . Smoking status: Never Smoker   . Smokeless tobacco: Never Used  . Alcohol Use: No    Allergies  Allergen Reactions  . Penicillin V Potassium     Current Outpatient Prescriptions  Medication Sig Dispense Refill  . nitrofurantoin (MACRODANTIN) 100 MG capsule Take 100 mg by mouth as needed.     No current facility-administered medications for this visit.    Review of Systems Review of Systems  Constitutional: Positive for unexpected weight change.  HENT: Negative.   Eyes: Negative.   Respiratory: Negative.   Cardiovascular: Negative.   Gastrointestinal: Positive for abdominal pain. Negative for nausea, vomiting, diarrhea and constipation.  Endocrine: Negative.   Allergic/Immunologic: Negative.   Neurological: Negative.   Hematological: Negative.     Blood pressure 100/68, pulse 70, resp. rate 12, height 5\' 5"  (1.651 m), weight 120 lb (54.432 kg), last menstrual  period 02/07/1994.  At the time of the patient's examination in January 2009 her weight was 130 pounds.  Physical Exam Physical Exam  Constitutional: She is oriented to person, place, and time. She appears well-developed and well-nourished.  Eyes: Conjunctivae are normal. No scleral icterus.  Neck: Neck supple.  Cardiovascular: Normal rate, regular rhythm and normal heart sounds.   Pulses:      Femoral pulses are 2+ on the right side, and 2+ on the left side.      Dorsalis pedis pulses are 2+ on the right side, and 2+ on the left side.  Pulmonary/Chest: Effort normal and breath sounds normal.   Abdominal: Soft. Normal appearance and bowel sounds are normal. There is no hepatomegaly. No hernia.    5 mm nodular over her midline incision.  Lymphadenopathy:    She has no cervical adenopathy.       Right: No inguinal adenopathy present.       Left: No inguinal adenopathy present.  Neurological: She is alert and oriented to person, place, and time.  Skin: Skin is warm and dry.    Data Reviewed Abdominal wall ultrasound and subsequent CT dated 09/29/2015 were reviewed. A complex, predominantly solid mass was reported in the anterior abdominal wall just inferior to the umbilicus measuring up to 1.3 cm in diameter. This did not correspond to the patient's photograph of her abdomen nor to the area of previously noted palpable thickening.  CT scan of the abdomen and pelvis completed the same date ported A 12 mm subcutaneous nodule in the anterior abdominal wall mid pelvic level. A 2 cm adnexal cyst was reported.  Review of the patient's evaluation at [facility] included a pelvic ultrasound that was negative.  Office notes from Phoebe Perch, M.D. completed in October 2016 were reviewed. His examination was suggestive of a hematoma.  Assessment    Right lower quadrant/groin pain of unclear etiology. Unlikely related to the previously identified and now resolved midline abdominal mass.    Plan    No indication for surgical intervention at this time. The patient's abdominal wall exam unchanged from that noted in 2009. Palpation does not reproduce her discomfort which is intermittent in nature.  Possibility of a low back source for her pain is present.  Reason for her weight loss over the past years unclear although it appears to be decreased interest in food rather than any discomfort with dietary intake.      Patient to return as needed. PCP:  Einar Pheasant This information has been scribed by Karie Fetch RNBC.    Robert Bellow 12/23/2015, 8:20 PM

## 2015-12-25 ENCOUNTER — Ambulatory Visit: Payer: BLUE CROSS/BLUE SHIELD | Admitting: Physical Therapy

## 2015-12-25 DIAGNOSIS — Z7409 Other reduced mobility: Secondary | ICD-10-CM

## 2015-12-25 DIAGNOSIS — R531 Weakness: Secondary | ICD-10-CM

## 2015-12-25 DIAGNOSIS — R278 Other lack of coordination: Secondary | ICD-10-CM

## 2015-12-25 NOTE — Therapy (Signed)
[place] MAIN [facility] SERVICES [address] [place], [place], [address] Phone: [phone]   Fax:  [phone]  Physical Therapy Treatment  Patient Details  Name: Rose Bernard MRN: [id number] Date of Birth: [birth year] No Data Recorded  Encounter Date: 12/25/2015    Past Medical History  Diagnosis Date  . Frequent UTI   . Chronic cystitis   . Incomplete bladder emptying   . 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 Breast surgery Bilateral 2002    benign mass removed  . Bladder botox  2014, 2015, 2016  . Foot surgery Bilateral   . Breast biopsy Bilateral     neg  . Reconstruction of bladder   1995     2/2 delivery complications with 3rd child   . Appendectomy      2001 due to scar tissue  . Abdominal hysterectomy  1995    2/2 delivery complications with 3rd child. Hysterectomy and bladder repair with subsequent appendectomy. L Ovary remain in place   . Laparoscopic hysterectomy  1995  . Colonoscopy  2014    There were no vitals filed for this visit.  Visit Diagnosis:  Weakness generalized  Coordination impairment  Limited mobility      Subjective Assessment - 12/25/15 1413    Subjective Pt reports she has not had any radiating Sx down her legs for the past week. After last session, pt 's suprapubic pain resolved for three days until she experienced the sharp pain below her belly button while standing and fixing a sandwich. The pain was severe like it was at the [facility]. Pt's MD has looked at it before, explaining to her that it was caused by a suture and if it causes her pain again, it can get removed. This pain episode disspated quickly after pt  layed down. Pt was able to sleep without interruption of pain.  Pt also reported her MD examined her suprapubic area and did not find any hernias.  Pt feels tired today with a sinus headache. Today, pt feels pain wrapping  around from her low back to  bilateraly suprapubic area.  6/10     Pertinent History Every 9 months, pt received botox over bladder area in order to prevent closure of ureter and to decrease UTI. Prior to this treatment, pt was getting UTI  6-7x /year. Currently, pt does not have UTI infection according to her visit to see Dr. Jacqlyn Larsen , her urologist  2 weeks ago.     Limitations Sitting;Standing;Walking;House hold activities;Other (comment);Lifting   lifting grandson (30#)    How long can you sit comfortably? 20-30 min   How long can you stand comfortably? 5-10 mins    Patient Stated Goals get better    Pain Onset More than a month ago            [facility] PT Assessment - 12/25/15 1959    Coordination   Gross Motor Movements are Fluid and Coordinated --  increased diaphragmatic expansion anteriorly    Fine Motor Movements are Fluid and Coordinated --  abdominal straining w/ breathing   Palpation   Palpation comment decreased scar mobility along distal portion of longitudinal scar of abdomen   fascial mobility noted under ribcage and sternum                     [facility] Adult PT Treatment/Exercise - 12/25/15 2000    Self-Care  Other Self-Care Comments  guided and recorded body scan on pt 's phone   Neuro Re-ed    Neuro Re-ed Details  excessive cuing for abdominopelvic expansion w. inhale instead of chest expansion. and to decrease abdominal straining  reviewed pelvic floor endurance training, posture   Manual Therapy   Myofascial Release gentle pressure for  abdomen and ribcage                      PT Long Term Goals - 12/17/15 2228    PT LONG TERM GOAL #1   Title Pt will report a decrease on [place] from 60% to < 50% in order to return to ADLs.   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   Time 12   Period Weeks   Status On-going   PT LONG TERM GOAL #3   Title Pt will demo proper lifting mechanics with 50# from floor to counter 3 reps and activation of deep core without pain in order to lift grandchildren.   Time 12   Period Weeks   Status On-going   PT LONG TERM GOAL #4   Title Pt will demo decreased scar immobility along horizontal. longitudinal abdominal scars in order to progress to deep core strengthening.    Time 12   Period Weeks   Status Achieved   PT LONG TERM GOAL #5   Title Pt will demo ability to perform full ROM in openbook exercise with trunk rotation R and L 10 reps without c/o pain in order to restore spinal movements for functional activities.    Time 12   Period Weeks   Status New   Additional Long Term Goals   Additional Long Term Goals Yes   PT LONG TERM GOAL #6   Title Pt will demo proper sitting posture without verbal cuing across 2 visits in order to minimize relapse of Sx.    Time 12   Period Weeks   Status Achieved   PT LONG TERM GOAL #7   Title Pt will demo Grade 3/10/7/7 in order to minimize leakage and improve QOL.   Time 12   Period Weeks   Status New               Plan - 12/25/15 2004    Clinical Impression Statement Pt continues to progress well with more centralization to radiating Sx. Pt reported no pain after manual Tx today. Pt shows increased fascial mobility over abdomen but still required light myofascial scar massage. Pt demo'd improved upright posture and was able to sit upright without report of radiating pain which pt was not able to do at previous sessions. Pt responded well to body scan at the end of the session. Pt appeared tired  at the beginning of session but expressed feeling "better" post-Tx.     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 Left groin pain 12/23/2015  . Loss of weight 10/02/2015  . Myofascial pain 09/16/2015  . Spasm 09/16/2015  . Chronic female pelvic pain 09/16/2015  . Abdominal pain, left lower quadrant 09/07/2015  . Abnormal mammogram 04/12/2015  . Health care maintenance 04/12/2015  . Sore throat 10/05/2014  . Flank pain 08/21/2014  . Sacral pain 12/29/2013  . Sinus congestion 12/29/2013  . Rib pain 09/29/2013  . FOM (frequency of micturition) 05/15/2013  . Urge incontinence of urine 05/15/2013  . UTI (urinary tract infection) 02/27/2013  . Family history of colon cancer 02/20/2013  . Headache(784.0) 02/11/2013  . Chronic cystitis 02/11/2013  . History of ovarian cyst 02/11/2013  . Reflux, vesicoureteral 10/15/2012  . Symptoms involving urinary system 10/15/2012  . Mixed incontinence 10/15/2012  . Incomplete bladder emptying 10/15/2012  . Disorder of bladder function  10/15/2012    Jerl Mina ,PT, DPT, E-RYT   12/25/2015, 8:08 PM  [place] MAIN [facility] SERVICES [address] [place], [place], [address] Phone: [phone]   Fax:  [phone]  Name: Rose Bernard MRN: [id number] Date of Birth: 05-31-1966

## 2016-01-01 ENCOUNTER — Encounter: Payer: BLUE CROSS/BLUE SHIELD | Admitting: Physical Therapy

## 2016-01-06 ENCOUNTER — Encounter: Payer: BLUE CROSS/BLUE SHIELD | Admitting: Physical Therapy

## 2016-01-08 ENCOUNTER — Ambulatory Visit: Payer: BLUE CROSS/BLUE SHIELD | Admitting: Physical Therapy

## 2016-01-08 DIAGNOSIS — R531 Weakness: Secondary | ICD-10-CM

## 2016-01-08 DIAGNOSIS — R278 Other lack of coordination: Secondary | ICD-10-CM

## 2016-01-08 DIAGNOSIS — Z7409 Other reduced mobility: Secondary | ICD-10-CM

## 2016-01-08 NOTE — Therapy (Addendum)
[place] MAIN [facility] SERVICES [address] [place], [place], [address] Phone: [phone]   Fax:  [phone]  Physical Therapy Treatment  Patient Details  Name: Rose Bernard MRN: [id number] Date of Birth: Aug 28, 1966 No Data Recorded  Encounter Date: 01/08/2016    Past Medical History  Diagnosis Date  . Frequent UTI   . Chronic cystitis   . Incomplete bladder emptying   . 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 Breast surgery Bilateral 2002    benign mass removed  . Bladder botox  2014, 2015, 2016  . Foot surgery Bilateral   . Breast biopsy Bilateral     neg  . Reconstruction of bladder   1995     2/2 delivery complications with 3rd child   . Appendectomy      2001 due to scar tissue  . Abdominal hysterectomy  1995    2/2 delivery complications with 3rd child. Hysterectomy and bladder repair with subsequent appendectomy. L Ovary remain in place   . Laparoscopic hysterectomy  1995  . Colonoscopy  2014    There were no vitals filed for this visit.  Visit Diagnosis:  Weakness generalized  Coordination impairment  Limited mobility      Subjective Assessment - 01/08/16 1415    Subjective Pt reports her suprapubic pain has decreased by 80%. Currently her low back pain is 8-9/10 for the past couple of days. Pt continues to not have any radiating pain down her leg for the past 4 weeks. Pt's urinary leakage has improved by 40%. Pt suspects maybe the lumbar pain could be a kidney stone  but also stated it could also be her increased work load as she had worked 19 days straight .     Pertinent History Every 9 months, pt received botox over bladder area in order to prevent closure of ureter and to decrease UTI. Prior to this treatment, pt was getting UTI  6-7x /year. Currently, pt does not have UTI infection according to her visit to see Dr. Jacqlyn Larsen , her urologist  2  weeks ago.     Limitations Sitting;Standing;Walking;House hold activities;Other (comment);Lifting   lifting grandson (30#)    How long can you sit comfortably? --   How long can you stand comfortably? --   Patient Stated Goals get better    Pain Onset More than a month ago            [facility] PT Assessment - 01/08/16 1448    Palpation   Spinal mobility improved alignment of shoulder but IR evident bilaterally   Palpation comment decreased scar  restriction; mild                      [facility] Adult PT Treatment/Exercise - 01/08/16 1449    Neuro Re-ed    Neuro Re-ed Details  deep core level 3 , required cues for stability in level 2   10 x 2  each elvel    Exercises   Other Exercises  "w" in supine (yellow band), required modification w/ less deg of ER due to c/o L flank mm spasm  10x 3    Manual Therapy   Manual therapy comments light manual therapy; cranium, sacrum    Myofascial Release scar massage                      PT Long Term  Goals - 01/08/16 1451    PT LONG TERM GOAL #1   Title Pt will report a decrease on Belle from 60% to < 50% in order to return to ADLs.   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   Time 12   Period Weeks   Status On-going   PT LONG TERM GOAL #3   Title Pt will demo proper lifting mechanics with 50# from floor to counter 3 reps and activation of deep core without pain in order to lift grandchildren.   Time 12   Period Weeks   Status On-going   PT LONG TERM GOAL #4   Title Pt will demo decreased scar immobility along horizontal. longitudinal abdominal scars in order to progress to deep core strengthening.    Time 12   Period Weeks   Status Achieved   PT LONG TERM GOAL #5   Title Pt will demo ability  to perform full ROM in openbook exercise with trunk rotation R and L 10 reps without c/o pain in order to restore spinal movements for functional activities.    Time 12   Period Weeks   Status Achieved   PT LONG TERM GOAL #6   Title Pt will demo proper sitting posture without verbal cuing across 2 visits in order to minimize relapse of Sx.    Time 12   Period Weeks   Status Achieved   PT LONG TERM GOAL #7   Title Pt will demo Grade 3/10/7/7 in order to minimize leakage and improve QOL.   Time 12   Period Weeks   Status On-going               Plan - 01/08/16 1454    Clinical Impression Statement Pt has acheived 3/7 goals with no c/o radiating leg pain for 4 weeks. Pt's suprapubic pain has also subsided for 2 weeks as her abdominal scar restrictions have decreased with manual Tx. Pt shows incrased trunk rotation ROM without pain. Initiated resistance band strengthening of thoraclumbar fascia and posterior back mm to build the endurance and strength perform repeated lifting at work.      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 Left groin pain 12/23/2015  . Loss of weight 10/02/2015  . Myofascial pain 09/16/2015  . Spasm 09/16/2015  . Chronic female pelvic pain 09/16/2015  . Abdominal pain, left lower quadrant 09/07/2015  . Abnormal mammogram 04/12/2015  . Health care maintenance 04/12/2015  . Sore throat 10/05/2014  . Flank pain  08/21/2014  . Sacral pain 12/29/2013  . Sinus congestion 12/29/2013  . Rib pain 09/29/2013  . FOM (frequency of micturition) 05/15/2013  . Urge incontinence of urine 05/15/2013  . UTI (urinary tract infection) 02/27/2013  . Family history of colon cancer 02/20/2013  . Headache(784.0) 02/11/2013  . Chronic cystitis 02/11/2013  . History of ovarian cyst 02/11/2013  . Reflux, vesicoureteral 10/15/2012  . Symptoms involving urinary system 10/15/2012  . Mixed incontinence 10/15/2012  . Incomplete bladder emptying 10/15/2012  . Disorder of bladder function 10/15/2012    Jerl Mina ,PT, DPT, E-RYT  01/08/2016, 3:10 PM  [place] MAIN [facility] SERVICES [address] [place], [place], [address] Phone: [phone]   Fax:  [phone]  Name: ALLEX KASSON MRN: [id number] Date of Birth: January 13, 1966

## 2016-01-08 NOTE — Patient Instructions (Addendum)
"  W" with yellow band  10 x 3     Deep core Level 2  -30 reps knee out 20-30 deg  - 30 reps  Pelvis stay stable    Deep core Level 3- lift the foot  On exhale - 30 reps   Pelvic floor squeezes    Stretches    Remember not to push pelvic floor with urination  Drink water (16 fl oz bottle) / day  (then increase gradually )

## 2016-01-12 NOTE — Addendum Note (Signed)
Addended by: Jerl Mina on: 01/12/2016 04:10 PM   Modules accepted: Orders

## 2016-01-22 ENCOUNTER — Ambulatory Visit: Payer: BLUE CROSS/BLUE SHIELD | Attending: Student | Admitting: Physical Therapy

## 2016-01-22 DIAGNOSIS — R531 Weakness: Secondary | ICD-10-CM | POA: Diagnosis present

## 2016-01-22 DIAGNOSIS — R278 Other lack of coordination: Secondary | ICD-10-CM | POA: Diagnosis present

## 2016-01-22 DIAGNOSIS — Z7409 Other reduced mobility: Secondary | ICD-10-CM | POA: Diagnosis present

## 2016-01-22 NOTE — Patient Instructions (Addendum)
Performs stretches first:  Open book  Resting on yoga block behind midback and head 3-5 min , rocking knees slightly to R/ L to feel back mm release over edge of block  Forward bending by wall/counter stretch to R and L   Pect stretch by wall     STRENGTHENING ROUTINE  (Do not perform 10 sec hold pelvic floor ex anymore)  Perform sounding of "sssss" 10x 3 sets   Deep core level 3 (10x 3) lifting foot (Dont let your rib/spine/pelvis rock)  ____________________   "W" with band (elbows and shoulders stay on bed) only hands move away like you are rowing   10 x 2  ( perform back stretches in between sets)   "doorknob pull"  Stand knees unlocked perpendicular inhale, exhale feel your ribcage lower then pull band away from door  5-10 deg opp to doorway. Keep pelvis/ knee still.   ____________________

## 2016-01-23 NOTE — Therapy (Addendum)
[place] MAIN [facility] SERVICES [address] [place], [place], [address] Phone: [phone]   Fax:  [phone]  Physical Therapy Treatment  Patient Details  Name: Rose Bernard MRN: [id number] Date of Birth: 1966/08/16 No Data Recorded  Encounter Date: 01/22/2016      PT End of Session - 01/22/16 2356    Visit Number 6   Number of Visits 12   PT Start Time 1410   PT Stop Time 1510   PT Time Calculation (min) 60 min      Past Medical History  Diagnosis Date  . Frequent UTI   . Chronic cystitis   . Incomplete bladder emptying   . 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 Breast surgery Bilateral 2002    benign mass removed  . Bladder botox  2014, 2015, 2016  . Foot surgery Bilateral   . Breast biopsy Bilateral     neg  . Reconstruction of bladder   1995     2/2 delivery complications with 3rd child   . Appendectomy      2001 due to scar tissue  . Abdominal hysterectomy  1995    2/2 delivery complications with 3rd child. Hysterectomy and bladder repair with subsequent appendectomy. L Ovary remain in place   . Laparoscopic hysterectomy  1995  . Colonoscopy  2014    There were no vitals filed for this visit.  Visit Diagnosis:  Weakness generalized  Coordination impairment  Limited mobility      Subjective Assessment - 01/22/16 1420    Subjective Pt reports her suprapubic pain has been hurting "a little bit" but it has improved by 30%. Pt states her shoulders are hurting today and she thinks the shoulder issue could be from lifting parts at work. Pt states her urinary leakage was bad on Wednesday and "doing okay" today.     Pertinent History Every 9 months, pt received botox over bladder area in order to prevent closure of ureter and to decrease UTI. Prior to this treatment, pt was getting UTI  6-7x /year. Currently, pt does not have UTI infection according  to her visit to see Dr. Jacqlyn Larsen , her urologist  2 weeks ago.     Limitations Sitting;Standing;Walking;House hold activities;Other (comment);Lifting   How long can you sit comfortably? 20-30 min   How long can you stand comfortably? 5-10 mins    Patient Stated Goals get better    Currently in Pain? No/denies            [facility] PT Assessment - 01/22/16 2352    Observation/Other Assessments   Observations incorrect alignment w / previous execises which is likely associated w/ her complaint of shoulder pain. Pt ableto perform correct alignment wih tactile and verbal cues                   Pelvic Floor Special Questions - 01/22/16 2351    Pelvic Floor Internal Exam pt verbally consented without contraindications    Exam Type Vaginal   Strength fair squeeze, definite lift   Biofeedback pt felt more pelvic floor contraction w/ exhalation than cue for sustained holds.            [place] [facility] PT Treatment/Exercise - 01/22/16 2353    Neuro Re-ed    Neuro Re-ed Details  pelvic floor strengthening by way of breathing and utilizing deep core mm system as opposed to isolated pelvic  floor sustained holds due to pt's weakness and poor ability to sense closure of mm   alignment to HEP   Exercises   Other Exercises  see pt instructions                      PT Long Term Goals - 01/08/16 1451    PT LONG TERM GOAL #1   Title Pt will report a decrease on [place] from 60% to < 50% in order to return to ADLs.   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   Time 12   Period Weeks   Status On-going   PT LONG TERM GOAL #3   Title Pt will demo proper lifting mechanics with 50# from floor to counter 3 reps and activation of deep core without pain in order to lift  grandchildren.   Time 12   Period Weeks   Status On-going   PT LONG TERM GOAL #4   Title Pt will demo decreased scar immobility along horizontal. longitudinal abdominal scars in order to progress to deep core strengthening.    Time 12   Period Weeks   Status Achieved   PT LONG TERM GOAL #5   Title Pt will demo ability to perform full ROM in openbook exercise with trunk rotation R and L 10 reps without c/o pain in order to restore spinal movements for functional activities.    Time 12   Period Weeks   Status Achieved   PT LONG TERM GOAL #6   Title Pt will demo proper sitting posture without verbal cuing across 2 visits in order to minimize relapse of Sx.    Time 12   Period Weeks   Status Achieved   PT LONG TERM GOAL #7   Title Pt will demo Grade 3/10/7/7 in order to minimize leakage and improve QOL.   Time 12   Period Weeks   Status On-going               Plan - 01/22/16 2356    Clinical Impression Statement Pt is progressing towards her goals. Down graded pelvic floor strengthening exercises to diaphragmatic/ pelvic floor breathing coordination instead of sustained holds to faciliate improved awareness of closure of pelvic floor mm with all 3 layers. Initiated multifidis band exercises to promote posterior core mm. Caution heeded to  gradually introduce isometric/resisted strengthening  as pt 's line of work involves repeated overuse of posterior back mm with lifting. Reinforced a balance of flexibility HEP and strengthening exercises. Pt wil benefit form continued skilled PT. Anticipate pt will achieve her goals.    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 Left groin pain 12/23/2015  . Loss of weight 10/02/2015  . Myofascial pain 09/16/2015  . Spasm 09/16/2015  . Chronic female pelvic pain 09/16/2015  . Abdominal pain, left lower quadrant 09/07/2015  . Abnormal mammogram 04/12/2015  . Health care maintenance 04/12/2015  . Sore throat 10/05/2014  . Flank pain 08/21/2014  . Sacral pain 12/29/2013  . Sinus congestion 12/29/2013  . Rib pain 09/29/2013  . FOM (frequency of micturition) 05/15/2013  . Urge incontinence of urine 05/15/2013  . UTI (urinary tract infection) 02/27/2013  . Family history of colon cancer 02/20/2013  . Headache(784.0) 02/11/2013  . Chronic cystitis 02/11/2013  . History of ovarian cyst 02/11/2013  . Reflux, vesicoureteral 10/15/2012  . Symptoms involving urinary system 10/15/2012  . Mixed incontinence 10/15/2012  . Incomplete bladder emptying 10/15/2012  . Disorder of bladder function 10/15/2012    Jerl Mina ,PT, DPT, E-RYT  01/23/2016, 12:02 AM  [place] MAIN [facility] SERVICES [address] [place], [place], [address] Phone: [phone]    Fax:  [phone]  Name: CHASYA HEO MRN: [id number] Date of Birth: June 11, 1966

## 2016-01-27 ENCOUNTER — Encounter: Payer: BLUE CROSS/BLUE SHIELD | Admitting: Physical Therapy

## 2016-01-29 ENCOUNTER — Ambulatory Visit: Payer: BLUE CROSS/BLUE SHIELD | Admitting: Physical Therapy

## 2016-02-03 ENCOUNTER — Encounter: Payer: BLUE CROSS/BLUE SHIELD | Admitting: Physical Therapy

## 2016-02-04 ENCOUNTER — Ambulatory Visit: Payer: Self-pay | Admitting: Physical Therapy

## 2016-02-04 DIAGNOSIS — R531 Weakness: Secondary | ICD-10-CM

## 2016-02-04 DIAGNOSIS — R278 Other lack of coordination: Secondary | ICD-10-CM

## 2016-02-04 DIAGNOSIS — Z7409 Other reduced mobility: Secondary | ICD-10-CM

## 2016-02-04 NOTE — Therapy (Deleted)
[place] MAIN [facility] SERVICES [address] [place], [place], [address] Phone: [phone]   Fax:  [phone]  Physical Therapy Discharge Summary  Patient Details  Name: Rose Bernard MRN: [id number] Date of Birth: [birth year] No Data Recorded  Encounter Date: 02/04/2016    Past Medical History  Diagnosis Date  . Frequent UTI   . Chronic cystitis   . Incomplete bladder emptying   . 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 Breast surgery Bilateral 2002    benign mass removed  . Bladder botox  2014, 2015, 2016  . Foot surgery Bilateral   . Breast biopsy Bilateral     neg  . Reconstruction of bladder   1995     2/2 delivery complications with 3rd child   . Appendectomy      2001 due to scar tissue  . Abdominal hysterectomy  1995    2/2 delivery complications with 3rd child. Hysterectomy and bladder repair with subsequent appendectomy. L Ovary remain in place   . Laparoscopic hysterectomy  1995  . Colonoscopy  2014    There were no vitals filed for this visit.    Pt was called to f/u on cancelled appt. Pt explained she can not afford coming to more visits. Her remaining issue is related to back pain. Pt has not had any radiating pain down her leg for more than a month. At her 5th visit, pt reported her suprapubic pain has decreased by 80%.  Pt's urinary leakage has improved by 40%.  At her 6th visit, pt was progressing well towards strengthening exercises for her back and pelvic floor mm.   Pt is self-discharging. Pt met 5/7 goals with significantly decreased score on Pain Disability Index from 60% to 34% and showed significantly improved abdominal scar mobility and upright posture. Pt was progressing well towards her remaining goals.    Thank you for your referral!        PT Long Term Goals - 02/04/16 1549    PT LONG TERM GOAL #1   Title Pt will report a  decrease on [place] from 60% to < 50% in order to return to ADLs. 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   Time 12   Period Weeks   Status Achieved   PT LONG TERM GOAL #3   Title Pt will demo proper lifting mechanics with 50# from floor to counter 3 reps and activation of deep core without pain in order to lift grandchildren.   Time 12   Period Weeks   Status On-going   PT LONG TERM GOAL #4   Title Pt will demo decreased scar immobility along horizontal. longitudinal abdominal scars in order to progress to deep core strengthening.    Time 12   Period Weeks   Status Achieved   PT LONG TERM GOAL #5   Title Pt will demo ability to perform full ROM in openbook exercise with trunk rotation R and L 10 reps without c/o pain in order to restore spinal movements for functional activities.    Time 12   Period Weeks  Status Achieved   PT LONG TERM GOAL #6   Title Pt will demo proper sitting posture without verbal cuing across 2 visits in order to minimize relapse of Sx.    Time 12   Period Weeks   Status Achieved   PT LONG TERM GOAL #7   Title Pt will demo Grade 3/10/7/7 in order to minimize leakage and improve QOL.   Time 12   Period Weeks   Status On-going                Jerl Mina ,[place], DPT, E-RYT  02/04/2016, 3:59 PM  [place] MAIN [facility] SERVICES [address] [place], [place], [address] Phone: [phone]   Fax:  [phone]  Name: NISHKA HEIDE MRN: [id number] Date of Birth: 1966-06-26

## 2016-02-04 NOTE — Therapy (Deleted)
[place] MAIN [facility] SERVICES [address] [place], [place], [address] Phone: [phone]   Fax:  [phone]  Patient Details  Name: Rose Bernard MRN: [id number] Date of Birth: 07/18/1966 Referring Provider: Dr. Otho Perl   Encounter Date: 02/04/2016  *** Error: Pt did not arrive on this date. The option "Arrived with no charge" was selected as an error.  PT had intended to create a blank documentation, not "Outpatient Rehab" treatment note.    Jerl Mina 02/04/2016, 4:02 PM  [place] MAIN [facility] SERVICES [address] [place], [place], [address] Phone: [phone]   Fax:  [phone]   Physical Therapy Discharge Summary  Patient Details  Name: Rose Bernard MRN: [id number] Date of Birth: August 19, 1966 No Data Recorded  Encounter Date: 02/04/2016    Past Medical History  Diagnosis Date  . Frequent UTI   . Chronic cystitis   . Incomplete bladder emptying   . 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 Breast surgery Bilateral 2002    benign mass removed  . Bladder botox  2014, 2015, 2016  . Foot surgery Bilateral   . Breast biopsy Bilateral     neg  . Reconstruction of bladder   1995     2/2 delivery complications with 3rd child   . Appendectomy      2001 due to scar tissue  . Abdominal hysterectomy  1995    2/2 delivery complications with 3rd child. Hysterectomy and bladder repair with subsequent appendectomy. L Ovary remain in place   . Laparoscopic hysterectomy  1995  . Colonoscopy  2014     Pt was called to f/u on cancelled appts Pt explained she can not afford coming to more visits. Her remaining issue is related to back pain. Pt has not had any radiating pain down her leg for more than a month. At her 5th visit, pt reported her suprapubic pain has decreased by 80%.  Pt's urinary leakage has improved by 40%.  At  her 6th visit, pt was progressing well towards strengthening exercises for her back and pelvic floor mm.   Pt is self-discharging. Pt met 5/7 goals with significantly decreased score on Pain Disability Index from 60% to 34% and showed significantly improved abdominal scar mobility,  upright posture, and improved pelvic floor coordination and strengthening. Pt was progressing well towards her remaining goals.    Thank you for your referral!        PT Long Term Goals - 02/04/16 1549    PT LONG TERM GOAL #1   Title Pt will report a decrease on [place] from 60% to < 50% in order to return to ADLs. 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   Time 12   Period Weeks   Status Achieved   PT LONG TERM GOAL #3   Title Pt will demo proper lifting mechanics with 50# from floor to counter 3 reps and activation of deep core without pain in order to lift grandchildren.   Time 12   Period Weeks   Status On-going   PT LONG TERM GOAL #  4   Title Pt will demo decreased scar immobility along horizontal. longitudinal abdominal scars in order to progress to deep core strengthening.    Time 12   Period Weeks   Status Achieved   PT LONG TERM GOAL #5   Title Pt will demo ability to perform full ROM in openbook exercise with trunk rotation R and L 10 reps without c/o pain in order to restore spinal movements for functional activities.    Time 12   Period Weeks   Status Achieved   PT LONG TERM GOAL #6   Title Pt will demo proper sitting posture without verbal cuing across 2 visits in order to minimize relapse of Sx.    Time 12   Period Weeks   Status Achieved   PT LONG TERM GOAL #7   Title Pt will demo Grade 3/10/7/7 in order to minimize leakage  and improve QOL.   Time 12   Period Weeks   Status On-going         Jerl Mina ,[place], DPT, E-RYT  02/04/2016, 3:59 PM  [place] MAIN [facility] SERVICES [address] [place], [place], [address] Phone: [phone]   Fax:  [phone]  Name: LAURIEANNE GALLOWAY MRN: [id number] Date of Birth: Jan 03, 1966

## 2016-02-05 ENCOUNTER — Encounter: Payer: BLUE CROSS/BLUE SHIELD | Admitting: Physical Therapy

## 2016-02-05 NOTE — Therapy (Signed)
[place] MAIN [facility] SERVICES [address] [place], [place], [address] Phone: [phone]   Fax:  [phone]  Physical Therapy Discharge Summary  Patient Details  Name: Rose Bernard MRN: [id number] Date of Birth: 07-Sep-1966 No Data Recorded    Past Medical History  Diagnosis Date  . Frequent UTI   . Chronic cystitis   . Incomplete bladder emptying   . 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 Breast surgery Bilateral 2002    benign mass removed  . Bladder botox  2014, 2015, 2016  . Foot surgery Bilateral   . Breast biopsy Bilateral     neg  . Reconstruction of bladder   1995     2/2 delivery complications with 3rd child   . Appendectomy      2001 due to scar tissue  . Abdominal hysterectomy  1995    2/2 delivery complications with 3rd child. Hysterectomy and bladder repair with subsequent appendectomy. L Ovary remain in place   . Laparoscopic hysterectomy  1995  . Colonoscopy  2014    There were no vitals filed for this visit.  Visit Diagnosis:  Weakness generalized  Coordination impairment  Limited mobility      Pt was called to f/u on cancelled appts Pt explained she can not afford coming to more visits. Her remaining issue is related to back pain. Pt has not had any radiating pain down her leg for more than a month. At her 5th visit, pt reported her suprapubic pain has decreased by 80%.  Pt's urinary leakage has improved by 40%.  At her 6th visit, pt was progressing well towards strengthening exercises for her back and pelvic floor mm.   Pt is self-discharging. Pt met 5/7 goals with significantly decreased score on Pain Disability Index from 60% to 34% and showed significantly improved abdominal scar mobility,  upright posture, and improved pelvic floor coordination and strengthening. Pt was progressing well towards her remaining goals.    Thank you for  your referral!        PT Long Term Goals - 02/04/16 1549    PT LONG TERM GOAL #1   Title Pt will report a decrease on [place] from 60% to < 50% in order to return to ADLs. 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   Time 12   Period Weeks   Status Achieved   PT LONG TERM GOAL #3   Title Pt will demo proper lifting mechanics with 50# from floor to counter 3 reps and activation of deep core without pain in order to lift grandchildren.   Time 12   Period Weeks   Status On-going   PT LONG TERM GOAL #4   Title Pt will demo decreased scar immobility along horizontal. longitudinal abdominal scars in order to progress to deep core strengthening.    Time 12   Period Weeks   Status Achieved   PT LONG TERM GOAL #5   Title Pt will demo ability to perform full ROM in openbook exercise with trunk rotation R and L 10 reps without c/o pain in order to  restore spinal movements for functional activities.    Time 12   Period Weeks   Status Achieved   PT LONG TERM GOAL #6   Title Pt will demo proper sitting posture without verbal cuing across 2 visits in order to minimize relapse of Sx.    Time 12   Period Weeks   Status Achieved   PT LONG TERM GOAL #7   Title Pt will demo Grade 3/10/7/7 in order to minimize leakage and improve QOL.   Time 12   Period Weeks   Status Not met          Jerl Mina ,PT, DPT, E-RYT  02/04/2016, 3:59 PM     [place] MAIN [facility] SERVICES [address] [place], [place], [address] Phone: [phone]   Fax:  [phone]  Name: EMMALIE HAIGH MRN: [id number] Date of Birth: 08/30/1966

## 2016-02-08 ENCOUNTER — Encounter: Payer: Self-pay | Admitting: Internal Medicine

## 2016-02-08 ENCOUNTER — Ambulatory Visit (INDEPENDENT_AMBULATORY_CARE_PROVIDER_SITE_OTHER): Payer: BLUE CROSS/BLUE SHIELD | Admitting: Internal Medicine

## 2016-02-08 VITALS — BP 90/60 | HR 66 | Temp 97.8°F | Resp 18 | Ht 65.0 in | Wt 120.1 lb

## 2016-02-08 DIAGNOSIS — R102 Pelvic and perineal pain unspecified side: Secondary | ICD-10-CM

## 2016-02-08 DIAGNOSIS — R079 Chest pain, unspecified: Secondary | ICD-10-CM | POA: Diagnosis not present

## 2016-02-08 DIAGNOSIS — N3020 Other chronic cystitis without hematuria: Secondary | ICD-10-CM | POA: Diagnosis not present

## 2016-02-08 DIAGNOSIS — R634 Abnormal weight loss: Secondary | ICD-10-CM

## 2016-02-08 DIAGNOSIS — R0789 Other chest pain: Secondary | ICD-10-CM

## 2016-02-08 DIAGNOSIS — R232 Flushing: Secondary | ICD-10-CM

## 2016-02-08 DIAGNOSIS — R300 Dysuria: Secondary | ICD-10-CM | POA: Diagnosis not present

## 2016-02-08 DIAGNOSIS — N949 Unspecified condition associated with female genital organs and menstrual cycle: Secondary | ICD-10-CM

## 2016-02-08 DIAGNOSIS — N951 Menopausal and female climacteric states: Secondary | ICD-10-CM

## 2016-02-08 DIAGNOSIS — G8929 Other chronic pain: Secondary | ICD-10-CM

## 2016-02-08 DIAGNOSIS — R339 Retention of urine, unspecified: Secondary | ICD-10-CM

## 2016-02-08 MED ORDER — VENLAFAXINE HCL ER 37.5 MG PO CP24: 37.5000 mg | ORAL_CAPSULE | Freq: Every day | ORAL | Status: DC

## 2016-02-08 NOTE — Progress Notes (Addendum)
Patient ID: Rose Bernard, female   DOB: 1966-02-15, 50 y.o.   MRN: [id number]   Subjective:    Patient ID: Rose Bernard, female    DOB: 01/05/1966, 50 y.o.   MRN: [id number]  HPI  Patient with past history of chronic cystitis, frequent UTIs with incomplete bladder emptying.  She has recently been undergoing physical therapy for her bladder and surrounding muscles.  See notes.  Pain improved.  Discussed the need to continue therapy at home.  Increased stress with family issues.  She feels she is handling things relatively well.  Desires no further intervention.  She does report noticing some chest pressure.  Had an episode a few weeks ago.  Described chest pressure and some increased heart rate.  Occasionally will notice increased heart rate.  Another episode of chest pressure recently.  Not as severe.  No definite symptoms with increased activity or exertion.  Breathing stable.  Eating.  Weight stable.  Hot flashes.  Worsening.  Occurring day and night.  Feels needs something for hot flashes.  Desires not to take estrogen.     Past Medical History  Diagnosis Date  . Frequent UTI   . Chronic cystitis   . Incomplete bladder emptying   . 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 Breast surgery Bilateral 2002    benign mass removed  . Bladder botox  2014, 2015, 2016  . Foot surgery Bilateral   . Breast biopsy Bilateral     neg  . Reconstruction of bladder   1995     2/2 delivery complications with 3rd child   . Appendectomy      2001 due to scar tissue  . Abdominal hysterectomy  1995    2/2 delivery complications with 3rd child. Hysterectomy and bladder repair with subsequent appendectomy. L Ovary remain in place   . Laparoscopic hysterectomy  1995  . Colonoscopy  2014   Family History  Problem Relation Age of Onset  . Colon cancer Father     With ostomy in place   . Breast cancer Paternal Aunt 11   Social History    Social History  . Marital Status: Married    Spouse Name: N/A  . Number of Children: 2  . Years of Education: N/A   Social History Main Topics  . Smoking status: Never Smoker   . Smokeless tobacco: Never Used  . Alcohol Use: No  . Drug Use: No  . Sexual Activity: Yes    Birth Control/ Protection: Surgical   Other Topics Concern  . None   Social History Narrative    Outpatient Encounter Prescriptions as of 02/08/2016  Medication Sig  . nitrofurantoin (MACRODANTIN) 100 MG capsule Take 100 mg by mouth as needed. Reported on 02/08/2016  . venlafaxine XR (EFFEXOR XR) 37.5 MG 24 hr capsule Take 1 capsule (37.5 mg total) by mouth daily with breakfast.   No facility-administered encounter medications on file as of 02/08/2016.    Review of Systems  Constitutional:       Weight stable.  Eating regular meals.    HENT: Negative for congestion and sinus pressure.   Respiratory: Negative for cough, chest tightness and shortness of breath.   Cardiovascular: Negative for chest pain, palpitations and leg swelling.  Gastrointestinal: Negative for nausea and vomiting.       Lower abdominal pain and pelvic pain better.    Endocrine:       Hot  flashes as outlined.   Genitourinary: Positive for dysuria (some occasional dysuria.  request urine check to confirm no infection. ). Negative for vaginal discharge.  Musculoskeletal: Negative for myalgias, back pain and joint swelling.  Skin: Negative for color change and rash.  Neurological: Positive for dizziness and light-headedness. Negative for headaches.  Psychiatric/Behavioral: Negative for agitation.       Increased stress as outlined.         Objective:    Physical Exam  Constitutional: She appears well-developed and well-nourished. No distress.  HENT:  Nose: Nose normal.  Mouth/Throat: Oropharynx is clear and moist.  Eyes: Conjunctivae are normal. Right eye exhibits no discharge. Left eye exhibits no discharge.  Neck: Neck supple.  No thyromegaly present.  Cardiovascular: Normal rate and regular rhythm.   Pulmonary/Chest: Breath sounds normal. No respiratory distress. She has no wheezes.  Abdominal: Soft. Bowel sounds are normal.  Very minimal tenderness to palpation lower pelvic region.  Improved.     Musculoskeletal: She exhibits no edema or tenderness.  Lymphadenopathy:    She has no cervical adenopathy.  Skin: No rash noted. No erythema.  Psychiatric: She has a normal mood and affect. Her behavior is normal.    BP 90/60 mmHg  Pulse 66  Temp(Src) 97.8 F (36.6 C) (Oral)  Resp 18  Ht 5\' 5"  (1.651 m)  Wt 120 lb 2 oz (54.488 kg)  BMI 19.99 kg/m2  SpO2 97%  LMP 02/07/1994 Wt Readings from Last 3 Encounters:  02/08/16 120 lb 2 oz (54.488 kg)  12/23/15 120 lb (54.432 kg)  12/02/15 119 lb 8 oz (54.205 kg)     Lab Results  Component Value Date   WBC 5.3 10/01/2015   HGB 14.0 10/01/2015   HCT 42.1 10/01/2015   PLT 238.0 10/01/2015   GLUCOSE 82 10/01/2015   CHOL 180 10/16/2014   TRIG 46.0 10/16/2014   HDL 55.80 10/16/2014   LDLCALC 115* 10/16/2014   ALT 14 10/01/2015   AST 14 10/01/2015   NA 138 10/01/2015   K 4.4 10/01/2015   CL 102 10/01/2015   CREATININE 0.79 10/01/2015   BUN 15 10/01/2015   CO2 28 10/01/2015   TSH 2.03 10/01/2015   HGBA1C 5.6 10/01/2015    Ct Abdomen Pelvis W Contrast  09/29/2015  CLINICAL DATA:  Painful palpable mass anterior abdomen. EXAM: CT ABDOMEN AND PELVIS WITH CONTRAST TECHNIQUE: Multidetector CT imaging of the abdomen and pelvis was performed using the standard protocol following bolus administration of intravenous contrast. CONTRAST:  6mL OMNIPAQUE IOHEXOL 350 MG/ML SOLN COMPARISON:  Ultrasound today.  CT abdomen pelvis 07/14/2006 FINDINGS: Lower chest:  Lung bases clear Hepatobiliary: Small hepatic cysts. No liver mass. Gallbladder and bile ducts normal. Pancreas: Negative Spleen: Negative Adrenals/Urinary Tract: Parapelvic cyst on the left. No renal obstruction or  mass. 3 mm nonobstructing calculus right mid pole. Sub cm left renal cyst. Stomach/Bowel: Negative for bowel obstruction. No bowel edema. Prior appendectomy. Vascular/Lymphatic:  Negative Reproductive: Left adnexal cyst measures 22 mm. This is significantly smaller when than the prior CT when a large complex cyst was present in the left adnexa. Hysterectomy changes. Other: Palpable abnormality corresponds to subcutaneous nodule in the anterior abdominal wall mid pelvis. This measures 12 mm. Based on ultrasound this appears either solid or complex cystic. This could represent infection or neoplasm. If this does not resolve with antibiotics, biopsy is recommended. No intra-abdominal adenopathy is identified. Musculoskeletal: Mild lumbar disc degeneration. No fracture or acute bone lesion. IMPRESSION: Palpable abnormality corresponds  to a 12 mm subcutaneous nodule in the anterior abdominal wall mid pelvis. This appears solid based on ultrasound. This could represent infection or metastatic disease. Biopsy recommended if this is not respond to antibiotics. Nonobstructing right renal calculus. 2 cm left adnexal cyst has improved significantly in size since prior CT. Electronically Signed   By: Franchot Gallo M.D.   On: 09/29/2015 13:20   US Abdomen Limited  09/29/2015  CLINICAL DATA:  Tender lump on abdominal wall. EXAM: LIMITED ABDOMINAL ULTRASOUND COMPARISON:  CT scan of July 14, 2006. FINDINGS: Complex but predominantly solid mass is noted in the anterior abdominal wall just inferior to the umbilicus which measures 1.3 x 1.2 x 0.9 cm. Doppler demonstrates internal blood flow. IMPRESSION: 1.3 cm complex but predominantly solid mass is noted in the anterior abdominal wall inferior to the umbilicus in area of palpable concern. It does demonstrate some internal flow on Doppler. This is concerning for possible neoplasm and tissue sampling is recommended. Electronically Signed   By: Marijo Conception, M.D.   On:  09/29/2015 09:46       Assessment & Plan:   Problem List Items Addressed This Visit    Chest tightness    Has had a couple of episodes as outlined.  Unclear etiology.  Some palpitations.  Treat menopausal symptoms.  EKG revealed SR with no acute ischemic changes.  Refer to cardiology for further evaluation with question of need for further cardiac w/up.        Chronic cystitis    Followed by Dr Jacqlyn Larsen.  Has f/u in March.  Some dysuria.  Request to have urine checked to confirm no infection.        Chronic female pelvic pain    Went to therapy.  Pain is better.  Continue exercises at home.        Relevant Medications   venlafaxine XR (EFFEXOR XR) 37.5 MG 24 hr capsule   Hot flashes    Discussed at length.  Desires not to take estrogen.  Start effexor 37.5mg  q day.  Follow.  Get her back in soon to reassess.        Incomplete bladder emptying    Followed by Dr Jacqlyn Larsen.       Loss of weight    States eating well.  Weight is stable from previous check.  Follow.         Other Visit Diagnoses    Chest pain, unspecified chest pain type    -  Primary    Relevant Orders    EKG 12-Lead (Completed)    Ambulatory referral to Cardiology    Dysuria        Relevant Orders    Urinalysis, Routine w reflex microscopic (not at [facility])    CULTURE, URINE COMPREHENSIVE      I spent 40 minutes with the patient and more than 50% of the time was spent in consultation regarding the above.     Einar Pheasant, MD

## 2016-02-08 NOTE — Progress Notes (Signed)
Pre-visit discussion using our clinic review tool. No additional management support is needed unless otherwise documented below in the visit note.  

## 2016-02-09 ENCOUNTER — Encounter: Payer: Self-pay | Admitting: Internal Medicine

## 2016-02-09 DIAGNOSIS — R232 Flushing: Secondary | ICD-10-CM | POA: Insufficient documentation

## 2016-02-09 DIAGNOSIS — R0789 Other chest pain: Secondary | ICD-10-CM | POA: Insufficient documentation

## 2016-02-09 LAB — URINALYSIS, ROUTINE W REFLEX MICROSCOPIC
BILIRUBIN URINE: NEGATIVE
Hgb urine dipstick: NEGATIVE
Ketones, ur: NEGATIVE
Leukocytes, UA: NEGATIVE
NITRITE: NEGATIVE
PH: 6 (ref 5.0–8.0)
RBC / HPF: NONE SEEN (ref 0–?)
Specific Gravity, Urine: 1.02 (ref 1.000–1.030)
TOTAL PROTEIN, URINE-UPE24: NEGATIVE
URINE GLUCOSE: NEGATIVE
UROBILINOGEN UA: 0.2 (ref 0.0–1.0)

## 2016-02-09 NOTE — Addendum Note (Signed)
Addended by: Alisa Graff on: 02/09/2016 08:10 AM   Modules accepted: Orders

## 2016-02-09 NOTE — Assessment & Plan Note (Signed)
Followed by Dr Cope.  

## 2016-02-09 NOTE — Assessment & Plan Note (Signed)
States eating well.  Weight is stable from previous check.  Follow.

## 2016-02-09 NOTE — Assessment & Plan Note (Signed)
Has had a couple of episodes as outlined.  Unclear etiology.  Some palpitations.  Treat menopausal symptoms.  EKG revealed SR with no acute ischemic changes.  Refer to cardiology for further evaluation with question of need for further cardiac w/up.

## 2016-02-09 NOTE — Assessment & Plan Note (Signed)
Went to therapy.  Pain is better.  Continue exercises at home.

## 2016-02-09 NOTE — Assessment & Plan Note (Signed)
Followed by Dr Jacqlyn Larsen.  Has f/u in March.  Some dysuria.  Request to have urine checked to confirm no infection.

## 2016-02-09 NOTE — Assessment & Plan Note (Signed)
Discussed at length.  Desires not to take estrogen.  Start effexor 37.5mg  q day.  Follow.  Get her back in soon to reassess.

## 2016-02-12 LAB — CULTURE, URINE COMPREHENSIVE: Colony Count: 40000

## 2016-02-26 ENCOUNTER — Encounter: Payer: Self-pay | Admitting: Emergency Medicine

## 2016-02-26 ENCOUNTER — Ambulatory Visit
Admission: EM | Admit: 2016-02-26 | Discharge: 2016-02-26 | Disposition: A | Discharge status: Home / Self Care | Payer: BLUE CROSS/BLUE SHIELD | Attending: Family Medicine | Admitting: Family Medicine

## 2016-02-26 ENCOUNTER — Ambulatory Visit: Payer: BLUE CROSS/BLUE SHIELD | Admitting: Family Medicine

## 2016-02-26 DIAGNOSIS — J0100 Acute maxillary sinusitis, unspecified: Secondary | ICD-10-CM

## 2016-02-26 LAB — RAPID STREP SCREEN (MED CTR MEBANE ONLY): STREPTOCOCCUS, GROUP A SCREEN (DIRECT): NEGATIVE

## 2016-02-26 LAB — RAPID INFLUENZA A&B ANTIGENS (ARMC ONLY)
INFLUENZA A (ARMC): NEGATIVE
INFLUENZA B (ARMC): NEGATIVE

## 2016-02-26 MED ORDER — BENZONATATE 100 MG PO CAPS: 100.0000 mg | ORAL_CAPSULE | Freq: Three times a day (TID) | ORAL | Status: DC

## 2016-02-26 MED ORDER — AZITHROMYCIN 250 MG PO TABS: ORAL_TABLET | ORAL | Status: DC

## 2016-02-26 NOTE — ED Notes (Signed)
Patient c/o sore throat, cough, bilateral ear pain, HAs, and sinus pain since Wed.  Patient reports fevers.

## 2016-02-26 NOTE — ED Provider Notes (Signed)
CSN: [id number]     Arrival date & time 02/26/16  0912 History   First MD Initiated Contact with Patient 02/26/16 1037     Chief Complaint  Patient presents with  . Sore Throat  . Otalgia  . Facial Pain  . Fever   (Consider location/radiation/quality/duration/timing/severity/associated sxs/prior Treatment) Patient is a 50 y.o. female presenting with URI. The history is provided by the patient.  URI Presenting symptoms: congestion, cough, facial pain, fatigue, fever and sore throat   Severity:  Moderate Onset quality:  Sudden Duration:  1 week Timing:  Constant Progression:  Worsening Chronicity:  New Relieved by:  Nothing Ineffective treatments:  OTC medications Associated symptoms: headaches and sinus pain   Associated symptoms: no arthralgias, no myalgias, no neck pain, no sneezing, no swollen glands and no wheezing   Risk factors: sick contacts   Risk factors: not elderly, no chronic cardiac disease, no chronic kidney disease, no chronic respiratory disease, no diabetes mellitus, no immunosuppression, no recent illness and no recent travel     Past Medical History  Diagnosis Date  . Frequent UTI   . Chronic cystitis   . Incomplete bladder emptying   . 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 Breast surgery Bilateral 2002    benign mass removed  . Bladder botox  2014, 2015, 2016  . Foot surgery Bilateral   . Breast biopsy Bilateral     neg  . Reconstruction of bladder   1995     2/2 delivery complications with 3rd child   . Appendectomy      2001 due to scar tissue  . Abdominal hysterectomy  1995    2/2 delivery complications with 3rd child. Hysterectomy and bladder repair with subsequent appendectomy. L Ovary remain in place   . Laparoscopic hysterectomy  1995  . Colonoscopy  2014   Family History  Problem Relation Age of Onset  . Colon cancer Father     With ostomy in place   . Breast cancer  Paternal Aunt 27   Social History  Substance Use Topics  . Smoking status: Never Smoker   . Smokeless tobacco: Never Used  . Alcohol Use: No   OB History    Gravida Para Term Preterm AB TAB SAB Ectopic Multiple Living   3 2        2       Obstetric Comments   One still born 1st Menstrual Cycle:  14  1st Pregnancy:  24      Review of Systems  Constitutional: Positive for fever and fatigue.  HENT: Positive for congestion and sore throat. Negative for sneezing.   Respiratory: Positive for cough. Negative for wheezing.   Musculoskeletal: Negative for myalgias, arthralgias and neck pain.  Neurological: Positive for headaches.    Allergies  Penicillin v potassium  Home Medications   Prior to Admission medications   Medication Sig Start Date End Date Taking? Authorizing Provider  azithromycin (ZITHROMAX Z-PAK) 250 MG tablet 2 tabs po once day 1, then 1 tab po qd for next 4 days 02/26/16   Norval Gable, MD  benzonatate (TESSALON) 100 MG capsule Take 1 capsule (100 mg total) by mouth every 8 (eight) hours. 02/26/16   Norval Gable, MD  nitrofurantoin (MACRODANTIN) 100 MG capsule Take 100 mg by mouth as needed. Reported on 02/08/2016    Historical Provider, MD  venlafaxine XR (EFFEXOR XR) 37.5 MG 24 hr capsule Take 1 capsule (37.5  mg total) by mouth daily with breakfast. 02/08/16   Einar Pheasant, MD   Meds Ordered and Administered this Visit  Medications - No data to display  BP 118/63 mmHg  Pulse 65  Temp(Src) 98.1 F (36.7 C) (Tympanic)  Resp 16  Ht 5\' 6"  (1.676 m)  Wt 115 lb (52.164 kg)  BMI 18.57 kg/m2  SpO2 100%  LMP 02/07/1994 No data found.   Physical Exam  Constitutional: She appears well-developed and well-nourished. No distress.  HENT:  Head: Normocephalic and atraumatic.  Right Ear: Tympanic membrane, external ear and ear canal normal.  Left Ear: Tympanic membrane, external ear and ear canal normal.  Nose: Mucosal edema and rhinorrhea present. No nose  lacerations, sinus tenderness, nasal deformity, septal deviation or nasal septal hematoma. No epistaxis.  No foreign bodies. Right sinus exhibits maxillary sinus tenderness and frontal sinus tenderness. Left sinus exhibits maxillary sinus tenderness and frontal sinus tenderness.  Mouth/Throat: Uvula is midline, oropharynx is clear and moist and mucous membranes are normal. No oropharyngeal exudate.  Eyes: Conjunctivae and EOM are normal. Pupils are equal, round, and reactive to light. Right eye exhibits no discharge. Left eye exhibits no discharge. No scleral icterus.  Neck: Normal range of motion. Neck supple. No thyromegaly present.  Cardiovascular: Normal rate, regular rhythm and normal heart sounds.   Pulmonary/Chest: Effort normal and breath sounds normal. No respiratory distress. She has no wheezes. She has no rales.  Lymphadenopathy:    She has no cervical adenopathy.  Skin: She is not diaphoretic.  Nursing note and vitals reviewed.   ED Course  Procedures (including critical care time)  Labs Review Labs Reviewed  RAPID STREP SCREEN (NOT AT [facility])  RAPID INFLUENZA A&B ANTIGENS ([facility] ONLY)  CULTURE, GROUP A STREP [facility])    Imaging Review No results found.   Visual Acuity Review  Right Eye Distance:   Left Eye Distance:   Bilateral Distance:    Right Eye Near:   Left Eye Near:    Bilateral Near:         MDM   1. Acute maxillary sinusitis, recurrence not specified    Discharge Medication List as of 02/26/2016 10:58 AM    START taking these medications   Details  azithromycin (ZITHROMAX Z-PAK) 250 MG tablet 2 tabs po once day 1, then 1 tab po qd for next 4 days, Normal    benzonatate (TESSALON) 100 MG capsule Take 1 capsule (100 mg total) by mouth every 8 (eight) hours., Starting 02/26/2016, Until Discontinued, Normal       1. diagnosis reviewed with patient 2. rx as per orders above; reviewed possible side effects, interactions, risks and benefits   3.  Recommend supportive treatment with otc analgesics prn  4. Follow-up prn if symptoms worsen or don't improve    Norval Gable, MD 02/26/16 1301

## 2016-02-28 LAB — CULTURE, GROUP A STREP (THRC)

## 2016-03-04 ENCOUNTER — Telehealth: Payer: Self-pay | Admitting: Internal Medicine

## 2016-03-04 NOTE — Telephone Encounter (Signed)
Pt called requesting to be seen for sinus pressure and cough. Pt needs a 4pm appt. Please advise pt.msn

## 2016-03-04 NOTE — Telephone Encounter (Signed)
Pt is heading to [facility] acute care now. She is now having some chest pain & congestion.

## 2016-03-04 NOTE — Telephone Encounter (Signed)
She has been seen and treated.  Has h/o reoccurring issues in the past.  We can refer her to ENT if desires.  May save two trips.  I can work her in next week, but I do not have a 4:00 appt.  I can see her on 03/10/16 at 11:15.    Dr Nicki Reaper

## 2016-03-04 NOTE — Telephone Encounter (Signed)
Pt was seen at [facility] on 3/17 for same issues.

## 2016-03-04 NOTE — Telephone Encounter (Signed)
Pt stated that she can not wait until Thrusday. Pt was advised about Saturday Clinic. Pt stated that she has to work. Please advise.msn

## 2016-03-04 NOTE — Telephone Encounter (Signed)
I am out of the office on two afternoons and do not have openings late pm.  Thursday would be my first available this week.  If she wants me to refer to ENT, I can refer.  [place] [facility] is open until 7:00.  Let me know if problems.

## 2016-03-14 ENCOUNTER — Encounter: Payer: Self-pay | Admitting: General Surgery

## 2016-03-18 ENCOUNTER — Ambulatory Visit (INDEPENDENT_AMBULATORY_CARE_PROVIDER_SITE_OTHER): Payer: BLUE CROSS/BLUE SHIELD | Admitting: Family Medicine

## 2016-03-18 ENCOUNTER — Encounter: Payer: Self-pay | Admitting: Family Medicine

## 2016-03-18 VITALS — BP 108/62 | HR 64 | Temp 98.9°F | Ht 65.0 in | Wt 117.4 lb

## 2016-03-18 DIAGNOSIS — R3915 Urgency of urination: Secondary | ICD-10-CM

## 2016-03-18 DIAGNOSIS — J0100 Acute maxillary sinusitis, unspecified: Secondary | ICD-10-CM | POA: Diagnosis not present

## 2016-03-18 LAB — POCT URINALYSIS DIPSTICK
Bilirubin, UA: NEGATIVE
Blood, UA: NEGATIVE
Glucose, UA: NEGATIVE
KETONES UA: NEGATIVE
Leukocytes, UA: NEGATIVE
Nitrite, UA: NEGATIVE
PH UA: 6
PROTEIN UA: NEGATIVE
Urobilinogen, UA: 0.2

## 2016-03-18 MED ORDER — ALBUTEROL SULFATE HFA 108 (90 BASE) MCG/ACT IN AERS: 2.0000 | INHALATION_SPRAY | Freq: Four times a day (QID) | RESPIRATORY_TRACT | Status: DC | PRN

## 2016-03-18 MED ORDER — LEVOFLOXACIN 750 MG PO TABS: 750.0000 mg | ORAL_TABLET | Freq: Every day | ORAL | Status: DC

## 2016-03-18 NOTE — Patient Instructions (Signed)
Nice to meet you. You likely have a bacterial sinus infection that has not responded to the previously tried antibiotics. We will try him on Levaquin. Please monitor lower stomach discomfort. If this worsens please seek medical attention. If you develop chest pain, shortness of breath, cough productive of blood, fevers, or any new or changing symptoms please seek medical attention.

## 2016-03-18 NOTE — Progress Notes (Signed)
Pre visit review using our clinic review tool, if applicable. No additional management support is needed unless otherwise documented below in the visit note. 

## 2016-03-19 LAB — URINE CULTURE: Colony Count: 70000

## 2016-03-21 ENCOUNTER — Telehealth: Payer: Self-pay | Admitting: Internal Medicine

## 2016-03-21 DIAGNOSIS — J0100 Acute maxillary sinusitis, unspecified: Secondary | ICD-10-CM | POA: Insufficient documentation

## 2016-03-21 NOTE — Progress Notes (Signed)
Patient ID: Rose Bernard, female   DOB: [birth year], 50 y.o.   MRN: [id number]  [name], MD Phone: [phone]  Rose Bernard is a 50 y.o. female who presents today for same-day visit.  Patient notes persistent sinus pressure and ear fullness accompanied by chills. She has been evaluated multiple times for this in the last several weeks and has been treated with a Z-Pak which did not help and doxycycline which did not help. She notes continuing cough and some mild wheezing with no shortness of breath. She did have some nausea and vomiting earlier in the week. She feels as though she has some back pain in the area of her kidneys though is unsure if this is just related to her cough. She notes no dysuria though does have some urgency and frequency which are somewhat chronic. No vaginal discharge. She does note intermittent suprapubic discomfort though this mostly occurs with coughing. No saddle anesthesia. No loss of bowel or bladder function.  PMH: nonsmoker.   ROS see history of present illness  Objective  Physical Exam Filed Vitals:   03/18/16 1610  BP: 108/62  Pulse: 64  Temp: 98.9 F (37.2 C)    BP Readings from Last 3 Encounters:  03/18/16 108/62  02/26/16 118/63  02/08/16 90/60   Wt Readings from Last 3 Encounters:  03/18/16 117 lb 6 oz (53.241 kg)  02/26/16 115 lb (52.164 kg)  02/08/16 120 lb 2 oz (54.488 kg)    Physical Exam  Constitutional: She is well-developed, well-nourished, and in no distress.  HENT:  Head: Normocephalic and atraumatic.  Right Ear: External ear normal.  Left Ear: External ear normal.  Mouth/Throat: Oropharynx is clear and moist. No oropharyngeal exudate.  Mild tenderness to percussion of sinuses, normal TMs bilaterally  Eyes: Conjunctivae are normal. Pupils are equal, round, and reactive to light.  Neck: Neck supple.  Cardiovascular: Normal rate, regular rhythm and normal heart sounds.  Exam reveals no gallop and no friction  rub.   No murmur heard. Pulmonary/Chest: Effort normal and breath sounds normal. No respiratory distress. She has no wheezes. She has no rales.  Abdominal: Soft. Bowel sounds are normal. She exhibits no distension. There is no tenderness. There is no rebound and no guarding.  Musculoskeletal: She exhibits no edema.  No midline spine tenderness, no midline spine step-off, there is bilateral lumbar muscular back tenderness with no swelling  Lymphadenopathy:    She has no cervical adenopathy.  Neurological: She is alert. Gait normal.  5 out of 5 strength bilateral quads, hamstrings, plantar flexion, and dorsiflexion, sensation to light touch intact bilaterally lower extremities  Skin: Skin is warm and dry. She is not diaphoretic.     Assessment/Plan: Please see individual problem list.  Sinusitis, acute maxillary Patient's symptoms most consistent with undertreated bacterial sinusitis given persistence. I discussed treatment options given that she has a penicillin allergy and the fact that she has already been on a Z-Pak and doxycycline which leaves [place] with Levaquin as the next option. Discussed potential side effects of tendon rupture and neuropathy with Levaquin. We will additionally provide her with an albuterol inhaler given her report of wheezing though she has benign lung exam today. Suspect that her intermittent discomfort in her back with coughing is likely related to coughing a muscular strain. We've additionally checked her urine though this did not appear infected, though with her history of UTIs we'll send it for culture. If it does grow infection it should be likely covered  by Levaquin. Vital signs are stable. She will continue to monitor. She is given return precautions.    Orders Placed This Encounter  Procedures  . Urine Culture  . POCT Urinalysis Dipstick    Meds ordered this encounter  Medications  . levofloxacin (LEVAQUIN) 750 MG tablet    Sig: Take 1 tablet (750 mg  total) by mouth daily.    Dispense:  5 tablet    Refill:  0  . albuterol (PROVENTIL HFA;VENTOLIN HFA) 108 (90 Base) MCG/ACT inhaler    Sig: Inhale 2 puffs into the lungs every 6 (six) hours as needed for wheezing or shortness of breath.    Dispense:  1 Inhaler    Refill:  [place], MD [place]

## 2016-03-21 NOTE — Telephone Encounter (Signed)
LM for patient to return call.

## 2016-03-21 NOTE — Assessment & Plan Note (Addendum)
Patient's symptoms most consistent with undertreated bacterial sinusitis given persistence. I discussed treatment options given that she has a penicillin allergy and the fact that she has already been on a Z-Pak and doxycycline which leaves [place] with Levaquin as the next option. Discussed potential side effects of tendon rupture and neuropathy with Levaquin. We will additionally provide her with an albuterol inhaler given her report of wheezing though she has benign lung exam today. Suspect that her intermittent discomfort in her back with coughing is likely related to coughing a muscular strain. We've additionally checked her urine though this did not appear infected, though with her history of UTIs we'll send it for culture. If it does grow infection it should be likely covered by Levaquin. Vital signs are stable. She will continue to monitor. She is given return precautions.

## 2016-03-21 NOTE — Telephone Encounter (Signed)
Notified patient of lab results 

## 2016-03-21 NOTE — Telephone Encounter (Signed)
Pt called returning your call regarding lab results. Call pt @ [phone]. Thank you!

## 2016-04-26 ENCOUNTER — Encounter: Payer: Self-pay | Admitting: Internal Medicine

## 2016-04-26 ENCOUNTER — Ambulatory Visit (INDEPENDENT_AMBULATORY_CARE_PROVIDER_SITE_OTHER): Payer: BLUE CROSS/BLUE SHIELD | Admitting: Internal Medicine

## 2016-04-26 VITALS — BP 100/60 | HR 67 | Temp 98.0°F | Resp 18 | Ht 65.0 in | Wt 119.5 lb

## 2016-04-26 DIAGNOSIS — R928 Other abnormal and inconclusive findings on diagnostic imaging of breast: Secondary | ICD-10-CM | POA: Diagnosis not present

## 2016-04-26 DIAGNOSIS — R634 Abnormal weight loss: Secondary | ICD-10-CM

## 2016-04-26 DIAGNOSIS — R0981 Nasal congestion: Secondary | ICD-10-CM

## 2016-04-26 DIAGNOSIS — Z13220 Encounter for screening for lipoid disorders: Secondary | ICD-10-CM

## 2016-04-26 DIAGNOSIS — N3020 Other chronic cystitis without hematuria: Secondary | ICD-10-CM

## 2016-04-26 MED ORDER — VENLAFAXINE HCL ER 75 MG PO CP24: 75.0000 mg | ORAL_CAPSULE | Freq: Every day | ORAL | Status: DC

## 2016-04-26 NOTE — Patient Instructions (Signed)
Saline nasal spray - flush nose at least 2-3x/day  nasacort nasal spray - 2 sprays each nostril one time per day.  Do this in the evening.  

## 2016-04-26 NOTE — Progress Notes (Signed)
Pre-visit discussion using our clinic review tool. No additional management support is needed unless otherwise documented below in the visit note.  

## 2016-04-26 NOTE — Progress Notes (Signed)
Patient ID: Rose Bernard, female   DOB: 04/02/1966, 50 y.o.   MRN: [id number]   Subjective:    Patient ID: Rose Bernard, female    DOB: [birth year], 50 y.o.   MRN: [id number]  HPI  Patient here for a scheduled follow up.  She has been seen recently for reoccurring sinus infection.  See previous note.  Was treated with levaquin and albuterol inhaler.  She reports she feels better.  With increased nasal congestion.  No chest congestion.  No sob.  No chest pain.  No nausea or vomiting.  Bowels stable.  Planning to follow with urology for another botox injection next week.     Past Medical History  Diagnosis Date  . Frequent UTI   . Chronic cystitis   . Incomplete bladder emptying   . 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 Breast surgery Bilateral 2002    benign mass removed  . Bladder botox  2014, 2015, 2016  . Foot surgery Bilateral   . Breast biopsy Bilateral     neg  . Reconstruction of bladder   1995     2/2 delivery complications with 3rd child   . Appendectomy      2001 due to scar tissue  . Abdominal hysterectomy  1995    2/2 delivery complications with 3rd child. Hysterectomy and bladder repair with subsequent appendectomy. L Ovary remain in place   . Laparoscopic hysterectomy  1995  . Colonoscopy  2014   Family History  Problem Relation Age of Onset  . Colon cancer Father     With ostomy in place   . Breast cancer Paternal Aunt 55   Social History   Social History  . Marital Status: Married    Spouse Name: N/A  . Number of Children: 2  . Years of Education: N/A   Social History Main Topics  . Smoking status: Never Smoker   . Smokeless tobacco: Never Used  . Alcohol Use: No  . Drug Use: No  . Sexual Activity: Yes    Birth Control/ Protection: Surgical   Other Topics Concern  . None   Social History Narrative    Outpatient Encounter Prescriptions as of 04/26/2016  Medication Sig  .  albuterol (PROVENTIL HFA;VENTOLIN HFA) 108 (90 Base) MCG/ACT inhaler Inhale 2 puffs into the lungs every 6 (six) hours as needed for wheezing or shortness of breath.  . nitrofurantoin (MACRODANTIN) 100 MG capsule Take 100 mg by mouth as needed. Reported on 02/08/2016  . [DISCONTINUED] levofloxacin (LEVAQUIN) 750 MG tablet Take 1 tablet (750 mg total) by mouth daily.  . [DISCONTINUED] venlafaxine XR (EFFEXOR XR) 37.5 MG 24 hr capsule Take 1 capsule (37.5 mg total) by mouth daily with breakfast.  . venlafaxine XR (EFFEXOR XR) 75 MG 24 hr capsule Take 1 capsule (75 mg total) by mouth daily with breakfast.   No facility-administered encounter medications on file as of 04/26/2016.    Review of Systems  Constitutional: Negative for appetite change and unexpected weight change.  HENT: Positive for congestion. Negative for sinus pressure.   Respiratory: Negative for cough, chest tightness and shortness of breath.   Cardiovascular: Negative for chest pain, palpitations and leg swelling.  Gastrointestinal: Negative for nausea, vomiting, abdominal pain and diarrhea.  Genitourinary: Negative for vaginal discharge.       Lower pelvic pain improved.   Musculoskeletal: Negative for back pain and joint swelling.  Skin: Negative for color  change and rash.  Neurological: Negative for dizziness, light-headedness and headaches.  Psychiatric/Behavioral: Negative for dysphoric mood and agitation.       Objective:    Physical Exam  Constitutional: She appears well-developed and well-nourished. No distress.  HENT:  Nose: Nose normal.  Mouth/Throat: Oropharynx is clear and moist.  Neck: Neck supple. No thyromegaly present.  Cardiovascular: Normal rate and regular rhythm.   Pulmonary/Chest: Breath sounds normal. No respiratory distress. She has no wheezes.  Abdominal: Soft. Bowel sounds are normal. There is no tenderness.  Musculoskeletal: She exhibits no edema or tenderness.  Lymphadenopathy:    She has no  cervical adenopathy.  Skin: No rash noted. No erythema.  Psychiatric: She has a normal mood and affect. Her behavior is normal.    BP 100/60 mmHg  Pulse 67  Temp(Src) 98 F (36.7 C) (Oral)  Resp 18  Ht 5\' 5"  (1.651 m)  Wt 119 lb 8 oz (54.205 kg)  BMI 19.89 kg/m2  SpO2 97%  LMP 02/07/1994 Wt Readings from Last 3 Encounters:  04/26/16 119 lb 8 oz (54.205 kg)  03/18/16 117 lb 6 oz (53.241 kg)  02/26/16 115 lb (52.164 kg)     Lab Results  Component Value Date   WBC 5.3 10/01/2015   HGB 14.0 10/01/2015   HCT 42.1 10/01/2015   PLT 238.0 10/01/2015   GLUCOSE 82 10/01/2015   CHOL 180 10/16/2014   TRIG 46.0 10/16/2014   HDL 55.80 10/16/2014   LDLCALC 115* 10/16/2014   ALT 14 10/01/2015   AST 14 10/01/2015   NA 138 10/01/2015   K 4.4 10/01/2015   CL 102 10/01/2015   CREATININE 0.79 10/01/2015   BUN 15 10/01/2015   CO2 28 10/01/2015   TSH 2.03 10/01/2015   HGBA1C 5.6 10/01/2015       Assessment & Plan:   Problem List Items Addressed This Visit    Abnormal mammogram    Overdue mammogram.  Need to schedule.        Chronic cystitis - Primary    Followed by Dr Jacqlyn Larsen.        Relevant Orders   Comprehensive metabolic panel   Loss of weight    Eating well.  Weight improved.  Follow.        Sinus congestion    With nasal congestion.  Saline nasal spray.  Symptoms have improved.  Saline flushes and nasacort nasal spray as outlined.  Follow.         Other Visit Diagnoses    Screening cholesterol level        Relevant Orders    Lipid panel        Einar Pheasant, MD

## 2016-05-05 DIAGNOSIS — N3941 Urge incontinence: Secondary | ICD-10-CM | POA: Diagnosis not present

## 2016-05-05 DIAGNOSIS — R350 Frequency of micturition: Secondary | ICD-10-CM | POA: Diagnosis not present

## 2016-05-08 ENCOUNTER — Encounter: Payer: Self-pay | Admitting: Internal Medicine

## 2016-05-08 ENCOUNTER — Other Ambulatory Visit: Payer: Self-pay | Admitting: Internal Medicine

## 2016-05-08 DIAGNOSIS — Z1239 Encounter for other screening for malignant neoplasm of breast: Secondary | ICD-10-CM

## 2016-05-08 NOTE — Progress Notes (Signed)
Order placed for mammogram and message sent to schedule.  Pt overdue.

## 2016-05-08 NOTE — Assessment & Plan Note (Addendum)
With nasal congestion.  Saline nasal spray.  Symptoms have improved.  Saline flushes and nasacort nasal spray as outlined.  Follow.

## 2016-05-08 NOTE — Assessment & Plan Note (Signed)
Overdue mammogram.  Need to schedule.   

## 2016-05-08 NOTE — Assessment & Plan Note (Signed)
Eating well.  Weight improved.  Follow.

## 2016-05-08 NOTE — Assessment & Plan Note (Signed)
Followed by Dr Cope.  

## 2016-05-12 ENCOUNTER — Telehealth: Payer: Self-pay | Admitting: Internal Medicine

## 2016-05-12 NOTE — Telephone Encounter (Signed)
Pt dropped off FMLA papers to be signed by Dr. Nicki Reaper. Papers will be placed in Dr. Bary Leriche box.

## 2016-05-18 DIAGNOSIS — Z7689 Persons encountering health services in other specified circumstances: Secondary | ICD-10-CM

## 2016-05-18 NOTE — Telephone Encounter (Signed)
I placed a copy of form on your desk.  Please complete or have someone to complete.  There is an old one in chart.  Just need to change dates.  Thanks.

## 2016-05-18 NOTE — Telephone Encounter (Signed)
Gave back to Dr. Nicki Reaper to sign, thanks

## 2016-05-19 NOTE — Telephone Encounter (Signed)
Pt notified that form has been completed. Form placed up front for pick up & copy sent to scan & given to billing

## 2016-05-19 NOTE — Telephone Encounter (Signed)
Signed and placed in your box.   

## 2016-05-25 DIAGNOSIS — R339 Retention of urine, unspecified: Secondary | ICD-10-CM | POA: Diagnosis not present

## 2016-05-25 DIAGNOSIS — R350 Frequency of micturition: Secondary | ICD-10-CM | POA: Diagnosis not present

## 2016-05-31 ENCOUNTER — Telehealth: Payer: Self-pay | Admitting: *Deleted

## 2016-05-31 NOTE — Telephone Encounter (Signed)
Patient stated that on her FMLA paperwork, Dr. Nicki Reaper may have made a mistake. Under Bladder flair up Dr. Nicki Reaper put no but said look at previous notes.  The box should be mared yes as it has been for the past few years.  Patient is wondering if the paperwork needs to wait for Dr. Nicki Reaper to return or can she bring it in for another provider to fix it.

## 2016-05-31 NOTE — Telephone Encounter (Signed)
Patient had her FMLA paperwork filled out by Dr Trixie Deis a questioned was missed about flair ups. She questioned if another provider can fill this section out? Pt contact [phone]

## 2016-06-01 NOTE — Telephone Encounter (Signed)
Please have her drop off the paperwork and have Lavella Lemons look at what is needing to be completed.  She does have intermittent "bladder issues".  If it is something just to check, then ok to change and initial.  If she does not need form before my return and there is any question, just hold form until I return to office.  Thanks

## 2016-06-01 NOTE — Telephone Encounter (Signed)
Patient stated that she will bring FMLA paperwork by here for Dr. Nicki Reaper to fill out next week.

## 2016-06-01 NOTE — Telephone Encounter (Signed)
Patients daughter said she will have patient return phone call back.

## 2016-06-20 ENCOUNTER — Other Ambulatory Visit: Payer: Self-pay | Admitting: Internal Medicine

## 2016-06-20 ENCOUNTER — Ambulatory Visit
Admission: RE | Admit: 2016-06-20 | Discharge: 2016-06-20 | Disposition: A | Discharge status: Home / Self Care | Payer: BLUE CROSS/BLUE SHIELD | Source: Ambulatory Visit | Attending: Internal Medicine | Admitting: Internal Medicine

## 2016-06-20 DIAGNOSIS — Z1239 Encounter for other screening for malignant neoplasm of breast: Secondary | ICD-10-CM

## 2016-06-20 DIAGNOSIS — Z1231 Encounter for screening mammogram for malignant neoplasm of breast: Secondary | ICD-10-CM | POA: Insufficient documentation

## 2016-06-28 NOTE — Telephone Encounter (Signed)
Pt came in and dropped off  FMLA paperwork #7 needed to have as well as flair ups.. Please advise pt with and questions.. Placed in Dr. Lars Mage Boxs

## 2016-07-03 NOTE — Telephone Encounter (Signed)
I have added the information to the question.  Form placed in your box.

## 2016-07-04 NOTE — Telephone Encounter (Signed)
Pt notified & form placed up front for pick up.

## 2016-08-02 DIAGNOSIS — D225 Melanocytic nevi of trunk: Secondary | ICD-10-CM | POA: Diagnosis not present

## 2016-08-02 DIAGNOSIS — L814 Other melanin hyperpigmentation: Secondary | ICD-10-CM | POA: Diagnosis not present

## 2016-08-02 DIAGNOSIS — X32XXXA Exposure to sunlight, initial encounter: Secondary | ICD-10-CM | POA: Diagnosis not present

## 2016-08-02 DIAGNOSIS — L570 Actinic keratosis: Secondary | ICD-10-CM | POA: Diagnosis not present

## 2016-08-02 DIAGNOSIS — B078 Other viral warts: Secondary | ICD-10-CM | POA: Diagnosis not present

## 2016-08-02 DIAGNOSIS — D485 Neoplasm of uncertain behavior of skin: Secondary | ICD-10-CM | POA: Diagnosis not present

## 2016-08-04 ENCOUNTER — Ambulatory Visit (INDEPENDENT_AMBULATORY_CARE_PROVIDER_SITE_OTHER): Payer: BLUE CROSS/BLUE SHIELD | Admitting: Internal Medicine

## 2016-08-04 ENCOUNTER — Encounter: Payer: Self-pay | Admitting: Internal Medicine

## 2016-08-04 DIAGNOSIS — R634 Abnormal weight loss: Secondary | ICD-10-CM | POA: Diagnosis not present

## 2016-08-04 DIAGNOSIS — R1032 Left lower quadrant pain: Secondary | ICD-10-CM

## 2016-08-04 DIAGNOSIS — R339 Retention of urine, unspecified: Secondary | ICD-10-CM | POA: Diagnosis not present

## 2016-08-04 DIAGNOSIS — R928 Other abnormal and inconclusive findings on diagnostic imaging of breast: Secondary | ICD-10-CM

## 2016-08-04 NOTE — Progress Notes (Signed)
Pre-visit discussion using our clinic review tool. No additional management support is needed unless otherwise documented below in the visit note.  

## 2016-08-04 NOTE — Progress Notes (Signed)
Patient ID: Rose Bernard, female   DOB: [birth year], 50 y.o.   MRN: [id number]   Subjective:    Patient ID: Rose Bernard, female    DOB: 30-May-1966, 50 y.o.   MRN: [id number]  HPI  Patient here for a scheduled follow up.  She is overall doing better.  Received botox - bladder 04/2016.  Is better. Followed by Dr Jacqlyn Larsen.  Her abdominal/lower pelvic discomfort - better.  Eating.  Weight is stable.  No nausea or vomiting.  Breathing stable.  Recently saw dermatology.  Had several lesions removed.  Planning to f/u with dermatology about her lower left leg lesion.  Overall feels doing relatively well.    Past Medical History:  Diagnosis Date  . Chronic cystitis   . Frequent UTI   . Incomplete bladder emptying   . 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 ABDOMINAL HYSTERECTOMY  1995   2/2 delivery complications with 3rd child. Hysterectomy and bladder repair with subsequent appendectomy. L Ovary remain in place   . APPENDECTOMY     2001 due to scar tissue  . BLADDER BOTOX  2014, 2015, 2016  . BREAST BIOPSY Bilateral 2001   neg  . BREAST SURGERY Bilateral 2002   benign mass removed  . COLONOSCOPY  2014  . FOOT SURGERY Bilateral   . LAPAROSCOPIC HYSTERECTOMY  1995  . reconstruction of bladder   1995    2/2 delivery complications with 3rd child    Family History  Problem Relation Age of Onset  . Colon cancer Father     With ostomy in place   . Breast cancer Paternal Aunt 19   Social History   Social History  . Marital status: Married    Spouse name: N/A  . Number of children: 2  . Years of education: N/A   Social History Main Topics  . Smoking status: Never Smoker  . Smokeless tobacco: Never Used  . Alcohol use No  . Drug use: No  . Sexual activity: Yes    Birth control/ protection: Surgical   Other Topics Concern  . None   Social History Narrative  . None    Outpatient Encounter Prescriptions as of  08/04/2016  Medication Sig  . albuterol (PROVENTIL HFA;VENTOLIN HFA) 108 (90 Base) MCG/ACT inhaler Inhale 2 puffs into the lungs every 6 (six) hours as needed for wheezing or shortness of breath.  . nitrofurantoin (MACRODANTIN) 100 MG capsule Take 100 mg by mouth as needed. Reported on 02/08/2016  . venlafaxine XR (EFFEXOR XR) 75 MG 24 hr capsule Take 1 capsule (75 mg total) by mouth daily with breakfast.   No facility-administered encounter medications on file as of 08/04/2016.     Review of Systems  Constitutional: Negative for appetite change and unexpected weight change.  HENT: Negative for congestion and sinus pressure.   Respiratory: Negative for cough, chest tightness and shortness of breath.   Cardiovascular: Negative for chest pain, palpitations and leg swelling.  Gastrointestinal: Negative for diarrhea, nausea and vomiting.       Abdominal/pelvic discomfort improved.    Genitourinary: Negative for difficulty urinating and dysuria.       Bladder - better.   Musculoskeletal: Negative for joint swelling and myalgias.  Skin: Negative for color change and rash.  Neurological: Negative for dizziness, light-headedness and headaches.  Psychiatric/Behavioral: Negative for agitation and dysphoric mood.       Objective:    Physical Exam  Constitutional: She appears well-developed and well-nourished. No distress.  HENT:  Nose: Nose normal.  Mouth/Throat: Oropharynx is clear and moist.  Neck: Neck supple. No thyromegaly present.  Cardiovascular: Normal rate and regular rhythm.   Pulmonary/Chest: Breath sounds normal. No respiratory distress. She has no wheezes.  Abdominal: Soft. Bowel sounds are normal. There is no tenderness.  Musculoskeletal: She exhibits no edema or tenderness.  Lymphadenopathy:    She has no cervical adenopathy.  Skin: No rash noted. No erythema.  Psychiatric: She has a normal mood and affect. Her behavior is normal.    BP 100/70   Pulse 69   Temp 97.6 F  (36.4 C) (Oral)   Resp 18   Ht 5\' 5"  (1.651 m)   Wt 122 lb 2 oz (55.4 kg)   LMP 02/07/1994   SpO2 98%   BMI 20.32 kg/m  Wt Readings from Last 3 Encounters:  08/04/16 122 lb 2 oz (55.4 kg)  04/26/16 119 lb 8 oz (54.2 kg)  03/18/16 117 lb 6 oz (53.2 kg)     Lab Results  Component Value Date   WBC 5.3 10/01/2015   HGB 14.0 10/01/2015   HCT 42.1 10/01/2015   PLT 238.0 10/01/2015   GLUCOSE 82 10/01/2015   CHOL 180 10/16/2014   TRIG 46.0 10/16/2014   HDL 55.80 10/16/2014   LDLCALC 115 (H) 10/16/2014   ALT 14 10/01/2015   AST 14 10/01/2015   NA 138 10/01/2015   K 4.4 10/01/2015   CL 102 10/01/2015   CREATININE 0.79 10/01/2015   BUN 15 10/01/2015   CO2 28 10/01/2015   TSH 2.03 10/01/2015   HGBA1C 5.6 10/01/2015    Mm Screening Breast Tomo Bilateral  Result Date: 06/21/2016 CLINICAL DATA:  Screening. EXAM: 2D DIGITAL SCREENING BILATERAL MAMMOGRAM WITH CAD AND ADJUNCT TOMO COMPARISON:  Previous exam(s). ACR Breast Density Category c: The breast tissue is heterogeneously dense, which may obscure small masses. FINDINGS: There are no findings suspicious for malignancy. Images were processed with CAD. IMPRESSION: No mammographic evidence of malignancy. A result letter of this screening mammogram will be mailed directly to the patient. RECOMMENDATION: Screening mammogram in one year. (Code:SM-B-01Y) BI-RADS CATEGORY  1: Negative. Electronically Signed   By: Dorise Bullion III M.D   On: 06/21/2016 09:25       Assessment & Plan:   Problem List Items Addressed This Visit    Abdominal pain, left lower quadrant    Improved.  Follow.       Abnormal mammogram    Mammogram 06/21/16 - birads I.       Incomplete bladder emptying    Followed by Dr Jacqlyn Larsen.  Bladder doing better.  S/p botox - 04/2016.        Loss of weight    Eating.  Weight is stable.  Follow.        Other Visit Diagnoses   None.      Einar Pheasant, MD

## 2016-08-05 ENCOUNTER — Encounter: Payer: Self-pay | Admitting: Internal Medicine

## 2016-08-05 NOTE — Assessment & Plan Note (Signed)
Mammogram 06/21/16 - birads I.

## 2016-08-05 NOTE — Assessment & Plan Note (Signed)
Followed by Dr Jacqlyn Larsen.  Bladder doing better.  S/p botox - 04/2016.

## 2016-08-05 NOTE — Assessment & Plan Note (Signed)
Eating.  Weight is stable.  Follow.

## 2016-08-05 NOTE — Assessment & Plan Note (Signed)
Improved.  Follow.  

## 2016-08-10 DIAGNOSIS — R350 Frequency of micturition: Secondary | ICD-10-CM | POA: Diagnosis not present

## 2016-08-10 DIAGNOSIS — R339 Retention of urine, unspecified: Secondary | ICD-10-CM | POA: Diagnosis not present

## 2016-08-10 DIAGNOSIS — N3020 Other chronic cystitis without hematuria: Secondary | ICD-10-CM | POA: Diagnosis not present

## 2016-08-10 DIAGNOSIS — N3941 Urge incontinence: Secondary | ICD-10-CM | POA: Diagnosis not present

## 2016-08-10 DIAGNOSIS — N1370 Vesicoureteral-reflux, unspecified: Secondary | ICD-10-CM | POA: Diagnosis not present

## 2016-09-06 DIAGNOSIS — L570 Actinic keratosis: Secondary | ICD-10-CM | POA: Diagnosis not present

## 2016-09-06 DIAGNOSIS — X32XXXA Exposure to sunlight, initial encounter: Secondary | ICD-10-CM | POA: Diagnosis not present

## 2016-12-06 DIAGNOSIS — N3941 Urge incontinence: Secondary | ICD-10-CM | POA: Diagnosis not present

## 2016-12-08 ENCOUNTER — Encounter: Payer: Self-pay | Admitting: Internal Medicine

## 2016-12-08 ENCOUNTER — Other Ambulatory Visit (HOSPITAL_COMMUNITY)
Admission: RE | Admit: 2016-12-08 | Discharge: 2016-12-08 | Disposition: A | Discharge status: Home / Self Care | Payer: BLUE CROSS/BLUE SHIELD | Source: Ambulatory Visit | Attending: Internal Medicine | Admitting: Internal Medicine

## 2016-12-08 ENCOUNTER — Ambulatory Visit (INDEPENDENT_AMBULATORY_CARE_PROVIDER_SITE_OTHER): Payer: BLUE CROSS/BLUE SHIELD | Admitting: Internal Medicine

## 2016-12-08 VITALS — BP 120/64 | HR 66 | Temp 97.5°F | Ht 65.5 in | Wt 125.0 lb

## 2016-12-08 DIAGNOSIS — Z0000 Encounter for general adult medical examination without abnormal findings: Secondary | ICD-10-CM | POA: Diagnosis not present

## 2016-12-08 DIAGNOSIS — R634 Abnormal weight loss: Secondary | ICD-10-CM | POA: Diagnosis not present

## 2016-12-08 DIAGNOSIS — Z1151 Encounter for screening for human papillomavirus (HPV): Secondary | ICD-10-CM | POA: Insufficient documentation

## 2016-12-08 DIAGNOSIS — R350 Frequency of micturition: Secondary | ICD-10-CM

## 2016-12-08 DIAGNOSIS — Z01419 Encounter for gynecological examination (general) (routine) without abnormal findings: Secondary | ICD-10-CM | POA: Insufficient documentation

## 2016-12-08 DIAGNOSIS — Z124 Encounter for screening for malignant neoplasm of cervix: Secondary | ICD-10-CM | POA: Diagnosis not present

## 2016-12-08 DIAGNOSIS — R0981 Nasal congestion: Secondary | ICD-10-CM | POA: Diagnosis not present

## 2016-12-08 NOTE — Progress Notes (Signed)
Pre visit review using our clinic review tool, if applicable. No additional management support is needed unless otherwise documented below in the visit note. 

## 2016-12-08 NOTE — Progress Notes (Signed)
Patient ID: Rose Bernard, female   DOB: [birth year], 50 y.o.   MRN: [id number]   Subjective:    Patient ID: Rose Bernard, female    DOB: 04/24/1966, 49 y.o.   MRN: [id number]  HPI  Patient here for her physical exam.  She reports she is doing relatively well.  Increased stress with some family issues.  Overall she feels she is doing relatively well.  Handling things relatively well.  Does not feel needs any further intervention.  Stays active.  No chest pain.  No sob. No acid reflux.  Just saw Dr Jacqlyn Larsen 12/06/16.  S/p bladder botox injection.  Still has some discomfort, but overall improved.  Eating better.  Weight is up.  Bowels stable. Some allergy symptoms - nasal congestion.    Past Medical History:  Diagnosis Date  . Chronic cystitis   . Frequent UTI   . Incomplete bladder emptying   . 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 ABDOMINAL HYSTERECTOMY  1995   2/2 delivery complications with 3rd child. Hysterectomy and bladder repair with subsequent appendectomy. L Ovary remain in place   . APPENDECTOMY     2001 due to scar tissue  . BLADDER BOTOX  2014, 2015, 2016  . BREAST BIOPSY Bilateral 2001   neg  . BREAST SURGERY Bilateral 2002   benign mass removed  . COLONOSCOPY  2014  . FOOT SURGERY Bilateral   . LAPAROSCOPIC HYSTERECTOMY  1995  . reconstruction of bladder   1995    2/2 delivery complications with 3rd child    Family History  Problem Relation Age of Onset  . Colon cancer Father     With ostomy in place   . Breast cancer Paternal Aunt 12   Social History   Social History  . Marital status: Married    Spouse name: N/A  . Number of children: 2  . Years of education: N/A   Social History Main Topics  . Smoking status: Never Smoker  . Smokeless tobacco: Never Used  . Alcohol use No  . Drug use: No  . Sexual activity: Yes    Birth control/ protection: Surgical   Other Topics Concern  .  None   Social History Narrative  . None    Outpatient Encounter Prescriptions as of 12/08/2016  Medication Sig  . nitrofurantoin (MACRODANTIN) 100 MG capsule Take 100 mg by mouth as needed. Reported on 02/08/2016  . [DISCONTINUED] albuterol (PROVENTIL HFA;VENTOLIN HFA) 108 (90 Base) MCG/ACT inhaler Inhale 2 puffs into the lungs every 6 (six) hours as needed for wheezing or shortness of breath. (Patient not taking: Reported on 12/08/2016)  . [DISCONTINUED] venlafaxine XR (EFFEXOR XR) 75 MG 24 hr capsule Take 1 capsule (75 mg total) by mouth daily with breakfast. (Patient not taking: Reported on 12/08/2016)   No facility-administered encounter medications on file as of 12/08/2016.     Review of Systems  Constitutional: Negative for appetite change and unexpected weight change.  HENT: Positive for congestion. Negative for sinus pressure.   Respiratory: Negative for cough, chest tightness and shortness of breath.   Cardiovascular: Negative for chest pain, palpitations and leg swelling.  Gastrointestinal: Negative for abdominal pain, diarrhea, nausea and vomiting.  Genitourinary: Negative for dysuria.       S/p bladder botox injection.  Pressure better.    Musculoskeletal: Negative for back pain and joint swelling.  Skin: Negative for color change and rash.  Neurological: Negative  for dizziness, light-headedness and headaches.  Psychiatric/Behavioral: Negative for agitation and dysphoric mood.       Objective:    Physical Exam  Constitutional: She is oriented to person, place, and time. She appears well-developed and well-nourished. No distress.  HENT:  Nose: Nose normal.  Mouth/Throat: Oropharynx is clear and moist.  Eyes: Right eye exhibits no discharge. Left eye exhibits no discharge. No scleral icterus.  Neck: Neck supple. No thyromegaly present.  Cardiovascular: Normal rate and regular rhythm.   Pulmonary/Chest: Breath sounds normal. No accessory muscle usage. No tachypnea. No  respiratory distress. She has no decreased breath sounds. She has no wheezes. She has no rhonchi. Right breast exhibits no inverted nipple, no mass, no nipple discharge and no tenderness (no axillary adenopathy). Left breast exhibits no inverted nipple, no mass, no nipple discharge and no tenderness (no axilarry adenopathy).  Abdominal: Soft. Bowel sounds are normal. There is no tenderness.  Genitourinary:  Genitourinary Comments: GU - normal external genitalia.  Vaginal vault without lesions.  Atrophy changes present.  Could not appreciate any adnexal masses or tenderness.    Musculoskeletal: She exhibits no edema or tenderness.  Lymphadenopathy:    She has no cervical adenopathy.  Neurological: She is alert and oriented to person, place, and time.  Skin: Skin is warm. No rash noted. No erythema.  Psychiatric: She has a normal mood and affect. Her behavior is normal.    BP 120/64   Pulse 66   Temp 97.5 F (36.4 C) (Oral)   Ht 5' 5.5" (1.664 m)   Wt 125 lb (56.7 kg)   LMP 02/07/1994   SpO2 95%   BMI 20.48 kg/m  Wt Readings from Last 3 Encounters:  12/08/16 125 lb (56.7 kg)  08/04/16 122 lb 2 oz (55.4 kg)  04/26/16 119 lb 8 oz (54.2 kg)     Lab Results  Component Value Date   WBC 5.3 10/01/2015   HGB 14.0 10/01/2015   HCT 42.1 10/01/2015   PLT 238.0 10/01/2015   GLUCOSE 82 10/01/2015   CHOL 180 10/16/2014   TRIG 46.0 10/16/2014   HDL 55.80 10/16/2014   LDLCALC 115 (H) 10/16/2014   ALT 14 10/01/2015   AST 14 10/01/2015   NA 138 10/01/2015   K 4.4 10/01/2015   CL 102 10/01/2015   CREATININE 0.79 10/01/2015   BUN 15 10/01/2015   CO2 28 10/01/2015   TSH 2.03 10/01/2015   HGBA1C 5.6 10/01/2015    Mm Screening Breast Tomo Bilateral  Result Date: 06/21/2016 CLINICAL DATA:  Screening. EXAM: 2D DIGITAL SCREENING BILATERAL MAMMOGRAM WITH CAD AND ADJUNCT TOMO COMPARISON:  Previous exam(s). ACR Breast Density Category c: The breast tissue is heterogeneously dense, which may  obscure small masses. FINDINGS: There are no findings suspicious for malignancy. Images were processed with CAD. IMPRESSION: No mammographic evidence of malignancy. A result letter of this screening mammogram will be mailed directly to the patient. RECOMMENDATION: Screening mammogram in one year. (Code:SM-B-01Y) BI-RADS CATEGORY  1: Negative. Electronically Signed   By: Dorise Bullion III M.D   On: 06/21/2016 09:25       Assessment & Plan:   Problem List Items Addressed This Visit    Health care maintenance    Physical today 12/08/16.  PAP 12/08/16.  Colonoscopy 02/13/13 - internal hemorrhoids otherwise normal.  Recommended f/u colonoscopy in five years.  Mammogram 06/21/16 - Birads I.       Loss of weight    Eating well.  Weight is up.  Follow.       Sinus congestion    Saline nasal spray and flonase/nasacort nasal spray as directed.  mucinex as directed.  Follow.        Urinary frequency    Followed by Dr Jacqlyn Larsen.  Just evaluated 12/06/16.  S/p bladder botox injection.  Overall improved.  Follow.        Other Visit Diagnoses    Pap smear for cervical cancer screening    -  Primary   Relevant Orders   Cytology - PAP       Einar Pheasant, MD

## 2016-12-09 ENCOUNTER — Encounter: Payer: Self-pay | Admitting: Internal Medicine

## 2016-12-09 NOTE — Assessment & Plan Note (Signed)
Eating well.  Weight is up.  Follow.   

## 2016-12-09 NOTE — Assessment & Plan Note (Signed)
Saline nasal spray and flonase/nasacort nasal spray as directed.  mucinex as directed.  Follow.

## 2016-12-09 NOTE — Assessment & Plan Note (Signed)
Physical today 12/08/16.  PAP 12/08/16.  Colonoscopy 02/13/13 - internal hemorrhoids otherwise normal.  Recommended f/u colonoscopy in five years.  Mammogram 06/21/16 - Birads I.

## 2016-12-09 NOTE — Assessment & Plan Note (Signed)
Followed by Dr Jacqlyn Larsen.  Just evaluated 12/06/16.  S/p bladder botox injection.  Overall improved.  Follow.

## 2016-12-13 LAB — CYTOLOGY - PAP
DIAGNOSIS: NEGATIVE
HPV: NOT DETECTED

## 2017-01-11 DIAGNOSIS — R339 Retention of urine, unspecified: Secondary | ICD-10-CM | POA: Diagnosis not present

## 2017-01-11 DIAGNOSIS — N3020 Other chronic cystitis without hematuria: Secondary | ICD-10-CM | POA: Diagnosis not present

## 2017-01-18 DIAGNOSIS — N3020 Other chronic cystitis without hematuria: Secondary | ICD-10-CM | POA: Diagnosis not present

## 2017-04-12 DIAGNOSIS — N3281 Overactive bladder: Secondary | ICD-10-CM | POA: Diagnosis not present

## 2017-04-12 DIAGNOSIS — N1370 Vesicoureteral-reflux, unspecified: Secondary | ICD-10-CM | POA: Diagnosis not present

## 2017-04-12 DIAGNOSIS — N3941 Urge incontinence: Secondary | ICD-10-CM | POA: Diagnosis not present

## 2017-04-12 DIAGNOSIS — N3020 Other chronic cystitis without hematuria: Secondary | ICD-10-CM | POA: Diagnosis not present

## 2017-04-12 DIAGNOSIS — R339 Retention of urine, unspecified: Secondary | ICD-10-CM | POA: Diagnosis not present

## 2017-04-17 ENCOUNTER — Telehealth: Payer: Self-pay | Admitting: Internal Medicine

## 2017-04-17 NOTE — Telephone Encounter (Signed)
Pt dropped off FMLA paperwork to be filled out. Placed in Dr. Nicki Reaper color folder upfront. Please advise. Pt stated that if its complete by Friday her husband would pick it up at his appt.

## 2017-04-18 DIAGNOSIS — Z7689 Persons encountering health services in other specified circumstances: Secondary | ICD-10-CM

## 2017-04-19 NOTE — Telephone Encounter (Signed)
Filled out and given to Dr.Scott to sign

## 2017-04-20 NOTE — Telephone Encounter (Signed)
Let me know when finished.

## 2017-04-21 NOTE — Telephone Encounter (Signed)
Form completed.  Placed in box.  Make copy for our records and give copy ot husband.

## 2017-04-21 NOTE — Telephone Encounter (Signed)
Copy faxed, Copy put in charge folder and copy left at reception to be picked up. I have l/m on voice mail to let patient know all informtion.

## 2017-06-01 DIAGNOSIS — N3281 Overactive bladder: Secondary | ICD-10-CM | POA: Diagnosis not present

## 2017-06-08 ENCOUNTER — Ambulatory Visit (INDEPENDENT_AMBULATORY_CARE_PROVIDER_SITE_OTHER): Payer: BLUE CROSS/BLUE SHIELD | Admitting: Internal Medicine

## 2017-06-08 ENCOUNTER — Encounter: Payer: Self-pay | Admitting: Internal Medicine

## 2017-06-08 VITALS — BP 120/64 | HR 62 | Temp 98.2°F | Resp 12 | Ht 66.0 in | Wt 130.2 lb

## 2017-06-08 DIAGNOSIS — N319 Neuromuscular dysfunction of bladder, unspecified: Secondary | ICD-10-CM | POA: Diagnosis not present

## 2017-06-08 DIAGNOSIS — Z1239 Encounter for other screening for malignant neoplasm of breast: Secondary | ICD-10-CM

## 2017-06-08 DIAGNOSIS — R51 Headache: Secondary | ICD-10-CM | POA: Diagnosis not present

## 2017-06-08 DIAGNOSIS — R519 Headache, unspecified: Secondary | ICD-10-CM

## 2017-06-08 DIAGNOSIS — R232 Flushing: Secondary | ICD-10-CM

## 2017-06-08 DIAGNOSIS — Z1231 Encounter for screening mammogram for malignant neoplasm of breast: Secondary | ICD-10-CM | POA: Diagnosis not present

## 2017-06-08 DIAGNOSIS — R0981 Nasal congestion: Secondary | ICD-10-CM

## 2017-06-08 MED ORDER — VENLAFAXINE HCL ER 37.5 MG PO CP24: ORAL_CAPSULE | ORAL | 2 refills | Status: DC

## 2017-06-08 NOTE — Progress Notes (Signed)
Patient ID: Rose Bernard, female   DOB: 12-23-1965, 51 y.o.   MRN: [id number]   Subjective:    Patient ID: Rose Bernard, female    DOB: [birth year], 51 y.o.   MRN: [id number]  HPI  Patient here for a scheduled follow up.  She reports having intermittent head pain.  Posterior head and extends up her head.  Has been intermittent for months.  No known triggers.  States has noticed some previous dizziness.  No dizziness now.  Relates some her pressure to sinus congestion.  No chest pain.  No sob.  No acid reflux.  No abdominal pain or cramping.  Bowels stable.  Having hot flashes.  Not sleeping as well.  Increased stress.  Overall feels she is handling things relatively.     Past Medical History:  Diagnosis Date  . Chronic cystitis   . Frequent UTI   . Incomplete bladder emptying   . 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 ABDOMINAL HYSTERECTOMY  1995   2/2 delivery complications with 3rd child. Hysterectomy and bladder repair with subsequent appendectomy. L Ovary remain in place   . APPENDECTOMY     2001 due to scar tissue  . BLADDER BOTOX  2014, 2015, 2016  . BREAST BIOPSY Bilateral 2001   neg  . BREAST SURGERY Bilateral 2002   benign mass removed  . COLONOSCOPY  2014  . FOOT SURGERY Bilateral   . LAPAROSCOPIC HYSTERECTOMY  1995  . reconstruction of bladder   1995    2/2 delivery complications with 3rd child    Family History  Problem Relation Age of Onset  . Colon cancer Father        With ostomy in place   . Breast cancer Paternal Aunt 71   Social History   Social History  . Marital status: Married    Spouse name: N/A  . Number of children: 2  . Years of education: N/A   Social History Main Topics  . Smoking status: Never Smoker  . Smokeless tobacco: Never Used  . Alcohol use No  . Drug use: No  . Sexual activity: Yes    Birth control/ protection: Surgical   Other Topics Concern  . None    Social History Narrative  . None    Outpatient Encounter Prescriptions as of 06/08/2017  Medication Sig  . [DISCONTINUED] nitrofurantoin (MACRODANTIN) 100 MG capsule Take 100 mg by mouth as needed. Reported on 02/08/2016  . [DISCONTINUED] venlafaxine XR (EFFEXOR XR) 37.5 MG 24 hr capsule Take 1-2 per day   No facility-administered encounter medications on file as of 06/08/2017.     Review of Systems  Constitutional: Negative for appetite change and unexpected weight change.  HENT: Positive for congestion and postnasal drip.   Respiratory: Negative for cough, chest tightness and shortness of breath.   Cardiovascular: Negative for chest pain, palpitations and leg swelling.  Gastrointestinal: Negative for diarrhea, nausea and vomiting.  Genitourinary: Negative for dysuria and vaginal bleeding.  Musculoskeletal: Negative for back pain and neck pain.  Skin: Negative for color change and rash.  Neurological: Positive for headaches.       Occasional dizziness as outlined.    Hematological: Negative for adenopathy.  Psychiatric/Behavioral: Negative for agitation and dysphoric mood.       Objective:    Physical Exam  Constitutional: She appears well-developed and well-nourished. No distress.  HENT:  Nose: Nose normal.  Mouth/Throat: Oropharynx is clear  and moist.  Neck: Neck supple. No thyromegaly present.  Cardiovascular: Normal rate and regular rhythm.   Pulmonary/Chest: Breath sounds normal. No respiratory distress. She has no wheezes.  Abdominal: Soft. Bowel sounds are normal. There is no tenderness.  Musculoskeletal: She exhibits no edema or tenderness.  Lymphadenopathy:    She has no cervical adenopathy.  Skin: No rash noted. No erythema.  Psychiatric: She has a normal mood and affect. Her behavior is normal.    BP 120/64 (BP Location: Left Arm, Patient Position: Sitting, Cuff Size: Normal)   Pulse 62   Temp 98.2 F (36.8 C) (Oral)   Resp 12   Ht 5\' 6"  (1.676 m)   Wt  130 lb 3.2 oz (59.1 kg)   LMP 02/07/1994   SpO2 98%   BMI 21.01 kg/m  Wt Readings from Last 3 Encounters:  06/08/17 130 lb 3.2 oz (59.1 kg)  12/08/16 125 lb (56.7 kg)  08/04/16 122 lb 2 oz (55.4 kg)     Lab Results  Component Value Date   WBC 5.3 10/01/2015   HGB 14.0 10/01/2015   HCT 42.1 10/01/2015   PLT 238.0 10/01/2015   GLUCOSE 82 10/01/2015   CHOL 180 10/16/2014   TRIG 46.0 10/16/2014   HDL 55.80 10/16/2014   LDLCALC 115 (H) 10/16/2014   ALT 14 10/01/2015   AST 14 10/01/2015   NA 138 10/01/2015   K 4.4 10/01/2015   CL 102 10/01/2015   CREATININE 0.79 10/01/2015   BUN 15 10/01/2015   CO2 28 10/01/2015   TSH 2.03 10/01/2015   HGBA1C 5.6 10/01/2015       Assessment & Plan:   Problem List Items Addressed This Visit    Disorder of bladder function    Followed by Dr Jacqlyn Larsen.       Headache    Intermittent headaches.  Discussed further evaluation.  Discussed possible neck issues contributing.  Also discussed obtaining head scan.  She declines.  Wants to try effexor and see if helps.  Follow.  Notify me if symptoms change or if she changes her mind.  Treat nasal congestion.       Hot flashes    Discussed with her again today.  Discussed estrogen therapy.  Also discussed retrying effexor.  She feels may not have given enough time to see if helped.  Prescribed effexor.  Follow closely.        Sinus congestion    Some intermittent congestion.  Feels may contribute to her head symptoms.  Saline nasal spray and steroid nasal spray as directed.  Follow.         Other Visit Diagnoses    Breast cancer screening    -  Primary   Relevant Orders   MM DIGITAL SCREENING BILATERAL       Einar Pheasant, MD

## 2017-06-08 NOTE — Progress Notes (Signed)
Pre-visit discussion using our clinic review tool. No additional management support is needed unless otherwise documented below in the visit note.  

## 2017-06-08 NOTE — Patient Instructions (Signed)
Take one capsule per day for 10 days and then increase to two capsules per day.

## 2017-06-09 ENCOUNTER — Telehealth: Payer: Self-pay | Admitting: *Deleted

## 2017-06-09 MED ORDER — VENLAFAXINE HCL ER 75 MG PO CP24: 75.0000 mg | ORAL_CAPSULE | Freq: Every day | ORAL | 1 refills | Status: DC

## 2017-06-09 NOTE — Telephone Encounter (Signed)
Notify pt that I sent in rx for effexor 75mg  q day.  Just take one per day of this dose.  Let us know if any problems.

## 2017-06-09 NOTE — Telephone Encounter (Signed)
Patient stated that her insurance will not cover her medication for venlafaxine unless it is written for once a day. Pharmacy [facility]

## 2017-06-09 NOTE — Telephone Encounter (Signed)
Called patient and l/m that you are out of the office until Monday and we will get a answer for her then

## 2017-06-10 ENCOUNTER — Encounter: Payer: Self-pay | Admitting: Internal Medicine

## 2017-06-10 NOTE — Assessment & Plan Note (Signed)
Discussed with her again today.  Discussed estrogen therapy.  Also discussed retrying effexor.  She feels may not have given enough time to see if helped.  Prescribed effexor.  Follow closely.

## 2017-06-10 NOTE — Assessment & Plan Note (Signed)
Some intermittent congestion.  Feels may contribute to her head symptoms.  Saline nasal spray and steroid nasal spray as directed.  Follow.

## 2017-06-10 NOTE — Assessment & Plan Note (Signed)
Followed by Dr Cope.  

## 2017-06-10 NOTE — Assessment & Plan Note (Addendum)
Intermittent headaches.  Discussed further evaluation.  Discussed possible neck issues contributing.  Also discussed obtaining head scan.  She declines.  Wants to try effexor and see if helps.  Follow.  Notify me if symptoms change or if she changes her mind.  Treat nasal congestion.

## 2017-06-12 DIAGNOSIS — R339 Retention of urine, unspecified: Secondary | ICD-10-CM | POA: Diagnosis not present

## 2017-06-12 DIAGNOSIS — N3020 Other chronic cystitis without hematuria: Secondary | ICD-10-CM | POA: Diagnosis not present

## 2017-06-12 NOTE — Telephone Encounter (Signed)
Left message to return call to our office.  

## 2017-06-12 NOTE — Telephone Encounter (Signed)
Pt called back returning your call. Thank you! °

## 2017-06-12 NOTE — Telephone Encounter (Signed)
Patient informed she picked up yesterday only taking one a day.

## 2017-06-19 ENCOUNTER — Telehealth: Payer: Self-pay | Admitting: Internal Medicine

## 2017-06-19 DIAGNOSIS — R232 Flushing: Secondary | ICD-10-CM

## 2017-06-19 NOTE — Telephone Encounter (Signed)
Pt called and stated that she is having issues taking the venlafaxine XR (EFFEXOR XR) 75 MG 24 hr capsule. Pt states that it makes her drowsy, chills, and nausea. Please advise, thank you!  Call pt @ [phone]

## 2017-06-19 NOTE — Telephone Encounter (Signed)
Patient informed will call in a few days and let us know how she is feeling.

## 2017-06-19 NOTE — Telephone Encounter (Signed)
If feels related to the medication, then stop the medication and see if symptome resolve.  If any worsening symptoms or change in symptoms, needs to be evaluated.

## 2017-06-19 NOTE — Telephone Encounter (Signed)
Patient states symptoms started yesterday. She took for first time yesterday and symptoms started about 4 hrs later.

## 2017-06-27 ENCOUNTER — Telehealth: Payer: Self-pay | Admitting: *Deleted

## 2017-06-27 NOTE — Telephone Encounter (Signed)
Patient stated that she continues with the headaches and hot flashes after stopping this medication.  Pt contact [phone]

## 2017-06-27 NOTE — Telephone Encounter (Signed)
Called patient states that the symptoms have gotten better as far as felling "Knocked out" but she is still having hot flashes and head ache. Let her know that you are out of the office today.

## 2017-06-27 NOTE — Telephone Encounter (Signed)
If having persistent headaches, needs to be seen.  Regarding the hot flashes, I recommend gyn evaluation since what we have tried has not helped or she has not tolerated.  Would like for her to see them to see about treatment recs.  Let me know if agreeable and I will place the order for the referral.  Not sure if headaches are related, so if persistent headaches needs evaluation to confirm nothing more acute going on.

## 2017-06-27 NOTE — Telephone Encounter (Signed)
Opened in error, see previous note.

## 2017-06-28 ENCOUNTER — Ambulatory Visit
Admission: RE | Admit: 2017-06-28 | Discharge: 2017-06-28 | Disposition: A | Discharge status: Home / Self Care | Payer: BLUE CROSS/BLUE SHIELD | Source: Ambulatory Visit | Attending: Internal Medicine | Admitting: Internal Medicine

## 2017-06-28 DIAGNOSIS — Z1239 Encounter for other screening for malignant neoplasm of breast: Secondary | ICD-10-CM

## 2017-06-28 DIAGNOSIS — Z1231 Encounter for screening mammogram for malignant neoplasm of breast: Secondary | ICD-10-CM | POA: Diagnosis not present

## 2017-06-28 NOTE — Telephone Encounter (Signed)
Left message to return call to our office.  

## 2017-06-29 NOTE — Telephone Encounter (Signed)
Called patient reviewed all information she would like GYN local does not have one she prefers. She states headaches are not often. I have suggested that she keep a log of location/duration and pain level and give [place] a call if she notices that they are continuing for evaluation and bring log with her to help with history.

## 2017-06-30 NOTE — Telephone Encounter (Signed)
Order placed for gyn referral.  

## 2017-07-31 ENCOUNTER — Encounter: Payer: Self-pay | Admitting: Physician Assistant

## 2017-07-31 ENCOUNTER — Emergency Department
Admission: EM | Admit: 2017-07-31 | Discharge: 2017-07-31 | Disposition: A | Discharge status: Home / Self Care | Payer: BLUE CROSS/BLUE SHIELD | Attending: Student in an Organized Health Care Education/Training Program | Admitting: Student in an Organized Health Care Education/Training Program

## 2017-07-31 DIAGNOSIS — S68129A Partial traumatic metacarpophalangeal amputation of unspecified finger, initial encounter: Secondary | ICD-10-CM

## 2017-07-31 DIAGNOSIS — Z79899 Other long term (current) drug therapy: Secondary | ICD-10-CM | POA: Diagnosis not present

## 2017-07-31 DIAGNOSIS — Y929 Unspecified place or not applicable: Secondary | ICD-10-CM | POA: Diagnosis not present

## 2017-07-31 DIAGNOSIS — Y999 Unspecified external cause status: Secondary | ICD-10-CM | POA: Insufficient documentation

## 2017-07-31 DIAGNOSIS — S61213A Laceration without foreign body of left middle finger without damage to nail, initial encounter: Secondary | ICD-10-CM | POA: Diagnosis not present

## 2017-07-31 DIAGNOSIS — S68123A Partial traumatic metacarpophalangeal amputation of left middle finger, initial encounter: Secondary | ICD-10-CM | POA: Insufficient documentation

## 2017-07-31 DIAGNOSIS — W268XXA Contact with other sharp object(s), not elsewhere classified, initial encounter: Secondary | ICD-10-CM | POA: Insufficient documentation

## 2017-07-31 DIAGNOSIS — S68119A Complete traumatic metacarpophalangeal amputation of unspecified finger, initial encounter: Secondary | ICD-10-CM

## 2017-07-31 DIAGNOSIS — Y93G1 Activity, food preparation and clean up: Secondary | ICD-10-CM | POA: Diagnosis not present

## 2017-07-31 MED ORDER — LIDOCAINE-EPINEPHRINE-TETRACAINE (LET) SOLUTION
3.0000 mL | Freq: Once | NASAL | Status: AC
  Administered 2017-07-31: 3 mL via TOPICAL
  Filled 2017-07-31: qty 3

## 2017-07-31 MED ORDER — TETANUS-DIPHTH-ACELL PERTUSSIS 5-2.5-18.5 LF-MCG/0.5 IM SUSP
0.5000 mL | Freq: Once | INTRAMUSCULAR | Status: AC
  Administered 2017-07-31: 0.5 mL via INTRAMUSCULAR
  Filled 2017-07-31: qty 0.5

## 2017-07-31 NOTE — ED Provider Notes (Signed)
[facility] Emergency Department Provider Note ____________________________________________  Time seen: 56  I have reviewed the triage vital signs and the nursing notes.  HISTORY  Chief Complaint  Laceration  HPI Rose Bernard is a 51 y.o. female resents to the ED for evaluation of a laceration to the distal tip of her left third finger. The patient was using a mandolin at home slice tomatoes, when she accidentally sliced the ulnar aspect of her left middle finger. She reports that she is unclear for current tetanus status. Bleeding is currently controlled.  Past Medical History:  Diagnosis Date  . Chronic cystitis   . Frequent UTI   . Incomplete bladder emptying   . 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 Sinusitis, acute maxillary 03/21/2016  . Chest tightness 02/09/2016  . Hot flashes 02/09/2016  . Left groin pain 12/23/2015  . Loss of weight 10/02/2015  . Myofascial pain 09/16/2015  . Spasm 09/16/2015  . Chronic female pelvic pain 09/16/2015  . Abdominal pain, left lower quadrant 09/07/2015  . Abnormal mammogram 04/12/2015  . Health care maintenance 04/12/2015  . Sore throat 10/05/2014  . Flank pain 08/21/2014  . Sacral pain 12/29/2013  . Sinus congestion 12/29/2013  . Rib pain 09/29/2013  . Urinary frequency 05/15/2013  . Urge incontinence of urine 05/15/2013  . UTI (urinary tract infection) 02/27/2013  . Family history of colon cancer 02/20/2013  . Headache 02/11/2013  . Chronic cystitis 02/11/2013  . History of ovarian cyst 02/11/2013  . Reflux, vesicoureteral 10/15/2012  . Symptoms involving urinary system 10/15/2012  . Mixed incontinence 10/15/2012  . Incomplete bladder emptying 10/15/2012  . Disorder of bladder function 10/15/2012    Past Surgical History:  Procedure Laterality Date  . ABDOMINAL HYSTERECTOMY  1995   2/2 delivery complications with 3rd  child. Hysterectomy and bladder repair with subsequent appendectomy. L Ovary remain in place   . APPENDECTOMY     2001 due to scar tissue  . BLADDER BOTOX  2014, 2015, 2016  . BREAST BIOPSY Bilateral 2001   neg  . BREAST SURGERY Bilateral 2002   benign mass removed  . COLONOSCOPY  2014  . FOOT SURGERY Bilateral   . LAPAROSCOPIC HYSTERECTOMY  1995  . reconstruction of bladder   1995    2/2 delivery complications with 3rd child     Prior to Admission medications   Medication Sig Start Date End Date Taking? Authorizing Provider  venlafaxine XR (EFFEXOR XR) 75 MG 24 hr capsule Take 1 capsule (75 mg total) by mouth daily with breakfast. 06/09/17   Einar Pheasant, MD    Allergies Penicillin v potassium  Family History  Problem Relation Age of Onset  . Colon cancer Father        With ostomy in place   . Breast cancer Paternal Aunt 44    Social History Social History  Substance Use Topics  . Smoking status: Never Smoker  . Smokeless tobacco: Never Used  . Alcohol use No    Review of Systems  Constitutional: Negative for fever. Eyes: Negative for visual changes. ENT: Negative for sore throat. Cardiovascular: Negative for chest pain. Respiratory: Negative for shortness of breath. Gastrointestinal: Negative for abdominal pain, vomiting and diarrhea. Genitourinary: Negative for dysuria. Musculoskeletal: Negative for back pain. Skin: Negative for rash. Neurological: Negative for headaches, focal weakness or numbness. ____________________________________________  PHYSICAL EXAM:  VITAL SIGNS: ED Triage Vitals  Enc Vitals  Group     BP 07/31/17 1255 124/84     Pulse Rate 07/31/17 1255 63     Resp 07/31/17 1255 18     Temp 07/31/17 1255 98 F (36.7 C)     Temp src --      SpO2 07/31/17 1255 100 %     Weight 07/31/17 1256 130 lb (59 kg)     Height --      Head Circumference --      Peak Flow --      Pain Score 07/31/17 1254 8     Pain Loc --      Pain Edu? --       Excl. in [place]? --     Constitutional: Alert and oriented. Well appearing and in no distress. Head: Normocephalic and atraumatic. Cardiovascular: Normal distal pulses. Musculoskeletal: composite fist. Left middle finger with a 1 cm lateral distal fingertip partial amputation. The lateral wound flap includes the lateral-most portion of the distal finger nail.  Nontender with normal range of motion in all extremities.  Neurologic:  Normal gross sensation.Normal intrinsic and opposition testing. Normal speech and language. No gross focal neurologic deficits are appreciated. Skin:  Skin is warm, dry and intact. No rash noted. ____________________________________________  PROCEDURES  Tdap 0.5 ml IM  LACERATION REPAIR Performed by: Melvenia Needles Authorized by: Melvenia Needles Consent: Verbal consent obtained. Risks and benefits: risks, benefits and alternatives were discussed Consent given by: patient Patient identity confirmed: provided demographic data Prepped and Draped in normal sterile fashion Wound explored  Laceration Location: left middle finger  Laceration Length: 1 cm  No Foreign Bodies seen or palpated. Amputated tip is removed after normal sterile prep. The wound bed is exsanguinated and dried as well as possible. Wound bed covered with wound adhesive.  Anesthesia: topical infiltration  Local anesthetic: lidocaine-epinephrine-tetracaine  Anesthetic total: 3 ml  Irrigation method: syringe Amount of cleaning: standard  Skin closure: Dermabond wound adhesive  Patient tolerance: Patient tolerated the procedure well with no immediate complications. ____________________________________________  INITIAL IMPRESSION / ASSESSMENT AND PLAN / ED COURSE  Patient with ED evaluation management of a distal left fingertip amputation. The patient's left middle finger is cleaned and dressed and after the nonviable fingertip is removed, the wound bed is prepped and  repaired using adhesive. Patient tolerates procedure well. Dry sterile dressing is applied. She will follow up primary care provider for ongoing symptom management. ____________________________________________  FINAL CLINICAL IMPRESSION(S) / ED DIAGNOSES  Final diagnoses:  Laceration of left middle finger without foreign body without damage to nail, initial encounter  Fingertip amputation, initial encounter      Melvenia Needles, PA-C 07/31/17 1721    Merlyn Lot, MD 07/31/17 2126

## 2017-07-31 NOTE — ED Triage Notes (Signed)
Patient lacerated the tip of the left 3rd finger through the nail on a mandolin while slicing tomatoes.

## 2017-07-31 NOTE — Discharge Instructions (Signed)
You have been treated for a fingertip amputation. Keep the wound clean, dry, and covered. Follow-up with your provider or Dr. Jerilee Hoh.

## 2017-07-31 NOTE — ED Notes (Signed)
See triage note  States she was cutting tomatoes with a mandolin  states she cut her left middle digit

## 2017-07-31 NOTE — ED Notes (Signed)
Pt alert and oriented X4, active, cooperative, pt in NAD. RR even and unlabored, color WNL.  Pt informed to return if any life threatening symptoms occur.   

## 2017-08-01 ENCOUNTER — Ambulatory Visit: Payer: BLUE CROSS/BLUE SHIELD | Admitting: Internal Medicine

## 2017-08-02 ENCOUNTER — Ambulatory Visit (INDEPENDENT_AMBULATORY_CARE_PROVIDER_SITE_OTHER): Payer: BLUE CROSS/BLUE SHIELD | Admitting: Internal Medicine

## 2017-08-02 ENCOUNTER — Encounter: Payer: Self-pay | Admitting: Internal Medicine

## 2017-08-02 VITALS — BP 126/62 | HR 73 | Temp 98.6°F | Resp 12 | Wt 131.2 lb

## 2017-08-02 DIAGNOSIS — R232 Flushing: Secondary | ICD-10-CM

## 2017-08-02 DIAGNOSIS — R51 Headache: Secondary | ICD-10-CM

## 2017-08-02 DIAGNOSIS — Z8742 Personal history of other diseases of the female genital tract: Secondary | ICD-10-CM | POA: Diagnosis not present

## 2017-08-02 DIAGNOSIS — R0989 Other specified symptoms and signs involving the circulatory and respiratory systems: Secondary | ICD-10-CM | POA: Diagnosis not present

## 2017-08-02 DIAGNOSIS — R519 Headache, unspecified: Secondary | ICD-10-CM

## 2017-08-02 DIAGNOSIS — R339 Retention of urine, unspecified: Secondary | ICD-10-CM

## 2017-08-02 LAB — CBC WITH DIFFERENTIAL/PLATELET
BASOS ABS: 0.1 10*3/uL (ref 0.0–0.1)
BASOS PCT: 1.1 % (ref 0.0–3.0)
EOS ABS: 0.1 10*3/uL (ref 0.0–0.7)
Eosinophils Relative: 2.3 % (ref 0.0–5.0)
HCT: 38.4 % (ref 36.0–46.0)
Hemoglobin: 12.7 g/dL (ref 12.0–15.0)
LYMPHS ABS: 1.6 10*3/uL (ref 0.7–4.0)
Lymphocytes Relative: 30.5 % (ref 12.0–46.0)
MCHC: 33.1 g/dL (ref 30.0–36.0)
MCV: 93.3 fl (ref 78.0–100.0)
Monocytes Absolute: 0.6 10*3/uL (ref 0.1–1.0)
Monocytes Relative: 10.5 % (ref 3.0–12.0)
NEUTROS ABS: 3 10*3/uL (ref 1.4–7.7)
NEUTROS PCT: 55.6 % (ref 43.0–77.0)
PLATELETS: 259 10*3/uL (ref 150.0–400.0)
RBC: 4.12 Mil/uL (ref 3.87–5.11)
RDW: 13.4 % (ref 11.5–15.5)
WBC: 5.4 10*3/uL (ref 4.0–10.5)

## 2017-08-02 LAB — TSH: TSH: 1.62 u[IU]/mL (ref 0.35–4.50)

## 2017-08-02 LAB — COMPREHENSIVE METABOLIC PANEL
ALT: 11 U/L (ref 0–35)
AST: 11 U/L (ref 0–37)
Albumin: 4.1 g/dL (ref 3.5–5.2)
Alkaline Phosphatase: 68 U/L (ref 39–117)
BILIRUBIN TOTAL: 0.4 mg/dL (ref 0.2–1.2)
BUN: 19 mg/dL (ref 6–23)
CALCIUM: 9 mg/dL (ref 8.4–10.5)
CHLORIDE: 107 meq/L (ref 96–112)
CO2: 31 meq/L (ref 19–32)
CREATININE: 0.81 mg/dL (ref 0.40–1.20)
GFR: 79.06 mL/min (ref >60.00)
GLUCOSE: 83 mg/dL (ref 70–99)
Potassium: 3.8 mEq/L (ref 3.5–5.1)
Sodium: 143 mEq/L (ref 135–145)
Total Protein: 6.9 g/dL (ref 6.0–8.3)

## 2017-08-02 LAB — SEDIMENTATION RATE: Sed Rate: 9 mm/hr (ref 0–30)

## 2017-08-02 NOTE — Progress Notes (Signed)
Patient ID: Rose Bernard, female   DOB: [birth year], 51 y.o.   MRN: [id number]   Subjective:    Patient ID: Rose Bernard, female    DOB: 1966/07/03, 51 y.o.   MRN: [id number]  HPI  Patient here for a scheduled follow up.  She was seen in the ER 07/31/17 for finger tip laceration.  Note reviewed.  Able to bend and move her finger.  No evidence of infection.  Received tetanus.  Still having headaches.  Describes - posterior in location and states moves to sinuses.  No nasal congestion.  No fever.  No drainage.  Has some neck pain.  States headaches can start in her neck.  No chest pain.  No sob.  No acid reflux.  She is still having hot flashes. Did not tolerate effexor.  Discussed hormones.  She wants to hold on taking.  Discussed SSRI.  She has appt scheduled next week with gyn.  Wants to discuss with them.     Past Medical History:  Diagnosis Date  . Chronic cystitis   . Frequent UTI   . Incomplete bladder emptying   . 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 ABDOMINAL HYSTERECTOMY  1995   2/2 delivery complications with 3rd child. Hysterectomy and bladder repair with subsequent appendectomy. L Ovary remain in place   . APPENDECTOMY     2001 due to scar tissue  . BLADDER BOTOX  2014, 2015, 2016  . BREAST BIOPSY Bilateral 2001   neg  . BREAST SURGERY Bilateral 2002   benign mass removed  . COLONOSCOPY  2014  . FOOT SURGERY Bilateral   . LAPAROSCOPIC HYSTERECTOMY  1995  . reconstruction of bladder   1995    2/2 delivery complications with 3rd child    Family History  Problem Relation Age of Onset  . Colon cancer Father        With ostomy in place   . Breast cancer Paternal Aunt 21   Social History   Social History  . Marital status: Married    Spouse name: N/A  . Number of children: 2  . Years of education: N/A   Social History Main Topics  . Smoking status: Never Smoker  . Smokeless tobacco: Never  Used  . Alcohol use No  . Drug use: No  . Sexual activity: Yes    Birth control/ protection: Surgical   Other Topics Concern  . None   Social History Narrative  . None    Outpatient Encounter Prescriptions as of 08/02/2017  Medication Sig  . [DISCONTINUED] venlafaxine XR (EFFEXOR XR) 75 MG 24 hr capsule Take 1 capsule (75 mg total) by mouth daily with breakfast.   No facility-administered encounter medications on file as of 08/02/2017.     Review of Systems  Constitutional: Negative for appetite change and unexpected weight change.  HENT: Negative for congestion, postnasal drip and sinus pressure.   Respiratory: Negative for cough, chest tightness and shortness of breath.   Cardiovascular: Negative for chest pain, palpitations and leg swelling.  Gastrointestinal: Negative for diarrhea, nausea and vomiting.  Genitourinary: Negative for difficulty urinating and vaginal discharge.  Musculoskeletal: Negative for joint swelling and myalgias.  Skin: Negative for color change and rash.  Neurological: Positive for headaches. Negative for dizziness and light-headedness.  Psychiatric/Behavioral: Negative for agitation and dysphoric mood.       Objective:    Physical Exam  Constitutional: She appears well-developed and  well-nourished. No distress.  HENT:  Nose: Nose normal.  Mouth/Throat: Oropharynx is clear and moist.  Neck: Neck supple. No thyromegaly present.  Cardiovascular: Normal rate and regular rhythm.   Left carotid bruit.    Pulmonary/Chest: Breath sounds normal. No respiratory distress. She has no wheezes.  Abdominal: Soft. Bowel sounds are normal. There is no tenderness.  Musculoskeletal: She exhibits no edema or tenderness.  Lymphadenopathy:    She has no cervical adenopathy.  Skin: No rash noted. No erythema.  Psychiatric: She has a normal mood and affect. Her behavior is normal.    BP 126/62 (BP Location: Left Arm, Patient Position: Sitting, Cuff Size: Normal)    Pulse 73   Temp 98.6 F (37 C) (Oral)   Resp 12   Wt 131 lb 3.2 oz (59.5 kg)   LMP 02/07/1994   SpO2 98%   BMI 21.18 kg/m  Wt Readings from Last 3 Encounters:  08/02/17 131 lb 3.2 oz (59.5 kg)  07/31/17 130 lb (59 kg)  06/08/17 130 lb 3.2 oz (59.1 kg)     Lab Results  Component Value Date   WBC 5.4 08/02/2017   HGB 12.7 08/02/2017   HCT 38.4 08/02/2017   PLT 259.0 08/02/2017   GLUCOSE 83 08/02/2017   CHOL 180 10/16/2014   TRIG 46.0 10/16/2014   HDL 55.80 10/16/2014   LDLCALC 115 (H) 10/16/2014   ALT 11 08/02/2017   AST 11 08/02/2017   NA 143 08/02/2017   K 3.8 08/02/2017   CL 107 08/02/2017   CREATININE 0.81 08/02/2017   BUN 19 08/02/2017   CO2 31 08/02/2017   TSH 1.62 08/02/2017   HGBA1C 5.6 10/01/2015       Assessment & Plan:   Problem List Items Addressed This Visit    Headache - Primary    Persistent intermittent headaches.  Neck and extends up.  Will notice in sinus region as well.  Discussed further w/up including c-spine xray, head scan, etc.  She declines.  Check esr.  Discussed the need to determine etiology.  She declines.  Wants to follow.  Did encourage to have eyes checked.        Relevant Orders   TSH (Completed)   CBC with Differential/Platelet (Completed)   Comprehensive metabolic panel (Completed)   Sedimentation rate (Completed)   History of ovarian cyst    Has ovarian cyst.  Has seen gyn previously.  Refer to Dr Cope's note.  Had CT.  Felt to be smaller.  Will need to be followed.        Hot flashes    Persistent.  Intolerant to effexor.  Discussed hormone therapy.  She wants to hold at this time.  Consider SSRI.  She has appt with gyn next week.  Wants to discuss with them regarding further treatment.        Incomplete bladder emptying    Followed by Dr Jacqlyn Larsen.        Left carotid bruit    Scheduled carotid ultrasound.       Relevant Orders   VAS US CAROTID       Einar Pheasant, MD

## 2017-08-03 ENCOUNTER — Encounter: Payer: Self-pay | Admitting: Internal Medicine

## 2017-08-03 ENCOUNTER — Ambulatory Visit (INDEPENDENT_AMBULATORY_CARE_PROVIDER_SITE_OTHER): Payer: BLUE CROSS/BLUE SHIELD

## 2017-08-03 DIAGNOSIS — R0989 Other specified symptoms and signs involving the circulatory and respiratory systems: Secondary | ICD-10-CM

## 2017-08-03 NOTE — Assessment & Plan Note (Signed)
Persistent intermittent headaches.  Neck and extends up.  Will notice in sinus region as well.  Discussed further w/up including c-spine xray, head scan, etc.  She declines.  Check esr.  Discussed the need to determine etiology.  She declines.  Wants to follow.  Did encourage to have eyes checked.

## 2017-08-03 NOTE — Assessment & Plan Note (Signed)
Scheduled carotid ultrasound.

## 2017-08-03 NOTE — Assessment & Plan Note (Signed)
Has ovarian cyst.  Has seen gyn previously.  Refer to Dr Cope's note.  Had CT.  Felt to be smaller.  Will need to be followed.

## 2017-08-03 NOTE — Assessment & Plan Note (Signed)
Followed by Dr Cope.  

## 2017-08-03 NOTE — Assessment & Plan Note (Signed)
Persistent.  Intolerant to effexor.  Discussed hormone therapy.  She wants to hold at this time.  Consider SSRI.  She has appt with gyn next week.  Wants to discuss with them regarding further treatment.

## 2017-08-09 DIAGNOSIS — N951 Menopausal and female climacteric states: Secondary | ICD-10-CM | POA: Diagnosis not present

## 2017-08-11 ENCOUNTER — Telehealth: Payer: Self-pay | Admitting: *Deleted

## 2017-08-11 NOTE — Telephone Encounter (Signed)
Patient informed no questions at this time.   

## 2017-08-11 NOTE — Telephone Encounter (Signed)
Patient requested imaging results if available  Pt contact [phone]

## 2017-08-11 NOTE — Telephone Encounter (Signed)
Notify pt that carotid ultrasound ok.  No blockage.

## 2017-08-11 NOTE — Telephone Encounter (Signed)
Pt calling about results from u/s she had done. Report in chart. Informed that I will call her with information after reviewed.

## 2017-08-12 DIAGNOSIS — J029 Acute pharyngitis, unspecified: Secondary | ICD-10-CM | POA: Diagnosis not present

## 2017-08-12 DIAGNOSIS — R509 Fever, unspecified: Secondary | ICD-10-CM | POA: Diagnosis not present

## 2017-08-12 DIAGNOSIS — J020 Streptococcal pharyngitis: Secondary | ICD-10-CM | POA: Diagnosis not present

## 2017-09-05 DIAGNOSIS — N1370 Vesicoureteral-reflux, unspecified: Secondary | ICD-10-CM | POA: Diagnosis not present

## 2017-09-05 DIAGNOSIS — N3020 Other chronic cystitis without hematuria: Secondary | ICD-10-CM | POA: Diagnosis not present

## 2017-09-05 DIAGNOSIS — N3281 Overactive bladder: Secondary | ICD-10-CM | POA: Diagnosis not present

## 2017-09-05 DIAGNOSIS — R339 Retention of urine, unspecified: Secondary | ICD-10-CM | POA: Diagnosis not present

## 2017-09-06 DIAGNOSIS — Z7189 Other specified counseling: Secondary | ICD-10-CM | POA: Diagnosis not present

## 2017-10-06 ENCOUNTER — Ambulatory Visit (INDEPENDENT_AMBULATORY_CARE_PROVIDER_SITE_OTHER): Payer: BLUE CROSS/BLUE SHIELD | Admitting: Internal Medicine

## 2017-10-06 ENCOUNTER — Encounter: Payer: Self-pay | Admitting: Internal Medicine

## 2017-10-06 VITALS — BP 124/62 | HR 63 | Temp 98.6°F | Resp 14 | Ht 66.0 in | Wt 134.0 lb

## 2017-10-06 DIAGNOSIS — K120 Recurrent oral aphthae: Secondary | ICD-10-CM | POA: Diagnosis not present

## 2017-10-06 DIAGNOSIS — R232 Flushing: Secondary | ICD-10-CM | POA: Diagnosis not present

## 2017-10-06 DIAGNOSIS — N319 Neuromuscular dysfunction of bladder, unspecified: Secondary | ICD-10-CM | POA: Diagnosis not present

## 2017-10-06 DIAGNOSIS — R339 Retention of urine, unspecified: Secondary | ICD-10-CM

## 2017-10-06 MED ORDER — FIRST-DUKES MOUTHWASH MT SUSP: OROMUCOSAL | 0 refills | Status: DC

## 2017-10-06 NOTE — Progress Notes (Signed)
Patient ID: Rose Bernard, female   DOB: Jul 25, 1966, 51 y.o.   MRN: [id number]   Subjective:    Patient ID: Rose Bernard, female    DOB: 1966/01/01, 51 y.o.   MRN: [id number]  HPI  Patient here for a scheduled follow up.  She saw gyn - Dr Leafy Ro.  Using climara patch.  Better.  Just increased dose.  Tolerating.  Feels better.  Handling stress.  Stays active.  Breathing stable.  Does have ulcer under tongue.  Persistent.  Present for one week.  No acid reflux.  No abdominal pain.  Bowels stable.  Due 11/2017 - next botox.     Past Medical History:  Diagnosis Date  . Chronic cystitis   . Frequent UTI   . Incomplete bladder emptying   . 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 ABDOMINAL HYSTERECTOMY  1995   2/2 delivery complications with 3rd child. Hysterectomy and bladder repair with subsequent appendectomy. L Ovary remain in place   . APPENDECTOMY     2001 due to scar tissue  . BLADDER BOTOX  2014, 2015, 2016  . BREAST BIOPSY Bilateral 2001   neg  . BREAST SURGERY Bilateral 2002   benign mass removed  . COLONOSCOPY  2014  . FOOT SURGERY Bilateral   . LAPAROSCOPIC HYSTERECTOMY  1995  . reconstruction of bladder   1995    2/2 delivery complications with 3rd child    Family History  Problem Relation Age of Onset  . Colon cancer Father        With ostomy in place   . Breast cancer Paternal Aunt 22   Social History   Social History  . Marital status: Married    Spouse name: N/A  . Number of children: 2  . Years of education: N/A   Social History Main Topics  . Smoking status: Never Smoker  . Smokeless tobacco: Never Used  . Alcohol use No  . Drug use: No  . Sexual activity: Yes    Birth control/ protection: Surgical   Other Topics Concern  . None   Social History Narrative  . None    Outpatient Encounter Prescriptions as of 10/06/2017  Medication Sig  . estradiol (CLIMARA - DOSED IN MG/24  HR) 0.05 mg/24hr patch 1 patch once a week.  . Diphenhyd-Hydrocort-Nystatin (FIRST-DUKES MOUTHWASH) SUSP 5cc's tid swish and spit.   No facility-administered encounter medications on file as of 10/06/2017.     Review of Systems  Constitutional: Negative for appetite change and unexpected weight change.  HENT: Negative for congestion and sinus pressure.   Respiratory: Negative for cough, chest tightness and shortness of breath.   Cardiovascular: Negative for chest pain, palpitations and leg swelling.  Gastrointestinal: Negative for abdominal pain, diarrhea, nausea and vomiting.  Genitourinary: Negative for difficulty urinating and dysuria.  Musculoskeletal: Negative for joint swelling and myalgias.  Skin: Negative for color change and rash.  Neurological: Negative for dizziness, light-headedness and headaches.  Psychiatric/Behavioral: Negative for agitation and dysphoric mood.       Objective:     Blood pressure rechecked by me:  120/72  Physical Exam  Constitutional: She appears well-developed and well-nourished. No distress.  HENT:  Nose: Nose normal.  Mouth/Throat: Oropharynx is clear and moist.  Neck: Neck supple. No thyromegaly present.  Cardiovascular: Normal rate and regular rhythm.   Pulmonary/Chest: Breath sounds normal. No respiratory distress. She has no wheezes.  Abdominal: Soft. Bowel  sounds are normal. There is no tenderness.  Musculoskeletal: She exhibits no edema or tenderness.  Lymphadenopathy:    She has no cervical adenopathy.  Skin: No rash noted. No erythema.  Psychiatric: She has a normal mood and affect. Her behavior is normal.    BP 124/62 (BP Location: Left Arm, Patient Position: Sitting, Cuff Size: Normal)   Pulse 63   Temp 98.6 F (37 C) (Oral)   Resp 14   Ht 5\' 6"  (1.676 m)   Wt 134 lb (60.8 kg)   LMP 02/07/1994   SpO2 98%   BMI 21.63 kg/m  Wt Readings from Last 3 Encounters:  10/06/17 134 lb (60.8 kg)  08/02/17 131 lb 3.2 oz (59.5 kg)    07/31/17 130 lb (59 kg)     Lab Results  Component Value Date   WBC 5.4 08/02/2017   HGB 12.7 08/02/2017   HCT 38.4 08/02/2017   PLT 259.0 08/02/2017   GLUCOSE 83 08/02/2017   CHOL 180 10/16/2014   TRIG 46.0 10/16/2014   HDL 55.80 10/16/2014   LDLCALC 115 (H) 10/16/2014   ALT 11 08/02/2017   AST 11 08/02/2017   NA 143 08/02/2017   K 3.8 08/02/2017   CL 107 08/02/2017   CREATININE 0.81 08/02/2017   BUN 19 08/02/2017   CO2 31 08/02/2017   TSH 1.62 08/02/2017   HGBA1C 5.6 10/01/2015       Assessment & Plan:   Problem List Items Addressed This Visit    Disorder of bladder function    Followed by Dr Jacqlyn Larsen.  Next botox injection 11/2017.  Stable.        Hot flashes    Better on climara.  Seeing gyn.        Incomplete bladder emptying    Followed by Dr Jacqlyn Larsen.         Other Visit Diagnoses    Aphthous ulcer    -  Primary   ulcer.  Dukes mouthwash.         Einar Pheasant, MD

## 2017-10-08 ENCOUNTER — Encounter: Payer: Self-pay | Admitting: Internal Medicine

## 2017-10-08 NOTE — Assessment & Plan Note (Signed)
Followed by Dr Cope.  

## 2017-10-08 NOTE — Assessment & Plan Note (Signed)
Better on climara.  Seeing gyn.

## 2017-10-08 NOTE — Assessment & Plan Note (Signed)
Followed by Dr Jacqlyn Larsen.  Next botox injection 11/2017.  Stable.

## 2017-10-09 ENCOUNTER — Other Ambulatory Visit: Payer: Self-pay

## 2017-10-09 ENCOUNTER — Telehealth: Payer: Self-pay | Admitting: Internal Medicine

## 2017-10-09 MED ORDER — FIRST-DUKES MOUTHWASH MT SUSP: OROMUCOSAL | 0 refills | Status: DC

## 2017-10-09 NOTE — Telephone Encounter (Signed)
Can call pharmacy and see if they have recs for cheaper med then Dukes.

## 2017-10-09 NOTE — Telephone Encounter (Signed)
Called pharmacy they do not have script to give me price to see if their was another option. Asked that I resend script and they will call me with options. I have sent to them and soul get call back today.

## 2017-10-09 NOTE — Telephone Encounter (Signed)
Is their a generic for this?

## 2017-10-09 NOTE — Telephone Encounter (Signed)
Request received placed in your blue folder.

## 2017-10-09 NOTE — Telephone Encounter (Signed)
Pt need the generic form of Diphenhyd-Hydrocort-Nystatin (FIRST-DUKES MOUTHWASH) called in to [facility]

## 2017-10-10 NOTE — Telephone Encounter (Signed)
Called pharmacy and let them know patient has been informed.

## 2017-10-10 NOTE — Telephone Encounter (Signed)
Ok'd.  Placed in box.

## 2017-10-21 DIAGNOSIS — N9489 Other specified conditions associated with female genital organs and menstrual cycle: Secondary | ICD-10-CM | POA: Diagnosis not present

## 2017-10-21 DIAGNOSIS — Z6820 Body mass index (BMI) 20.0-20.9, adult: Secondary | ICD-10-CM | POA: Diagnosis not present

## 2017-10-21 DIAGNOSIS — N200 Calculus of kidney: Secondary | ICD-10-CM | POA: Diagnosis not present

## 2017-10-21 DIAGNOSIS — N281 Cyst of kidney, acquired: Secondary | ICD-10-CM | POA: Diagnosis not present

## 2017-10-21 DIAGNOSIS — R310 Gross hematuria: Secondary | ICD-10-CM | POA: Diagnosis not present

## 2017-10-21 DIAGNOSIS — Z90710 Acquired absence of both cervix and uterus: Secondary | ICD-10-CM | POA: Diagnosis not present

## 2017-10-21 DIAGNOSIS — N3021 Other chronic cystitis with hematuria: Secondary | ICD-10-CM | POA: Diagnosis not present

## 2017-10-21 DIAGNOSIS — N3281 Overactive bladder: Secondary | ICD-10-CM | POA: Diagnosis not present

## 2017-10-21 DIAGNOSIS — M545 Low back pain: Secondary | ICD-10-CM | POA: Diagnosis not present

## 2017-10-21 DIAGNOSIS — N1370 Vesicoureteral-reflux, unspecified: Secondary | ICD-10-CM | POA: Diagnosis not present

## 2017-10-24 DIAGNOSIS — R310 Gross hematuria: Secondary | ICD-10-CM | POA: Diagnosis not present

## 2017-10-24 DIAGNOSIS — N200 Calculus of kidney: Secondary | ICD-10-CM | POA: Diagnosis not present

## 2017-10-24 DIAGNOSIS — R339 Retention of urine, unspecified: Secondary | ICD-10-CM | POA: Diagnosis not present

## 2017-10-24 DIAGNOSIS — N23 Unspecified renal colic: Secondary | ICD-10-CM | POA: Insufficient documentation

## 2017-10-24 DIAGNOSIS — N3020 Other chronic cystitis without hematuria: Secondary | ICD-10-CM | POA: Diagnosis not present

## 2017-11-10 DIAGNOSIS — R350 Frequency of micturition: Secondary | ICD-10-CM | POA: Diagnosis not present

## 2017-11-17 ENCOUNTER — Other Ambulatory Visit: Payer: Self-pay

## 2017-11-17 ENCOUNTER — Encounter: Payer: Self-pay | Admitting: Emergency Medicine

## 2017-11-17 ENCOUNTER — Ambulatory Visit: Payer: Self-pay | Admitting: Hematology

## 2017-11-17 ENCOUNTER — Emergency Department
Admission: EM | Admit: 2017-11-17 | Discharge: 2017-11-17 | Disposition: A | Discharge status: Home / Self Care | Payer: BLUE CROSS/BLUE SHIELD | Attending: Emergency Medicine | Admitting: Emergency Medicine

## 2017-11-17 DIAGNOSIS — M79601 Pain in right arm: Secondary | ICD-10-CM | POA: Diagnosis not present

## 2017-11-17 DIAGNOSIS — R51 Headache: Secondary | ICD-10-CM | POA: Insufficient documentation

## 2017-11-17 DIAGNOSIS — R112 Nausea with vomiting, unspecified: Secondary | ICD-10-CM | POA: Diagnosis not present

## 2017-11-17 DIAGNOSIS — R291 Meningismus: Secondary | ICD-10-CM | POA: Diagnosis not present

## 2017-11-17 DIAGNOSIS — R509 Fever, unspecified: Secondary | ICD-10-CM | POA: Diagnosis not present

## 2017-11-17 DIAGNOSIS — R519 Headache, unspecified: Secondary | ICD-10-CM

## 2017-11-17 MED ORDER — KETOROLAC TROMETHAMINE 30 MG/ML IJ SOLN
30.0000 mg | Freq: Once | INTRAMUSCULAR | Status: AC
  Administered 2017-11-17: 30 mg via INTRAVENOUS
  Filled 2017-11-17: qty 1

## 2017-11-17 MED ORDER — DEXAMETHASONE SODIUM PHOSPHATE 10 MG/ML IJ SOLN
10.0000 mg | Freq: Once | INTRAMUSCULAR | Status: AC
  Administered 2017-11-17: 10 mg via INTRAVENOUS
  Filled 2017-11-17: qty 1

## 2017-11-17 MED ORDER — SODIUM CHLORIDE 0.9 % IV BOLUS (SEPSIS)
1000.0000 mL | Freq: Once | INTRAVENOUS | Status: AC
  Administered 2017-11-17: 1000 mL via INTRAVENOUS

## 2017-11-17 MED ORDER — METOCLOPRAMIDE HCL 5 MG/ML IJ SOLN
10.0000 mg | Freq: Once | INTRAMUSCULAR | Status: AC
  Administered 2017-11-17: 10 mg via INTRAVENOUS
  Filled 2017-11-17: qty 2

## 2017-11-17 MED ORDER — DIPHENHYDRAMINE HCL 50 MG/ML IJ SOLN
25.0000 mg | Freq: Once | INTRAMUSCULAR | Status: AC
  Administered 2017-11-17: 25 mg via INTRAVENOUS
  Filled 2017-11-17: qty 1

## 2017-11-17 NOTE — ED Triage Notes (Signed)
Pt to ED from [facility] in for evaluation for neck pain and stiffness, headache, rash on right arm, light sensitivity, and nausea. Pt states that the neck pain started on Monday and the headache started yesterday. Pt is in NAD at this time.

## 2017-11-17 NOTE — ED Provider Notes (Signed)
[facility] Emergency Department Provider Note ____________________________________________   I have reviewed the triage vital signs and the triage nursing note.  HISTORY  Chief Complaint Neck Pain and Headache   Historian Patient. With husband and sister at bedside  HPI Rose Bernard is a 51 y.o. female presents to the ED after being seen at urgent care today for headache and then referred to the ED for further evaluation and investigation of headache and concern for the possibility of meningitis.  Patient states that she went to urgent care because of headache that started yesterday, gradual onset, it felt like similar prior "migraines "although lasting longer.  She states that she is also having some soreness down into her neck.  No slurred speech or altered mental status, focal weakness or numbness.  She does have photophobia.  She was treated for kidney stone and states that she was also on doxycycline but completed antibiotics about 4 weeks ago.  Apparently she had a low-grade temperature around 100.  No vision problems.   Past Medical History:  Diagnosis Date  . Chronic cystitis   . Frequent UTI   . Incomplete bladder emptying   . 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 Left carotid bruit 08/02/2017  . Sinusitis, acute maxillary 03/21/2016  . Chest tightness 02/09/2016  . Hot flashes 02/09/2016  . Left groin pain 12/23/2015  . Loss of weight 10/02/2015  . Myofascial pain 09/16/2015  . Spasm 09/16/2015  . Chronic female pelvic pain 09/16/2015  . Abdominal pain, left lower quadrant 09/07/2015  . Abnormal mammogram 04/12/2015  . Health care maintenance 04/12/2015  . Sore throat 10/05/2014  . Flank pain 08/21/2014  . Sacral pain 12/29/2013  . Sinus congestion 12/29/2013  . Rib pain 09/29/2013  . Urinary frequency 05/15/2013  . Urge incontinence of urine 05/15/2013   . UTI (urinary tract infection) 02/27/2013  . Family history of colon cancer 02/20/2013  . Headache 02/11/2013  . Chronic cystitis 02/11/2013  . History of ovarian cyst 02/11/2013  . Reflux, vesicoureteral 10/15/2012  . Symptoms involving urinary system 10/15/2012  . Mixed incontinence 10/15/2012  . Incomplete bladder emptying 10/15/2012  . Disorder of bladder function 10/15/2012    Past Surgical History:  Procedure Laterality Date  . ABDOMINAL HYSTERECTOMY  1995   2/2 delivery complications with 3rd child. Hysterectomy and bladder repair with subsequent appendectomy. L Ovary remain in place   . APPENDECTOMY     2001 due to scar tissue  . BLADDER BOTOX  2014, 2015, 2016  . BREAST BIOPSY Bilateral 2001   neg  . BREAST SURGERY Bilateral 2002   benign mass removed  . COLONOSCOPY  2014  . FOOT SURGERY Bilateral   . LAPAROSCOPIC HYSTERECTOMY  1995  . reconstruction of bladder   1995    2/2 delivery complications with 3rd child     Prior to Admission medications   Medication Sig Start Date End Date Taking? Authorizing Provider  Diphenhyd-Hydrocort-Nystatin (FIRST-DUKES MOUTHWASH) SUSP 5cc's tid swish and spit. 10/09/17   Einar Pheasant, MD  estradiol (CLIMARA - DOSED IN MG/24 HR) 0.05 mg/24hr patch 1 patch once a week. 08/09/17   [provider]    Allergies  Allergen Reactions  . Penicillin V Potassium     Family History  Problem Relation Age of Onset  . Colon cancer Father        With ostomy in place   .  Breast cancer Paternal Aunt 38    Social History Social History   Tobacco Use  . Smoking status: Never Smoker  . Smokeless tobacco: Never Used  Substance Use Topics  . Alcohol use: No    Alcohol/week: 0.0 oz  . Drug use: No    Review of Systems  Constitutional: Reported low-grade fever this week. Eyes: Negative for visual changes.  Positive for photophobia.  Fundoscopic exam normal bilaterally. ENT: Negative for sore throat. Cardiovascular:  Negative for chest pain. Respiratory: Negative for shortness of breath. Gastrointestinal: Negative for abdominal pain, vomiting and diarrhea. Genitourinary: Negative for dysuria. Musculoskeletal: Negative for back pain. Skin: Reports blistering to her right hand, 3 spots, improving since she used a hammer last week. Neurological: Positive for headache as per HPI.   ____________________________________________   PHYSICAL EXAM:  VITAL SIGNS: ED Triage Vitals  Enc Vitals Group     BP 11/17/17 1122 122/68     Pulse Rate 11/17/17 1122 82     Resp 11/17/17 1122 16     Temp 11/17/17 1122 98.6 F (37 C)     Temp Source 11/17/17 1122 Oral     SpO2 11/17/17 1122 100 %     Weight 11/17/17 1122 125 lb (56.7 kg)     Height 11/17/17 1122 5\' 7"  (1.702 m)     Head Circumference --      Peak Flow --      Pain Score 11/17/17 1120 10     Pain Loc --      Pain Edu? --      Excl. in [place]? --      Constitutional: Alert and oriented. Well appearing and in no distress.  Has the lights dim in a blanket over her eyes to help with the photophobia. HEENT   Head: Normocephalic and atraumatic.      Eyes: Conjunctivae are normal. Pupils equal and round and reactive to light.       Ears:         Nose: No congestion/rhinnorhea.   Mouth/Throat: Mucous membranes are moist.   Neck: No stridor.  Supple, not stiff, able to bring her chin to her chest. Cardiovascular/Chest: Normal rate, regular rhythm.  No murmurs, rubs, or gallops. Respiratory: Normal respiratory effort without tachypnea nor retractions. Breath sounds are clear and equal bilaterally. No wheezes/rales/rhonchi. Gastrointestinal: Soft. No distention, no guarding, no rebound. Nontender.    Genitourinary/rectal:Deferred Musculoskeletal: Nontender with normal range of motion in all extremities. No joint effusions.  No lower extremity tenderness.  No edema. Neurologic: No facial droop.  Cranial nerves II through X intact.  Photophobic.   Normal speech and language. No gross or focal neurologic deficits are appreciated.  5 out of 5 strength in 4 extremities.  No sensory changes. Skin:  Skin is warm, dry and intact. No rash noted. Psychiatric: Mood and affect are normal. Speech and behavior are normal. Patient exhibits appropriate insight and judgment.   ____________________________________________  LABS (pertinent positives/negatives) I, Lisa Roca, MD the attending physician have reviewed the labs noted below.  Labs Reviewed - No data to display  ____________________________________________    EKG I, Lisa Roca, MD, the attending physician have personally viewed and interpreted all ECGs.  None ____________________________________________  RADIOLOGY All Xrays were viewed by me.  Imaging interpreted by Radiologist, and I, Lisa Roca, MD the attending physician have reviewed the radiologist interpretation noted below.  None __________________________________________  PROCEDURES  Procedure(s) performed: None  Critical Care performed: None   ____________________________________________  ED  COURSE / ASSESSMENT AND PLAN  Pertinent labs & imaging results that were available during my care of the patient were reviewed by me and considered in my medical decision making (see chart for details).   Ms. Marrone was evaluated for complaint of headache.  I reviewed the note from [place] urgent care where patient was sent in out of concern for the possibility and evaluation for severe headache and low-grade temperature and the possibility for meningitis.  Patient herself states there was a gradual onset but persistent now for over 24 hours.  She states that she has a history of migraines and this headache feels similar to previous is just lasting longer.  She is not febrile here.  I reviewed her white blood cell count from earlier today white blood cell count is 6 and not elevated.  She is able to range her neck and  I am not appreciating neck stiffness.  I discussed with her that I do not have a high suspicion for bacterial meningitis.  She has not had been on current antibiotics in terms of a case of partially treated meningitis.  We discussed without focal neurologic findings, I do not have a high suspicious for intracranial emergency and I am recommending treatment of the headache as a nonspecific/migraine headache rather than imaging at this point time.  We discussed that I would not recommend LP for bacterial meningitis at this point in time.   Recheck around 245, patient's headache down from 10 down to 1 out of 10.  Was able to perform funduscopic exam which is reassuring and normal.  She was able to range her neck.  I did discuss with her that this point not recommending head imaging or lumbar puncture and she feels comfortable and understands the risk and benefit in the decision-making process here.  We discussed return precautions.  DIFFERENTIAL DIAGNOSIS: Differential diagnosis includes, but is not limited to, intracranial hemorrhage, meningitis/encephalitis, previous head trauma, cavernous venous thrombosis, tension headache, temporal arteritis, migraine or migraine equivalent, idiopathic intracranial hypertension, and non-specific headache.  CONSULTATIONS: None  Patient / Family / Caregiver informed of clinical course, medical decision-making process, and agree with plan.   I discussed return precautions, follow-up instructions, and discharge instructions with patient and/or family.  Discharge Instructions : You are evaluated for headache, and although no certain cause was found, your exam and evaluation are overall reassuring in the emergency department today.  Return to emergency department immediately for any new or worsening uncontrolled headache, confusion, altered mental status, dizziness or passing out, facial droop, trouble finding words, one-sided weakness or numbness, stiff neck,  fever, vomiting, or any other symptoms concerning to you.    ___________________________________________   FINAL CLINICAL IMPRESSION(S) / ED DIAGNOSES   Final diagnoses:  Headache, unspecified headache type      ___________________________________________        Note: This dictation was prepared with Dragon dictation. Any transcriptional errors that result from this process are unintentional    Lisa Roca, MD 11/17/17 1452

## 2017-11-17 NOTE — Discharge Instructions (Signed)
You are evaluated for headache, and although no certain cause was found, your exam and evaluation are overall reassuring in the emergency department today.  Return to emergency department immediately for any new or worsening uncontrolled headache, confusion, altered mental status, dizziness or passing out, facial droop, trouble finding words, one-sided weakness or numbness, stiff neck, fever, vomiting, or any other symptoms concerning to you.

## 2017-11-17 NOTE — ED Triage Notes (Signed)
First Nurse Note: Patient sent over from [facility] with possible meningitis. Neck pain, emesis, light sensitivity, arm pain that started yesterday. [place] gave zofran.

## 2017-11-17 NOTE — ED Notes (Signed)
Per EDP meningitis not suspected at this time.  Okay to remove droplet precautions.

## 2017-11-17 NOTE — ED Notes (Addendum)
Pt reports that she was using a hammer earlier in the weak and developed a few spots on her R hand that appeared like calluses from the repetitive use.  Pt also reports that she started having R arm pain and neck stiffness after this.  Yesterday, pt reports that she started to have a headache that felt like pressure to the R side of her head.  Pt reports having a fever once of 100.5, which was relieved by tylenol.  Pt states she had some leftover percocet and took one on Wednesday for the R arm and neck pain.  Pt reports that she gets neck stiffness from time to time.  Pt reports going to [facility] this morning and they sent her here for further evaluation of possible meningitis.  Pt states they did blood work there this morning.  Unknown if meningitis test was ordered.  Denies dizziness, denies fatigue, denies high fevers, and denies confusion.

## 2017-11-17 NOTE — ED Notes (Signed)
Pt discharged to home.  Family member driving.  Discharge instructions reviewed.  Verbalized understanding.  No questions or concerns at this time.  Teach back verified.  Pt in NAD.  No items left in ED.   

## 2017-11-17 NOTE — Telephone Encounter (Signed)
Per patient, she pulled a muscle in her arm on last Sunday.  Put some arthritis cream on the arm, and it got better.  Yesterday, she noticed some blisters on her hand, but she had been using a hammer so the attributed the blisters to that.  On yesterday, she developed the headache and it has lasted all day.  Patient has taken Excedrin headache medicine on yesterday W/O improvement. Patient refused to go to another [facility] practice.  Patient states she would prefer to go to the walk in clinic than drive.  I agreed with the disposition since she has so many symptoms.  Reason for Disposition . [1] New headache AND [2] age > 50  Answer Assessment - Initial Assessment Questions 1. LOCATION: "Where does it hurt?"      Temples, top of head, and eyes 2. ONSET: "When did the headache start?" (Minutes, hours or days)      12 /04/2017 3. PATTERN: "Does the pain come and go, or has it been constant since it started?"   constant 4. SEVERITY: "How bad is the pain?" and "What does it keep you from doing?"  (e.g., Scale 1-10; mild, moderate, or severe)   - MILD (1-3): doesn't interfere with normal activities    - MODERATE (4-7): interferes with normal activities or awakens from sleep    - SEVERE (8-10): excruciating pain, unable to do any normal activities       2 5. RECURRENT SYMPTOM: "Have you ever had headaches before?" If so, ask: "When was the last time?" and "What happened that time?"      Had headache in the past       7. MIGRAINE: "Have you been diagnosed with migraine headaches?" If so, ask: "Is this headache similar?"     Yes, in the past 8. HEAD INJURY: "Has there been any recent injury to the head?"      No 9. OTHER SYMPTOMS: "Do you have any other symptoms?" (fever, stiff neck, eye pain, sore throat, cold symptoms)     Stiff neck, low grade fever 11/16/17, drainage in throat,  10. PREGNANCY: "Is there any chance you are pregnant?" "When was your last menstrual period?"      No  Protocols  used: HEADACHE-A-AH

## 2017-11-20 ENCOUNTER — Emergency Department
Admission: EM | Admit: 2017-11-20 | Discharge: 2017-11-20 | Disposition: A | Discharge status: Home / Self Care | Payer: BLUE CROSS/BLUE SHIELD | Attending: Emergency Medicine | Admitting: Emergency Medicine

## 2017-11-20 ENCOUNTER — Other Ambulatory Visit: Payer: Self-pay

## 2017-11-20 ENCOUNTER — Encounter: Payer: Self-pay | Admitting: Emergency Medicine

## 2017-11-20 DIAGNOSIS — B029 Zoster without complications: Secondary | ICD-10-CM | POA: Insufficient documentation

## 2017-11-20 DIAGNOSIS — R21 Rash and other nonspecific skin eruption: Secondary | ICD-10-CM | POA: Diagnosis not present

## 2017-11-20 MED ORDER — GABAPENTIN 100 MG PO CAPS: 100.0000 mg | ORAL_CAPSULE | Freq: Three times a day (TID) | ORAL | 0 refills | Status: DC

## 2017-11-20 MED ORDER — VALACYCLOVIR HCL 1 G PO TABS: 1000.0000 mg | ORAL_TABLET | Freq: Three times a day (TID) | ORAL | 0 refills | Status: DC

## 2017-11-20 MED ORDER — PREDNISONE 10 MG (21) PO TBPK: ORAL_TABLET | ORAL | 0 refills | Status: DC

## 2017-11-20 NOTE — ED Provider Notes (Signed)
[facility] Emergency Department Provider Note  ____________________________________________  Time seen: Approximately 4:03 PM  I have reviewed the triage vital signs and the nursing notes.   HISTORY  Chief Complaint Rash   HPI Rose Bernard is a 51 y.o. female who presents to the emergency department for evaluation and treatment of a rash that has been present on her right hand since about 11/15/2017.  She states that it started as a painful, burning type pain and then she has noticed small red areas on the palm of her hand that are now extending up the anterior aspect of her forearm, but only on the right side.  She states that this coincidentally started around the same time as an unusual headache for which she was evaluated here and at urgent care on 11/17/2017.  She states that both providers she saw were mainly focused on her headache and did not diagnose or advise about the burning pain in her hand or the rash.  She denies recent injury.  She has been using a hammer to break up some things at work but has not had any specific point of onset.  She has not attempted any alleviating measures for this pain. Past Medical History:  Diagnosis Date  . Chronic cystitis   . Frequent UTI   . Incomplete bladder emptying   . 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 Left carotid bruit 08/02/2017  . Sinusitis, acute maxillary 03/21/2016  . Chest tightness 02/09/2016  . Hot flashes 02/09/2016  . Left groin pain 12/23/2015  . Loss of weight 10/02/2015  . Myofascial pain 09/16/2015  . Spasm 09/16/2015  . Chronic female pelvic pain 09/16/2015  . Abdominal pain, left lower quadrant 09/07/2015  . Abnormal mammogram 04/12/2015  . Health care maintenance 04/12/2015  . Sore throat 10/05/2014  . Flank pain 08/21/2014  . Sacral pain 12/29/2013  . Sinus congestion 12/29/2013  . Rib pain 09/29/2013  .  Urinary frequency 05/15/2013  . Urge incontinence of urine 05/15/2013  . UTI (urinary tract infection) 02/27/2013  . Family history of colon cancer 02/20/2013  . Headache 02/11/2013  . Chronic cystitis 02/11/2013  . History of ovarian cyst 02/11/2013  . Reflux, vesicoureteral 10/15/2012  . Symptoms involving urinary system 10/15/2012  . Mixed incontinence 10/15/2012  . Incomplete bladder emptying 10/15/2012  . Disorder of bladder function 10/15/2012    Past Surgical History:  Procedure Laterality Date  . ABDOMINAL HYSTERECTOMY  1995   2/2 delivery complications with 3rd child. Hysterectomy and bladder repair with subsequent appendectomy. L Ovary remain in place   . APPENDECTOMY     2001 due to scar tissue  . BLADDER BOTOX  2014, 2015, 2016  . BREAST BIOPSY Bilateral 2001   neg  . BREAST SURGERY Bilateral 2002   benign mass removed  . COLONOSCOPY  2014  . FOOT SURGERY Bilateral   . LAPAROSCOPIC HYSTERECTOMY  1995  . reconstruction of bladder   1995    2/2 delivery complications with 3rd child     Prior to Admission medications   Medication Sig Start Date End Date Taking? Authorizing Provider  Diphenhyd-Hydrocort-Nystatin (FIRST-DUKES MOUTHWASH) SUSP 5cc's tid swish and spit. 10/09/17   Einar Pheasant, MD  estradiol (CLIMARA - DOSED IN MG/24 HR) 0.05 mg/24hr patch 1 patch once a week. 08/09/17   [provider]  gabapentin (NEURONTIN) 100 MG capsule Take 1 capsule (100 mg total) by  mouth 3 (three) times daily. 11/20/17 11/20/18  Charlsey Moragne, Dessa Phi, FNP  predniSONE (STERAPRED UNI-PAK 21 TAB) 10 MG (21) TBPK tablet Take 6 tablets on day 1 Take 5 tablets on day 2 Take 4 tablets on day 3 Take 3 tablets on day 4 Take 2 tablets on day 5 Take 1 tablet on day 6 11/20/17   Ulah Olmo B, FNP  valACYclovir (VALTREX) 1000 MG tablet Take 1 tablet (1,000 mg total) by mouth 3 (three) times daily. 11/20/17   Elley Harp, Johnette Abraham B, FNP    Allergies Penicillin v potassium  Family  History  Problem Relation Age of Onset  . Colon cancer Father        With ostomy in place   . Breast cancer Paternal Aunt 63    Social History Social History   Tobacco Use  . Smoking status: Never Smoker  . Smokeless tobacco: Never Used  Substance Use Topics  . Alcohol use: No    Alcohol/week: 0.0 oz  . Drug use: No    Review of Systems  Constitutional: Negative for fever. Respiratory: Negative for cough or shortness of breath.  Musculoskeletal: Negative for myalgias Skin: Positive for rash Neurological: Positive for burning type pain in the right hand.  Negative for current headache ____________________________________________   PHYSICAL EXAM:  VITAL SIGNS: ED Triage Vitals  Enc Vitals Group     BP 11/20/17 1140 132/60     Pulse Rate 11/20/17 1140 76     Resp 11/20/17 1140 18     Temp 11/20/17 1140 98.9 F (37.2 C)     Temp Source 11/20/17 1140 Oral     SpO2 11/20/17 1140 100 %     Weight 11/20/17 1141 125 lb (56.7 kg)     Height 11/20/17 1141 5\' 6"  (1.676 m)     Head Circumference --      Peak Flow --      Pain Score 11/20/17 1143 10     Pain Loc --      Pain Edu? --      Excl. in [place]? --      Constitutional: Chronically ill appearing. Eyes: Conjunctivae are clear without discharge or drainage. Nose: No rhinorrhea noted. Mouth/Throat: Airway is patent.  Neck: No stridor. Unrestricted range of motion observed.  Cardiovascular: Capillary refill is <3 seconds.  Respiratory: Respirations are even and unlabored.. Musculoskeletal: Unrestricted range of motion observed. Neurologic: Awake, alert, and oriented x 4.  Skin: Linear, vesicular lesions on an erythematous base are present on the palmar aspect of the right hand in approximately the ulnar nerve tract.  There are some erythematous lesions that are not vesicular on the upper forearm near the [facility], but no lesions are noted after that point.  There are no lesions on the left arm or on the left side of the  body.  ____________________________________________   LABS (all labs ordered are listed, but only abnormal results are displayed)  Labs Reviewed - No data to display ____________________________________________  EKG  Not indicated ____________________________________________  RADIOLOGY  Not indicated ____________________________________________   PROCEDURES  Procedures ____________________________________________   INITIAL IMPRESSION / ASSESSMENT AND PLAN / ED COURSE  JAY KEMPE is a 51 y.o. female who presents to the emergency department for treatment and evaluation of burning, painful rash that has been present on her right hand and extending up the anterior aspect of her right forearm for the past 5 days.  Although it may not help considerably at this point, she was started  on valacyclovir and prednisone.  She was also given a prescription for gabapentin.  She is to follow-up with her primary care provider in 1 week for recheck.  She was encouraged to return to the emergency department for symptoms change or worsen if she is unable to schedule an appointment. Medications - No data to display   Pertinent labs & imaging results that were available during my care of the patient were reviewed by me and considered in my medical decision making (see chart for details). ____________________________________________   FINAL CLINICAL IMPRESSION(S) / ED DIAGNOSES  Final diagnoses:  Herpes zoster without complication    ED Discharge Orders        Ordered    valACYclovir (VALTREX) 1000 MG tablet  3 times daily     11/20/17 1244    predniSONE (STERAPRED UNI-PAK 21 TAB) 10 MG (21) TBPK tablet     11/20/17 1244    gabapentin (NEURONTIN) 100 MG capsule  3 times daily     11/20/17 1244       Note:  This document was prepared using Dragon voice recognition software and may include unintentional dictation errors.    Victorino Dike, FNP 11/20/17 1613    Carrie Mew, MD 11/22/17 [phone]

## 2017-11-20 NOTE — ED Triage Notes (Addendum)
Rash to right arm starting Friday.  Went to doctor but she also had headache at time and they were only worried about headache and did not really evaluate/treat rash.. Small red bumps to palm of hand and arm.  Has a spot on back and chest as well. They are draining. No fevers. Rash is painful, does not itch. They are not fluid filled.

## 2017-11-20 NOTE — ED Notes (Signed)
See triage note presents with a rash to right hand for couple of days  Rash is now moving up arm and into chest  Describes rash as painful

## 2017-11-28 ENCOUNTER — Telehealth: Payer: Self-pay | Admitting: Internal Medicine

## 2017-11-28 MED ORDER — GABAPENTIN 100 MG PO CAPS: 100.0000 mg | ORAL_CAPSULE | Freq: Three times a day (TID) | ORAL | 1 refills | Status: DC

## 2017-11-28 NOTE — Telephone Encounter (Signed)
Pt requesting refill of Gabapentin and Valtrex. Pt was given prescriptions during ED visit on 12/10 for shingles.

## 2017-11-28 NOTE — Telephone Encounter (Signed)
Please advise 

## 2017-11-28 NOTE — Telephone Encounter (Signed)
Ok to refill neurontin x 1, but she should not need a refill on valtrex.  This is prescribed to hopefully shorten course - initially.

## 2017-11-28 NOTE — Telephone Encounter (Signed)
Copied from [place] [id number]. Topic: Quick Communication - Rx Refill/Question >> Nov 28, 2017  8:17 AM Carolyn Stare wrote:  Pt req refill on the following meds. She said she had went to the er and she has shingles and is still having pain .   [place]   Agent: Please be advised that RX refills may take up to 3 business days. We ask that you follow-up with your pharmacy.

## 2017-11-28 NOTE — Telephone Encounter (Signed)
Medication has been sent. Valtrex was not approved per Dr. Nicki Reaper.

## 2017-11-29 DIAGNOSIS — N3281 Overactive bladder: Secondary | ICD-10-CM | POA: Diagnosis not present

## 2017-11-29 DIAGNOSIS — N3020 Other chronic cystitis without hematuria: Secondary | ICD-10-CM | POA: Diagnosis not present

## 2017-12-11 DIAGNOSIS — N200 Calculus of kidney: Secondary | ICD-10-CM | POA: Diagnosis not present

## 2017-12-11 DIAGNOSIS — R339 Retention of urine, unspecified: Secondary | ICD-10-CM | POA: Diagnosis not present

## 2018-01-15 ENCOUNTER — Ambulatory Visit (INDEPENDENT_AMBULATORY_CARE_PROVIDER_SITE_OTHER): Payer: BLUE CROSS/BLUE SHIELD | Admitting: Internal Medicine

## 2018-01-15 ENCOUNTER — Encounter: Payer: Self-pay | Admitting: Internal Medicine

## 2018-01-15 VITALS — BP 118/66 | HR 66 | Temp 98.3°F | Resp 16 | Wt 131.2 lb

## 2018-01-15 DIAGNOSIS — N3020 Other chronic cystitis without hematuria: Secondary | ICD-10-CM

## 2018-01-15 DIAGNOSIS — R0989 Other specified symptoms and signs involving the circulatory and respiratory systems: Secondary | ICD-10-CM | POA: Diagnosis not present

## 2018-01-15 DIAGNOSIS — Z0000 Encounter for general adult medical examination without abnormal findings: Secondary | ICD-10-CM

## 2018-01-15 DIAGNOSIS — B029 Zoster without complications: Secondary | ICD-10-CM

## 2018-01-15 DIAGNOSIS — Z1211 Encounter for screening for malignant neoplasm of colon: Secondary | ICD-10-CM

## 2018-01-15 DIAGNOSIS — Z800 Family history of malignant neoplasm of digestive organs: Secondary | ICD-10-CM

## 2018-01-15 DIAGNOSIS — N319 Neuromuscular dysfunction of bladder, unspecified: Secondary | ICD-10-CM | POA: Diagnosis not present

## 2018-01-15 MED ORDER — GABAPENTIN 100 MG PO CAPS: 100.0000 mg | ORAL_CAPSULE | Freq: Three times a day (TID) | ORAL | 0 refills | Status: DC

## 2018-01-15 NOTE — Assessment & Plan Note (Addendum)
Physical today 01/15/18.  PAP 12/08/16 - negative with negative HPV.  Colonoscopy 02/13/13 - recommended f/u in 5 years.  Mammogram 06/28/17 - Briads I.  Due colonoscopy.  Refer to GI.

## 2018-01-15 NOTE — Progress Notes (Signed)
Patient ID: Rose Bernard, female   DOB: 1966-09-17, 52 y.o.   MRN: [id number]   Subjective:    Patient ID: Rose Bernard, female    DOB: [birth year], 52 y.o.   MRN: [id number]  HPI  Patient here for her physical exam.  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  Receiving botx injections.  Last - 11/29/17.  Reports things are stable with her bladder.  She was recently evaluated for rash and headache.  See ER notes.  Diagnosed with shingles.  Treated with prednisone and valtrex.  Did not take gabapentin.  States she still has some burning and discomfort.  Still some residual "lesions" - better.  No significant headache.  No chest pain.  Stays active.  No sob.  No acid reflux.  No abdominal pain.  Bowels moving.     Past Medical History:  Diagnosis Date  . Chronic cystitis   . Frequent UTI   . Incomplete bladder emptying   . 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 ABDOMINAL HYSTERECTOMY  1995   2/2 delivery complications with 3rd child. Hysterectomy and bladder repair with subsequent appendectomy. L Ovary remain in place   . APPENDECTOMY     2001 due to scar tissue  . BLADDER BOTOX  2014, 2015, 2016  . BREAST BIOPSY Bilateral 2001   neg  . BREAST SURGERY Bilateral 2002   benign mass removed  . COLONOSCOPY  2014  . FOOT SURGERY Bilateral   . LAPAROSCOPIC HYSTERECTOMY  1995  . reconstruction of bladder   1995    2/2 delivery complications with 3rd child    Family History  Problem Relation Age of Onset  . Colon cancer Father        With ostomy in place   . Breast cancer Paternal Aunt 51   Social History   Socioeconomic History  . Marital status: Married    Spouse name: None  . Number of children: 2  . Years of education: None  . Highest education level: None  Social Needs  . Financial resource strain: None  . Food insecurity - worry: None  . Food insecurity - inability: None  . Transportation needs -  medical: None  . Transportation needs - non-medical: None  Occupational History  . None  Tobacco Use  . Smoking status: Never Smoker  . Smokeless tobacco: Never Used  Substance and Sexual Activity  . Alcohol use: No    Alcohol/week: 0.0 oz  . Drug use: No  . Sexual activity: Yes    Birth control/protection: Surgical  Other Topics Concern  . None  Social History Narrative  . None    Outpatient Encounter Medications as of 01/15/2018  Medication Sig  . Diphenhyd-Hydrocort-Nystatin (FIRST-DUKES MOUTHWASH) SUSP 5cc's tid swish and spit.  Marland Kitchen estradiol (CLIMARA - DOSED IN MG/24 HR) 0.05 mg/24hr patch 1 patch once a week.  . gabapentin (NEURONTIN) 100 MG capsule Take 1 capsule (100 mg total) by mouth 3 (three) times daily.  . [DISCONTINUED] gabapentin (NEURONTIN) 100 MG capsule Take 1 capsule (100 mg total) by mouth 3 (three) times daily.  . [DISCONTINUED] gabapentin (NEURONTIN) 100 MG capsule Take 1 capsule (100 mg total) by mouth 3 (three) times daily.  . [DISCONTINUED] predniSONE (STERAPRED UNI-PAK 21 TAB) 10 MG (21) TBPK tablet Take 6 tablets on day 1 Take 5 tablets on day 2 Take 4 tablets on day 3 Take 3 tablets on day 4 Take 2 tablets  on day 5 Take 1 tablet on day 6  . [DISCONTINUED] valACYclovir (VALTREX) 1000 MG tablet Take 1 tablet (1,000 mg total) by mouth 3 (three) times daily.   No facility-administered encounter medications on file as of 01/15/2018.     Review of Systems  Constitutional: Negative for chills and fever.  HENT: Negative for congestion and sinus pressure.   Eyes: Negative for pain and visual disturbance.  Respiratory: Negative for cough, chest tightness and shortness of breath.   Cardiovascular: Negative for chest pain, palpitations and leg swelling.  Gastrointestinal: Negative for abdominal pain, diarrhea, nausea and vomiting.  Genitourinary: Negative for difficulty urinating and dysuria.  Musculoskeletal: Negative for joint swelling and myalgias.  Skin:  Positive for rash. Negative for color change.  Neurological: Negative for dizziness and light-headedness.       No significant headache reported now.    Hematological: Negative for adenopathy. Does not bruise/bleed easily.  Psychiatric/Behavioral: Negative for agitation and dysphoric mood.       Objective:    Physical Exam  Constitutional: She is oriented to person, place, and time. She appears well-developed and well-nourished. No distress.  HENT:  Nose: Nose normal.  Mouth/Throat: Oropharynx is clear and moist.  Eyes: Right eye exhibits no discharge. Left eye exhibits no discharge. No scleral icterus.  Neck: Neck supple. No thyromegaly present.  Cardiovascular: Normal rate and regular rhythm.  Pulmonary/Chest: Breath sounds normal. No accessory muscle usage. No tachypnea. No respiratory distress. She has no decreased breath sounds. She has no wheezes. She has no rhonchi. Right breast exhibits no inverted nipple, no mass, no nipple discharge and no tenderness (no axillary adenopathy). Left breast exhibits no inverted nipple, no mass, no nipple discharge and no tenderness (no axilarry adenopathy).  Abdominal: Soft. Bowel sounds are normal. There is no tenderness.  Musculoskeletal: She exhibits no edema or tenderness.  Lymphadenopathy:    She has no cervical adenopathy.  Neurological: She is alert and oriented to person, place, and time.  Skin: Skin is warm. No erythema.  Few residual areas from previous shingles on right hand.    Psychiatric: She has a normal mood and affect. Her behavior is normal.    BP 118/66 (BP Location: Left Arm, Patient Position: Sitting, Cuff Size: Normal)   Pulse 66   Temp 98.3 F (36.8 C) (Oral)   Resp 16   Wt 131 lb 3.2 oz (59.5 kg)   LMP 02/07/1994   SpO2 99%   BMI 21.18 kg/m  Wt Readings from Last 3 Encounters:  01/15/18 131 lb 3.2 oz (59.5 kg)  11/20/17 125 lb (56.7 kg)  11/17/17 125 lb (56.7 kg)     Lab Results  Component Value Date    WBC 5.4 08/02/2017   HGB 12.7 08/02/2017   HCT 38.4 08/02/2017   PLT 259.0 08/02/2017   GLUCOSE 83 08/02/2017   CHOL 180 10/16/2014   TRIG 46.0 10/16/2014   HDL 55.80 10/16/2014   LDLCALC 115 (H) 10/16/2014   ALT 11 08/02/2017   AST 11 08/02/2017   NA 143 08/02/2017   K 3.8 08/02/2017   CL 107 08/02/2017   CREATININE 0.81 08/02/2017   BUN 19 08/02/2017   CO2 31 08/02/2017   TSH 1.62 08/02/2017   HGBA1C 5.6 10/01/2015       Assessment & Plan:   Problem List Items Addressed This Visit    Chronic cystitis    Followed by Dr Jacqlyn Larsen.        Disorder of bladder function  Followed by Dr Jacqlyn Larsen.  Last injection 11/2017.        Family history of colon cancer    Colonoscopy 02/2013.  Recommended f/u in 5 years.  Due.  Refer to GI.        Relevant Orders   Ambulatory referral to Gastroenterology   Health care maintenance    Physical today 01/15/18.  PAP 12/08/16 - negative with negative HPV.  Colonoscopy 02/13/13 - recommended f/u in 5 years.  Mammogram 06/28/17 - Briads I.  Due colonoscopy.  Refer to GI.        Left carotid bruit    Carotid ultrasound 07/2017 - no significant stenosis.        Shingles    Recent diagnosis.  Treated with valtrex previously.  Has gabapentin. Some residual burning.  Instructed to take gabapentin 100mg  q hs.  Follow.  Notify me if any change or worsening symptoms.         Other Visit Diagnoses    Colon cancer screening    -  Primary   Relevant Orders   Ambulatory referral to Gastroenterology       Einar Pheasant, MD

## 2018-01-18 ENCOUNTER — Encounter: Payer: Self-pay | Admitting: Internal Medicine

## 2018-01-18 DIAGNOSIS — B029 Zoster without complications: Secondary | ICD-10-CM | POA: Insufficient documentation

## 2018-01-18 NOTE — Assessment & Plan Note (Signed)
Colonoscopy 02/2013.  Recommended f/u in 5 years.  Due.  Refer to GI.

## 2018-01-18 NOTE — Assessment & Plan Note (Signed)
Recent diagnosis.  Treated with valtrex previously.  Has gabapentin. Some residual burning.  Instructed to take gabapentin 100mg  q hs.  Follow.  Notify me if any change or worsening symptoms.

## 2018-01-18 NOTE — Assessment & Plan Note (Signed)
Followed by Dr Jacqlyn Larsen.  Last injection 11/2017.

## 2018-01-18 NOTE — Assessment & Plan Note (Signed)
Followed by Dr Cope.  

## 2018-01-18 NOTE — Assessment & Plan Note (Signed)
Carotid ultrasound 07/2017 - no significant stenosis.

## 2018-02-27 DIAGNOSIS — R350 Frequency of micturition: Secondary | ICD-10-CM | POA: Diagnosis not present

## 2018-02-27 DIAGNOSIS — R339 Retention of urine, unspecified: Secondary | ICD-10-CM | POA: Diagnosis not present

## 2018-02-27 DIAGNOSIS — N1370 Vesicoureteral-reflux, unspecified: Secondary | ICD-10-CM | POA: Diagnosis not present

## 2018-02-27 DIAGNOSIS — N3281 Overactive bladder: Secondary | ICD-10-CM | POA: Diagnosis not present

## 2018-02-27 DIAGNOSIS — N3020 Other chronic cystitis without hematuria: Secondary | ICD-10-CM | POA: Diagnosis not present

## 2018-02-27 DIAGNOSIS — N200 Calculus of kidney: Secondary | ICD-10-CM | POA: Diagnosis not present

## 2018-03-22 ENCOUNTER — Ambulatory Visit (INDEPENDENT_AMBULATORY_CARE_PROVIDER_SITE_OTHER): Payer: BLUE CROSS/BLUE SHIELD | Admitting: Family Medicine

## 2018-03-22 ENCOUNTER — Encounter: Payer: Self-pay | Admitting: Family Medicine

## 2018-03-22 VITALS — BP 116/68 | HR 64 | Temp 98.3°F | Resp 16 | Wt 134.0 lb

## 2018-03-22 DIAGNOSIS — R21 Rash and other nonspecific skin eruption: Secondary | ICD-10-CM | POA: Diagnosis not present

## 2018-03-22 MED ORDER — VALACYCLOVIR HCL 1 G PO TABS: 1000.0000 mg | ORAL_TABLET | Freq: Three times a day (TID) | ORAL | 0 refills | Status: AC

## 2018-03-22 NOTE — Progress Notes (Signed)
Patient ID: Rose Bernard, female   DOB: [birth year], 52 y.o.   MRN: [id number]   PCP: Einar Pheasant, MD  Subjective:  Rose Bernard is a 52 y.o. year old very pleasant female patient who presents with a Rash: started one day ago  Initial distribution: left lower leg Prior history of this rash:No Discomfort associated:Pain and burning Associated symptoms: tingling sensatoin Change in rash:  No Denies:  Fever, chills, sweats, myalgias, difficulty swallowing, hoarseness, SOB, N/V Abdominal pain, or tightening throat. No new exposures of soaps, lotions, laundry detergent, fabric softeners, foods, medications, herbal supplements, STDs, plants, animals, or insects.  -started: one day ago , symptoms are not improving  -previous treatments: [place] powder did not provide benefit. She takes gabapentin related to history of herpes zoster pain  -sick contacts/travel/risks: denies flu exposure. No recent sick contact exposure -Hx of: allergies  In 11/20/17 she was evaluated in the ED for herpes zoster that was noted on the anterior aspect of her right forearm.  She was treated with antiviral, prednisone, and gabapentin for discomfort.  ROS-denies fever, SOB, NVD,   Pertinent Past Medical History- Chronic cystitis and Hot flashes  Medications- reviewed  Current Outpatient Medications  Medication Sig Dispense Refill  . Diphenhyd-Hydrocort-Nystatin (FIRST-DUKES MOUTHWASH) SUSP 5cc's tid swish and spit. 237 mL 0  . estradiol (CLIMARA - DOSED IN MG/24 HR) 0.05 mg/24hr patch 1 patch once a week.    . gabapentin (NEURONTIN) 100 MG capsule Take 1 capsule (100 mg total) by mouth 3 (three) times daily. 90 capsule 0   No current facility-administered medications for this visit.     Objective: BP 116/68 (BP Location: Left Arm, Patient Position: Sitting, Cuff Size: Normal)   Pulse 64   Temp 98.3 F (36.8 C) (Oral)   Resp 16   Wt 134 lb (60.8 kg)   LMP 02/07/1994   SpO2 98%   BMI 21.63  kg/m  Gen: NAD, resting comfortably HEENT: Oropharynx is clear and moist CV: RRR no murmurs rubs or gallops Lungs: CTAB no crackles, wheeze, rhonchi Abdomen: soft/nontender/nondistended/normal bowel sounds. No rebound or guarding.  Ext: no edema Skin: warm, dry, Linear, erythematous rash present on medial aspect of left lower leg near ankle . No vesicles present. No lesions noted on right leg. Neuro: grossly normal, moves all extremities  Assessment/Plan:  Rash History and exam today are suggestive of herpes zoster. Will initiate treatment with valacyclovir as window of treatment is within 48 hours of symptoms starting. She will continue gabapentin and follow up with PCP if no improvement, rash persists, or new symptoms develop. Finally, we reviewed reasons to return to care including if symptoms worsen or persist or new concerns arise- once again particularly rash that does not improve,  shortness of breath, N/V, or fever. Also if rash is on her face or nose, we discussed the importance of immediate medical attention. Also reviewed importance of calling 911 immediately if symptoms of SOB, difficulty swallowing, or throat tightening.    Laurita Quint, FNP

## 2018-03-22 NOTE — Patient Instructions (Signed)
Please take medication as directed and follow up with Dr. Nicki Reaper if symptoms do not improve with treatment, worsen, or new symptoms develop.   Shingles Shingles is an infection that causes a painful skin rash and fluid-filled blisters. Shingles is caused by the same virus that causes chickenpox. Shingles only develops in people who:  Have had chickenpox.  Have gotten the chickenpox vaccine. (This is rare.)  The first symptoms of shingles may be itching, tingling, or pain in an area on your skin. A rash will follow in a few days or weeks. The rash is usually on one side of the body in a bandlike or beltlike pattern. Over time, the rash turns into fluid-filled blisters that break open, scab over, and dry up. Medicines may:  Help you manage pain.  Help you recover more quickly.  Help to prevent long-term problems.  Follow these instructions at home: Medicines  Take medicines only as told by your doctor.  Apply an anti-itch or numbing cream to the affected area as told by your doctor. Blister and Rash Care  Take a cool bath or put cool compresses on the area of the rash or blisters as told by your doctor. This may help with pain and itching.  Keep your rash covered with a loose bandage (dressing). Wear loose-fitting clothing.  Keep your rash and blisters clean with mild soap and cool water or as told by your doctor.  Check your rash every day for signs of infection. These include redness, swelling, and pain that lasts or gets worse.  Do not pick your blisters.  Do not scratch your rash. General instructions  Rest as told by your doctor.  Keep all follow-up visits as told by your doctor. This is important.  Until your blisters scab over, your infection can cause chickenpox in people who have never had it or been vaccinated against it. To prevent this from happening, avoid touching other people or being around other people, especially: ? Babies. ? Pregnant women. ? Children  who have eczema. ? Elderly people who have transplants. ? People who have chronic illnesses, such as leukemia or AIDS. Contact a doctor if:  Your pain does not get better with medicine.  Your pain does not get better after the rash heals.  Your rash looks infected. Signs of infection include: ? Redness. ? Swelling. ? Pain that lasts or gets worse. Get help right away if:  The rash is on your face or nose.  You have pain in your face, pain around your eye area, or loss of feeling on one side of your face.  You have ear pain or you have ringing in your ear.  You have loss of taste.  Your condition gets worse. This information is not intended to replace advice given to you by your health care provider. Make sure you discuss any questions you have with your health care provider. Document Released: 05/16/2008 Document Revised: 07/24/2016 Document Reviewed: 09/09/2014 Elsevier Interactive Patient Education  [employer].

## 2018-03-27 DIAGNOSIS — D649 Anemia, unspecified: Secondary | ICD-10-CM | POA: Diagnosis not present

## 2018-03-27 DIAGNOSIS — Z800 Family history of malignant neoplasm of digestive organs: Secondary | ICD-10-CM | POA: Diagnosis not present

## 2018-03-31 DIAGNOSIS — D649 Anemia, unspecified: Secondary | ICD-10-CM | POA: Insufficient documentation

## 2018-04-16 ENCOUNTER — Telehealth: Payer: Self-pay | Admitting: Internal Medicine

## 2018-04-16 NOTE — Telephone Encounter (Signed)
PT dropped off FMLA paper work to be completed by Dr. Nicki Reaper. Papers are up front in Dr. Bary Leriche colored folder. Pt will pick up paper work on Friday, when she comes in for an appt. With Dr. Nicki Reaper.

## 2018-04-18 NOTE — Telephone Encounter (Signed)
Forms placed in Dr. Nicki Reaper folders & she is aware of the timeframe to complete.

## 2018-04-18 NOTE — Telephone Encounter (Addendum)
Patient has appt on 04/20/2018 in 2 days.  Paperwork was STILL in folder up front.  Paperwork given to [facility] for Dr. Nicki Reaper to complete.

## 2018-04-19 NOTE — Telephone Encounter (Signed)
She has an appt with me tomorrow.  Will give to her at appt.

## 2018-04-19 NOTE — Telephone Encounter (Signed)
Paperwork completed and placed in blue folder. Patient will pick up tomorrow

## 2018-04-19 NOTE — Telephone Encounter (Signed)
There is a copy of a previous FMLA completed.  Can someone in the office complete what can be completed and then I can review.   Thanks

## 2018-04-20 ENCOUNTER — Ambulatory Visit (INDEPENDENT_AMBULATORY_CARE_PROVIDER_SITE_OTHER): Payer: BLUE CROSS/BLUE SHIELD | Admitting: Internal Medicine

## 2018-04-20 ENCOUNTER — Encounter: Payer: Self-pay | Admitting: Internal Medicine

## 2018-04-20 VITALS — BP 100/60 | HR 64 | Temp 98.0°F | Resp 12 | Wt 131.0 lb

## 2018-04-20 DIAGNOSIS — N319 Neuromuscular dysfunction of bladder, unspecified: Secondary | ICD-10-CM

## 2018-04-20 DIAGNOSIS — N3020 Other chronic cystitis without hematuria: Secondary | ICD-10-CM

## 2018-04-20 DIAGNOSIS — F439 Reaction to severe stress, unspecified: Secondary | ICD-10-CM | POA: Diagnosis not present

## 2018-04-20 DIAGNOSIS — Z800 Family history of malignant neoplasm of digestive organs: Secondary | ICD-10-CM

## 2018-04-20 DIAGNOSIS — R21 Rash and other nonspecific skin eruption: Secondary | ICD-10-CM | POA: Diagnosis not present

## 2018-04-20 MED ORDER — GABAPENTIN 100 MG PO CAPS: 100.0000 mg | ORAL_CAPSULE | Freq: Every evening | ORAL | 1 refills | Status: DC | PRN

## 2018-04-20 MED ORDER — TRIAMCINOLONE ACETONIDE 0.1 % EX CREA: 1.0000 "application " | TOPICAL_CREAM | Freq: Two times a day (BID) | CUTANEOUS | 0 refills | Status: DC

## 2018-04-20 NOTE — Progress Notes (Signed)
Patient ID: Rose Bernard, female   DOB: January 04, 1966, 52 y.o.   MRN: [id number]   Subjective:    Patient ID: Rose Bernard, female    DOB: 21-Aug-1966, 52 y.o.   MRN: [id number]  HPI  Patient here for a scheduled follow up.  She reports some increased stress with work and some family issues.  Discussed with her today.  Overall she feels she is handling things relatively well.  Does not feel needs any further intervention.  Stays active.  No chest pain.  No sob. No acid reflux.  Bowels moving.  Seeing GI.  They recommended cbc and iron studies to be checked.  Planning for colonoscopy 07/2018.  She is on gabapentin.  Feels is helping.  Will continue.  Rash - ankle.  Some ankle swelling.  No swelling extending up the leg.  Stands a lot.  Discussed venostasis and wearing compression hose.     Past Medical History:  Diagnosis Date  . Chronic cystitis   . Frequent UTI   . Incomplete bladder emptying   . 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 ABDOMINAL HYSTERECTOMY  1995   2/2 delivery complications with 3rd child. Hysterectomy and bladder repair with subsequent appendectomy. L Ovary remain in place   . APPENDECTOMY     2001 due to scar tissue  . BLADDER BOTOX  2014, 2015, 2016  . BREAST BIOPSY Bilateral 2001   neg  . BREAST SURGERY Bilateral 2002   benign mass removed  . COLONOSCOPY  2014  . FOOT SURGERY Bilateral   . LAPAROSCOPIC HYSTERECTOMY  1995  . reconstruction of bladder   1995    2/2 delivery complications with 3rd child    Family History  Problem Relation Age of Onset  . Colon cancer Father        With ostomy in place   . Breast cancer Paternal Aunt 32   Social History   Socioeconomic History  . Marital status: Married    Spouse name: Not on file  . Number of children: 2  . Years of education: Not on file  . Highest education level: Not on file  Occupational History  . Not on file  Social Needs  .  Financial resource strain: Not on file  . Food insecurity:    Worry: Not on file    Inability: Not on file  . Transportation needs:    Medical: Not on file    Non-medical: Not on file  Tobacco Use  . Smoking status: Never Smoker  . Smokeless tobacco: Never Used  Substance and Sexual Activity  . Alcohol use: No    Alcohol/week: 0.0 oz  . Drug use: No  . Sexual activity: Yes    Birth control/protection: Surgical  Lifestyle  . Physical activity:    Days per week: Not on file    Minutes per session: Not on file  . Stress: Not on file  Relationships  . Social connections:    Talks on phone: Not on file    Gets together: Not on file    Attends religious service: Not on file    Active member of club or organization: Not on file    Attends meetings of clubs or organizations: Not on file    Relationship status: Not on file  Other Topics Concern  . Not on file  Social History Narrative  . Not on file    Outpatient Encounter Medications as  of 04/20/2018  Medication Sig  . estradiol (CLIMARA - DOSED IN MG/24 HR) 0.05 mg/24hr patch 1 patch once a week.  . gabapentin (NEURONTIN) 100 MG capsule Take 1 capsule (100 mg total) by mouth at bedtime as needed.  . [DISCONTINUED] gabapentin (NEURONTIN) 100 MG capsule Take 1 capsule (100 mg total) by mouth 3 (three) times daily.  . Diphenhyd-Hydrocort-Nystatin (FIRST-DUKES MOUTHWASH) SUSP 5cc's tid swish and spit. (Patient not taking: Reported on 04/20/2018)  . triamcinolone cream (KENALOG) 0.1 % Apply 1 application topically 2 (two) times daily.   No facility-administered encounter medications on file as of 04/20/2018.     Review of Systems  Constitutional: Negative for appetite change and unexpected weight change.  HENT: Negative for congestion and sinus pressure.   Respiratory: Negative for cough, chest tightness and shortness of breath.   Cardiovascular: Negative for chest pain and palpitations.       Some ankle swelling as outlined.      Gastrointestinal: Negative for diarrhea, nausea and vomiting.  Genitourinary: Negative for dysuria.  Musculoskeletal: Negative for joint swelling and myalgias.  Skin: Negative for color change and rash.  Neurological: Negative for dizziness, light-headedness and headaches.  Psychiatric/Behavioral: Negative for agitation and dysphoric mood.       Increased stress as outlined.         Objective:    Physical Exam  Constitutional: She appears well-developed and well-nourished. No distress.  HENT:  Nose: Nose normal.  Mouth/Throat: Oropharynx is clear and moist.  Neck: Neck supple. No thyromegaly present.  Cardiovascular: Normal rate and regular rhythm.  Pulmonary/Chest: Breath sounds normal. No respiratory distress. She has no wheezes.  Abdominal: Soft. Bowel sounds are normal. There is no tenderness.  Musculoskeletal: She exhibits no tenderness.  Minimal ankle swelling.  No swelling extending up the leg.  No increased erythema or warmth.   Lymphadenopathy:    She has no cervical adenopathy.  Skin: No erythema.  Erythematous rash - left ankle.  Question of stasis dermatitis.  Psychiatric: She has a normal mood and affect. Her behavior is normal.    BP 100/60 (BP Location: Left Arm, Patient Position: Sitting, Cuff Size: Normal)   Pulse 64   Temp 98 F (36.7 C) (Oral)   Resp 12   Wt 131 lb (59.4 kg)   LMP 02/07/1994   SpO2 99%   BMI 21.14 kg/m  Wt Readings from Last 3 Encounters:  04/20/18 131 lb (59.4 kg)  03/22/18 134 lb (60.8 kg)  01/15/18 131 lb 3.2 oz (59.5 kg)     Lab Results  Component Value Date   WBC 5.4 04/20/2018   HGB 12.5 04/20/2018   HCT 37.0 04/20/2018   PLT 254 04/20/2018   GLUCOSE 83 08/02/2017   CHOL 180 10/16/2014   TRIG 46.0 10/16/2014   HDL 55.80 10/16/2014   LDLCALC 115 (H) 10/16/2014   ALT 11 08/02/2017   AST 11 08/02/2017   NA 143 08/02/2017   K 3.8 08/02/2017   CL 107 08/02/2017   CREATININE 0.81 08/02/2017   BUN 19 08/02/2017    CO2 31 08/02/2017   TSH 1.62 08/02/2017   HGBA1C 5.6 10/01/2015       Assessment & Plan:   Problem List Items Addressed This Visit    Chronic cystitis    Followed by Dr Jacqlyn Larsen.  Stable.       Disorder of bladder function    Followed by Dr Jacqlyn Larsen.        Family history of colon cancer -  Primary    Planning for colonoscopy in 07/2018.  GI recommended cbc and iron studies to be drawn today.        Relevant Orders   CBC with Differential/Platelet (Completed)   Iron, TIBC and Ferritin Panel (Completed)   Stress    Increased stress as outlined.  Discussed with her today.  She does not feel needs any further intervention.  Follow.         Other Visit Diagnoses    Rash       Rash - ankle.  TCC .1%.  noitfy me if persistent.  discussed compression hose.         Einar Pheasant, MD

## 2018-04-21 LAB — IRON,TIBC AND FERRITIN PANEL
%SAT: 26 % (calc) (ref 11–50)
Ferritin: 11 ng/mL (ref 10–232)
IRON: 94 ug/dL (ref 45–160)
TIBC: 360 ug/dL (ref 250–450)

## 2018-04-21 LAB — CBC WITH DIFFERENTIAL/PLATELET
BASOS PCT: 1.3 %
Basophils Absolute: 70 cells/uL (ref 0–200)
EOS PCT: 1.5 %
Eosinophils Absolute: 81 cells/uL (ref 15–500)
HCT: 37 % (ref 35.0–45.0)
Hemoglobin: 12.5 g/dL (ref 11.7–15.5)
Lymphs Abs: 1922 cells/uL (ref 850–3900)
MCH: 29.8 pg (ref 27.0–33.0)
MCHC: 33.8 g/dL (ref 32.0–36.0)
MCV: 88.1 fL (ref 80.0–100.0)
MPV: 9.8 fL (ref 7.5–12.5)
Monocytes Relative: 10.4 %
Neutro Abs: 2765 cells/uL (ref 1500–7800)
Neutrophils Relative %: 51.2 %
PLATELETS: 254 10*3/uL (ref 140–400)
RBC: 4.2 10*6/uL (ref 3.80–5.10)
RDW: 13.4 % (ref 11.0–15.0)
TOTAL LYMPHOCYTE: 35.6 %
WBC: 5.4 10*3/uL (ref 3.8–10.8)
WBCMIX: 562 {cells}/uL (ref 200–950)

## 2018-04-23 ENCOUNTER — Encounter: Payer: Self-pay | Admitting: Internal Medicine

## 2018-04-23 DIAGNOSIS — F439 Reaction to severe stress, unspecified: Secondary | ICD-10-CM | POA: Insufficient documentation

## 2018-04-23 NOTE — Assessment & Plan Note (Signed)
Followed by Dr Cope.  Stable.  

## 2018-04-23 NOTE — Assessment & Plan Note (Signed)
Planning for colonoscopy in 07/2018.  GI recommended cbc and iron studies to be drawn today.

## 2018-04-23 NOTE — Assessment & Plan Note (Signed)
Increased stress as outlined.  Discussed with her today.  She does not feel needs any further intervention.  Follow.   

## 2018-04-23 NOTE — Assessment & Plan Note (Signed)
Followed by Dr Cope.  

## 2018-04-25 ENCOUNTER — Ambulatory Visit: Payer: Self-pay | Admitting: *Deleted

## 2018-04-25 NOTE — Telephone Encounter (Signed)
Not a true  Triage call patient called for lab results and results were given to patient  - there  was a result note in [facility] results  Cue

## 2018-05-16 DIAGNOSIS — N3281 Overactive bladder: Secondary | ICD-10-CM | POA: Diagnosis not present

## 2018-05-24 DIAGNOSIS — N3281 Overactive bladder: Secondary | ICD-10-CM | POA: Diagnosis not present

## 2018-05-24 DIAGNOSIS — R350 Frequency of micturition: Secondary | ICD-10-CM | POA: Diagnosis not present

## 2018-08-02 DIAGNOSIS — Z800 Family history of malignant neoplasm of digestive organs: Secondary | ICD-10-CM | POA: Diagnosis not present

## 2018-08-02 DIAGNOSIS — K5730 Diverticulosis of large intestine without perforation or abscess without bleeding: Secondary | ICD-10-CM | POA: Diagnosis not present

## 2018-08-02 DIAGNOSIS — K640 First degree hemorrhoids: Secondary | ICD-10-CM | POA: Diagnosis not present

## 2018-08-02 DIAGNOSIS — Z1211 Encounter for screening for malignant neoplasm of colon: Secondary | ICD-10-CM | POA: Diagnosis not present

## 2018-08-02 LAB — HM COLONOSCOPY

## 2018-08-06 ENCOUNTER — Other Ambulatory Visit: Payer: Self-pay | Admitting: Internal Medicine

## 2018-08-06 DIAGNOSIS — Z1231 Encounter for screening mammogram for malignant neoplasm of breast: Secondary | ICD-10-CM

## 2018-08-16 ENCOUNTER — Ambulatory Visit
Admission: RE | Admit: 2018-08-16 | Discharge: 2018-08-16 | Disposition: A | Discharge status: Home / Self Care | Payer: BLUE CROSS/BLUE SHIELD | Source: Ambulatory Visit | Attending: Internal Medicine | Admitting: Internal Medicine

## 2018-08-16 DIAGNOSIS — Z1231 Encounter for screening mammogram for malignant neoplasm of breast: Secondary | ICD-10-CM | POA: Insufficient documentation

## 2018-08-21 ENCOUNTER — Ambulatory Visit (INDEPENDENT_AMBULATORY_CARE_PROVIDER_SITE_OTHER): Payer: BLUE CROSS/BLUE SHIELD | Admitting: Internal Medicine

## 2018-08-21 ENCOUNTER — Encounter: Payer: Self-pay | Admitting: Internal Medicine

## 2018-08-21 VITALS — BP 100/60 | HR 72 | Temp 98.1°F | Resp 18 | Wt 132.0 lb

## 2018-08-21 DIAGNOSIS — R339 Retention of urine, unspecified: Secondary | ICD-10-CM | POA: Diagnosis not present

## 2018-08-21 DIAGNOSIS — N3020 Other chronic cystitis without hematuria: Secondary | ICD-10-CM

## 2018-08-21 DIAGNOSIS — F439 Reaction to severe stress, unspecified: Secondary | ICD-10-CM

## 2018-08-21 DIAGNOSIS — N319 Neuromuscular dysfunction of bladder, unspecified: Secondary | ICD-10-CM | POA: Diagnosis not present

## 2018-08-21 DIAGNOSIS — Z13220 Encounter for screening for lipoid disorders: Secondary | ICD-10-CM

## 2018-08-21 NOTE — Progress Notes (Signed)
Patient ID: Rose Bernard, female   DOB: 1966/06/28, 52 y.o.   MRN: [id number]   Subjective:    Patient ID: Rose Bernard, female    DOB: February 17, 1966, 52 y.o.   MRN: [id number]  HPI  Patient here for a scheduled follow up.  She reports she is doing relatively well.  Still dealing with increased stress, but overall doing better.  Feels she is handling things better.  Stays active.  No chest pain.  No sob. No acid reflux.  No abdominal pain.  Bowels moving.  Planning to f/u with urology regarding her bladder. Stands a lot a work. Discussed compression hose.     Past Medical History:  Diagnosis Date  . Chronic cystitis   . Frequent UTI   . Incomplete bladder emptying   . 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 ABDOMINAL HYSTERECTOMY  1995   2/2 delivery complications with 3rd child. Hysterectomy and bladder repair with subsequent appendectomy. L Ovary remain in place   . APPENDECTOMY     2001 due to scar tissue  . BLADDER BOTOX  2014, 2015, 2016  . BREAST BIOPSY Bilateral 1991  . BREAST SURGERY Bilateral 2002   benign mass removed  . COLONOSCOPY  2014  . FOOT SURGERY Bilateral   . LAPAROSCOPIC HYSTERECTOMY  1995  . reconstruction of bladder   1995    2/2 delivery complications with 3rd child    Family History  Problem Relation Age of Onset  . Colon cancer Father        With ostomy in place   . Breast cancer Paternal Aunt 63   Social History   Socioeconomic History  . Marital status: Married    Spouse name: Not on file  . Number of children: 2  . Years of education: Not on file  . Highest education level: Not on file  Occupational History  . Not on file  Social Needs  . Financial resource strain: Not on file  . Food insecurity:    Worry: Not on file    Inability: Not on file  . Transportation needs:    Medical: Not on file    Non-medical: Not on file  Tobacco Use  . Smoking status: Never Smoker    . Smokeless tobacco: Never Used  Substance and Sexual Activity  . Alcohol use: No    Alcohol/week: 0.0 standard drinks  . Drug use: No  . Sexual activity: Yes    Birth control/protection: Surgical  Lifestyle  . Physical activity:    Days per week: Not on file    Minutes per session: Not on file  . Stress: Not on file  Relationships  . Social connections:    Talks on phone: Not on file    Gets together: Not on file    Attends religious service: Not on file    Active member of club or organization: Not on file    Attends meetings of clubs or organizations: Not on file    Relationship status: Not on file  Other Topics Concern  . Not on file  Social History Narrative  . Not on file    Outpatient Encounter Medications as of 08/21/2018  Medication Sig  . estradiol (CLIMARA - DOSED IN MG/24 HR) 0.05 mg/24hr patch 1 patch once a week.  . triamcinolone cream (KENALOG) 0.1 % Apply 1 application topically 2 (two) times daily.  . [DISCONTINUED] Diphenhyd-Hydrocort-Nystatin (FIRST-DUKES MOUTHWASH) SUSP 5cc's tid  swish and spit. (Patient not taking: Reported on 04/20/2018)  . [DISCONTINUED] gabapentin (NEURONTIN) 100 MG capsule Take 1 capsule (100 mg total) by mouth at bedtime as needed.   No facility-administered encounter medications on file as of 08/21/2018.     Review of Systems  Constitutional: Negative for appetite change and unexpected weight change.  HENT: Negative for congestion and sinus pressure.   Respiratory: Negative for cough, chest tightness and shortness of breath.   Cardiovascular: Negative for chest pain and palpitations.  Gastrointestinal: Negative for abdominal pain, diarrhea, nausea and vomiting.  Genitourinary: Negative for difficulty urinating and dysuria.  Musculoskeletal: Negative for joint swelling and myalgias.  Skin: Negative for color change and rash.  Neurological: Negative for dizziness, light-headedness and headaches.  Psychiatric/Behavioral: Negative  for agitation and dysphoric mood.       Objective:    Physical Exam  Constitutional: She appears well-developed and well-nourished. No distress.  HENT:  Nose: Nose normal.  Mouth/Throat: Oropharynx is clear and moist.  Neck: Neck supple. No thyromegaly present.  Cardiovascular: Normal rate and regular rhythm.  Pulmonary/Chest: Breath sounds normal. No respiratory distress. She has no wheezes.  Abdominal: Soft. Bowel sounds are normal. There is no tenderness.  Musculoskeletal: She exhibits no edema or tenderness.  Lymphadenopathy:    She has no cervical adenopathy.  Skin: No rash noted. No erythema.  Psychiatric: She has a normal mood and affect. Her behavior is normal.    BP 100/60 (BP Location: Left Arm, Patient Position: Sitting, Cuff Size: Normal)   Pulse 72   Temp 98.1 F (36.7 C) (Oral)   Resp 18   Wt 132 lb (59.9 kg)   LMP 02/07/1994   SpO2 98%   BMI 21.31 kg/m  Wt Readings from Last 3 Encounters:  08/21/18 132 lb (59.9 kg)  04/20/18 131 lb (59.4 kg)  03/22/18 134 lb (60.8 kg)     Lab Results  Component Value Date   WBC 5.4 04/20/2018   HGB 12.5 04/20/2018   HCT 37.0 04/20/2018   PLT 254 04/20/2018   GLUCOSE 83 08/02/2017   CHOL 180 10/16/2014   TRIG 46.0 10/16/2014   HDL 55.80 10/16/2014   LDLCALC 115 (H) 10/16/2014   ALT 11 08/02/2017   AST 11 08/02/2017   NA 143 08/02/2017   K 3.8 08/02/2017   CL 107 08/02/2017   CREATININE 0.81 08/02/2017   BUN 19 08/02/2017   CO2 31 08/02/2017   TSH 1.62 08/02/2017   HGBA1C 5.6 10/01/2015    Mm 3d Screen Breast Bilateral  Result Date: 08/17/2018 CLINICAL DATA:  Screening. EXAM: DIGITAL SCREENING BILATERAL MAMMOGRAM WITH TOMO AND CAD COMPARISON:  Previous exam(s). ACR Breast Density Category c: The breast tissue is heterogeneously dense, which may obscure small masses. FINDINGS: There are no findings suspicious for malignancy. Images were processed with CAD. IMPRESSION: No mammographic evidence of malignancy. A  result letter of this screening mammogram will be mailed directly to the patient. RECOMMENDATION: Screening mammogram in one year. (Code:SM-B-01Y) BI-RADS CATEGORY  1: Negative. Electronically Signed   By: Kristopher Oppenheim M.D.   On: 08/17/2018 10:02       Assessment & Plan:   Problem List Items Addressed This Visit    Chronic cystitis    Has been followed by Dr Jacqlyn Larsen.  Planning to f/u with urology.        Disorder of bladder function    Followed by urology.       Incomplete bladder emptying    Followed  by urology.       Stress    Increased stress as outlined.  Handling better.  Does not feel needs any further intervention.  Follow.        Relevant Orders   Comprehensive metabolic panel   TSH    Other Visit Diagnoses    Screening cholesterol level    -  Primary   Relevant Orders   Lipid panel       Einar Pheasant, MD

## 2018-08-23 DIAGNOSIS — R350 Frequency of micturition: Secondary | ICD-10-CM | POA: Diagnosis not present

## 2018-08-23 DIAGNOSIS — R339 Retention of urine, unspecified: Secondary | ICD-10-CM | POA: Diagnosis not present

## 2018-08-23 DIAGNOSIS — Z6820 Body mass index (BMI) 20.0-20.9, adult: Secondary | ICD-10-CM | POA: Diagnosis not present

## 2018-08-23 DIAGNOSIS — N3941 Urge incontinence: Secondary | ICD-10-CM | POA: Diagnosis not present

## 2018-08-26 ENCOUNTER — Encounter: Payer: Self-pay | Admitting: Internal Medicine

## 2018-08-26 NOTE — Assessment & Plan Note (Signed)
Followed by urology.   

## 2018-08-26 NOTE — Assessment & Plan Note (Signed)
Increased stress as outlined.  Handling better.  Does not feel needs any further intervention.  Follow.

## 2018-08-26 NOTE — Assessment & Plan Note (Signed)
Has been followed by Dr Jacqlyn Larsen.  Planning to f/u with urology.

## 2018-09-21 DIAGNOSIS — Z23 Encounter for immunization: Secondary | ICD-10-CM | POA: Diagnosis not present

## 2018-10-01 ENCOUNTER — Ambulatory Visit: Payer: BLUE CROSS/BLUE SHIELD

## 2018-10-19 ENCOUNTER — Ambulatory Visit (INDEPENDENT_AMBULATORY_CARE_PROVIDER_SITE_OTHER): Payer: BLUE CROSS/BLUE SHIELD | Admitting: Podiatry

## 2018-10-19 ENCOUNTER — Ambulatory Visit (INDEPENDENT_AMBULATORY_CARE_PROVIDER_SITE_OTHER): Payer: BLUE CROSS/BLUE SHIELD

## 2018-10-19 ENCOUNTER — Encounter: Payer: Self-pay | Admitting: Podiatry

## 2018-10-19 DIAGNOSIS — M7751 Other enthesopathy of right foot: Secondary | ICD-10-CM

## 2018-10-19 DIAGNOSIS — L989 Disorder of the skin and subcutaneous tissue, unspecified: Secondary | ICD-10-CM

## 2018-10-19 MED ORDER — MELOXICAM 15 MG PO TABS: 15.0000 mg | ORAL_TABLET | Freq: Every day | ORAL | 1 refills | Status: AC

## 2018-10-21 NOTE — Progress Notes (Signed)
   HPI: 52 year old female presenting today with a chief complaint of painful lesions noted to the plantar aspect of the left foot that has been present for the past few years. She states it feels as if she is walking on tacks. Walking increases the pain.  She also reports pain to the 1st MPJ of the right foot that has been present for the past year. She reports associated swelling. Walking and bearing weight increases the pain. She has been taking Aleve and soaking the feet in Epsom salt for treatment of the symptoms. Patient is here for further evaluation and treatment.   Past Medical History:  Diagnosis Date  . Chronic cystitis   . Frequent UTI   . Incomplete bladder emptying   . 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  Dermatology: Hyperkeratotic lesions present on the bilateral feet. Pain on palpation with a central nucleated core noted. Skin is warm, dry and supple bilateral lower extremities. Negative for open lesions or macerations.  Vascular: Palpable pedal pulses bilaterally. No edema or erythema noted. Capillary refill within normal limits.  Neurological: Epicritic and protective threshold grossly intact bilaterally.   Musculoskeletal Exam: Pain with palpation to the 2nd MPJ of the right foot. Range of motion within normal limits to all pedal and ankle joints bilateral. Muscle strength 5/5 in all groups bilateral.   Radiographic Exam:  Normal osseous mineralization. Joint spaces preserved. No fracture/dislocation/boney destruction.    Assessment: 1. 2nd MPJ capsulitis right  2. Porokeratosis bilateral    Plan of Care:  1. Patient evaluated. X-Rays reviewed.  2. Injection of 0.5 mLs Celestone Soluspan injected into the 2nd MPJ of the right foot.  3. Prescription for Meloxicam provided to patient.  4. Excisional debridement of keratoic lesion using a chisel blade was performed  without incident. Light dressing applied.  5. Recommended OTC corn and callus remover.  6. Offloading met pads dispensed.  7. Return to clinic in 5 weeks.       Edrick Kins, DPM [facility]  Dr. Edrick Kins, DPM    [address], [place] [address]                Office [phone]  Fax [phone]

## 2018-11-15 DIAGNOSIS — Z79890 Hormone replacement therapy (postmenopausal): Secondary | ICD-10-CM | POA: Diagnosis not present

## 2018-11-20 ENCOUNTER — Encounter: Payer: Self-pay | Admitting: Podiatry

## 2018-11-20 ENCOUNTER — Ambulatory Visit (INDEPENDENT_AMBULATORY_CARE_PROVIDER_SITE_OTHER): Payer: BLUE CROSS/BLUE SHIELD | Admitting: Podiatry

## 2018-11-20 DIAGNOSIS — M7661 Achilles tendinitis, right leg: Secondary | ICD-10-CM

## 2018-11-20 DIAGNOSIS — M7751 Other enthesopathy of right foot: Secondary | ICD-10-CM | POA: Diagnosis not present

## 2018-11-20 DIAGNOSIS — L989 Disorder of the skin and subcutaneous tissue, unspecified: Secondary | ICD-10-CM | POA: Diagnosis not present

## 2018-11-21 DIAGNOSIS — R350 Frequency of micturition: Secondary | ICD-10-CM | POA: Diagnosis not present

## 2018-11-21 NOTE — Progress Notes (Signed)
   HPI: 52 year old female presenting today for follow up evaluation of 2nd MPJ capsulitis and porokeratosis of the feet bilaterally. She states the left foot has improved more than the right but both have made progress. She reports some continued swelling of the right heel. Walking increases the pain. Patient is here for further evaluation and treatment.   Past Medical History:  Diagnosis Date  . Chronic cystitis   . Frequent UTI   . Incomplete bladder emptying   . 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  Dermatology: Hyperkeratotic lesions present on the bilateral feet. Pain on palpation with a central nucleated core noted. Skin is warm, dry and supple bilateral lower extremities. Negative for open lesions or macerations.  Vascular: Palpable pedal pulses bilaterally. No edema or erythema noted. Capillary refill within normal limits.  Neurological: Epicritic and protective threshold grossly intact bilaterally.   Musculoskeletal Exam: Pain with palpation to the 2nd MPJ of the right foot. Range of motion within normal limits to all pedal and ankle joints bilateral. Muscle strength 5/5 in all groups bilateral.   Assessment: 1. 2nd MPJ capsulitis right  2. Porokeratosis bilateral    Plan of Care:  1. Patient evaluated.  2. Appointment with Liliane Channel, Pedorthist, for custom molded orthotics.  3. Excisional debridement of keratoic lesion using a chisel blade was performed without incident. Light dressing applied.  4. Return to clinic as needed.        Edrick Kins, DPM [facility]  Dr. Edrick Kins, DPM    [address], [place] [address]                Office [phone]  Fax [phone]

## 2018-11-27 ENCOUNTER — Other Ambulatory Visit (INDEPENDENT_AMBULATORY_CARE_PROVIDER_SITE_OTHER): Payer: BLUE CROSS/BLUE SHIELD

## 2018-11-27 DIAGNOSIS — F439 Reaction to severe stress, unspecified: Secondary | ICD-10-CM

## 2018-11-27 DIAGNOSIS — Z13220 Encounter for screening for lipoid disorders: Secondary | ICD-10-CM | POA: Diagnosis not present

## 2018-11-27 LAB — COMPREHENSIVE METABOLIC PANEL
ALT: 13 U/L (ref 0–35)
AST: 13 U/L (ref 0–37)
Albumin: 4.2 g/dL (ref 3.5–5.2)
Alkaline Phosphatase: 57 U/L (ref 39–117)
BILIRUBIN TOTAL: 0.8 mg/dL (ref 0.2–1.2)
BUN: 13 mg/dL (ref 6–23)
CO2: 27 meq/L (ref 19–32)
CREATININE: 0.7 mg/dL (ref 0.40–1.20)
Calcium: 8.7 mg/dL (ref 8.4–10.5)
Chloride: 103 mEq/L (ref 96–112)
GFR: 93.08 mL/min (ref >60.00)
GLUCOSE: 88 mg/dL (ref 70–99)
Potassium: 3.9 mEq/L (ref 3.5–5.1)
Sodium: 136 mEq/L (ref 135–145)
Total Protein: 6.6 g/dL (ref 6.0–8.3)

## 2018-11-27 LAB — LIPID PANEL
Cholesterol: 166 mg/dL (ref 0–200)
HDL: 84.9 mg/dL (ref >39.00)
LDL Cholesterol: 72 mg/dL (ref 0–99)
NONHDL: 81.14
Total CHOL/HDL Ratio: 2
Triglycerides: 45 mg/dL (ref 0.0–149.0)
VLDL: 9 mg/dL (ref 0.0–40.0)

## 2018-11-27 LAB — TSH: TSH: 1.88 u[IU]/mL (ref 0.35–4.50)

## 2018-11-28 ENCOUNTER — Ambulatory Visit (INDEPENDENT_AMBULATORY_CARE_PROVIDER_SITE_OTHER): Payer: BLUE CROSS/BLUE SHIELD | Admitting: Orthotics

## 2018-11-28 DIAGNOSIS — M7751 Other enthesopathy of right foot: Secondary | ICD-10-CM

## 2018-11-28 DIAGNOSIS — M7661 Achilles tendinitis, right leg: Secondary | ICD-10-CM

## 2018-11-28 NOTE — Progress Notes (Signed)
Patient came into today to be cast for Custom Foot Orthotics. Upon recommendation of Dr. Amalia Hailey Patient presents with 2nd capsulitis of MTJ R Goals are forefoot offloading Plan vendor Margaretmary Eddy

## 2018-11-29 ENCOUNTER — Ambulatory Visit: Payer: BLUE CROSS/BLUE SHIELD | Admitting: Internal Medicine

## 2018-11-29 DIAGNOSIS — N3941 Urge incontinence: Secondary | ICD-10-CM | POA: Diagnosis not present

## 2018-11-30 ENCOUNTER — Ambulatory Visit (INDEPENDENT_AMBULATORY_CARE_PROVIDER_SITE_OTHER): Payer: BLUE CROSS/BLUE SHIELD | Admitting: Internal Medicine

## 2018-11-30 ENCOUNTER — Encounter: Payer: Self-pay | Admitting: Internal Medicine

## 2018-11-30 DIAGNOSIS — D649 Anemia, unspecified: Secondary | ICD-10-CM | POA: Diagnosis not present

## 2018-11-30 DIAGNOSIS — F439 Reaction to severe stress, unspecified: Secondary | ICD-10-CM | POA: Diagnosis not present

## 2018-11-30 DIAGNOSIS — R232 Flushing: Secondary | ICD-10-CM

## 2018-11-30 DIAGNOSIS — R350 Frequency of micturition: Secondary | ICD-10-CM | POA: Diagnosis not present

## 2018-11-30 MED ORDER — NYSTATIN 100000 UNIT/ML MT SUSP: 5.0000 mL | Freq: Three times a day (TID) | OROMUCOSAL | 0 refills | Status: DC | PRN

## 2018-11-30 NOTE — Progress Notes (Signed)
Patient ID: Rose Bernard, female   DOB: 06/24/1966, 52 y.o.   MRN: [id number]   Subjective:    Patient ID: Rose Bernard, female    DOB: 1966/07/06, 52 y.o.   MRN: [id number]  HPI  Patient here for a scheduled follow up.  She sees gyn.  Last appt 11/15/18 - Dr Leafy Ro.  Remains on estrogen therapy.  Changing to oral estrogen secondary to irritation from estrogen patch.  She is seeing urology for her urinary issues.  Receiving botox.  Also recently evaluated by podiatry.  Planning for orthotics.  Tries to stay active.  No chest pain.  No sob.  No acid reflux.  No abdominal pain.  Bowels moving.     Past Medical History:  Diagnosis Date  . Chronic cystitis   . Frequent UTI   . Incomplete bladder emptying   . 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 ABDOMINAL HYSTERECTOMY  1995   2/2 delivery complications with 3rd child. Hysterectomy and bladder repair with subsequent appendectomy. L Ovary remain in place   . APPENDECTOMY     2001 due to scar tissue  . BLADDER BOTOX  2014, 2015, 2016  . BREAST BIOPSY Bilateral 1991  . BREAST SURGERY Bilateral 2002   benign mass removed  . COLONOSCOPY  2014  . FOOT SURGERY Bilateral   . LAPAROSCOPIC HYSTERECTOMY  1995  . reconstruction of bladder   1995    2/2 delivery complications with 3rd child    Family History  Problem Relation Age of Onset  . Colon cancer Father        With ostomy in place   . Breast cancer Paternal Aunt 45   Social History   Socioeconomic History  . Marital status: Married    Spouse name: Not on file  . Number of children: 2  . Years of education: Not on file  . Highest education level: Not on file  Occupational History  . Not on file  Social Needs  . Financial resource strain: Not on file  . Food insecurity:    Worry: Not on file    Inability: Not on file  . Transportation needs:    Medical: Not on file    Non-medical: Not on file    Tobacco Use  . Smoking status: Never Smoker  . Smokeless tobacco: Never Used  Substance and Sexual Activity  . Alcohol use: No    Alcohol/week: 0.0 standard drinks  . Drug use: No  . Sexual activity: Yes    Birth control/protection: Surgical  Lifestyle  . Physical activity:    Days per week: Not on file    Minutes per session: Not on file  . Stress: Not on file  Relationships  . Social connections:    Talks on phone: Not on file    Gets together: Not on file    Attends religious service: Not on file    Active member of club or organization: Not on file    Attends meetings of clubs or organizations: Not on file    Relationship status: Not on file  Other Topics Concern  . Not on file  Social History Narrative  . Not on file    Outpatient Encounter Medications as of 11/30/2018  Medication Sig  . [DISCONTINUED] estradiol (CLIMARA - DOSED IN MG/24 HR) 0.05 mg/24hr patch 1 patch once a week.  . nystatin (MYCOSTATIN) 100000 UNIT/ML suspension Take 5 mLs (500,000 Units total)  by mouth 3 (three) times daily as needed.   No facility-administered encounter medications on file as of 11/30/2018.     Review of Systems  Constitutional: Negative for appetite change and unexpected weight change.  HENT: Negative for congestion and sinus pressure.   Respiratory: Negative for cough, chest tightness and shortness of breath.   Cardiovascular: Negative for chest pain, palpitations and leg swelling.  Gastrointestinal: Negative for abdominal pain, diarrhea, nausea and vomiting.  Genitourinary: Negative for difficulty urinating and dysuria.  Musculoskeletal: Negative for joint swelling and myalgias.  Skin: Negative for color change and rash.  Neurological: Negative for dizziness, light-headedness and headaches.  Psychiatric/Behavioral: Negative for agitation and dysphoric mood.       Objective:    Physical Exam Constitutional:      General: She is not in acute distress.    Appearance:  Normal appearance.  HENT:     Nose: Nose normal. No congestion.     Mouth/Throat:     Pharynx: No oropharyngeal exudate or posterior oropharyngeal erythema.  Neck:     Musculoskeletal: Neck supple. No muscular tenderness.     Thyroid: No thyromegaly.  Cardiovascular:     Rate and Rhythm: Normal rate and regular rhythm.  Pulmonary:     Effort: No respiratory distress.     Breath sounds: Normal breath sounds. No wheezing.  Abdominal:     General: Bowel sounds are normal.     Palpations: Abdomen is soft.     Tenderness: There is no abdominal tenderness.  Musculoskeletal:        General: No swelling or tenderness.  Lymphadenopathy:     Cervical: No cervical adenopathy.  Skin:    Findings: No erythema or rash.  Neurological:     Mental Status: She is alert.  Psychiatric:        Mood and Affect: Mood normal.        Thought Content: Thought content normal.     BP 112/60 (BP Location: Left Arm, Patient Position: Sitting, Cuff Size: Normal)   Pulse 66   Temp (!) 97.3 F (36.3 C) (Oral)   Resp 16   Wt 131 lb (59.4 kg)   LMP 02/07/1994   SpO2 99%   BMI 21.14 kg/m  Wt Readings from Last 3 Encounters:  11/30/18 131 lb (59.4 kg)  08/21/18 132 lb (59.9 kg)  04/20/18 131 lb (59.4 kg)     Lab Results  Component Value Date   WBC 5.4 04/20/2018   HGB 12.5 04/20/2018   HCT 37.0 04/20/2018   PLT 254 04/20/2018   GLUCOSE 88 11/27/2018   CHOL 166 11/27/2018   TRIG 45.0 11/27/2018   HDL 84.90 11/27/2018   LDLCALC 72 11/27/2018   ALT 13 11/27/2018   AST 13 11/27/2018   NA 136 11/27/2018   K 3.9 11/27/2018   CL 103 11/27/2018   CREATININE 0.70 11/27/2018   BUN 13 11/27/2018   CO2 27 11/27/2018   TSH 1.88 11/27/2018   HGBA1C 5.6 10/01/2015    Mm 3d Screen Breast Bilateral  Result Date: 08/17/2018 CLINICAL DATA:  Screening. EXAM: DIGITAL SCREENING BILATERAL MAMMOGRAM WITH TOMO AND CAD COMPARISON:  Previous exam(s). ACR Breast Density Category c: The breast tissue is  heterogeneously dense, which may obscure small masses. FINDINGS: There are no findings suspicious for malignancy. Images were processed with CAD. IMPRESSION: No mammographic evidence of malignancy. A result letter of this screening mammogram will be mailed directly to the patient. RECOMMENDATION: Screening mammogram in one year. (Code:SM-B-01Y)  BI-RADS CATEGORY  1: Negative. Electronically Signed   By: Kristopher Oppenheim M.D.   On: 08/17/2018 10:02       Assessment & Plan:   Problem List Items Addressed This Visit    Anemia    Follow cbc.        Hot flashes    Seeing gyn.  On estrogen.        Stress    Increased stress.  Handling things relatively well.  Follow.        Urinary frequency    Followed by urology.  Receiving botox.            Einar Pheasant, MD

## 2018-12-04 ENCOUNTER — Ambulatory Visit: Payer: BLUE CROSS/BLUE SHIELD | Admitting: Internal Medicine

## 2018-12-08 ENCOUNTER — Encounter: Payer: Self-pay | Admitting: Internal Medicine

## 2018-12-08 NOTE — Assessment & Plan Note (Signed)
Seeing gyn.  On estrogen.

## 2018-12-08 NOTE — Assessment & Plan Note (Signed)
Increased stress.  Handling things relatively well.  Follow.   

## 2018-12-08 NOTE — Assessment & Plan Note (Signed)
Followed by urology.  Receiving botox.

## 2018-12-08 NOTE — Assessment & Plan Note (Signed)
Follow cbc.  

## 2018-12-19 DIAGNOSIS — N3943 Post-void dribbling: Secondary | ICD-10-CM | POA: Diagnosis not present

## 2018-12-26 ENCOUNTER — Ambulatory Visit: Payer: BLUE CROSS/BLUE SHIELD | Admitting: Orthotics

## 2018-12-26 DIAGNOSIS — M7751 Other enthesopathy of right foot: Secondary | ICD-10-CM

## 2018-12-26 DIAGNOSIS — M7661 Achilles tendinitis, right leg: Secondary | ICD-10-CM

## 2018-12-26 NOTE — Progress Notes (Signed)
Patient came in today to pick up custom made foot orthotics.  The goals were accomplished and the patient reported no dissatisfaction with said orthotics.  Patient was advised of breakin period and how to report any issues. 

## 2019-04-05 ENCOUNTER — Encounter: Payer: BLUE CROSS/BLUE SHIELD | Admitting: Internal Medicine

## 2019-07-16 ENCOUNTER — Other Ambulatory Visit: Payer: Self-pay

## 2019-07-18 ENCOUNTER — Ambulatory Visit (INDEPENDENT_AMBULATORY_CARE_PROVIDER_SITE_OTHER): Payer: BC Managed Care – PPO | Admitting: Internal Medicine

## 2019-07-18 ENCOUNTER — Other Ambulatory Visit (HOSPITAL_COMMUNITY)
Admission: RE | Admit: 2019-07-18 | Discharge: 2019-07-18 | Disposition: A | Discharge status: Home / Self Care | Payer: BC Managed Care – PPO | Source: Ambulatory Visit | Attending: Internal Medicine | Admitting: Internal Medicine

## 2019-07-18 ENCOUNTER — Encounter: Payer: Self-pay | Admitting: Internal Medicine

## 2019-07-18 ENCOUNTER — Other Ambulatory Visit: Payer: Self-pay

## 2019-07-18 VITALS — BP 118/64 | HR 63 | Temp 97.6°F | Resp 16 | Ht 66.0 in | Wt 134.0 lb

## 2019-07-18 DIAGNOSIS — D649 Anemia, unspecified: Secondary | ICD-10-CM

## 2019-07-18 DIAGNOSIS — Z0000 Encounter for general adult medical examination without abnormal findings: Secondary | ICD-10-CM | POA: Diagnosis not present

## 2019-07-18 DIAGNOSIS — Z124 Encounter for screening for malignant neoplasm of cervix: Secondary | ICD-10-CM | POA: Diagnosis not present

## 2019-07-18 DIAGNOSIS — Z1231 Encounter for screening mammogram for malignant neoplasm of breast: Secondary | ICD-10-CM

## 2019-07-18 DIAGNOSIS — R519 Headache, unspecified: Secondary | ICD-10-CM

## 2019-07-18 DIAGNOSIS — F439 Reaction to severe stress, unspecified: Secondary | ICD-10-CM

## 2019-07-18 DIAGNOSIS — R51 Headache: Secondary | ICD-10-CM

## 2019-07-18 NOTE — Progress Notes (Signed)
Patient ID: Rose Bernard, female   DOB: 1965/12/18, 53 y.o.   MRN: [id number]   Subjective:    Patient ID: Rose Bernard, female    DOB: [birth year], 53 y.o.   MRN: [id number]  HPI  Patient here for her physical exam.  She reports she is doing relatively well. Does report still having issues with her bladder.  Followed by urology.  Receiving botox.  Sees Dr Leafy Ro.  On estrogen.  Is working. Off weekends right now.  No chest pain.  Breathing stable.  Eating.  No nausea or vomiting.  Bowels moving.  Some headache. Discussed with her today.  She was questioning if could be related to sinus congestion and her neck.  Declines any further evaluation or testing at this time.      Past Medical History:  Diagnosis Date  . Chronic cystitis   . Frequent UTI   . Incomplete bladder emptying   . 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 ABDOMINAL HYSTERECTOMY  1995   2/2 delivery complications with 3rd child. Hysterectomy and bladder repair with subsequent appendectomy. L Ovary remain in place   . APPENDECTOMY     2001 due to scar tissue  . BLADDER BOTOX  2014, 2015, 2016  . BREAST BIOPSY Bilateral 1991  . BREAST SURGERY Bilateral 2002   benign mass removed  . COLONOSCOPY  2014  . FOOT SURGERY Bilateral   . LAPAROSCOPIC HYSTERECTOMY  1995  . reconstruction of bladder   1995    2/2 delivery complications with 3rd child    Family History  Problem Relation Age of Onset  . Colon cancer Father        With ostomy in place   . Breast cancer Paternal Aunt 44   Social History   Socioeconomic History  . Marital status: Married    Spouse name: Not on file  . Number of children: 2  . Years of education: Not on file  . Highest education level: Not on file  Occupational History  . Not on file  Social Needs  . Financial resource strain: Not on file  . Food insecurity    Worry: Not on file    Inability: Not on file  .  Transportation needs    Medical: Not on file    Non-medical: Not on file  Tobacco Use  . Smoking status: Never Smoker  . Smokeless tobacco: Never Used  Substance and Sexual Activity  . Alcohol use: No    Alcohol/week: 0.0 standard drinks  . Drug use: No  . Sexual activity: Yes    Birth control/protection: Surgical  Lifestyle  . Physical activity    Days per week: Not on file    Minutes per session: Not on file  . Stress: Not on file  Relationships  . Social Herbalist on phone: Not on file    Gets together: Not on file    Attends religious service: Not on file    Active member of club or organization: Not on file    Attends meetings of clubs or organizations: Not on file    Relationship status: Not on file  Other Topics Concern  . Not on file  Social History Narrative  . Not on file    Outpatient Encounter Medications as of 07/18/2019  Medication Sig  . estradiol (ESTRACE) 2 MG tablet Take by mouth.  . [DISCONTINUED] nystatin (MYCOSTATIN) 100000 UNIT/ML suspension Take  5 mLs (500,000 Units total) by mouth 3 (three) times daily as needed.   No facility-administered encounter medications on file as of 07/18/2019.     Review of Systems  Constitutional: Negative for appetite change and unexpected weight change.  HENT: Negative for congestion and sinus pressure.   Eyes: Negative for pain and visual disturbance.  Respiratory: Negative for cough, chest tightness and shortness of breath.   Cardiovascular: Negative for chest pain, palpitations and leg swelling.  Gastrointestinal: Negative for abdominal pain, diarrhea, nausea and vomiting.  Genitourinary: Negative for difficulty urinating and dysuria.  Musculoskeletal: Negative for joint swelling and myalgias.  Skin: Negative for color change and rash.  Neurological: Positive for headaches. Negative for dizziness.  Hematological: Negative for adenopathy. Does not bruise/bleed easily.  Psychiatric/Behavioral: Negative  for agitation and dysphoric mood.       Objective:    Physical Exam Constitutional:      General: She is not in acute distress.    Appearance: Normal appearance. She is well-developed.  HENT:     Right Ear: External ear normal.     Left Ear: External ear normal.  Eyes:     General: No scleral icterus.       Right eye: No discharge.        Left eye: No discharge.     Conjunctiva/sclera: Conjunctivae normal.  Neck:     Musculoskeletal: Neck supple. No muscular tenderness.     Thyroid: No thyromegaly.  Cardiovascular:     Rate and Rhythm: Normal rate and regular rhythm.  Pulmonary:     Effort: No tachypnea, accessory muscle usage or respiratory distress.     Breath sounds: Normal breath sounds. No decreased breath sounds or wheezing.  Chest:     Breasts:        Right: No inverted nipple, mass, nipple discharge or tenderness (no axillary adenopathy).        Left: No inverted nipple, mass, nipple discharge or tenderness (no axilarry adenopathy).  Abdominal:     General: Bowel sounds are normal.     Palpations: Abdomen is soft.     Tenderness: There is no abdominal tenderness.  Genitourinary:    Comments: Normal external genitalia.  Vaginal vault without lesions.  S/p hysterectomy.  Pap smear performed of vaginal cuff.  Could not appreciate any adnexal masses or tenderness.   Musculoskeletal:        General: No swelling or tenderness.  Lymphadenopathy:     Cervical: No cervical adenopathy.  Skin:    Findings: No erythema or rash.  Neurological:     Mental Status: She is alert and oriented to person, place, and time.  Psychiatric:        Mood and Affect: Mood normal.        Behavior: Behavior normal.     BP 118/64   Pulse 63   Temp 97.6 F (36.4 C) (Oral)   Resp 16   Ht 5\' 6"  (1.676 m)   Wt 134 lb (60.8 kg)   LMP 02/07/1994   SpO2 99%   BMI 21.63 kg/m  Wt Readings from Last 3 Encounters:  07/18/19 134 lb (60.8 kg)  11/30/18 131 lb (59.4 kg)  08/21/18 132 lb  (59.9 kg)     Lab Results  Component Value Date   WBC 5.4 04/20/2018   HGB 12.5 04/20/2018   HCT 37.0 04/20/2018   PLT 254 04/20/2018   GLUCOSE 88 11/27/2018   CHOL 166 11/27/2018   TRIG 45.0 11/27/2018  HDL 84.90 11/27/2018   LDLCALC 72 11/27/2018   ALT 13 11/27/2018   AST 13 11/27/2018   NA 136 11/27/2018   K 3.9 11/27/2018   CL 103 11/27/2018   CREATININE 0.70 11/27/2018   BUN 13 11/27/2018   CO2 27 11/27/2018   TSH 1.88 11/27/2018   HGBA1C 5.6 10/01/2015    Mm 3d Screen Breast Bilateral  Result Date: 08/17/2018 CLINICAL DATA:  Screening. EXAM: DIGITAL SCREENING BILATERAL MAMMOGRAM WITH TOMO AND CAD COMPARISON:  Previous exam(s). ACR Breast Density Category c: The breast tissue is heterogeneously dense, which may obscure small masses. FINDINGS: There are no findings suspicious for malignancy. Images were processed with CAD. IMPRESSION: No mammographic evidence of malignancy. A result letter of this screening mammogram will be mailed directly to the patient. RECOMMENDATION: Screening mammogram in one year. (Code:SM-B-01Y) BI-RADS CATEGORY  1: Negative. Electronically Signed   By: Kristopher Oppenheim M.D.   On: 08/17/2018 10:02       Assessment & Plan:   Problem List Items Addressed This Visit    Anemia    Follow cbc.        Headache    Persistent intermittent headaches.  Some neck discomfort.  Some sinus congestion.  Discussed saline flushes and steroid nasal spray.  Keep neck level when sleeping - discussed cervical pillow.  She declines any further testing or evaluation at this time.  Will notify me if she changes her mind.        Health care maintenance    Physical today 07/18/19.  PAP 07/18/19.  Colonoscopy 07/2018 - diverticulosis and internal hemorrhoids.  Mammogram 08/17/18 - Birads I.        Stress    Discussed with her today.  Overall she feels she is handling things relatively well.  Follow.         Other Visit Diagnoses    Visit for screening mammogram    -   Primary   Relevant Orders   MM 3D SCREEN BREAST BILATERAL   Cervical cancer screening       Relevant Orders   Cytology - PAP( [place])       Einar Pheasant, MD

## 2019-07-19 ENCOUNTER — Telehealth: Payer: Self-pay | Admitting: *Deleted

## 2019-07-19 ENCOUNTER — Telehealth: Payer: Self-pay | Admitting: Internal Medicine

## 2019-07-19 DIAGNOSIS — N3020 Other chronic cystitis without hematuria: Secondary | ICD-10-CM

## 2019-07-19 DIAGNOSIS — N319 Neuromuscular dysfunction of bladder, unspecified: Secondary | ICD-10-CM

## 2019-07-19 NOTE — Telephone Encounter (Signed)
See other message

## 2019-07-19 NOTE — Telephone Encounter (Signed)
Patient saw PCP in office no notes concerning medication.

## 2019-07-19 NOTE — Telephone Encounter (Signed)
Copied from [place] [id number]. Topic: Referral - Request for Referral >> Jul 19, 2019  4:20 PM Rayann Heman wrote: Has patient seen PCP for this complaint? Yes Referral for which specialty:Urology  Preferred provider/office: any previous office cancels appointments all the time   Reason for referral: Botox

## 2019-07-19 NOTE — Telephone Encounter (Signed)
Patient stated that she would like to see another urologist because she has a hard time getting in with her current urologist. States that she was due for botox in July. Regarding the macrobid, Dr Jacqlyn Larsen prescribed this when she was seeing him to have on hand in case infection. Stated she is not having any sx right now but thought she may need it. Advised that I would send message over and get referral to another urologist.

## 2019-07-19 NOTE — Telephone Encounter (Signed)
Please clarify what she needs.  She sees urology regularly.  I have not been prescribing macrobid.  Also, see other message.  Does she need a new referral?

## 2019-07-19 NOTE — Telephone Encounter (Signed)
Medication Refill: nitrofurantoin, macrocrystal-monohydrate, (MACROBID) 100 MG capsule [[id number]]    Pharmacy:  [facility] [address], [place] [phone] (Phone) [phone] (Fax)

## 2019-07-19 NOTE — Telephone Encounter (Signed)
She has been seeing urology regularly.  She receives botox.  Does she need a new referral?  See other message.

## 2019-07-20 NOTE — Telephone Encounter (Signed)
Need to know where she prefers to go.  I can refer her back to [facility] (where she saw Dr Jacqlyn Larsen) or I can refer her to [facility].  Regarding the abx, she can let us know if any problems prior to seeing urology.

## 2019-07-21 ENCOUNTER — Encounter: Payer: Self-pay | Admitting: Internal Medicine

## 2019-07-21 NOTE — Assessment & Plan Note (Signed)
Follow cbc.  

## 2019-07-21 NOTE — Assessment & Plan Note (Signed)
Physical today 07/18/19.  PAP 07/18/19.  Colonoscopy 07/2018 - diverticulosis and internal hemorrhoids.  Mammogram 08/17/18 - Birads I.

## 2019-07-21 NOTE — Assessment & Plan Note (Signed)
Discussed with her today.  Overall she feels she is handling things relatively well.  Follow.

## 2019-07-21 NOTE — Assessment & Plan Note (Signed)
Persistent intermittent headaches.  Some neck discomfort.  Some sinus congestion.  Discussed saline flushes and steroid nasal spray.  Keep neck level when sleeping - discussed cervical pillow.  She declines any further testing or evaluation at this time.  Will notify me if she changes her mind.

## 2019-07-22 NOTE — Telephone Encounter (Signed)
Pt returned call. Please call back.

## 2019-07-22 NOTE — Telephone Encounter (Signed)
LMTCB

## 2019-07-23 LAB — CYTOLOGY - PAP
Adequacy: ABSENT
Diagnosis: NEGATIVE
HPV: NOT DETECTED

## 2019-07-23 NOTE — Telephone Encounter (Signed)
LMTCB

## 2019-07-23 NOTE — Telephone Encounter (Signed)
Pt is returning call Please call back

## 2019-07-24 NOTE — Telephone Encounter (Signed)
Patient would like to be referred to [place].

## 2019-07-24 NOTE — Telephone Encounter (Signed)
Order placed for urology referral.  

## 2019-08-28 ENCOUNTER — Ambulatory Visit: Payer: BLUE CROSS/BLUE SHIELD | Admitting: Urology

## 2019-09-09 DIAGNOSIS — Z79899 Other long term (current) drug therapy: Secondary | ICD-10-CM | POA: Diagnosis not present

## 2019-09-09 DIAGNOSIS — M549 Dorsalgia, unspecified: Secondary | ICD-10-CM | POA: Diagnosis not present

## 2019-09-09 DIAGNOSIS — R079 Chest pain, unspecified: Secondary | ICD-10-CM | POA: Diagnosis not present

## 2019-09-09 DIAGNOSIS — Z880 Allergy status to penicillin: Secondary | ICD-10-CM | POA: Diagnosis not present

## 2019-09-09 DIAGNOSIS — R0789 Other chest pain: Secondary | ICD-10-CM | POA: Diagnosis not present

## 2019-09-09 DIAGNOSIS — Z87440 Personal history of urinary (tract) infections: Secondary | ICD-10-CM | POA: Diagnosis not present

## 2019-09-13 ENCOUNTER — Encounter: Payer: Self-pay | Admitting: Internal Medicine

## 2019-09-13 ENCOUNTER — Ambulatory Visit (INDEPENDENT_AMBULATORY_CARE_PROVIDER_SITE_OTHER): Payer: BC Managed Care – PPO | Admitting: Internal Medicine

## 2019-09-13 DIAGNOSIS — D649 Anemia, unspecified: Secondary | ICD-10-CM

## 2019-09-13 DIAGNOSIS — R079 Chest pain, unspecified: Secondary | ICD-10-CM | POA: Diagnosis not present

## 2019-09-13 DIAGNOSIS — R109 Unspecified abdominal pain: Secondary | ICD-10-CM

## 2019-09-13 NOTE — Progress Notes (Signed)
Patient ID: Rose Bernard, female   DOB: September 14, 1966, 53 y.o.   MRN: [id number]   Virtual Visit via telephone Note  This visit type was conducted due to national recommendations for restrictions regarding the COVID-19 pandemic (e.g. social distancing).  This format is felt to be most appropriate for this patient at this time.  All issues noted in this document were discussed and addressed.  No physical exam was performed (except for noted visual exam findings with Video Visits).   I connected with [name] by telephone and verified that I am speaking with the correct person using two identifiers. Location patient: home Location provider: work  Persons participating in the telephone visit: patient, provider  I discussed the limitations, risks, security and privacy concerns of performing an evaluation and management service by telephone and the availability of in person appointments. The patient expressed understanding and agreed to proceed.    Reason for visit: scheduled follow up.   HPI: Was seen in ER 09/09/19 - for chest pain.  Troponin negative x 2.  Described chest and upper back pain.  Discharged.  Took TUMS.  Eased.  No nausea or vomiting.  Started nexium.  Feels this is helping.  No chest pain with increased activity or exertion.  No pain with deep breath.  No sob.  Bowels moving.  No fever.  No cough or congestion.     ROS: See pertinent positives and negatives per HPI.  Past Medical History:  Diagnosis Date  . Chronic cystitis   . Frequent UTI   . Incomplete bladder emptying   . 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 ABDOMINAL HYSTERECTOMY  1995   2/2 delivery complications with 3rd child. Hysterectomy and bladder repair with subsequent appendectomy. L Ovary remain in place   . APPENDECTOMY     2001 due to scar tissue  . BLADDER BOTOX  2014, 2015, 2016  . BREAST BIOPSY Bilateral 1991  . BREAST  SURGERY Bilateral 2002   benign mass removed  . COLONOSCOPY  2014  . FOOT SURGERY Bilateral   . LAPAROSCOPIC HYSTERECTOMY  1995  . reconstruction of bladder   1995    2/2 delivery complications with 3rd child     Family History  Problem Relation Age of Onset  . Colon cancer Father        With ostomy in place   . Breast cancer Paternal Aunt 56    SOCIAL HX: reviewed.    Current Outpatient Medications:  .  estradiol (ESTRACE) 2 MG tablet, Take by mouth., Disp: , Rfl:  .  omeprazole (PRILOSEC) 20 MG capsule, Take 20 mg by mouth daily., Disp: , Rfl:   EXAM:  GENERAL: alert.  Sounds to be in no acute distress.  Answering questions appropriately.    PSYCH/NEURO: pleasant and cooperative, no obvious depression or anxiety, speech and thought processing grossly intact  ASSESSMENT AND PLAN:  Discussed the following assessment and plan:  Anemia Follow cbc.   Abdominal pain Chest pain and abdominal pain as outlined.  TUMS improved.  Taking nexium now.  Feels symptom have improved.  Will obtain abdominal ultrasound.  Further w/up pending results.    Chest pain Recently evaluated at ER.  Reviewed ER note.  Troponin negative.  Persistent pain.  Improved with nexium. Discussed further cardiac w/up.  She declines.  No pain with increased activity or exertion.  Check abdominal ultrasound.  If any recurring pain, she is  to be reevaluated.      I discussed the assessment and treatment plan with the patient. The patient was provided an opportunity to ask questions and all were answered. The patient agreed with the plan and demonstrated an understanding of the instructions.   The patient was advised to call back or seek an in-person evaluation if the symptoms worsen or if the condition fails to improve as anticipated.  I provided 17 minutes of non-face-to-face time during this encounter.   Einar Pheasant, MD

## 2019-09-15 DIAGNOSIS — R109 Unspecified abdominal pain: Secondary | ICD-10-CM | POA: Insufficient documentation

## 2019-09-15 DIAGNOSIS — R079 Chest pain, unspecified: Secondary | ICD-10-CM | POA: Insufficient documentation

## 2019-09-15 NOTE — Assessment & Plan Note (Signed)
Follow cbc.  

## 2019-09-15 NOTE — Assessment & Plan Note (Signed)
Chest pain and abdominal pain as outlined.  TUMS improved.  Taking nexium now.  Feels symptom have improved.  Will obtain abdominal ultrasound.  Further w/up pending results.

## 2019-09-15 NOTE — Assessment & Plan Note (Signed)
Recently evaluated at ER.  Reviewed ER note.  Troponin negative.  Persistent pain.  Improved with nexium. Discussed further cardiac w/up.  She declines.  No pain with increased activity or exertion.  Check abdominal ultrasound.  If any recurring pain, she is to be reevaluated.

## 2019-09-17 ENCOUNTER — Telehealth: Payer: Self-pay

## 2019-09-17 DIAGNOSIS — R1310 Dysphagia, unspecified: Secondary | ICD-10-CM

## 2019-09-17 NOTE — Telephone Encounter (Signed)
Copied from [place] [id number]. Topic: Referral - Request for Referral >> Sep 17, 2019  1:21 PM [name], Rose Bernard wrote: Pt requesting to have Swallowing test done at the same time/same day as gallbaldder scan.  Please advise.

## 2019-09-17 NOTE — Telephone Encounter (Signed)
Need more information.  Swallowing problems?  Need specific symptoms.

## 2019-09-18 NOTE — Telephone Encounter (Signed)
She is having some trouble swallowing depending on what she eats. She feels like her food is not going all the way down some times.. Her mom suggested having this done because she has the same problem. Patient stated nothing urgent or significant acute symptoms but would like to have it done.

## 2019-09-18 NOTE — Telephone Encounter (Signed)
LMTCB

## 2019-09-18 NOTE — Telephone Encounter (Signed)
We can do a swallowing evaluation or given her symptoms, I can refer her to GI for evaluation and question of need for EGD - to look and see what is going on.  If narrowing, etc - may need dilating, etc.  Just let me know either way.

## 2019-09-18 NOTE — Telephone Encounter (Signed)
Pt returned [name]'s call / please advise

## 2019-09-19 NOTE — Telephone Encounter (Signed)
Patient would like to proceed with swallowing evaluation. Does not want to see GI

## 2019-09-19 NOTE — Telephone Encounter (Signed)
I want to order barium swallow with [facility].  What test code do I use?  Thanks

## 2019-09-20 NOTE — Telephone Encounter (Signed)
[id number] (DG esophagus)

## 2019-09-20 NOTE — Telephone Encounter (Signed)
Order placed for swallowing evaluation.

## 2019-10-03 ENCOUNTER — Ambulatory Visit: Payer: BC Managed Care – PPO

## 2019-10-03 ENCOUNTER — Other Ambulatory Visit: Payer: BC Managed Care – PPO

## 2019-10-09 ENCOUNTER — Other Ambulatory Visit: Payer: Self-pay

## 2019-10-09 ENCOUNTER — Ambulatory Visit (INDEPENDENT_AMBULATORY_CARE_PROVIDER_SITE_OTHER): Payer: BC Managed Care – PPO | Admitting: Urology

## 2019-10-09 ENCOUNTER — Encounter: Payer: Self-pay | Admitting: Urology

## 2019-10-09 VITALS — BP 117/68 | HR 64 | Ht 66.0 in | Wt 138.0 lb

## 2019-10-09 DIAGNOSIS — Z87448 Personal history of other diseases of urinary system: Secondary | ICD-10-CM

## 2019-10-09 DIAGNOSIS — N3020 Other chronic cystitis without hematuria: Secondary | ICD-10-CM

## 2019-10-09 DIAGNOSIS — N3941 Urge incontinence: Secondary | ICD-10-CM | POA: Diagnosis not present

## 2019-10-09 DIAGNOSIS — N3281 Overactive bladder: Secondary | ICD-10-CM | POA: Diagnosis not present

## 2019-10-09 MED ORDER — LORAZEPAM 1 MG PO TABS: 1.0000 mg | ORAL_TABLET | Freq: Three times a day (TID) | ORAL | 0 refills | Status: DC

## 2019-10-09 NOTE — Progress Notes (Signed)
10/09/2019 5:31 PM   [name] 02/19/1966 [id number]  Referring provider: Einar Pheasant, [id number] [place],  [place] [id number]  Chief Complaint  Patient presents with  . Cystitis    New Patient    HPI: 53 year old female who was previously followed by Dr. Jacqlyn Larsen and more recently Dr. Westly Pam who presents today to establish care.  She lives here in [place] and this is more convenient for her.  She is a long urologic history which is reviewed extensively in care everywhere.  She has a personal history of a left vesicoureteral reflux and underwent cross trigonal reimplantation in the mid 90s with Dr. Freddrick March.    She has a personal history of kidney stones and spontaneously passed stone about 2 years ago.  Is not having flank pain or stone episodes since.  She also has a remote history of recurrent urinary tract infections.  She was previously on suppressive antibiotics but no longer takes these.  She not had a recent infection in the past year plus.  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  I do not have documentation of this but she believes that she tried multiple medications and failed.  She is been managed with Botox every 5 to 6 months since 2013.  Her injections were initially given in the operating but more recently in the clinic.  Her last injection was 11/2018.  She is overdue for her injection.  She reports that over the past 1 to 2 months, she has had increasing daytime frequency, urgency, and occasional episodes of urge incontinence.  This is worsening as time goes on.  No dysuria or gross hematuria.  She is interested in resuming Botox.   PMH: Past Medical History:  Diagnosis Date  . Chronic cystitis   . Frequent UTI   . Incomplete bladder emptying   . 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  ABDOMINAL HYSTERECTOMY  1995   2/2 delivery complications with 3rd child. Hysterectomy and bladder repair with subsequent appendectomy. L Ovary remain in place   . APPENDECTOMY     2001 due to scar tissue  . BLADDER BOTOX  2014, 2015, 2016  . BREAST BIOPSY Bilateral 1991  . BREAST SURGERY Bilateral 2002   benign mass removed  . COLONOSCOPY  2014  . FOOT SURGERY Bilateral   . LAPAROSCOPIC HYSTERECTOMY  1995  . reconstruction of bladder   1995    2/2 delivery complications with 3rd child     Home Medications:  Allergies as of 10/09/2019      Reactions   Penicillin V Potassium       Medication List       Accurate as of October 09, 2019  5:31 PM. If you have any questions, ask your nurse or doctor.        STOP taking these medications   omeprazole 20 MG capsule Commonly known as: PRILOSEC Stopped by: Hollice Espy, MD     TAKE these medications   estradiol 2 MG tablet Commonly known as: ESTRACE Take by mouth.   LORazepam 1 MG tablet Commonly known as: Ativan Take 1 tablet (1 mg total) by mouth every 8 (eight) hours. Started by: Hollice Espy, MD       Allergies:  Allergies  Allergen Reactions  . Penicillin V Potassium     Family History: Family History  Problem  Relation Age of Onset  . Colon cancer Father        With ostomy in place   . Breast cancer Paternal Aunt 56    Social History:  reports that she has never smoked. She has never used smokeless tobacco. She reports that she does not drink alcohol or use drugs.  ROS: UROLOGY Frequent Urination?: Yes Hard to postpone urination?: Yes Burning/pain with urination?: No Get up at night to urinate?: No Leakage of urine?: Yes Urine stream starts and stops?: No Trouble starting stream?: No Do you have to strain to urinate?: No Blood in urine?: No Urinary tract infection?: No Sexually transmitted disease?: No Injury to kidneys or bladder?: No Painful intercourse?: No Weak stream?: No Currently  pregnant?: No Vaginal bleeding?: No Last menstrual period?: n  Gastrointestinal Nausea?: No Vomiting?: No Indigestion/heartburn?: No Diarrhea?: No Constipation?: No  Constitutional Fever: No Night sweats?: No Weight loss?: No Fatigue?: No  Skin Skin rash/lesions?: No Itching?: No  Eyes Blurred vision?: No Double vision?: No  Ears/Nose/Throat Sore throat?: No Sinus problems?: No  Hematologic/Lymphatic Swollen glands?: No Easy bruising?: No  Cardiovascular Leg swelling?: No Chest pain?: No  Respiratory Cough?: No Shortness of breath?: No  Endocrine Excessive thirst?: No  Musculoskeletal Back pain?: No Joint pain?: No  Neurological Headaches?: No Dizziness?: No  Psychologic Depression?: No Anxiety?: No  Physical Exam: BP 117/68   Pulse 64   Ht 5\' 6"  (1.676 m)   Wt 138 lb (62.6 kg)   LMP 02/07/1994   BMI 22.27 kg/m   Constitutional:  Alert and oriented, No acute distress. HEENT: [place] AT, moist mucus membranes.  Trachea midline, no masses. Cardiovascular: No clubbing, cyanosis, or edema. Respiratory: Normal respiratory effort, no increased work of breathing. GI: Abdomen is soft, nontender, nondistended, no abdominal masses GU: No CVA tenderness Lymph: No cervical or inguinal lymphadenopathy. Skin: No rashes, bruises or suspicious lesions. Neurologic: Grossly intact, no focal deficits, moving all 4 extremities. Psychiatric: Normal mood and affect.  Laboratory Data: Lab Results  Component Value Date   WBC 5.4 04/20/2018   HGB 12.5 04/20/2018   HCT 37.0 04/20/2018   MCV 88.1 04/20/2018   PLT 254 04/20/2018    Lab Results  Component Value Date   CREATININE 0.70 11/27/2018     Lab Results  Component Value Date   HGBA1C 5.6 10/01/2015    Urinalysis UA reviewed today, unremarkable, see epic   Assessment & Plan:    1. OAB (overactive bladder) Your refractory OAB managed with Botox now transferring care  We discussed options  including continued Botox, versus PTNS versus InterStim  She is interested in resuming Botox after discussing risk and benefits.  She like this to be done in the office.  She understands that I do not prescribe narcotics for this but would be willing to given Ativan but she will need a driver.  We will go ahead and send off her urine today for culture and if the procedure is within 1 month, this should be sufficient.  Periprocedural antibiotics will be given.  Risk and benefits were discussed again in detail.  All questions answered.  We will plan to give her 100 units as this has been her standard dose for quite some time.  She is been able to empty her bladder with PVR is 100 cc or less with this dose based on chart review. - CULTURE, URINE COMPREHENSIVE  2. Urge incontinence As above  3. Chronic cystitis Documented history of chronic cystitis/recurrent urinary  tract infections although not recently  Prefer to avoid chronic suppressive antibiotics.  Send urine culture and treat as needed when symptomatic. - Urinalysis, Complete  4. History of vesicoureteral reflux Status post ureteral reimplant   Return for MD follow up botox in office.  Hollice Espy, MD  [facility] [address] [place], [place] [address] [phone]   Extensive review of records today.I spent 45 min with this patient of which greater than 50% was spent in counseling and coordination of care with the patient.

## 2019-10-10 LAB — URINALYSIS, COMPLETE
Bilirubin, UA: NEGATIVE
Glucose, UA: NEGATIVE
Ketones, UA: NEGATIVE
Leukocytes,UA: NEGATIVE
Nitrite, UA: NEGATIVE
Protein,UA: NEGATIVE
RBC, UA: NEGATIVE
Specific Gravity, UA: 1.025 (ref 1.005–1.030)
Urobilinogen, Ur: 0.2 mg/dL (ref 0.2–1.0)
pH, UA: 5.5 (ref 5.0–7.5)

## 2019-10-10 LAB — MICROSCOPIC EXAMINATION: RBC, Urine: NONE SEEN /hpf (ref 0–2)

## 2019-10-11 ENCOUNTER — Other Ambulatory Visit: Payer: Self-pay | Admitting: Internal Medicine

## 2019-10-11 ENCOUNTER — Other Ambulatory Visit: Payer: Self-pay

## 2019-10-11 ENCOUNTER — Ambulatory Visit
Admission: RE | Admit: 2019-10-11 | Discharge: 2019-10-11 | Disposition: A | Discharge status: Home / Self Care | Payer: BC Managed Care – PPO | Source: Ambulatory Visit | Attending: Internal Medicine | Admitting: Internal Medicine

## 2019-10-11 DIAGNOSIS — R1310 Dysphagia, unspecified: Secondary | ICD-10-CM | POA: Diagnosis not present

## 2019-10-11 DIAGNOSIS — R109 Unspecified abdominal pain: Secondary | ICD-10-CM | POA: Diagnosis not present

## 2019-10-11 DIAGNOSIS — N281 Cyst of kidney, acquired: Secondary | ICD-10-CM | POA: Diagnosis not present

## 2019-10-12 LAB — CULTURE, URINE COMPREHENSIVE

## 2019-10-15 ENCOUNTER — Ambulatory Visit
Admission: RE | Admit: 2019-10-15 | Discharge: 2019-10-15 | Disposition: A | Discharge status: Home / Self Care | Payer: BC Managed Care – PPO | Source: Ambulatory Visit | Attending: Internal Medicine | Admitting: Internal Medicine

## 2019-10-15 DIAGNOSIS — Z1231 Encounter for screening mammogram for malignant neoplasm of breast: Secondary | ICD-10-CM | POA: Diagnosis not present

## 2019-10-16 ENCOUNTER — Telehealth: Payer: Self-pay | Admitting: Urology

## 2019-10-16 ENCOUNTER — Other Ambulatory Visit: Payer: Self-pay | Admitting: Internal Medicine

## 2019-10-16 DIAGNOSIS — N6320 Unspecified lump in the left breast, unspecified quadrant: Secondary | ICD-10-CM

## 2019-10-16 DIAGNOSIS — R928 Other abnormal and inconclusive findings on diagnostic imaging of breast: Secondary | ICD-10-CM

## 2019-10-16 DIAGNOSIS — R921 Mammographic calcification found on diagnostic imaging of breast: Secondary | ICD-10-CM

## 2019-10-16 NOTE — Telephone Encounter (Signed)
Its been there for a while and does not have any worrisome features.  No need to worry about this.  Hollice Espy, MD

## 2019-10-16 NOTE — Telephone Encounter (Signed)
Pt wanted to make [place] aware that she has a cyst on her kidney.  They tested her gall bladder and saw a cyst on her kidney.  She wanted to make [place] aware.

## 2019-10-17 NOTE — Telephone Encounter (Signed)
Left patient message with details-asked to return call with any questions

## 2019-10-25 ENCOUNTER — Ambulatory Visit
Admission: RE | Admit: 2019-10-25 | Discharge: 2019-10-25 | Disposition: A | Discharge status: Home / Self Care | Payer: BC Managed Care – PPO | Source: Ambulatory Visit | Attending: Internal Medicine | Admitting: Internal Medicine

## 2019-10-25 DIAGNOSIS — N6323 Unspecified lump in the left breast, lower outer quadrant: Secondary | ICD-10-CM | POA: Diagnosis not present

## 2019-10-25 DIAGNOSIS — R928 Other abnormal and inconclusive findings on diagnostic imaging of breast: Secondary | ICD-10-CM | POA: Insufficient documentation

## 2019-10-25 DIAGNOSIS — R921 Mammographic calcification found on diagnostic imaging of breast: Secondary | ICD-10-CM | POA: Insufficient documentation

## 2019-10-25 DIAGNOSIS — N6320 Unspecified lump in the left breast, unspecified quadrant: Secondary | ICD-10-CM | POA: Diagnosis not present

## 2019-10-28 ENCOUNTER — Other Ambulatory Visit: Payer: Self-pay | Admitting: Internal Medicine

## 2019-10-28 DIAGNOSIS — R928 Other abnormal and inconclusive findings on diagnostic imaging of breast: Secondary | ICD-10-CM

## 2019-10-28 NOTE — Progress Notes (Signed)
Order placed for referral to Dr Byrnett.   

## 2019-11-01 ENCOUNTER — Other Ambulatory Visit: Payer: Self-pay | Admitting: Family Medicine

## 2019-11-01 DIAGNOSIS — N3281 Overactive bladder: Secondary | ICD-10-CM

## 2019-11-04 ENCOUNTER — Other Ambulatory Visit: Payer: BC Managed Care – PPO

## 2019-11-04 ENCOUNTER — Other Ambulatory Visit: Payer: Self-pay

## 2019-11-04 DIAGNOSIS — N3281 Overactive bladder: Secondary | ICD-10-CM

## 2019-11-04 LAB — MICROSCOPIC EXAMINATION
Bacteria, UA: NONE SEEN
RBC, Urine: NONE SEEN /hpf (ref 0–2)

## 2019-11-04 LAB — URINALYSIS, COMPLETE
Bilirubin, UA: NEGATIVE
Glucose, UA: NEGATIVE
Ketones, UA: NEGATIVE
Leukocytes,UA: NEGATIVE
Nitrite, UA: NEGATIVE
Protein,UA: NEGATIVE
Specific Gravity, UA: >1.03 — ABNORMAL HIGH (ref 1.005–1.030)
Urobilinogen, Ur: 0.2 mg/dL (ref 0.2–1.0)
pH, UA: 6 (ref 5.0–7.5)

## 2019-11-12 ENCOUNTER — Ambulatory Visit: Payer: BC Managed Care – PPO | Admitting: Urology

## 2019-11-13 ENCOUNTER — Encounter: Payer: Self-pay | Admitting: Urology

## 2019-11-13 ENCOUNTER — Ambulatory Visit (INDEPENDENT_AMBULATORY_CARE_PROVIDER_SITE_OTHER): Payer: BC Managed Care – PPO | Admitting: Urology

## 2019-11-13 ENCOUNTER — Other Ambulatory Visit: Payer: Self-pay

## 2019-11-13 VITALS — BP 102/64 | HR 71 | Ht 66.0 in | Wt 130.0 lb

## 2019-11-13 DIAGNOSIS — N3281 Overactive bladder: Secondary | ICD-10-CM

## 2019-11-13 MED ORDER — ONABOTULINUMTOXINA 100 UNITS IJ SOLR
100.0000 [IU] | Freq: Once | INTRAMUSCULAR | Status: AC
  Administered 2019-11-13: 100 [IU] via INTRAMUSCULAR

## 2019-11-13 MED ORDER — LIDOCAINE HCL 2 % IJ SOLN
60.0000 mL | Freq: Once | INTRAMUSCULAR | Status: AC
  Administered 2019-11-13: 1200 mg

## 2019-11-13 MED ORDER — CIPROFLOXACIN HCL 500 MG PO TABS
500.0000 mg | ORAL_TABLET | Freq: Once | ORAL | Status: AC
  Administered 2019-11-13: 500 mg via ORAL

## 2019-11-13 NOTE — Progress Notes (Signed)
Bladder Instillation  Due to oab patient is present today for a Bladder Instillation of Lidocaine 2 %. Patient was cleaned and prepped in a sterile fashion with betadine and lidocaine 2% jelly was instilled into the urethra.  A 14FR catheter was inserted, urine return was noted 63ml. 60 ml was instilled into the bladder. The catheter was then removed. Patient tolerated well, no complications were noted Patient held in bladder for 30 minutes prior to procedure starting.   Preformed by: Gaspar Cola CMA

## 2019-11-13 NOTE — Progress Notes (Signed)
   11/13/19  CC:  Chief Complaint  Patient presents with  . Follow-up    HPI: 53 year old female with a personal history of severe refractory OAB managed with Botox who presents today for Botox injection.  Please see previous notes for details.  Preprocedural urine culture was negative.  She did receive p.o. antibiotics today for the procedure.  Lidocaine jelly was instilled into the bladder prior to the procedure allowed to dwell for 30 minutes.  Please see CMA note for details.  Blood pressure 102/64, pulse 71, height 5\' 6"  (1.676 m), weight 130 lb (59 kg), last menstrual period 02/07/1994. NED. A&Ox3.   No respiratory distress   Abd soft, NT, ND Normal external genitalia with patent urethral meatus  Cystoscopy Procedure Note  Patient identification was confirmed, informed consent was obtained, and patient was prepped using Betadine solution.  Lidocaine jelly was administered per urethral meatus.    Procedure: - Flexible cystoscope introduced, without any difficulty.   - Thorough search of the bladder revealed:    normal urethral meatus    normal urothelium    no stones    no ulcers     no tumors    no urethral polyps    no trabeculation  - Ureteral orifices were normal in position and appearance.  At this point in time, flexible beveled injectable needle was used through the working port at which time 2 rows of 10 injections were given just superior to the trigone along the posterior bladder wall delivering a total of 100 units of Botox.    Post-Procedure: - Patient tolerated the procedure well  Assessment/ Plan:  1. OAB (overactive bladder) Status post well-tolerated in office Botox injection to the bladder, 100 units  Post procedure warning symptoms reviewed  Given that she is tolerated this medication well in the past, will just have her follow-up in 6 months.  She will return back sooner as needed. - lidocaine (XYLOCAINE) 2 % (with pres) injection 1,200  mg - botulinum toxin Type A (BOTOX) injection 100 Units - ciprofloxacin (CIPRO) tablet 500 mg     Return in about 6 months (around 05/13/2020) for UA/ UCx, rechecheck urine symptoms and schedule next botox.  Hollice Espy, MD

## 2019-11-21 ENCOUNTER — Ambulatory Visit (INDEPENDENT_AMBULATORY_CARE_PROVIDER_SITE_OTHER): Payer: BC Managed Care – PPO | Admitting: Internal Medicine

## 2019-11-21 ENCOUNTER — Encounter: Payer: Self-pay | Admitting: Internal Medicine

## 2019-11-21 ENCOUNTER — Other Ambulatory Visit: Payer: Self-pay

## 2019-11-21 DIAGNOSIS — R109 Unspecified abdominal pain: Secondary | ICD-10-CM

## 2019-11-21 DIAGNOSIS — N3020 Other chronic cystitis without hematuria: Secondary | ICD-10-CM

## 2019-11-21 DIAGNOSIS — J3489 Other specified disorders of nose and nasal sinuses: Secondary | ICD-10-CM

## 2019-11-21 DIAGNOSIS — K219 Gastro-esophageal reflux disease without esophagitis: Secondary | ICD-10-CM

## 2019-11-21 DIAGNOSIS — R079 Chest pain, unspecified: Secondary | ICD-10-CM | POA: Diagnosis not present

## 2019-11-21 DIAGNOSIS — D649 Anemia, unspecified: Secondary | ICD-10-CM

## 2019-11-21 DIAGNOSIS — M79671 Pain in right foot: Secondary | ICD-10-CM

## 2019-11-21 DIAGNOSIS — R928 Other abnormal and inconclusive findings on diagnostic imaging of breast: Secondary | ICD-10-CM

## 2019-11-21 DIAGNOSIS — F439 Reaction to severe stress, unspecified: Secondary | ICD-10-CM

## 2019-11-21 MED ORDER — MUPIROCIN 2 % EX OINT: 1.0000 "application " | TOPICAL_OINTMENT | Freq: Two times a day (BID) | CUTANEOUS | 0 refills | Status: DC

## 2019-11-21 NOTE — Progress Notes (Signed)
Patient ID: [place], female   DOB: Feb 18, 1966, 53 y.o.   MRN: [id number]   Virtual Visit via video Note  This visit type was conducted due to national recommendations for restrictions regarding the COVID-19 pandemic (e.g. social distancing).  This format is felt to be most appropriate for this patient at this time.  All issues noted in this document were discussed and addressed.  No physical exam was performed (except for noted visual exam findings with Video Visits).   I connected with [place] by a video enabled telemedicine application and verified that I am speaking with the correct person using two identifiers. Location patient: home Location provider: work Persons participating in the virtual visit: patient, provider  I discussed the limitations, risks, security and privacy concerns of performing an evaluation and management service by video and the availability of in person appointments. The patient expressed understanding and agreed to proceed.   Reason for visit: scheduled follow up.   HPI: She reports she is doing relatively well.  Stays active.  Working.  Seeing urology for f/u chronic cystitis.  Was seen in ER 09/09/19 - for chest pain.  W/up unrevealing.  States she has not experienced any pain like that since.  Was experiencing abdominal pain.  Had recent abdominal ultrasound.  Renal cyst, but otherwise unrevealing.  Taking nexium.  Still some burning in her chest with drinking (or eating at times).  Only certain things she eats aggravates.  Some constipation.  Breathing stable.  No increased cough or congestion.  No fever. Does report nasal lesion.  Discussed bactroban. Due f/u with Dr Bary Castilla - end of month - f/u breast.  Also persistent foot pain.  Seeing Dr Milinda Pointer.  Has f/u in 2 weeks.  Worse in am.     ROS: See pertinent positives and negatives per HPI.  Past Medical History:  Diagnosis Date  . Chronic cystitis   . Frequent UTI   . Incomplete bladder emptying     . 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 ABDOMINAL HYSTERECTOMY  1995   2/2 delivery complications with 3rd child. Hysterectomy and bladder repair with subsequent appendectomy. L Ovary remain in place   . APPENDECTOMY     2001 due to scar tissue  . BLADDER BOTOX  2014, 2015, 2016  . BREAST BIOPSY Bilateral 1991  . BREAST SURGERY Bilateral 2002   benign mass removed  . COLONOSCOPY  2014  . FOOT SURGERY Bilateral   . LAPAROSCOPIC HYSTERECTOMY  1995  . reconstruction of bladder   1995    2/2 delivery complications with 3rd child     Family History  Problem Relation Age of Onset  . Colon cancer Father        With ostomy in place   . Breast cancer Paternal Aunt 81    SOCIAL HX: reviewed.    Current Outpatient Medications:  .  estradiol (ESTRACE) 2 MG tablet, Take by mouth., Disp: , Rfl:  .  LORazepam (ATIVAN) 1 MG tablet, Take 1 tablet (1 mg total) by mouth every 8 (eight) hours., Disp: 1 tablet, Rfl: 0 .  mupirocin ointment (BACTROBAN) 2 %, Place 1 application into the nose 2 (two) times daily., Disp: 22 g, Rfl: 0  EXAM:  GENERAL: alert, oriented, appears well and in no acute distress  HEENT: atraumatic, conjunttiva clear, no obvious abnormalities on inspection of external nose and ears  NECK: normal movements of the head  and neck  LUNGS: on inspection no signs of respiratory distress, breathing rate appears normal, no obvious gross SOB, gasping or wheezing  CV: no obvious cyanosis  PSYCH/NEURO: pleasant and cooperative, no obvious depression or anxiety, speech and thought processing grossly intact  ASSESSMENT AND PLAN:  Discussed the following assessment and plan:  Abdominal pain Previously evaluated for chest/abdominal pain as outlined.  Abdominal ultrasound - renal cyst - unrevealing otherwise.  On nexium.  Some burning with certain foods and drinking.  Feels better on nexium.  Discussed further  evaluation - GI evaluation.  She declines.  Wants to monitor.    Anemia Follow cbc.   Chest pain Recently evaluated in ER.  Denies any recurring chest pain.  Follow.    Chronic cystitis Followed by urology.   Stress Increased stress.  Overall she feels she is handlign things relatively well.  Follow.    Internal nasal lesion Bactroban as directed.  Notify me if persistent.    Abnormal mammogram Recent mammogram - Birads IV.  Seeing surgery.  Has f/u planned 12/10/19.    Foot pain, right Has f/u planned with podiatry in 2 weeks.    GERD (gastroesophageal reflux disease) Burning as outlined.  On nexium.  Has helped.  Still with some persistent symptoms - with certain foods.  Continue nexium. Recent barium swallow with UGI - normal.  Discussed further GI w/up given persistent symptoms.  She declines.  Wants to monitor.      I discussed the assessment and treatment plan with the patient. The patient was provided an opportunity to ask questions and all were answered. The patient agreed with the plan and demonstrated an understanding of the instructions.   The patient was advised to call back or seek an in-person evaluation if the symptoms worsen or if the condition fails to improve as anticipated.   Einar Pheasant, MD

## 2019-11-24 ENCOUNTER — Telehealth: Payer: Self-pay | Admitting: Internal Medicine

## 2019-11-24 ENCOUNTER — Encounter: Payer: Self-pay | Admitting: Internal Medicine

## 2019-11-24 DIAGNOSIS — M79671 Pain in right foot: Secondary | ICD-10-CM | POA: Insufficient documentation

## 2019-11-24 DIAGNOSIS — K219 Gastro-esophageal reflux disease without esophagitis: Secondary | ICD-10-CM | POA: Insufficient documentation

## 2019-11-24 DIAGNOSIS — J3489 Other specified disorders of nose and nasal sinuses: Secondary | ICD-10-CM | POA: Insufficient documentation

## 2019-11-24 NOTE — Assessment & Plan Note (Signed)
Recent mammogram - Birads IV.  Seeing surgery.  Has f/u planned 12/10/19.

## 2019-11-24 NOTE — Assessment & Plan Note (Signed)
Previously evaluated for chest/abdominal pain as outlined.  Abdominal ultrasound - renal cyst - unrevealing otherwise.  On nexium.  Some burning with certain foods and drinking.  Feels better on nexium.  Discussed further evaluation - GI evaluation.  She declines.  Wants to monitor.

## 2019-11-24 NOTE — Assessment & Plan Note (Signed)
Followed by urology.   

## 2019-11-24 NOTE — Assessment & Plan Note (Signed)
Burning as outlined.  On nexium.  Has helped.  Still with some persistent symptoms - with certain foods.  Continue nexium. Recent barium swallow with UGI - normal.  Discussed further GI w/up given persistent symptoms.  She declines.  Wants to monitor.

## 2019-11-24 NOTE — Assessment & Plan Note (Signed)
Increased stress.  Overall she feels she is handlign things relatively well.  Follow.

## 2019-11-24 NOTE — Telephone Encounter (Signed)
-----   Message from Hollice Espy, MD sent at 11/24/2019  1:01 PM EST ----- Regarding: RE: question - f/u No need to follow simple cyst like this, only ones with some complexity.  Hollice Espy ----- Message ----- From: Einar Pheasant, MD Sent: 11/24/2019  11:38 AM EST To: Hollice Espy, MD Subject: question - f/u                                 Ms [place] is a patient that you follow for chronic cystitis and OAB.  On recent abdominal ultrasound, radiology commented on left renal cyst (3cm).  They commented "stable".  Do you recommend any follow up ultrasound - to follow up on the renal cyst.  Just let me know if I need to do anything more.  Thank you for your help with this patient.    Thank you again.  Einar Pheasant

## 2019-11-24 NOTE — Assessment & Plan Note (Signed)
Recently evaluated in ER.  Denies any recurring chest pain.  Follow.

## 2019-11-24 NOTE — Assessment & Plan Note (Signed)
Bactroban as directed.  Notify me if persistent.

## 2019-11-24 NOTE — Assessment & Plan Note (Signed)
Follow cbc.  

## 2019-11-24 NOTE — Assessment & Plan Note (Signed)
Has f/u planned with podiatry in 2 weeks.

## 2019-12-04 ENCOUNTER — Encounter: Payer: Self-pay | Admitting: Podiatry

## 2019-12-04 ENCOUNTER — Other Ambulatory Visit: Payer: Self-pay

## 2019-12-04 ENCOUNTER — Ambulatory Visit (INDEPENDENT_AMBULATORY_CARE_PROVIDER_SITE_OTHER): Payer: BC Managed Care – PPO | Admitting: Podiatry

## 2019-12-04 ENCOUNTER — Ambulatory Visit (INDEPENDENT_AMBULATORY_CARE_PROVIDER_SITE_OTHER): Payer: BC Managed Care – PPO

## 2019-12-04 DIAGNOSIS — M7751 Other enthesopathy of right foot: Secondary | ICD-10-CM

## 2019-12-04 DIAGNOSIS — M778 Other enthesopathies, not elsewhere classified: Secondary | ICD-10-CM | POA: Diagnosis not present

## 2019-12-04 DIAGNOSIS — M7752 Other enthesopathy of left foot: Secondary | ICD-10-CM

## 2019-12-04 DIAGNOSIS — M2580 Other specified joint disorders, unspecified joint: Secondary | ICD-10-CM

## 2019-12-04 NOTE — Progress Notes (Signed)
Subjective:  Patient ID: [place], female    DOB: 29-Apr-1966,  MRN: [id number] HPI Chief Complaint  Patient presents with  . Foot Pain    Patient presenst today for continued right foot pain and right hallux pain.  She says it never got better and is a constant ache, but has gotten worse over the past couple of months.  She also c/o left 5th met pain, possible Taylors bunion.  She states it started about 1 week ago and she has sharp pains that come and go.  She has been soaking in Epson salt and taking Tylenol for relief    53 y.o. female presents with the above complaint.   ROS: Denies fever chills nausea vomiting muscle aches pains calf pain back pain chest pain shortness of breath.  Past Medical History:  Diagnosis Date  . Chronic cystitis   . Frequent UTI   . Incomplete bladder emptying   . 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 ABDOMINAL HYSTERECTOMY  1995   2/2 delivery complications with 3rd child. Hysterectomy and bladder repair with subsequent appendectomy. L Ovary remain in place   . APPENDECTOMY     2001 due to scar tissue  . BLADDER BOTOX  2014, 2015, 2016  . BREAST BIOPSY Bilateral 1991  . BREAST SURGERY Bilateral 2002   benign mass removed  . COLONOSCOPY  2014  . FOOT SURGERY Bilateral   . LAPAROSCOPIC HYSTERECTOMY  1995  . reconstruction of bladder   1995    2/2 delivery complications with 3rd child     Current Outpatient Medications:  .  estradiol (ESTRACE) 2 MG tablet, Take by mouth., Disp: , Rfl:  .  LORazepam (ATIVAN) 1 MG tablet, Take 1 tablet (1 mg total) by mouth every 8 (eight) hours., Disp: 1 tablet, Rfl: 0  Allergies  Allergen Reactions  . Penicillin V Potassium    Review of Systems Objective:  There were no vitals filed for this visit.  General: Well developed, nourished, in no acute distress, alert and oriented x3   Dermatological: Skin is warm, dry and supple  bilateral. Nails x 10 are well maintained; remaining integument appears unremarkable at this time. There are no open sores, no preulcerative lesions, no rash or signs of infection present.  Vascular: Dorsalis Pedis artery and Posterior Tibial artery pedal pulses are 2/4 bilateral with immedate capillary fill time. Pedal hair growth present. No varicosities and no lower extremity edema present bilateral.   Neruologic: Grossly intact via light touch bilateral. Vibratory intact via tuning fork bilateral. Protective threshold with Semmes Wienstein monofilament intact to all pedal sites bilateral. Patellar and Achilles deep tendon reflexes 2+ bilateral. No Babinski or clonus noted bilateral.   Musculoskeletal: No gross boney pedal deformities bilateral. No pain, crepitus, or limitation noted with foot and ankle range of motion bilateral. Muscular strength 5/5 in all groups tested bilateral.  She has pain on palpation of the fibular sesamoid and of the K wires the dorsal aspect of the first metatarsal.  She also has pain to palpation fifth metatarsal left.  There is an overlying bursitis subfifth metatarsal left.  Gait: Unassisted, Nonantalgic.    Radiographs:  Radiographs demonstrate osseously mature feet bilaterally.  Mild tailor's bunion deformity fifth left.  She also has retained 2 K wires to the first metatarsal with some joint space narrowing consistent with early osteoarthritic changes and a repair of a bunion.  Assessment & Plan:  Assessment: Bursitis fifth left.  Painful internal fixation first metatarsal right foot.  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  Plan: Discussed etiology pathology conservative versus surgical therapies at this point I injected the fibular sesamoid area dorsal to plantar in the first intermetatarsal space with 20 mg Kenalog 5 mg Marcaine.  Tolerated procedure well though was mildly tender.  She also was injected subfifth  met head left with the bursitis 2 mg of dexamethasone and local anesthetic.  We also consented her for removal of K wires of the first metatarsal of the right foot.  Discussed this in depth today we did discuss the possible complications associated with it.  I will follow-up with her in the future for this.  Also I want her to follow-up with Liliane Channel to reevaluate her orthotics.  I feel that the first metatarsophalangeal joint needs to be offloaded more either with a metatarsal pad or dancers pad to keep the fifth metatarsal floating to some degree.  She will follow-up with him soon as possible     Sherlyn [place]bert T. [place], [place]

## 2019-12-04 NOTE — Patient Instructions (Signed)
Pre-Operative Instructions  Congratulations, you have decided to take an important step towards improving your quality of life.  You can be assured that the doctors and staff at [facility] will be with you every step of the way.  Here are some important things you should know:  1. Plan to be at the surgery center/hospital at least 1 (one) hour prior to your scheduled time, unless otherwise directed by the surgical center/hospital staff.  You must have a responsible adult accompany you, remain during the surgery and drive you home.  Make sure you have directions to the surgical center/hospital to ensure you arrive on time. 2. If you are having surgery at [facility] or [facility] hospitals, you will need a copy of your medical history and physical form from your family physician within one month prior to the date of surgery. We will give you a form for your primary physician to complete.  3. We make every effort to accommodate the date you request for surgery.  However, there are times where surgery dates or times have to be moved.  We will contact you as soon as possible if a change in schedule is required.   4. No aspirin/ibuprofen for one week before surgery.  If you are on aspirin, any non-steroidal anti-inflammatory medications (Mobic, Aleve, Ibuprofen) should not be taken seven (7) days prior to your surgery.  You make take Tylenol for pain prior to surgery.  5. Medications - If you are taking daily heart and blood pressure medications, seizure, reflux, allergy, asthma, anxiety, pain or diabetes medications, make sure you notify the surgery center/hospital before the day of surgery so they can tell you which medications you should take or avoid the day of surgery. 6. No food or drink after midnight the night before surgery unless directed otherwise by surgical center/hospital staff. 7. No alcoholic beverages 24-hours prior to surgery.  No smoking 24-hours prior or 24-hours after  surgery. 8. Wear loose pants or shorts. They should be loose enough to fit over bandages, boots, and casts. 9. Don't wear slip-on shoes. Sneakers are preferred. 10. Bring your boot with you to the surgery center/hospital.  Also bring crutches or a walker if your physician has prescribed it for you.  If you do not have this equipment, it will be provided for you after surgery. 11. If you have not been contacted by the surgery center/hospital by the day before your surgery, call to confirm the date and time of your surgery. 12. Leave-time from work may vary depending on the type of surgery you have.  Appropriate arrangements should be made prior to surgery with your employer. 13. Prescriptions will be provided immediately following surgery by your doctor.  Fill these as soon as possible after surgery and take the medication as directed. Pain medications will not be refilled on weekends and must be approved by the doctor. 14. Remove nail polish on the operative foot and avoid getting pedicures prior to surgery. 15. Wash the night before surgery.  The night before surgery wash the foot and leg well with water and the antibacterial soap provided. Be sure to pay special attention to beneath the toenails and in between the toes.  Wash for at least three (3) minutes. Rinse thoroughly with water and dry well with a towel.  Perform this wash unless told not to do so by your physician.  Enclosed: 1 Ice pack (please put in freezer the night before surgery)   1 Hibiclens skin cleaner     Pre-op instructions  If you have any questions regarding the instructions, please do not hesitate to call our office.  [place]: [address], [place], [place] [address] -- [phone]  [place]: [address] [place], [place] [address] -- [phone]  [place]: [address], [place], [place] [address] -- [phone]   Website: [email or web address] 

## 2019-12-09 ENCOUNTER — Telehealth: Payer: Self-pay | Admitting: Internal Medicine

## 2019-12-09 ENCOUNTER — Other Ambulatory Visit: Payer: Self-pay | Admitting: Internal Medicine

## 2019-12-09 DIAGNOSIS — R928 Other abnormal and inconclusive findings on diagnostic imaging of breast: Secondary | ICD-10-CM

## 2019-12-09 NOTE — Telephone Encounter (Signed)
There were no pending orders.  I copied the previous orders and signed off on them.  Please let me know if these are not correct.  Also, let me know if I need to talk to [place].  Will you let Sharyn Lull know these have been signed and confirm correct orders.  Thanks.    Dr Nicki Reaper

## 2019-12-09 NOTE — Telephone Encounter (Signed)
Pt needs Dr Nicki Reaper to sign off on order for mammography. Pt is wanting to be scheduled this week since she is off all week. Please call Sharyn Lull at [place] @ [phone]

## 2019-12-09 NOTE — Progress Notes (Signed)
Order placed for ultrasound and mm clip - biopsy.

## 2019-12-10 DIAGNOSIS — N6324 Unspecified lump in the left breast, lower inner quadrant: Secondary | ICD-10-CM | POA: Diagnosis not present

## 2019-12-10 DIAGNOSIS — R928 Other abnormal and inconclusive findings on diagnostic imaging of breast: Secondary | ICD-10-CM | POA: Diagnosis not present

## 2019-12-10 NOTE — Telephone Encounter (Signed)
Office closed today will return call.

## 2019-12-12 ENCOUNTER — Ambulatory Visit
Admission: RE | Admit: 2019-12-12 | Discharge: 2019-12-12 | Disposition: A | Discharge status: Home / Self Care | Payer: BC Managed Care – PPO | Source: Ambulatory Visit | Attending: Internal Medicine | Admitting: Internal Medicine

## 2019-12-12 DIAGNOSIS — R928 Other abnormal and inconclusive findings on diagnostic imaging of breast: Secondary | ICD-10-CM | POA: Diagnosis not present

## 2019-12-12 DIAGNOSIS — N6324 Unspecified lump in the left breast, lower inner quadrant: Secondary | ICD-10-CM | POA: Diagnosis not present

## 2019-12-12 DIAGNOSIS — N6323 Unspecified lump in the left breast, lower outer quadrant: Secondary | ICD-10-CM | POA: Diagnosis not present

## 2019-12-12 HISTORY — PX: BREAST BIOPSY: SHX20

## 2019-12-13 DIAGNOSIS — C801 Malignant (primary) neoplasm, unspecified: Secondary | ICD-10-CM

## 2019-12-13 HISTORY — DX: Malignant (primary) neoplasm, unspecified: C80.1

## 2019-12-17 LAB — SURGICAL PATHOLOGY

## 2019-12-19 ENCOUNTER — Telehealth: Payer: Self-pay | Admitting: Internal Medicine

## 2019-12-19 NOTE — Telephone Encounter (Signed)
Pt has some questions regarding Biopsy results on breast. Please advise and Thank you!  Call pt @ [phone]

## 2019-12-20 ENCOUNTER — Telehealth: Payer: Self-pay | Admitting: Lab

## 2019-12-20 NOTE — Telephone Encounter (Signed)
Called Pt No answer left VM to all office back.

## 2019-12-20 NOTE — Telephone Encounter (Signed)
Pt returned [name]'s phone call, please call patient back.

## 2019-12-20 NOTE — Telephone Encounter (Signed)
LMTCB

## 2019-12-20 NOTE — Telephone Encounter (Signed)
Patient stated that she wanted had questions about biopsy. Stated if she would like to talk to Dr Nicki Reaper I could schedule her for a virtual visit. Patient stated that she would like to hold on scheduling appointment. She has appt with Dr Peyton Najjar

## 2019-12-24 ENCOUNTER — Ambulatory Visit: Payer: Self-pay | Admitting: General Surgery

## 2019-12-24 DIAGNOSIS — N6092 Unspecified benign mammary dysplasia of left breast: Secondary | ICD-10-CM | POA: Diagnosis not present

## 2019-12-24 NOTE — H&P (View-Only) (Signed)
PATIENT PROFILE: Rose Bernard is a 54 y.o. female who presents to the Clinic for consultation at the request of Dr. Nicki Reaper for evaluation of atypical ductal hyperplasia.  PCP:  Alisa Graff, MD  HISTORY OF PRESENT ILLNESS: Rose Bernard reports she had is a screening mammogram.  She denies any previous palpable breast mass, skin changes or nipple retraction.  She does report chronic soreness of bilateral breast.  There is no radiation.  There is no alleviating or aggravating factor.  Family history of breast cancer: Aunt with breast cancer Family history of other cancers: Father with colon cancer Menarche: 7 years old Menopause: Had hysterectomy at 54 years old Used OCP: Yes Used estrogen and progesterone therapy: Estradiol History of Radiation to the chest: None Number of pregnancies: 3 in total  PROBLEM LIST:         Problem List  Date Reviewed: 03/31/2018         Noted   Anemia 03/31/2018   Shingles 01/18/2018   Overview    Last Assessment & Plan:  Recent diagnosis.  Treated with valtrex previously.  Has gabapentin. Some residual burning.  Instructed to take gabapentin 100mg  q hs.  Follow.  Notify me if any change or worsening symptoms.      Family history of colon cancer 02/20/2013   Overview    Colonoscopy 02/13/13 - internal hemorrhoids otherwise normal.  Repeat five years  Last Assessment & Plan:  Colonoscopy 02/2013.  Recommended f/u in 5 years.  Due.  Refer to GI.         GENERAL REVIEW OF SYSTEMS:   General ROS: negative for - chills, fatigue, fever, weight gain or weight loss Allergy and Immunology ROS: negative for - hives  Hematological and Lymphatic ROS: negative for - bleeding problems or bruising, negative for palpable nodes Endocrine ROS: negative for - heat or cold intolerance, hair changes Respiratory ROS: negative for - cough, shortness of breath or wheezing Cardiovascular ROS: no chest pain or palpitations GI ROS: negative for  nausea, vomiting, abdominal pain, diarrhea, constipation Musculoskeletal ROS: negative for - joint swelling or muscle pain Neurological ROS: negative for - confusion, syncope Dermatological ROS: negative for pruritus and rash Psychiatric: negative for anxiety, depression, difficulty sleeping and memory loss  MEDICATIONS: Current Medications        Current Outpatient Medications  Medication Sig Dispense Refill  . estradioL (ESTRACE) 2 MG tablet TAKE 1 TABLET DAILY 90 tablet 0   No current facility-administered medications for this visit.       ALLERGIES: Penicillins  PAST MEDICAL HISTORY:     Past Medical History:  Diagnosis Date  . Anemia 03/31/2018  . Overactive bladder     PAST SURGICAL HISTORY:      Past Surgical History:  Procedure Laterality Date  . APPENDECTOMY    . BLADDER SURGERY    . COLONOSCOPY  02/13/2013, 10/01/2007, 02/03/2003   [place] (Father): CBF 02/2018; Recall Ltr mailed 01/16/2018 (dh)  . COLONOSCOPY  08/02/2018   [place] (Father) CBF 07/2023  . EGD  10/01/2007   No repeat per RTE  . HYSTERECTOMY       FAMILY HISTORY:      Family History  Problem Relation Age of Onset  . Colon cancer Father   . Breast cancer Paternal Aunt      SOCIAL HISTORY: Social History          Socioeconomic History  . Marital status: Married    Spouse name: Not on file  .  Number of children: Not on file  . Years of education: Not on file  . Highest education level: Not on file  Occupational History  . Not on file  Social Needs  . Financial resource strain: Not on file  . Food insecurity    Worry: Not on file    Inability: Not on file  . Transportation needs    Medical: Not on file    Non-medical: Not on file  Tobacco Use  . Smoking status: Never Smoker  . Smokeless tobacco: Never Used  Substance and Sexual Activity  . Alcohol use: No    Alcohol/week: 0.0 standard drinks  . Drug use: No  . Sexual activity: Yes    Birth  control/protection: Post-menopausal, Surgical    Comment: hysterectomy  Other Topics Concern  . Not on file  Social History Narrative  . Not on file      PHYSICAL EXAM:    Vitals:   12/24/19 1527  BP: 124/71  Pulse: 74   Body mass index is 21.63 kg/m. Weight: 60.8 kg (134 lb)   GENERAL: Alert, active, oriented x3  HEENT: Pupils equal reactive to light. Extraocular movements are intact. Sclera clear. Palpebral conjunctiva normal red color.Pharynx clear.  NECK: Supple with no palpable mass and no adenopathy.  LUNGS: Sound clear with no rales rhonchi or wheezes.  HEART: Regular rhythm S1 and S2 without murmur.  BREAST: Both were examined in the supine and sitting position.  There is no palpable mass, no skin changes, no nipple retraction.  There is no axillary adenopathy..  ABDOMEN: Soft and depressible, nontender with no palpable mass, no hepatomegaly.  EXTREMITIES: Well-developed well-nourished symmetrical with no dependent edema.  NEUROLOGICAL: Awake alert oriented, facial expression symmetrical, moving all extremities.  REVIEW OF DATA: I have reviewed the following data today:      Initial consult on 12/24/2019  Component Date Value  . WBC (White Blood Cell Co* 12/24/2019 5.2   . RBC (Red Blood Cell Coun* 12/24/2019 4.43   . Hemoglobin 12/24/2019 13.5   . Hematocrit 12/24/2019 41.6   . MCV (Mean Corpuscular Vo* 12/24/2019 93.9   . MCH (Mean Corpuscular He* 12/24/2019 30.5   . MCHC (Mean Corpuscular H* 12/24/2019 32.5   . Platelet Count 12/24/2019 262   . RDW-CV (Red Cell Distrib* 12/24/2019 12.5   . MPV (Mean Platelet Volum* 12/24/2019 9.2*  . Neutrophils 12/24/2019 2.66   . Lymphocytes 12/24/2019 1.98   . Monocytes 12/24/2019 0.46   . Eosinophils 12/24/2019 0.08   . Basophils 12/24/2019 0.05   . Neutrophil % 12/24/2019 50.8   . Lymphocyte % 12/24/2019 37.9   . Monocyte % 12/24/2019 8.8   . Eosinophil % 12/24/2019 1.5   . Basophil%  12/24/2019 1.0   . Immature Granulocyte % 12/24/2019 0.0   . Immature Granulocyte Cou* 12/24/2019 0.00      ASSESSMENT: Rose Bernard is a 54 y.o. female presenting for consultation for left breast atypical ductal hyperplasia.    Patient was oriented again about the pathology results. Surgical alternatives were discussed with patient including partial mastectomy. Surgical technique and post operative care was discussed with patient. Risk of surgery was discussed with patient including but not limited to: wound infection, seroma, hematoma, brachial plexopathy, mondor's disease (thrombosis of small veins of breast), chronic wound pain, breast lymphedema, altered sensation to the nipple and cosmesis among others.   Patient oriented about the implication having atypical ductal hyperplasia.  She was oriented that this is not malignancy, this  is a benign lesion but excision is recommended to rule out any upgrade to malignancy.  Also oriented about the possibility of referring her to the cancer center for breast cancer risk reduction alternatives.  Was oriented that she is at increased risk of having breast cancer in the future in either breast due to the history of having atypical hyperplasia.  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 Needle guided excisional biopsy of the left breat (19125) 2. CBC, CMP 3. Avoid aspirin 5 days before surgery 4. Contact us if has any question or concern.  Patient verbalized understanding, all questions were answered, and were agreeable with the plan outlined above.     Herbert Pun, MD  Electronically signed by Herbert Pun, MD

## 2019-12-24 NOTE — H&P (Signed)
PATIENT PROFILE: Rose Bernard is a 54 y.o. female who presents to the Clinic for consultation at the request of Dr. Nicki Reaper for evaluation of atypical ductal hyperplasia.  PCP:  Alisa Graff, MD  HISTORY OF PRESENT ILLNESS: Rose Bernard reports she had is a screening mammogram.  She denies any previous palpable breast mass, skin changes or nipple retraction.  She does report chronic soreness of bilateral breast.  There is no radiation.  There is no alleviating or aggravating factor.  Family history of breast cancer: Aunt with breast cancer Family history of other cancers: Father with colon cancer Menarche: 71 years old Menopause: Had hysterectomy at 54 years old Used OCP: Yes Used estrogen and progesterone therapy: Estradiol History of Radiation to the chest: None Number of pregnancies: 3 in total  PROBLEM LIST:         Problem List  Date Reviewed: 03/31/2018         Noted   Anemia 03/31/2018   Shingles 01/18/2018   Overview    Last Assessment & Plan:  Recent diagnosis.  Treated with valtrex previously.  Has gabapentin. Some residual burning.  Instructed to take gabapentin 100mg  q hs.  Follow.  Notify me if any change or worsening symptoms.      Family history of colon cancer 02/20/2013   Overview    Colonoscopy 02/13/13 - internal hemorrhoids otherwise normal.  Repeat five years  Last Assessment & Plan:  Colonoscopy 02/2013.  Recommended f/u in 5 years.  Due.  Refer to GI.         GENERAL REVIEW OF SYSTEMS:   General ROS: negative for - chills, fatigue, fever, weight gain or weight loss Allergy and Immunology ROS: negative for - hives  Hematological and Lymphatic ROS: negative for - bleeding problems or bruising, negative for palpable nodes Endocrine ROS: negative for - heat or cold intolerance, hair changes Respiratory ROS: negative for - cough, shortness of breath or wheezing Cardiovascular ROS: no chest pain or palpitations GI ROS: negative for  nausea, vomiting, abdominal pain, diarrhea, constipation Musculoskeletal ROS: negative for - joint swelling or muscle pain Neurological ROS: negative for - confusion, syncope Dermatological ROS: negative for pruritus and rash Psychiatric: negative for anxiety, depression, difficulty sleeping and memory loss  MEDICATIONS: Current Medications        Current Outpatient Medications  Medication Sig Dispense Refill  . estradioL (ESTRACE) 2 MG tablet TAKE 1 TABLET DAILY 90 tablet 0   No current facility-administered medications for this visit.       ALLERGIES: Penicillins  PAST MEDICAL HISTORY:     Past Medical History:  Diagnosis Date  . Anemia 03/31/2018  . Overactive bladder     PAST SURGICAL HISTORY:      Past Surgical History:  Procedure Laterality Date  . APPENDECTOMY    . BLADDER SURGERY    . COLONOSCOPY  02/13/2013, 10/01/2007, 02/03/2003   [place] (Father): CBF 02/2018; Recall Ltr mailed 01/16/2018 (dh)  . COLONOSCOPY  08/02/2018   [place] (Father) CBF 07/2023  . EGD  10/01/2007   No repeat per RTE  . HYSTERECTOMY       FAMILY HISTORY:      Family History  Problem Relation Age of Onset  . Colon cancer Father   . Breast cancer Paternal Aunt      SOCIAL HISTORY: Social History          Socioeconomic History  . Marital status: Married    Spouse name: Not on file  .  Number of children: Not on file  . Years of education: Not on file  . Highest education level: Not on file  Occupational History  . Not on file  Social Needs  . Financial resource strain: Not on file  . Food insecurity    Worry: Not on file    Inability: Not on file  . Transportation needs    Medical: Not on file    Non-medical: Not on file  Tobacco Use  . Smoking status: Never Smoker  . Smokeless tobacco: Never Used  Substance and Sexual Activity  . Alcohol use: No    Alcohol/week: 0.0 standard drinks  . Drug use: No  . Sexual activity: Yes    Birth  control/protection: Post-menopausal, Surgical    Comment: hysterectomy  Other Topics Concern  . Not on file  Social History Narrative  . Not on file      PHYSICAL EXAM:    Vitals:   12/24/19 1527  BP: 124/71  Pulse: 74   Body mass index is 21.63 kg/m. Weight: 60.8 kg (134 lb)   GENERAL: Alert, active, oriented x3  HEENT: Pupils equal reactive to light. Extraocular movements are intact. Sclera clear. Palpebral conjunctiva normal red color.Pharynx clear.  NECK: Supple with no palpable mass and no adenopathy.  LUNGS: Sound clear with no rales rhonchi or wheezes.  HEART: Regular rhythm S1 and S2 without murmur.  BREAST: Both were examined in the supine and sitting position.  There is no palpable mass, no skin changes, no nipple retraction.  There is no axillary adenopathy..  ABDOMEN: Soft and depressible, nontender with no palpable mass, no hepatomegaly.  EXTREMITIES: Well-developed well-nourished symmetrical with no dependent edema.  NEUROLOGICAL: Awake alert oriented, facial expression symmetrical, moving all extremities.  REVIEW OF DATA: I have reviewed the following data today:      Initial consult on 12/24/2019  Component Date Value  . WBC (White Blood Cell Co* 12/24/2019 5.2   . RBC (Red Blood Cell Coun* 12/24/2019 4.43   . Hemoglobin 12/24/2019 13.5   . Hematocrit 12/24/2019 41.6   . MCV (Mean Corpuscular Vo* 12/24/2019 93.9   . MCH (Mean Corpuscular He* 12/24/2019 30.5   . MCHC (Mean Corpuscular H* 12/24/2019 32.5   . Platelet Count 12/24/2019 262   . RDW-CV (Red Cell Distrib* 12/24/2019 12.5   . MPV (Mean Platelet Volum* 12/24/2019 9.2*  . Neutrophils 12/24/2019 2.66   . Lymphocytes 12/24/2019 1.98   . Monocytes 12/24/2019 0.46   . Eosinophils 12/24/2019 0.08   . Basophils 12/24/2019 0.05   . Neutrophil % 12/24/2019 50.8   . Lymphocyte % 12/24/2019 37.9   . Monocyte % 12/24/2019 8.8   . Eosinophil % 12/24/2019 1.5   . Basophil%  12/24/2019 1.0   . Immature Granulocyte % 12/24/2019 0.0   . Immature Granulocyte Cou* 12/24/2019 0.00      ASSESSMENT: Rose Bernard is a 54 y.o. female presenting for consultation for left breast atypical ductal hyperplasia.    Patient was oriented again about the pathology results. Surgical alternatives were discussed with patient including partial mastectomy. Surgical technique and post operative care was discussed with patient. Risk of surgery was discussed with patient including but not limited to: wound infection, seroma, hematoma, brachial plexopathy, mondor's disease (thrombosis of small veins of breast), chronic wound pain, breast lymphedema, altered sensation to the nipple and cosmesis among others.   Patient oriented about the implication having atypical ductal hyperplasia.  She was oriented that this is not malignancy, this  is a benign lesion but excision is recommended to rule out any upgrade to malignancy.  Also oriented about the possibility of referring her to the cancer center for breast cancer risk reduction alternatives.  Was oriented that she is at increased risk of having breast cancer in the future in either breast due to the history of having atypical hyperplasia.  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 Needle guided excisional biopsy of the left breat (19125) 2. CBC, CMP 3. Avoid aspirin 5 days before surgery 4. Contact us if has any question or concern.  Patient verbalized understanding, all questions were answered, and were agreeable with the plan outlined above.     Herbert Pun, MD  Electronically signed by Herbert Pun, MD

## 2019-12-25 ENCOUNTER — Other Ambulatory Visit: Payer: Self-pay | Admitting: General Surgery

## 2019-12-25 DIAGNOSIS — N6092 Unspecified benign mammary dysplasia of left breast: Secondary | ICD-10-CM

## 2019-12-30 ENCOUNTER — Other Ambulatory Visit: Payer: Self-pay

## 2019-12-30 ENCOUNTER — Encounter
Admission: RE | Admit: 2019-12-30 | Discharge: 2019-12-30 | Disposition: A | Discharge status: Home / Self Care | Payer: BC Managed Care – PPO | Source: Ambulatory Visit | Attending: General Surgery | Admitting: General Surgery

## 2019-12-30 HISTORY — DX: Personal history of urinary calculi: Z87.442

## 2019-12-30 NOTE — Patient Instructions (Addendum)
Your procedure is scheduled on: Wednesday 01/01/20.  Report to [facility] at 7:45.  Remember: Instructions that are not followed completely may result in serious medical risk, up to and including death, or upon the discretion of your surgeon and anesthesiologist your surgery may need to be rescheduled.      _X__ 1. Do not eat food after midnight the night before your procedure.                 No gum chewing or hard candies. You may drink clear liquids up to 2 hours                 before you are scheduled to arrive for your surgery- DO NOT drink clear                 liquids within 2 hours of the start of your surgery.                 Clear Liquids include:  water, apple juice without pulp, clear carbohydrate                 drink such as Clearfast or Gatorade, Black Coffee or Tea (Do not add                 anything to coffee or tea).   __X__2.  On the morning of surgery brush your teeth with toothpaste and water, you may rinse your mouth with mouthwash if you wish.  Do not swallow any toothpaste or mouthwash.      _X__ 3.  No Alcohol for 24 hours before or after surgery.    _X__ 4.  Do Not Smoke or use e-cigarettes For 24 Hours Prior to Your Surgery.                 Do not use any chewable tobacco products for at least 6 hours prior to                 surgery.   __X__5.  Notify your doctor if there is any change in your medical condition      (cold, fever, infections).      Do not wear jewelry, make-up, hairpins, clips or nail polish. Do not wear lotions, powders, deodorant, or perfumes.  Do not shave 48 hours prior to surgery. Men may shave face and neck. Do not bring valuables to the hospital.     [facility] is not responsible for any belongings or valuables.   Contacts, dentures/partials or body piercings may not be worn into surgery. Bring a case for your contacts, glasses or hearing aids, a denture cup will be supplied. Leave your suitcase in the car.  After surgery it may be brought to your room. For patients admitted to the hospital, discharge time is determined by your treatment team.    Patients discharged the day of surgery will not be allowed to drive home.    __X__ Take these medicines the morning of surgery with A SIP OF WATER:     1. NONE      __X__ Use CHG Soap/SAGE wipes as directed    __X__ Stop Anti-inflammatories 7 days before surgery such as Advil, Ibuprofen, Motrin, BC or Goodies Powder, Naprosyn, Naproxen, Aleve, Aspirin, Meloxicam. May take Tylenol if needed for pain or discomfort.     __X__ Don't start taking any new herbal supplements before your surgery.

## 2019-12-31 ENCOUNTER — Other Ambulatory Visit
Admission: RE | Admit: 2019-12-31 | Discharge: 2019-12-31 | Disposition: A | Discharge status: Home / Self Care | Payer: BC Managed Care – PPO | Source: Ambulatory Visit | Attending: General Surgery | Admitting: General Surgery

## 2019-12-31 DIAGNOSIS — U071 COVID-19: Secondary | ICD-10-CM | POA: Diagnosis not present

## 2019-12-31 DIAGNOSIS — Z01812 Encounter for preprocedural laboratory examination: Secondary | ICD-10-CM | POA: Diagnosis not present

## 2019-12-31 LAB — SARS CORONAVIRUS 2 (TAT 6-24 HRS): SARS Coronavirus 2: POSITIVE — AB

## 2019-12-31 NOTE — OR Nursing (Signed)
Dr. Windell Moment notified that this patients Covid test is positive. MD request RN to call and cancel case. Have pt call his office to reschedule in 10 days is fever free and symptom free. Pt verbalized understanding

## 2020-01-01 ENCOUNTER — Telehealth: Payer: Self-pay | Admitting: Internal Medicine

## 2020-01-01 ENCOUNTER — Encounter: Admission: RE | Payer: Self-pay | Source: Home / Self Care

## 2020-01-01 ENCOUNTER — Ambulatory Visit: Admission: RE | Admit: 2020-01-01 | Payer: BC Managed Care – PPO | Source: Home / Self Care | Admitting: General Surgery

## 2020-01-01 ENCOUNTER — Other Ambulatory Visit: Payer: BC Managed Care – PPO | Admitting: Orthotics

## 2020-01-01 ENCOUNTER — Ambulatory Visit: Payer: BC Managed Care – PPO

## 2020-01-01 SURGERY — EXCISION OF BREAST BIOPSY
Anesthesia: General | Site: Breast | Laterality: Left

## 2020-01-01 NOTE — Telephone Encounter (Signed)
Pt called needing a copy of her +covid result. Pt states she needs it emailed to her job.  Email to [email or web address]  Call pt @ [phone]

## 2020-01-02 NOTE — Telephone Encounter (Signed)
Confirmed pt doing ok. No symptoms as of right now. Was tested for surgery. Came back positive. Husband will be tested Saturday. Emailed positive results to email listed below.

## 2020-01-02 NOTE — Telephone Encounter (Signed)
Pt called to check on the status of this  Please contact pt [phone] about covid results

## 2020-01-03 ENCOUNTER — Telehealth: Payer: Self-pay | Admitting: Internal Medicine

## 2020-01-03 NOTE — Telephone Encounter (Signed)
Patient wanting to know if her and her husband can take zinc, vit D, and vit C since they both have covid. Supposed to help with immunity. I have already sent her results to her boss. Confirmed they are both doing ok. No significant symptoms at this time.

## 2020-01-03 NOTE — Telephone Encounter (Signed)
Pt has questions about medication and needs covid results sent to boss. Pt requested call back.

## 2020-01-03 NOTE — Telephone Encounter (Signed)
Studies are being done to see if these supplements help to reduce the risk or severity of covid, but I am ok if they take.

## 2020-01-07 NOTE — Telephone Encounter (Signed)
Pt.notified

## 2020-01-08 ENCOUNTER — Other Ambulatory Visit
Admission: RE | Admit: 2020-01-08 | Discharge: 2020-01-08 | Disposition: A | Discharge status: Home / Self Care | Payer: BC Managed Care – PPO | Source: Ambulatory Visit | Attending: General Surgery | Admitting: General Surgery

## 2020-01-08 NOTE — Pre-Procedure Instructions (Signed)
Pt positive for covid 12-31-2019. No testing required for upcoming surgery.

## 2020-01-09 MED ORDER — CLINDAMYCIN PHOSPHATE 900 MG/50ML IV SOLN
900.0000 mg | INTRAVENOUS | Status: AC
  Administered 2020-01-10: 900 mg via INTRAVENOUS

## 2020-01-10 ENCOUNTER — Other Ambulatory Visit: Payer: Self-pay

## 2020-01-10 ENCOUNTER — Other Ambulatory Visit: Payer: Self-pay | Admitting: General Surgery

## 2020-01-10 ENCOUNTER — Encounter
Admission: RE | Disposition: A | Discharge status: Home / Self Care | Payer: Self-pay | Source: Home / Self Care | Attending: General Surgery

## 2020-01-10 ENCOUNTER — Encounter: Payer: Self-pay | Admitting: General Surgery

## 2020-01-10 ENCOUNTER — Ambulatory Visit
Admission: RE | Admit: 2020-01-10 | Discharge: 2020-01-10 | Disposition: A | Discharge status: Home / Self Care | Payer: BC Managed Care – PPO | Source: Ambulatory Visit | Attending: General Surgery | Admitting: General Surgery

## 2020-01-10 ENCOUNTER — Ambulatory Visit: Payer: BC Managed Care – PPO | Admitting: Anesthesiology

## 2020-01-10 ENCOUNTER — Ambulatory Visit
Admission: RE | Admit: 2020-01-10 | Discharge: 2020-01-10 | Disposition: A | Discharge status: Home / Self Care | Payer: BC Managed Care – PPO | Attending: General Surgery | Admitting: General Surgery

## 2020-01-10 DIAGNOSIS — Z800 Family history of malignant neoplasm of digestive organs: Secondary | ICD-10-CM | POA: Diagnosis not present

## 2020-01-10 DIAGNOSIS — D0512 Intraductal carcinoma in situ of left breast: Secondary | ICD-10-CM | POA: Insufficient documentation

## 2020-01-10 DIAGNOSIS — N6092 Unspecified benign mammary dysplasia of left breast: Secondary | ICD-10-CM

## 2020-01-10 DIAGNOSIS — N6082 Other benign mammary dysplasias of left breast: Secondary | ICD-10-CM | POA: Diagnosis not present

## 2020-01-10 DIAGNOSIS — Z803 Family history of malignant neoplasm of breast: Secondary | ICD-10-CM | POA: Insufficient documentation

## 2020-01-10 DIAGNOSIS — R928 Other abnormal and inconclusive findings on diagnostic imaging of breast: Secondary | ICD-10-CM | POA: Diagnosis not present

## 2020-01-10 DIAGNOSIS — N6323 Unspecified lump in the left breast, lower outer quadrant: Secondary | ICD-10-CM | POA: Diagnosis not present

## 2020-01-10 HISTORY — PX: EXCISION OF BREAST BIOPSY: SHX5822

## 2020-01-10 SURGERY — EXCISION OF BREAST BIOPSY
Anesthesia: General | Site: Breast | Laterality: Left

## 2020-01-10 MED ORDER — BUPIVACAINE HCL (PF) 0.5 % IJ SOLN
INTRAMUSCULAR | Status: AC
  Filled 2020-01-10: qty 30

## 2020-01-10 MED ORDER — FAMOTIDINE 20 MG PO TABS
20.0000 mg | ORAL_TABLET | Freq: Once | ORAL | Status: AC
  Administered 2020-01-10: 20 mg via ORAL

## 2020-01-10 MED ORDER — LACTATED RINGERS IV SOLN: INTRAVENOUS | Status: DC

## 2020-01-10 MED ORDER — MIDAZOLAM HCL 2 MG/2ML IJ SOLN
INTRAMUSCULAR | Status: AC
  Filled 2020-01-10: qty 2

## 2020-01-10 MED ORDER — LIDOCAINE HCL (CARDIAC) PF 100 MG/5ML IV SOSY
PREFILLED_SYRINGE | INTRAVENOUS | Status: DC | PRN
  Administered 2020-01-10: 60 mg via INTRAVENOUS

## 2020-01-10 MED ORDER — BUPIVACAINE-EPINEPHRINE (PF) 0.5% -1:200000 IJ SOLN
INTRAMUSCULAR | Status: DC | PRN
  Administered 2020-01-10: 30 mL

## 2020-01-10 MED ORDER — PROPOFOL 10 MG/ML IV BOLUS
INTRAVENOUS | Status: DC | PRN
Start: 2020-01-10 — End: 2020-01-10
  Administered 2020-01-10: 50 mg via INTRAVENOUS
  Administered 2020-01-10: 100 mg via INTRAVENOUS
  Administered 2020-01-10: 50 mg via INTRAVENOUS

## 2020-01-10 MED ORDER — EPINEPHRINE PF 1 MG/ML IJ SOLN
INTRAMUSCULAR | Status: AC
  Filled 2020-01-10: qty 1

## 2020-01-10 MED ORDER — ONDANSETRON HCL 4 MG/2ML IJ SOLN
INTRAMUSCULAR | Status: DC | PRN
  Administered 2020-01-10: 4 mg via INTRAVENOUS

## 2020-01-10 MED ORDER — ONDANSETRON HCL 4 MG/2ML IJ SOLN: 4.0000 mg | Freq: Once | INTRAMUSCULAR | Status: DC | PRN

## 2020-01-10 MED ORDER — FENTANYL CITRATE (PF) 100 MCG/2ML IJ SOLN: 25.0000 ug | INTRAMUSCULAR | Status: DC | PRN

## 2020-01-10 MED ORDER — FAMOTIDINE 20 MG PO TABS
ORAL_TABLET | ORAL | Status: AC
  Filled 2020-01-10: qty 1

## 2020-01-10 MED ORDER — LIDOCAINE HCL (PF) 2 % IJ SOLN
INTRAMUSCULAR | Status: AC
  Filled 2020-01-10: qty 10

## 2020-01-10 MED ORDER — HYDROCODONE-ACETAMINOPHEN 5-325 MG PO TABS: 1.0000 | ORAL_TABLET | ORAL | 0 refills | Status: AC | PRN

## 2020-01-10 MED ORDER — EPHEDRINE SULFATE 50 MG/ML IJ SOLN
INTRAMUSCULAR | Status: DC | PRN
  Administered 2020-01-10: 10 mg via INTRAVENOUS
  Administered 2020-01-10 (×3): 5 mg via INTRAVENOUS

## 2020-01-10 MED ORDER — DEXAMETHASONE SODIUM PHOSPHATE 10 MG/ML IJ SOLN
INTRAMUSCULAR | Status: DC | PRN
Start: 2020-01-10 — End: 2020-01-10
  Administered 2020-01-10: 10 mg via INTRAVENOUS

## 2020-01-10 MED ORDER — KETOROLAC TROMETHAMINE 30 MG/ML IJ SOLN
INTRAMUSCULAR | Status: DC | PRN
  Administered 2020-01-10: 30 mg via INTRAVENOUS

## 2020-01-10 MED ORDER — PHENYLEPHRINE HCL (PRESSORS) 10 MG/ML IV SOLN
INTRAVENOUS | Status: DC | PRN
  Administered 2020-01-10: 50 ug via INTRAVENOUS
  Administered 2020-01-10: 200 ug via INTRAVENOUS
  Administered 2020-01-10 (×4): 100 ug via INTRAVENOUS
  Administered 2020-01-10: 200 ug via INTRAVENOUS
  Administered 2020-01-10: 100 ug via INTRAVENOUS

## 2020-01-10 MED ORDER — FENTANYL CITRATE (PF) 100 MCG/2ML IJ SOLN
INTRAMUSCULAR | Status: DC | PRN
  Administered 2020-01-10: 50 ug via INTRAVENOUS
  Administered 2020-01-10 (×2): 25 ug via INTRAVENOUS

## 2020-01-10 MED ORDER — PROPOFOL 10 MG/ML IV BOLUS
INTRAVENOUS | Status: AC
  Filled 2020-01-10: qty 20

## 2020-01-10 MED ORDER — MIDAZOLAM HCL 2 MG/2ML IJ SOLN
INTRAMUSCULAR | Status: DC | PRN
  Administered 2020-01-10: 2 mg via INTRAVENOUS

## 2020-01-10 MED ORDER — FENTANYL CITRATE (PF) 100 MCG/2ML IJ SOLN
INTRAMUSCULAR | Status: AC
  Filled 2020-01-10: qty 2

## 2020-01-10 MED ORDER — ONDANSETRON HCL 4 MG/2ML IJ SOLN
INTRAMUSCULAR | Status: AC
  Filled 2020-01-10: qty 2

## 2020-01-10 MED ORDER — CLINDAMYCIN PHOSPHATE 900 MG/50ML IV SOLN
INTRAVENOUS | Status: AC
  Filled 2020-01-10: qty 50

## 2020-01-10 SURGICAL SUPPLY — 41 items
BLADE SURG 15 STRL LF DISP TIS (BLADE) ×1 IMPLANT
BLADE SURG 15 STRL SS (BLADE) ×2
CANISTER SUCT 1200ML W/VALVE (MISCELLANEOUS) ×3 IMPLANT
CHLORAPREP W/TINT 26 (MISCELLANEOUS) ×3 IMPLANT
CNTNR SPEC 2.5X3XGRAD LEK (MISCELLANEOUS) ×1
CONT SPEC 4OZ STER OR WHT (MISCELLANEOUS) ×2
CONTAINER SPEC 2.5X3XGRAD LEK (MISCELLANEOUS) ×1 IMPLANT
COVER WAND RF STERILE (DRAPES) ×3 IMPLANT
DERMABOND ADVANCED (GAUZE/BANDAGES/DRESSINGS) ×2
DERMABOND ADVANCED .7 DNX12 (GAUZE/BANDAGES/DRESSINGS) ×1 IMPLANT
DEVICE DUBIN SPECIMEN MAMMOGRA (MISCELLANEOUS) ×3 IMPLANT
DRAPE LAPAROTOMY TRNSV 106X77 (MISCELLANEOUS) ×3 IMPLANT
ELECT CAUTERY BLADE TIP 2.5 (TIP) ×3
ELECT REM PT RETURN 9FT ADLT (ELECTROSURGICAL) ×3
ELECTRODE CAUTERY BLDE TIP 2.5 (TIP) ×1 IMPLANT
ELECTRODE REM PT RTRN 9FT ADLT (ELECTROSURGICAL) ×1 IMPLANT
GLOVE BIO SURGEON STRL SZ 6.5 (GLOVE) ×2 IMPLANT
GLOVE BIO SURGEONS STRL SZ 6.5 (GLOVE) ×1
GLOVE BIOGEL PI IND STRL 6.5 (GLOVE) ×1 IMPLANT
GLOVE BIOGEL PI INDICATOR 6.5 (GLOVE) ×2
GOWN STRL REUS W/ TWL LRG LVL3 (GOWN DISPOSABLE) ×3 IMPLANT
GOWN STRL REUS W/TWL LRG LVL3 (GOWN DISPOSABLE) ×6
KIT MARKER MARGIN INK (KITS) ×3 IMPLANT
KIT TURNOVER KIT A (KITS) ×3 IMPLANT
LABEL OR SOLS (LABEL) ×3 IMPLANT
MARGIN MAP 10MM (MISCELLANEOUS) ×3 IMPLANT
NEEDLE HYPO 25X1 1.5 SAFETY (NEEDLE) ×3 IMPLANT
PACK BASIN MINOR ARMC (MISCELLANEOUS) ×3 IMPLANT
RETRACTOR RING XSMALL (MISCELLANEOUS) IMPLANT
RTRCTR WOUND ALEXIS 13CM XS SH (MISCELLANEOUS)
SUT ETHILON 3-0 FS-10 30 BLK (SUTURE) ×3
SUT MNCRL 4-0 (SUTURE) ×2
SUT MNCRL 4-0 27XMFL (SUTURE) ×1
SUT SILK 2 0 SH (SUTURE) ×3 IMPLANT
SUT VIC AB 3-0 SH 27 (SUTURE) ×2
SUT VIC AB 3-0 SH 27X BRD (SUTURE) ×1 IMPLANT
SUTURE EHLN 3-0 FS-10 30 BLK (SUTURE) ×1 IMPLANT
SUTURE MNCRL 4-0 27XMF (SUTURE) ×1 IMPLANT
SYR 10ML LL (SYRINGE) ×3 IMPLANT
SYR BULB IRRIG 60ML STRL (SYRINGE) ×3 IMPLANT
WATER STERILE IRR 1000ML POUR (IV SOLUTION) ×3 IMPLANT

## 2020-01-10 NOTE — Anesthesia Preprocedure Evaluation (Addendum)
Anesthesia Evaluation  Patient identified by MRN, date of birth, ID band Patient awake    Reviewed: Allergy & Precautions, NPO status , Patient's Chart, lab work & pertinent test results  History of Anesthesia Complications Negative for: history of anesthetic complications  Airway Mallampati: II       Dental   Pulmonary neg sleep apnea, neg COPD, Not current smoker,           Cardiovascular (-) hypertension(-) Past MI and (-) CHF (-) dysrhythmias (-) Valvular Problems/Murmurs     Neuro/Psych neg Seizures    GI/Hepatic Neg liver ROS, neg GERD  ,  Endo/Other  neg diabetes  Renal/GU Renal disease (stones)     Musculoskeletal   Abdominal   Peds  Hematology   Anesthesia Other Findings   Reproductive/Obstetrics                            Anesthesia Physical Anesthesia Plan  ASA: II  Anesthesia Plan:    Post-op Pain Management:    Induction:   PONV Risk Score and Plan: 2 and Ondansetron  Airway Management Planned: LMA  Additional Equipment:   Intra-op Plan:   Post-operative Plan:   Informed Consent: I have reviewed the patients History and Physical, chart, labs and discussed the procedure including the risks, benefits and alternatives for the proposed anesthesia with the patient or authorized representative who has indicated his/her understanding and acceptance.       Plan Discussed with:   Anesthesia Plan Comments:         Anesthesia Quick Evaluation

## 2020-01-10 NOTE — Discharge Instructions (Addendum)
°  Diet: Resume home heart healthy regular diet.  ° °Activity: Increase activity as tolerated. Light activity and walking are encouraged. Do not drive or drink alcohol if taking narcotic pain medications. ° °Wound care: May shower with soapy water and pat dry (do not rub incisions), but no baths or submerging incision underwater until follow-up. (no swimming)  ° °Medications: Resume all home medications. For mild to moderate pain: acetaminophen (Tylenol) or ibuprofen (if no kidney disease). Combining Tylenol with alcohol can substantially increase your risk of causing liver disease. Narcotic pain medications, if prescribed, can be used for severe pain, though may cause nausea, constipation, and drowsiness. Do not combine Tylenol and Norco within a 6 hour period as Norco contains Tylenol. If you do not need the narcotic pain medication, you do not need to fill the prescription. ° °Call office ([phone]) at any time if any questions, worsening pain, fevers/chills, bleeding, drainage from incision site, or other concerns. ° °AMBULATORY SURGERY  °DISCHARGE INSTRUCTIONS ° ° °1) The drugs that you were given will stay in your system until tomorrow so for the next 24 hours you should not: ° °A) Drive an automobile °B) Make any legal decisions °C) Drink any alcoholic beverage ° ° °2) You may resume regular meals tomorrow.  Today it is better to start with liquids and gradually work up to solid foods. ° °You may eat anything you prefer, but it is better to start with liquids, then soup and crackers, and gradually work up to solid foods. ° ° °3) Please notify your doctor immediately if you have any unusual bleeding, trouble breathing, redness and pain at the surgery site, drainage, fever, or pain not relieved by medication. ° ° ° °4) Additional Instructions: ° ° ° ° ° ° ° °Please contact your physician with any problems or Same Day Surgery at [phone], Monday through Friday 6 am to 4 pm, or [facility]  number at [phone]. °

## 2020-01-10 NOTE — Anesthesia Procedure Notes (Signed)
Procedure Name: LMA Insertion Date/Time: 01/10/2020 3:29 PM Performed by: Caryl Asp, CRNA Pre-anesthesia Checklist: Patient identified, Patient being monitored, Timeout performed, Emergency Drugs available and Suction available Patient Re-evaluated:Patient Re-evaluated prior to induction Oxygen Delivery Method: Circle system utilized Preoxygenation: Pre-oxygenation with 100% oxygen Induction Type: IV induction Ventilation: Mask ventilation without difficulty LMA: LMA inserted LMA Size: 4.0 Tube type: Oral Number of attempts: 1 Placement Confirmation: positive ETCO2 and breath sounds checked- equal and bilateral Tube secured with: Tape Dental Injury: Teeth and Oropharynx as per pre-operative assessment

## 2020-01-10 NOTE — Transfer of Care (Signed)
Immediate Anesthesia Transfer of Care Note  Patient: Rose Bernard  Procedure(s) Performed: EXCISION OF BREAST BIOPSY w/ Needle localization (Left Breast)  Patient Location: PACU  Anesthesia Type:General  Level of Consciousness: sedated  Airway & Oxygen Therapy: Patient Spontanous Breathing and Patient connected to face mask oxygen  Post-op Assessment: Report given to RN and Post -op Vital signs reviewed and stable  Post vital signs: Reviewed and stable  Last Vitals:  Vitals Value Taken Time  BP 125/57 01/10/20 1633  Temp    Pulse 86 01/10/20 1634  Resp 20 01/10/20 1634  SpO2 100 % 01/10/20 1634  Vitals shown include unvalidated device data.  Last Pain:  Vitals:   01/10/20 1421  TempSrc: Temporal  PainSc: 0-No pain         Complications: No apparent anesthesia complications

## 2020-01-10 NOTE — Interval H&P Note (Signed)
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  The various methods of treatment have been discussed with the patient and family. After consideration of risks, benefits and other options for treatment, the patient has consented to  Procedure(s): EXCISION OF BREAST BIOPSY w/ Needle localization (Left) as a surgical intervention.  The patient's history has been reviewed, patient examined, no change in status, stable for surgery.  I have reviewed the patient's chart and labs.  Left chest marked in the pre procedure room. Questions were answered to the patient's satisfaction.     Herbert Pun

## 2020-01-10 NOTE — Op Note (Signed)
Preoperative diagnosis: Left breast atypical ductal hyperplasia.  Postoperative diagnosis: Left breast atypical ductal hyperplasia.   Procedure: Left needle-localized excisional biopsy                      Anesthesia: GETA  Surgeon: Dr. Windell Moment  Wound Classification: Clean  Indications: Patient is a 54 y.o. female with a nonpalpable left breast mass noted on mammography with core biopsy demonstrating atypical ductal hyperplasia requires needle-localized excisional biopsy to rule out malignancy.   Findings: 1. Specimen mammography shows marker and wire on specimen 2. Pathology call refers gross examination of margins was grossly negative 3. No other palpable mass or lymph node identified.   Description of procedure: Preoperative needle localization was performed by radiology.  Localization studies were reviewed. The patient was taken to the operating room and placed supine on the operating table, and after general anesthesia the left chest and axilla were prepped and draped in the usual sterile fashion. A time-out was completed verifying correct patient, procedure, site, positioning, and implant(s) and/or special equipment prior to beginning this procedure.  By comparing the localization studies with the direction and skin entry site of the needle, the probable trajectory and location of the mass was visualized. A circumareolar skin incision was planned in such a way as to minimize the amount of dissection to reach the mass.  The skin incision was made. Flaps were raised and the location of the wire confirmed. The wire was delivered into the wound. A 2-0 silk figure-of-eight stay suture was placed around the wire and used for retraction. Dissection was then taken down circumferentially, taking care to include the entire localizing needle and a wide margin of grossly normal tissue. The specimen and entire localizing wire were removed. The specimen was oriented and sent to radiology with the  localization studies. Confirmation was received that the entire target lesion had been resected. The wound was irrigated. Hemostasis was checked. The wound was closed with interrupted sutures of 3-0 Vicryl and a subcuticular suture of Monocryl 4-0. No attempt was made to close the dead space. A dressing was applied.  The patient tolerated the procedure well and was taken to the postanesthesia care unit in stable condition.   Specimen: Left Breast excisional biopsy                Complications: None  Estimated Blood Loss: 5 mL

## 2020-01-11 NOTE — Anesthesia Postprocedure Evaluation (Signed)
Anesthesia Post Note  Patient: Rose Bernard  Procedure(s) Performed: EXCISION OF BREAST BIOPSY w/ Needle localization (Left Breast)  Patient location during evaluation: PACU Anesthesia Type: General Level of consciousness: awake and alert Pain management: pain level controlled Vital Signs Assessment: post-procedure vital signs reviewed and stable Respiratory status: spontaneous breathing, nonlabored ventilation, respiratory function stable and patient connected to nasal cannula oxygen Cardiovascular status: blood pressure returned to baseline and stable Postop Assessment: no apparent nausea or vomiting Anesthetic complications: no     Last Vitals:  Vitals:   01/10/20 1717 01/10/20 1721  BP:  120/72  Pulse: 89 85  Resp: (!) 21 18  Temp: (!) 36.4 C (!) 36.2 C  SpO2: 99% 100%    Last Pain:  Vitals:   01/10/20 1721  TempSrc: Temporal  PainSc: 0-No pain                 Martha Clan

## 2020-01-16 ENCOUNTER — Other Ambulatory Visit: Payer: Self-pay | Admitting: Anatomic Pathology & Clinical Pathology

## 2020-01-20 ENCOUNTER — Encounter: Payer: Self-pay | Admitting: Oncology

## 2020-01-20 NOTE — Progress Notes (Signed)
Pt contacted for appt on 2/9. Referred by Dr. Peyton Najjar for malignant neoplasm of breast. Pt had surgery to left breast on 1/29 and states "it is just sore." No other concerns. Chart reviewed and updated with patient.

## 2020-01-21 ENCOUNTER — Other Ambulatory Visit: Payer: Self-pay

## 2020-01-21 ENCOUNTER — Inpatient Hospital Stay: Payer: BC Managed Care – PPO | Attending: Oncology | Admitting: Oncology

## 2020-01-21 ENCOUNTER — Encounter: Payer: Self-pay | Admitting: *Deleted

## 2020-01-21 ENCOUNTER — Inpatient Hospital Stay: Payer: BC Managed Care – PPO

## 2020-01-21 ENCOUNTER — Encounter: Payer: Self-pay | Admitting: Oncology

## 2020-01-21 VITALS — BP 131/69 | HR 69 | Temp 96.6°F | Resp 16 | Ht 67.32 in | Wt 140.0 lb

## 2020-01-21 DIAGNOSIS — Z79810 Long term (current) use of selective estrogen receptor modulators (SERMs): Secondary | ICD-10-CM | POA: Insufficient documentation

## 2020-01-21 DIAGNOSIS — Z006 Encounter for examination for normal comparison and control in clinical research program: Secondary | ICD-10-CM

## 2020-01-21 DIAGNOSIS — Z7189 Other specified counseling: Secondary | ICD-10-CM | POA: Diagnosis not present

## 2020-01-21 DIAGNOSIS — D0512 Intraductal carcinoma in situ of left breast: Secondary | ICD-10-CM | POA: Insufficient documentation

## 2020-01-21 DIAGNOSIS — Z90710 Acquired absence of both cervix and uterus: Secondary | ICD-10-CM | POA: Diagnosis not present

## 2020-01-21 DIAGNOSIS — Z809 Family history of malignant neoplasm, unspecified: Secondary | ICD-10-CM

## 2020-01-21 LAB — COMPREHENSIVE METABOLIC PANEL
ALT: 20 U/L (ref 0–44)
AST: 17 U/L (ref 15–41)
Albumin: 4.1 g/dL (ref 3.5–5.0)
Alkaline Phosphatase: 61 U/L (ref 38–126)
Anion gap: 9 (ref 5–15)
BUN: 17 mg/dL (ref 6–20)
CO2: 28 mmol/L (ref 22–32)
Calcium: 8.7 mg/dL — ABNORMAL LOW (ref 8.9–10.3)
Chloride: 103 mmol/L (ref 98–111)
Creatinine, Ser: 0.83 mg/dL (ref 0.44–1.00)
GFR calc Af Amer: >60 mL/min (ref >60)
GFR calc non Af Amer: >60 mL/min (ref >60)
Glucose, Bld: 89 mg/dL (ref 70–99)
Potassium: 4 mmol/L (ref 3.5–5.1)
Sodium: 140 mmol/L (ref 135–145)
Total Bilirubin: 0.5 mg/dL (ref 0.3–1.2)
Total Protein: 7.5 g/dL (ref 6.5–8.1)

## 2020-01-21 LAB — CBC WITH DIFFERENTIAL/PLATELET
Abs Immature Granulocytes: 0.01 10*3/uL (ref 0.00–0.07)
Basophils Absolute: 0.1 10*3/uL (ref 0.0–0.1)
Basophils Relative: 1 %
Eosinophils Absolute: 0.1 10*3/uL (ref 0.0–0.5)
Eosinophils Relative: 2 %
HCT: 40.3 % (ref 36.0–46.0)
Hemoglobin: 12.7 g/dL (ref 12.0–15.0)
Immature Granulocytes: 0 %
Lymphocytes Relative: 27 %
Lymphs Abs: 1.2 10*3/uL (ref 0.7–4.0)
MCH: 29.8 pg (ref 26.0–34.0)
MCHC: 31.5 g/dL (ref 30.0–36.0)
MCV: 94.6 fL (ref 80.0–100.0)
Monocytes Absolute: 0.5 10*3/uL (ref 0.1–1.0)
Monocytes Relative: 11 %
Neutro Abs: 2.6 10*3/uL (ref 1.7–7.7)
Neutrophils Relative %: 59 %
Platelets: 283 10*3/uL (ref 150–400)
RBC: 4.26 MIL/uL (ref 3.87–5.11)
RDW: 12.9 % (ref 11.5–15.5)
WBC: 4.4 10*3/uL (ref 4.0–10.5)
nRBC: 0 % (ref 0.0–0.2)

## 2020-01-21 NOTE — Progress Notes (Signed)
Met patient during her initial medical oncology with Dr. Tasia Catchings.  Gave patient breast cancer educational literature, "My Breast Cancer Treatment Handbook" by Josephine Igo, RN.  She is to call with any questions or needs.

## 2020-01-21 NOTE — Progress Notes (Signed)
Hematology/Oncology Consult note [facility] Telephone:([phone] [phone]   Patient Care Team: Einar Pheasant, MD as PCP - General (Internal Medicine) Einar Pheasant, MD (Internal Medicine) Bary Castilla, Forest Gleason, MD (General Surgery)  REFERRING PROVIDER: Herbert Pun, *  CHIEF COMPLAINTS/REASON FOR VISIT:  Evaluation of DCIS  HISTORY OF PRESENTING ILLNESS:   Rose Bernard is a  54 y.o.  female with PMH listed below was seen in consultation at the request of  Herbert Pun, *  for evaluation of DCIS. 10/15/2019 screening bilateral mammogram showed possible left breast mass and calcification. 10/25/2019 patient had unilateral diagnostic mammogram of left breast which showed indeterminate mass in the left breast at 4:00.  Patient underwent ultrasound-guided biopsy of the left breast mass 12/12/2019 left breast ultrasound-guided biopsy showed atypical ductal hyperplastic. Patient was evaluated by Dr. Peyton Najjar and had left breast mass excisional biopsy on 01/10/2020. Pathology showed DCIS, nuclear grade 1-2, background columnar cell changes, papillary change, fibrocystic changes.  Size of the DCIS 21 mm, margins positive for DCIS in the anterior and inferior multifocal.  Patient was referred to cancer center for discussion of further management. Today she denies any new complaints.  Some soreness at the site of excisional biopsy. Menarche age 39. Patient had a hysterectomy at age of 62. She takes estradiol for hot flash symptoms for about a year half. Family history positive for maternal aunt and paternal aunt were both diagnosed with breast cancer, her father was diagnosed with colon cancer.  She had remote breast biopsy in 1991.   Denies any chest radiation..   Review of Systems  Constitutional: Negative for appetite change, chills, fatigue and fever.  HENT:   Negative for hearing loss and voice change.   Eyes: Negative for eye  problems.  Respiratory: Negative for chest tightness and cough.   Cardiovascular: Negative for chest pain.  Gastrointestinal: Negative for abdominal distention, abdominal pain and blood in stool.  Endocrine: Negative for hot flashes.  Genitourinary: Negative for difficulty urinating and frequency.   Musculoskeletal: Negative for arthralgias.  Skin: Negative for itching and rash.  Neurological: Negative for extremity weakness.  Hematological: Negative for adenopathy.  Psychiatric/Behavioral: Negative for confusion.    MEDICAL HISTORY:  Past Medical History:  Diagnosis Date  . Chronic cystitis   . Frequent UTI   . GERD (gastroesophageal reflux disease)   . History of kidney stones   . Incomplete bladder emptying   . 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 ABDOMINAL HYSTERECTOMY  1995   2/2 delivery complications with 3rd child. Hysterectomy and bladder repair with subsequent appendectomy. L Ovary remain in place   . APPENDECTOMY     2001 due to scar tissue  . BLADDER BOTOX  2014, 2015, 2016  . BREAST BIOPSY Bilateral 1991  . BREAST BIOPSY Left 12/12/2019   [place] bx, path pending  . BREAST SURGERY Bilateral 2002   benign mass removed  . COLONOSCOPY  2014  . EXCISION OF BREAST BIOPSY Left 01/10/2020   Procedure: EXCISION OF BREAST BIOPSY w/ Needle localization;  Surgeon: Herbert Pun, MD;  Location: [facility] ORS;  Service: General;  Laterality: Left;  . FOOT SURGERY Bilateral   . LAPAROSCOPIC HYSTERECTOMY  1995  . reconstruction of bladder   1995    2/2 delivery complications with 3rd child     SOCIAL HISTORY: Social History   Socioeconomic History  . Marital status: Married    Spouse  name: Not on file  . Number of children: 2  . Years of education: Not on file  . Highest education level: Not on file  Occupational History  . Not on file  Tobacco Use  . Smoking status: Never  Smoker  . Smokeless tobacco: Never Used  Substance and Sexual Activity  . Alcohol use: No    Alcohol/week: 0.0 standard drinks  . Drug use: No  . Sexual activity: Yes    Birth control/protection: Surgical  Other Topics Concern  . Not on file  Social History Narrative  . Not on file   Social Determinants of Health   Financial Resource Strain:   . Difficulty of Paying Living Expenses: Not on file  Food Insecurity:   . Worried About Charity fundraiser in the Last Year: Not on file  . Ran Out of Food in the Last Year: Not on file  Transportation Needs:   . Lack of Transportation (Medical): Not on file  . Lack of Transportation (Non-Medical): Not on file  Physical Activity:   . Days of Exercise per Week: Not on file  . Minutes of Exercise per Session: Not on file  Stress:   . Feeling of Stress : Not on file  Social Connections:   . Frequency of Communication with Friends and Family: Not on file  . Frequency of Social Gatherings with Friends and Family: Not on file  . Attends Religious Services: Not on file  . Active Member of Clubs or Organizations: Not on file  . Attends Archivist Meetings: Not on file  . Marital Status: Not on file  Intimate Partner Violence:   . Fear of Current or Ex-Partner: Not on file  . Emotionally Abused: Not on file  . Physically Abused: Not on file  . Sexually Abused: Not on file    FAMILY HISTORY: Family History  Problem Relation Age of Onset  . Colon cancer Father        With ostomy in place   . Breast cancer Paternal Aunt 51  . Breast cancer Maternal Aunt   . Breast cancer Maternal Aunt     ALLERGIES:  is allergic to penicillin v potassium.  MEDICATIONS:  Current Outpatient Medications  Medication Sig Dispense Refill  . esomeprazole (NEXIUM) 20 MG capsule Take 20 mg by mouth daily at 12 noon.    Marland Kitchen estradiol (ESTRACE) 2 MG tablet Take 2 mg by mouth at bedtime.     Marland Kitchen LORazepam (ATIVAN) 1 MG tablet Take 1 tablet (1 mg total)  by mouth every 8 (eight) hours. (Patient not taking: Reported on 12/25/2019) 1 tablet 0   No current facility-administered medications for this visit.     PHYSICAL EXAMINATION: ECOG PERFORMANCE STATUS: 0 - Asymptomatic Vitals:   01/21/20 0922  BP: 131/69  Pulse: 69  Resp: 16  Temp: (!) 96.6 F (35.9 C)   Filed Weights   01/21/20 0922  Weight: 140 lb (63.5 kg)    Physical Exam Constitutional:      General: She is not in acute distress. HENT:     Head: Normocephalic and atraumatic.  Eyes:     General: No scleral icterus. Cardiovascular:     Rate and Rhythm: Normal rate and regular rhythm.     Heart sounds: Normal heart sounds.  Pulmonary:     Effort: Pulmonary effort is normal. No respiratory distress.     Breath sounds: No wheezing.  Abdominal:     General: Bowel sounds are normal. There  is no distension.     Palpations: Abdomen is soft.  Musculoskeletal:        General: No deformity. Normal range of motion.     Cervical back: Normal range of motion and neck supple.  Skin:    General: Skin is warm and dry.     Findings: No erythema or rash.  Neurological:     Mental Status: She is alert and oriented to person, place, and time. Mental status is at baseline.     Cranial Nerves: No cranial nerve deficit.     Coordination: Coordination normal.  Psychiatric:        Mood and Affect: Mood normal.   Breast exam was performed in seated and lying down position. Patient is status post lumpectomy with healing surgical scar left outer lower quadrant breast..  No palpable mass bilaterally.  No palpable axillary lymphadenopathy bilaterally.   LABORATORY DATA:  I have reviewed the data as listed Lab Results  Component Value Date   WBC 4.4 01/21/2020   HGB 12.7 01/21/2020   HCT 40.3 01/21/2020   MCV 94.6 01/21/2020   PLT 283 01/21/2020   Recent Labs    01/21/20 1044  NA 140  K 4.0  CL 103  CO2 28  GLUCOSE 89  BUN 17  CREATININE 0.83  CALCIUM 8.7*  GFRNONAA >60   GFRAA >60  PROT 7.5  ALBUMIN 4.1  AST 17  ALT 20  ALKPHOS 61  BILITOT 0.5   Iron/TIBC/Ferritin/ %Sat    Component Value Date/Time   IRON 94 04/20/2018 1611   TIBC 360 04/20/2018 1611   FERRITIN 11 04/20/2018 1611   IRONPCTSAT 26 04/20/2018 1611      RADIOGRAPHIC STUDIES: I have personally reviewed the radiological images as listed and agreed with the findings in the report.  MM Breast Surgical Specimen  Result Date: 01/10/2020 CLINICAL DATA:  Specimen radiograph status post left breast lumpectomy. EXAM: SPECIMEN RADIOGRAPH OF THE LEFT BREAST COMPARISON:  Previous exam(s). FINDINGS: Status post excision of the left breast. The wire tip and biopsy marker clip are present and are marked for pathology. These findings were communicated to Dr. Windell Moment in the OR at 4:08 p.m. IMPRESSION: Specimen radiograph of the left breast. Electronically Signed   By: Ammie Ferrier M.D.   On: 01/10/2020 16:09   MM DIAG BREAST TOMO UNI LEFT  Result Date: 01/10/2020 CLINICAL DATA:  Mammogram status post wire localization of the left breast. EXAM: 3D DIAGNOSTIC LEFT MAMMOGRAM POST ULTRASOUND GUIDED WIRE LOCALIZATION COMPARISON:  Previous exam(s). FINDINGS: 3D Mammographic images were obtained following ultrasound guided wire localization of a mass at the coil shaped biopsy marking clip in the lower outer left breast. The wire is appropriately positioned with the thick portion of the wire adjacent to the coil shaped biopsy marker. IMPRESSION: Appropriate positioning of the localization wire at the site of the coil shaped biopsy marking clip in the lower outer left breast. Final Assessment: Post Procedure Mammograms for Marker Placement Electronically Signed   By: Ammie Ferrier M.D.   On: 01/10/2020 13:52   [place] LT PLC BREAST LOC DEV   1ST LESION  INC US GUIDE  Result Date: 01/10/2020 CLINICAL DATA:  54 year old female presenting for ultrasound-guided wire localization of the left breast for  excision all biopsy. EXAM: NEEDLE LOCALIZATION OF THE LEFT BREAST WITH MAMMO GUIDANCE COMPARISON:  Previous exams. FINDINGS: Patient presents for needle localization prior to excisional biopsy of the left breast. I met with the patient and we discussed  the procedure of needle localization including benefits and alternatives. We discussed the high likelihood of a successful procedure. We discussed the risks of the procedure, including infection, bleeding, tissue injury, and further surgery. Informed, written consent was given. The usual time-out protocol was performed immediately prior to the procedure. Using mammographic guidance, sterile technique, 1% lidocaine and a 5 cm modified Kopans needle, the mass in the left breast at 4 o'clock, 2 cm from the nipple localized using lateral approach. The images were marked for Dr. Windell Moment. IMPRESSION: Needle localization left breast. No apparent complications. Electronically Signed   By: Ammie Ferrier M.D.   On: 01/10/2020 13:48      ASSESSMENT & PLAN:  1. Ductal carcinoma in situ (DCIS) of left breast   2. Goals of care, counseling/discussion   3. Family history of cancer   4. Evaluated for clinical trial enrollment   Image was independently reviewed by me and discussed with patient and husband.  Pathology was reviewed and discussed. LL okay a patient has a positive DCIS margin.  Patient's case was discussed on multidisciplinary breast tumor board. Per Dr. Peyton Najjar, she has a small breast and reexcision may lead to mastectomy. I discussed the option of Comet trial which she is eligible. We discussed that DCIS is a non invasive breast cancer,  cancer is only in the duct and has not spread into the tissues around it. A substantial number of DCIS lesions will never form a health hazard, particularly if it concerns slow-growing low-risk DCIS (grade I and II). This implies that many women might be unnecessarily going through intensive treatment resulting  in a decrease in quality of life and an increase in health care costs, without any survival benefit.Discussed about COMET trial which is designed to explore the management of low-risk DCIS using an active surveillance (AS) approach  Patient will have 50% chance being radomized to surveillance approach arm or standard management arm Patient and husband appreciate discussion and would like to talk to research RN. Patient discussed with research RN.  And decided to consider the trial and update [place].  I advised patient to stop estradiol today.  I will check FSH, LH, estradiol level.  Given that she has been on hormone replacement therapy, for her adjuvant endocrine treatment, she will be started on tamoxifen. Discussed with patient in details about the rationale of  tamoxifen and potential side effects including but not limited to hot flash, joint pain, blood clots.  Etc.  Patient had a history of hysterectomy.  Patient called around 4:30 PM and wants to update me about her decision. I called patient and talk to her for another 20 minutes and answered all her questions.  She tells me that she is willing to participate in the trial.  I will let the research nurse know.  Further management plan depends on which experimental arm she will be randomized to. Goal of care, curative.  Family history of cancer, discussed with patient about genetic testing.  She is interested.  Will refer to genetic counselor.  Orders Placed This Encounter  Procedures  . CBC with Differential/Platelet    Standing Status:   Future    Number of Occurrences:   1    Standing Expiration Date:   01/20/2021  . Comprehensive metabolic panel    Standing Status:   Future    Number of Occurrences:   1    Standing Expiration Date:   01/20/2021  . Follicle Stimulating Hormone    Standing Status:  Future    Number of Occurrences:   1    Standing Expiration Date:   01/20/2021  . Luteinizing hormone    Standing Status:   Future    Number  of Occurrences:   1    Standing Expiration Date:   01/20/2021  . Estradiol    Standing Status:   Future    Number of Occurrences:   1    Standing Expiration Date:   01/20/2021  . Ambulatory referral to Radiation Oncology    Referral Priority:   Routine    Referral Type:   Consultation    Referral Reason:   Specialty Services Required    Referred to Provider:   Noreene Filbert, MD    Requested Specialty:   Radiation Oncology    Number of Visits Requested:   1  . Ambulatory referral to Genetics    Referral Priority:   Routine    Referral Type:   Consultation    Referral Reason:   Specialty Services Required    Referred to Provider:   Faith Rogue T    Number of Visits Requested:   1    All questions were answered. The patient knows to call the clinic with any problems questions or concerns.  cc Herbert Pun, *    Return of visit: To be determined Thank you for this kind referral and the opportunity to participate in the care of this patient. A copy of today's note is routed to referring provider    Earlie Server, MD, PhD Hematology Oncology [facility] Pager- [phone] 01/21/2020

## 2020-01-22 DIAGNOSIS — D0512 Intraductal carcinoma in situ of left breast: Secondary | ICD-10-CM

## 2020-01-22 LAB — ESTRADIOL: Estradiol: 142 pg/mL

## 2020-01-22 LAB — FOLLICLE STIMULATING HORMONE: FSH: 49.9 m[IU]/mL

## 2020-01-22 LAB — LUTEINIZING HORMONE: LH: 46.2 m[IU]/mL

## 2020-01-22 NOTE — Research (Addendum)
Research RN contacted patient regarding her interest in participating in the COMET clinical trial. She is interested in participation and wishes to come in for consent visit today around 4:30 pm. Patient was told to come in the cancer center and ask for Ivin Booty in research, no appointment charges for this visit today and she doesn't need to register.  Patient into the cancer center alone for research consent visit to review the COMET protocol study. The patient states she has reviewed the protocol at home, with Dr. Tasia Catchings and Yolande Jolly, RN here in the research department. The protocols for COMET version 5.0 dated 04/22/2019 and DCP-001 version dated 06/04/2019 with [place] active date of 12/17/19 were reviewed with the patient in their entirety today, with specific explanation of the protocol treatment alternatives, potential risks, side effects, randomization, and potential benefits. The patients questions were answered to her reported satisfaction. The patient was informed that her participation is voluntary, she will not be paid and she can withdraw her consent at anytime during her care without any repercussions or change in the current quality of care she is receiving. The patient signed the consent / hipaa for Comet but refused to participate in the DCP-001 protocol The patient was given a copy of the signed ICF, the clinical trials brochure and research RN business card for any questions she may have or need in the future. The second eligibility review was performed by Yolande Jolly, RN. Research RN at [facility] requested to review one more time prior to beginning registration procedures to ensure eligibility for participation. Patient is understanding of all procedures related to her participation and is in agreement with them. She voiced that she is willing to accept whichever arm she is assigned without question for the study, surgery or active monitoring. Patient was specifically  reassured that in the event there was a change in her DCIS status in the future, being a part of the protocol will not prevent her from obtaining care required to treat her on the study. Research RN will notify the patient after registration, hopefully this afternoon, of which arm she receives.    Jeral Fruit, RN, BSN, OCN 01/22/2020 1645 pm

## 2020-01-23 ENCOUNTER — Other Ambulatory Visit: Payer: Self-pay

## 2020-01-23 ENCOUNTER — Ambulatory Visit (INDEPENDENT_AMBULATORY_CARE_PROVIDER_SITE_OTHER): Payer: BC Managed Care – PPO | Admitting: Internal Medicine

## 2020-01-23 DIAGNOSIS — D649 Anemia, unspecified: Secondary | ICD-10-CM

## 2020-01-23 DIAGNOSIS — Z800 Family history of malignant neoplasm of digestive organs: Secondary | ICD-10-CM

## 2020-01-23 DIAGNOSIS — F439 Reaction to severe stress, unspecified: Secondary | ICD-10-CM

## 2020-01-23 DIAGNOSIS — D0512 Intraductal carcinoma in situ of left breast: Secondary | ICD-10-CM

## 2020-01-23 DIAGNOSIS — K219 Gastro-esophageal reflux disease without esophagitis: Secondary | ICD-10-CM

## 2020-01-23 NOTE — Progress Notes (Signed)
Patient ID: Rose Bernard, female   DOB: 05/15/1966, 54 y.o.   MRN: [id number]   Virtual Visit via video Note  This visit type was conducted due to national recommendations for restrictions regarding the COVID-19 pandemic (e.g. social distancing).  This format is felt to be most appropriate for this patient at this time.  All issues noted in this document were discussed and addressed.  No physical exam was performed (except for noted visual exam findings with Video Visits).   I connected with Rose Bernard by a video enabled telemedicine application and verified that I am speaking with the correct person using two identifiers. Location patient: home Location provider: work Persons participating in the virtual visit: patient, provider  The limitations, risks, security and privacy concerns of performing an evaluation and management service by video and the availability of in person appointments have been discussed.  The patient expressed understanding and agreed to proceed.   Reason for visit: scheduled follow up.   HPI: She reports she is doing relatively well.  Increased stress.  Recently diagnosed with DCIS - recent breast biospy.  Seeing Dr Peyton Najjar.  Has seen oncology. Apparently planning to enter a study for further treatment - breast.  Incision healing well.  Tries to stay active.  No chest pain.  No sob.  nexium helps with acid reflux.  No abdominal pain or bowel change reported.  Overall appears to be handling stress relatively well.     ROS: See pertinent positives and negatives per HPI.  Past Medical History:  Diagnosis Date  . Chronic cystitis   . Frequent UTI   . GERD (gastroesophageal reflux disease)   . History of kidney stones   . Incomplete bladder emptying   . 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 ABDOMINAL HYSTERECTOMY  1995   2/2 delivery complications with 3rd child. Hysterectomy and bladder  repair with subsequent appendectomy. L Ovary remain in place   . APPENDECTOMY     2001 due to scar tissue  . BLADDER BOTOX  2014, 2015, 2016  . BREAST BIOPSY Bilateral 1991  . BREAST BIOPSY Left 12/12/2019   [place] bx, path pending  . BREAST SURGERY Bilateral 2002   benign mass removed  . COLONOSCOPY  2014  . EXCISION OF BREAST BIOPSY Left 01/10/2020   Procedure: EXCISION OF BREAST BIOPSY w/ Needle localization;  Surgeon: Herbert Pun, MD;  Location: [facility] ORS;  Service: General;  Laterality: Left;  . FOOT SURGERY Bilateral   . LAPAROSCOPIC HYSTERECTOMY  1995  . reconstruction of bladder   1995    2/2 delivery complications with 3rd child     Family History  Problem Relation Age of Onset  . Colon cancer Father        With ostomy in place   . Breast cancer Paternal Aunt 42  . Breast cancer Maternal Aunt   . Breast cancer Maternal Aunt     SOCIAL HX: reviewed.     Current Outpatient Medications:  .  esomeprazole (NEXIUM) 20 MG capsule, Take 20 mg by mouth daily at 12 noon., Disp: , Rfl:  .  LORazepam (ATIVAN) 1 MG tablet, Take 1 tablet (1 mg total) by mouth every 8 (eight) hours. (Patient not taking: Reported on 12/25/2019), Disp: 1 tablet, Rfl: 0  EXAM:  GENERAL: alert, oriented, appears well and in no acute distress  HEENT: atraumatic, conjunttiva clear, no obvious abnormalities on inspection of external nose and ears  NECK: normal movements of the head and neck  LUNGS: on inspection no signs of respiratory distress, breathing rate appears normal, no obvious gross SOB, gasping or wheezing  CV: no obvious cyanosis  PSYCH/NEURO: pleasant and cooperative, no obvious depression or anxiety, speech and thought processing grossly intact  ASSESSMENT AND PLAN:  Discussed the following assessment and plan:  Anemia Follow cbc.   Family history of colon cancer Colonoscopy 08/02/18 - diverticulum and internal hemorrhoids.    GERD (gastroesophageal reflux disease) nexium  helping.  Follow.    Stress Overall appears to be handling things relatively well.  Follow.    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  Being followed by Dr Peyton Najjar.  Incision site looks good.  Has f/u planned with oncology.      I discussed the assessment and treatment plan with the patient. The patient was provided an opportunity to ask questions and all were answered. The patient agreed with the plan and demonstrated an understanding of the instructions.   The patient was advised to call back or seek an in-person evaluation if the symptoms worsen or if the condition fails to improve as anticipated.   Einar Pheasant, MD

## 2020-01-24 LAB — SURGICAL PATHOLOGY

## 2020-01-26 ENCOUNTER — Encounter: Payer: Self-pay | Admitting: Internal Medicine

## 2020-01-26 DIAGNOSIS — D0510 Intraductal carcinoma in situ of unspecified breast: Secondary | ICD-10-CM | POA: Insufficient documentation

## 2020-01-26 NOTE — Assessment & Plan Note (Signed)
S/p excision.  Being followed by Dr Peyton Najjar.  Incision site looks good.  Has f/u planned with oncology.

## 2020-01-26 NOTE — Assessment & Plan Note (Signed)
Overall appears to be handling things relatively well.  Follow.   

## 2020-01-26 NOTE — Assessment & Plan Note (Signed)
Colonoscopy 08/02/18 - diverticulum and internal hemorrhoids.

## 2020-01-26 NOTE — Assessment & Plan Note (Signed)
nexium helping.  Follow.

## 2020-01-26 NOTE — Assessment & Plan Note (Signed)
Follow cbc.  

## 2020-01-27 ENCOUNTER — Institutional Professional Consult (permissible substitution): Payer: BC Managed Care – PPO | Admitting: Radiation Oncology

## 2020-01-27 DIAGNOSIS — D0512 Intraductal carcinoma in situ of left breast: Secondary | ICD-10-CM

## 2020-01-27 NOTE — Research (Signed)
COMET Notes: Patient called and left message requesting this RN get in touch with her today. RN returned her call at this time and the patient states she doesn't want to participate in the clinical trial, she wants to have surgery and radiation therapy only. Research RN informed the patient that she could still be a part of the clinical trial and be followed by the study if she wants surgery. Explained to the patient that the study allows a crossover of this nature if she is interested in continued participation. She states she doesn't know about this clinical trial " stuff " and just wants to not worry about it anymore. She states she still hasn't received her questionnaire packets from [employer] either.  Patient has an appointment to see Dr. Peyton Najjar tomorrow and is willing to come by the research office to sign a withdrawal of consent form for the protocol. Rose Bernard, [facility] contacted pathology and informed them of the patients decision and cancelled the tissue request for protocol requirements.   Jeral Fruit, RN, BSN, OCN 01/27/2020 1015 am

## 2020-01-28 ENCOUNTER — Ambulatory Visit: Payer: Self-pay | Admitting: General Surgery

## 2020-01-28 DIAGNOSIS — D0512 Intraductal carcinoma in situ of left breast: Secondary | ICD-10-CM

## 2020-01-28 NOTE — H&P (Signed)
HISTORY OF PRESENT ILLNESS:    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  Patient initially taken to the OR for excisional biopsy due to atypical ductal hyperplasia.  Excisional biopsy shows atypical ductal hyperplasia with extension of 21 mm and invasion of more than 2 ducts.  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  Patient was discussed in the tumor board discussion of reexcision versus COMET trial was offered to the patient.  Patient went with trial nurses at the cancer center.  After complete evaluation of both options the patient comes today to the office reporting that she will prefer to proceed with reexcision and standard therapy with postoperative radiation and antiestrogen therapy.  The patient endorses that she is feeling much better.  The pain has resolved.  The patient denies any significant swelling.  The patient denies any fever or chills.  The patient denies any skin changes.      PAST MEDICAL HISTORY:  Past Medical History:  Diagnosis Date  . Anemia 03/31/2018  . Overactive bladder         PAST SURGICAL HISTORY:   Past Surgical History:  Procedure Laterality Date  . APPENDECTOMY    . BLADDER SURGERY    . BREAST EXCISIONAL BIOPSY Left 01/10/2020   Dr Lesli Albee  . COLONOSCOPY  02/13/2013, 10/01/2007, 02/03/2003   [place] (Father): CBF 02/2018; Recall Ltr mailed 01/16/2018 (dh)  . COLONOSCOPY  08/02/2018   [place] (Father) CBF 07/2023  . EGD  10/01/2007   No repeat per RTE  . HYSTERECTOMY           MEDICATIONS:  Outpatient Encounter Medications as of 01/28/2020  Medication Sig Dispense Refill  . azithromycin (ZITHROMAX) 250 MG tablet For procedures      . chlorhexidine (PERIDEX) 0.12 % solution as needed For procedures      . esomeprazole (NEXIUM) 20 MG DR capsule Take by mouth    . naproxen (NAPROSYN) 250 MG tablet For procedure       No  facility-administered encounter medications on file as of 01/28/2020.      ALLERGIES:   Penicillins   SOCIAL HISTORY:  Social History   Socioeconomic History  . Marital status: Married    Spouse name: Not on file  . Number of children: Not on file  . Years of education: Not on file  . Highest education level: Not on file  Occupational History  . Not on file  Social Needs  . Financial resource strain: Not on file  . Food insecurity    Worry: Not on file    Inability: Not on file  . Transportation needs    Medical: Not on file    Non-medical: Not on file  Tobacco Use  . Smoking status: Never Smoker  . Smokeless tobacco: Never Used  Substance and Sexual Activity  . Alcohol use: No    Alcohol/week: 0.0 standard drinks  . Drug use: No  . Sexual activity: Yes    Birth control/protection: Post-menopausal, Surgical    Comment: hysterectomy  Other Topics Concern  . Not on file  Social History Narrative  . Not on file    FAMILY HISTORY:  Family History  Problem Relation Age of Onset  . Colon cancer Father   . Breast cancer Paternal Aunt      GENERAL REVIEW OF SYSTEMS:   General ROS: negative for - chills, fatigue, fever, weight gain  or weight loss Allergy and Immunology ROS: negative for - hives  Hematological and Lymphatic ROS: negative for - bleeding problems or bruising, negative for palpable nodes Endocrine ROS: negative for - heat or cold intolerance, hair changes Respiratory ROS: negative for - cough, shortness of breath or wheezing Cardiovascular ROS: no chest pain or palpitations GI ROS: negative for nausea, vomiting, abdominal pain, diarrhea, constipation Musculoskeletal ROS: negative for - joint swelling or muscle pain Neurological ROS: negative for - confusion, syncope Dermatological ROS: negative for pruritus and rash  PHYSICAL EXAM:  Vitals:   01/28/20 0913  BP: 136/80  Pulse: 77  .  Ht:167.6 cm (5\' 6" ) Wt:60.8 kg (134 lb) [id number] surface area is  1.68 meters squared. Body mass index is 21.63 kg/m.Marland Kitchen   GENERAL: Alert, active, oriented x3  HEENT: Pupils equal reactive to light. Extraocular movements are intact. Sclera clear. Palpebral conjunctiva normal red color.Pharynx clear.  NECK: Supple with no palpable mass and no adenopathy.  LUNGS: Sound clear with no rales rhonchi or wheezes.  HEART: Regular rhythm S1 and S2 without murmur.  BREAST: Left breast wound well healed. No skin changes, no hematoma, small seroma. No palpable masses.   ABDOMEN: Soft and depressible, nontender with no palpable mass, no hepatomegaly.   EXTREMITIES: Well-developed well-nourished symmetrical with no dependent edema.  NEUROLOGICAL: Awake alert oriented, facial expression symmetrical, moving all extremities.      IMPRESSION:     Ductal carcinoma in situ (DCIS) of left breast [D05.12] -S/p excisional biopsy of atypical ductal hyperplasia with upgrade to DCIS, low-grade. -Patient had discussion at the cancer center to choose between avoiding surgery and proceeding with observation versus radiation versus antiestrogen therapy or proceeding with standard therapy with reexcision, radiation therapy and antiestrogen therapy. -After a week of thinking about her options patient chose to proceed with standard therapy of reexcision with radiation therapy and antiestrogen therapy. -Today patient oriented about the surgical management.  We will proceed with excision.  Patient oriented about the risk of having persistent positive margins.  Patient return if she has a second positive margin, total mastectomy will be discussed. -Risk of surgery includes bleeding, infection, pain, seroma, hematoma, cosmetic issues.           PLAN:  1. Re-excision of left breast DCIS with re arrangement of tissue (Partial mastectomy 19301, 14001) 2. Avoid taking aspirin 5 days before surgery 3. Contact us if has any question or concern.   Patient and her daughter verbalized  understanding, all questions were answered, and were agreeable with the plan outlined above.   Herbert Pun, MD

## 2020-01-28 NOTE — Research (Addendum)
Withdrawal of Consent AFT-25 COMET Protocol:   Patient in to the cancer center with her sister to sign withdrawal of consent for her participation in the COMET protocol. IRB approved WCF reviewed with the patient prior to signature, patient states she wants to withdraw from all follow up at this time including specimens, QOL surveys and data. She does not want to participate any longer, she states she just wants to have her surgery and be done with it. Patient signed the [facility] from, witnessed by this RN. Patient provided a copy of her signed WCF form for her records. The patient was apologetic to this nurse about her withdrawal. RN reassured the patient and reminded her that it's her right to not participate if she does not want to, she needs to do what is comfortable and right for her care. Encouraged the patient to call if she has any further questions concerning the clinical trial in the future.   Jeral Fruit, RN, BSN, OCN 01/28/2020 1015 am

## 2020-01-28 NOTE — H&P (View-Only) (Signed)
HISTORY OF PRESENT ILLNESS:    Ms. 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  Patient initially taken to the OR for excisional biopsy due to atypical ductal hyperplasia.  Excisional biopsy shows atypical ductal hyperplasia with extension of 21 mm and invasion of more than 2 ducts.  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  Patient was discussed in the tumor board discussion of reexcision versus COMET trial was offered to the patient.  Patient went with trial nurses at the cancer center.  After complete evaluation of both options the patient comes today to the office reporting that she will prefer to proceed with reexcision and standard therapy with postoperative radiation and antiestrogen therapy.  The patient endorses that she is feeling much better.  The pain has resolved.  The patient denies any significant swelling.  The patient denies any fever or chills.  The patient denies any skin changes.      PAST MEDICAL HISTORY:  Past Medical History:  Diagnosis Date  . Anemia 03/31/2018  . Overactive bladder         PAST SURGICAL HISTORY:   Past Surgical History:  Procedure Laterality Date  . APPENDECTOMY    . BLADDER SURGERY    . BREAST EXCISIONAL BIOPSY Left 01/10/2020   Dr Lesli Albee  . COLONOSCOPY  02/13/2013, 10/01/2007, 02/03/2003   [place] (Father): CBF 02/2018; Recall Ltr mailed 01/16/2018 (dh)  . COLONOSCOPY  08/02/2018   [place] (Father) CBF 07/2023  . EGD  10/01/2007   No repeat per RTE  . HYSTERECTOMY           MEDICATIONS:  Outpatient Encounter Medications as of 01/28/2020  Medication Sig Dispense Refill  . azithromycin (ZITHROMAX) 250 MG tablet For procedures      . chlorhexidine (PERIDEX) 0.12 % solution as needed For procedures      . esomeprazole (NEXIUM) 20 MG DR capsule Take by mouth    . naproxen (NAPROSYN) 250 MG tablet For procedure       No  facility-administered encounter medications on file as of 01/28/2020.      ALLERGIES:   Penicillins   SOCIAL HISTORY:  Social History   Socioeconomic History  . Marital status: Married    Spouse name: Not on file  . Number of children: Not on file  . Years of education: Not on file  . Highest education level: Not on file  Occupational History  . Not on file  Social Needs  . Financial resource strain: Not on file  . Food insecurity    Worry: Not on file    Inability: Not on file  . Transportation needs    Medical: Not on file    Non-medical: Not on file  Tobacco Use  . Smoking status: Never Smoker  . Smokeless tobacco: Never Used  Substance and Sexual Activity  . Alcohol use: No    Alcohol/week: 0.0 standard drinks  . Drug use: No  . Sexual activity: Yes    Birth control/protection: Post-menopausal, Surgical    Comment: hysterectomy  Other Topics Concern  . Not on file  Social History Narrative  . Not on file    FAMILY HISTORY:  Family History  Problem Relation Age of Onset  . Colon cancer Father   . Breast cancer Paternal Aunt      GENERAL REVIEW OF SYSTEMS:   General ROS: negative for - chills, fatigue, fever, weight gain  or weight loss Allergy and Immunology ROS: negative for - hives  Hematological and Lymphatic ROS: negative for - bleeding problems or bruising, negative for palpable nodes Endocrine ROS: negative for - heat or cold intolerance, hair changes Respiratory ROS: negative for - cough, shortness of breath or wheezing Cardiovascular ROS: no chest pain or palpitations GI ROS: negative for nausea, vomiting, abdominal pain, diarrhea, constipation Musculoskeletal ROS: negative for - joint swelling or muscle pain Neurological ROS: negative for - confusion, syncope Dermatological ROS: negative for pruritus and rash  PHYSICAL EXAM:  Vitals:   01/28/20 0913  BP: 136/80  Pulse: 77  .  Ht:167.6 cm (5\' 6" ) Wt:60.8 kg (134 lb) [id number] surface area is  1.68 meters squared. Body mass index is 21.63 kg/m.Marland Kitchen   GENERAL: Alert, active, oriented x3  HEENT: Pupils equal reactive to light. Extraocular movements are intact. Sclera clear. Palpebral conjunctiva normal red color.Pharynx clear.  NECK: Supple with no palpable mass and no adenopathy.  LUNGS: Sound clear with no rales rhonchi or wheezes.  HEART: Regular rhythm S1 and S2 without murmur.  BREAST: Left breast wound well healed. No skin changes, no hematoma, small seroma. No palpable masses.   ABDOMEN: Soft and depressible, nontender with no palpable mass, no hepatomegaly.   EXTREMITIES: Well-developed well-nourished symmetrical with no dependent edema.  NEUROLOGICAL: Awake alert oriented, facial expression symmetrical, moving all extremities.      IMPRESSION:     Ductal carcinoma in situ (DCIS) of left breast [D05.12] -S/p excisional biopsy of atypical ductal hyperplasia with upgrade to DCIS, low-grade. -Patient had discussion at the cancer center to choose between avoiding surgery and proceeding with observation versus radiation versus antiestrogen therapy or proceeding with standard therapy with reexcision, radiation therapy and antiestrogen therapy. -After a week of thinking about her options patient chose to proceed with standard therapy of reexcision with radiation therapy and antiestrogen therapy. -Today patient oriented about the surgical management.  We will proceed with excision.  Patient oriented about the risk of having persistent positive margins.  Patient return if she has a second positive margin, total mastectomy will be discussed. -Risk of surgery includes bleeding, infection, pain, seroma, hematoma, cosmetic issues.           PLAN:  1. Re-excision of left breast DCIS with re arrangement of tissue (Partial mastectomy 19301, 14001) 2. Avoid taking aspirin 5 days before surgery 3. Contact us if has any question or concern.   Patient and her daughter verbalized  understanding, all questions were answered, and were agreeable with the plan outlined above.   Herbert Pun, MD

## 2020-01-30 ENCOUNTER — Telehealth: Payer: Self-pay

## 2020-01-30 MED ORDER — CLINDAMYCIN PHOSPHATE 900 MG/50ML IV SOLN
900.0000 mg | INTRAVENOUS | Status: AC
  Administered 2020-01-31: 900 mg via INTRAVENOUS

## 2020-01-30 NOTE — Telephone Encounter (Signed)
Per message from Dr. Tasia Catchings:  She will proceed with re-excision and will do Radiation afterwards and I plan to see her after she finishes Radiation for anti estrogen treatments.  Please refer her to establish care with Dr.Chrystal in 2 weeks, and follow up with me lab-cbc cmp 1 week after she finishes radiation.

## 2020-01-31 ENCOUNTER — Ambulatory Visit: Payer: BC Managed Care – PPO | Admitting: Anesthesiology

## 2020-01-31 ENCOUNTER — Encounter
Admission: RE | Disposition: A | Discharge status: Home / Self Care | Payer: Self-pay | Source: Home / Self Care | Attending: General Surgery

## 2020-01-31 ENCOUNTER — Other Ambulatory Visit: Payer: Self-pay

## 2020-01-31 ENCOUNTER — Encounter: Payer: Self-pay | Admitting: General Surgery

## 2020-01-31 ENCOUNTER — Ambulatory Visit
Admission: RE | Admit: 2020-01-31 | Discharge: 2020-01-31 | Disposition: A | Discharge status: Home / Self Care | Payer: BC Managed Care – PPO | Attending: General Surgery | Admitting: General Surgery

## 2020-01-31 DIAGNOSIS — K219 Gastro-esophageal reflux disease without esophagitis: Secondary | ICD-10-CM | POA: Insufficient documentation

## 2020-01-31 DIAGNOSIS — D649 Anemia, unspecified: Secondary | ICD-10-CM | POA: Insufficient documentation

## 2020-01-31 DIAGNOSIS — D0512 Intraductal carcinoma in situ of left breast: Secondary | ICD-10-CM | POA: Insufficient documentation

## 2020-01-31 DIAGNOSIS — Z79899 Other long term (current) drug therapy: Secondary | ICD-10-CM | POA: Insufficient documentation

## 2020-01-31 HISTORY — PX: MASTECTOMY, PARTIAL: SHX709

## 2020-01-31 SURGERY — MASTECTOMY PARTIAL
Anesthesia: General | Site: Breast | Laterality: Left

## 2020-01-31 MED ORDER — FAMOTIDINE 20 MG PO TABS: 20.0000 mg | ORAL_TABLET | Freq: Once | ORAL | Status: AC

## 2020-01-31 MED ORDER — CLINDAMYCIN PHOSPHATE 900 MG/50ML IV SOLN
INTRAVENOUS | Status: AC
  Filled 2020-01-31: qty 50

## 2020-01-31 MED ORDER — EPHEDRINE SULFATE 50 MG/ML IJ SOLN
INTRAMUSCULAR | Status: DC | PRN
  Administered 2020-01-31 (×2): 5 mg via INTRAVENOUS

## 2020-01-31 MED ORDER — HYDROCODONE-ACETAMINOPHEN 5-325 MG PO TABS: 1.0000 | ORAL_TABLET | ORAL | 0 refills | Status: DC | PRN

## 2020-01-31 MED ORDER — LACTATED RINGERS IV SOLN: INTRAVENOUS | Status: DC | PRN

## 2020-01-31 MED ORDER — HYDROMORPHONE HCL 1 MG/ML IJ SOLN
INTRAMUSCULAR | Status: AC
  Filled 2020-01-31: qty 1

## 2020-01-31 MED ORDER — PROPOFOL 10 MG/ML IV BOLUS
INTRAVENOUS | Status: DC | PRN
  Administered 2020-01-31: 120 mg via INTRAVENOUS

## 2020-01-31 MED ORDER — FENTANYL CITRATE (PF) 100 MCG/2ML IJ SOLN
INTRAMUSCULAR | Status: AC
  Filled 2020-01-31: qty 2

## 2020-01-31 MED ORDER — OXYCODONE HCL 5 MG/5ML PO SOLN: 5.0000 mg | Freq: Once | ORAL | Status: DC | PRN

## 2020-01-31 MED ORDER — MIDAZOLAM HCL 2 MG/2ML IJ SOLN
INTRAMUSCULAR | Status: AC
  Filled 2020-01-31: qty 2

## 2020-01-31 MED ORDER — PROMETHAZINE HCL 25 MG/ML IJ SOLN: 6.2500 mg | INTRAMUSCULAR | Status: DC | PRN

## 2020-01-31 MED ORDER — BUPIVACAINE-EPINEPHRINE (PF) 0.5% -1:200000 IJ SOLN
INTRAMUSCULAR | Status: AC
  Filled 2020-01-31: qty 30

## 2020-01-31 MED ORDER — HYDROMORPHONE HCL 1 MG/ML IJ SOLN
INTRAMUSCULAR | Status: DC | PRN
  Administered 2020-01-31: .6 mg via INTRAVENOUS

## 2020-01-31 MED ORDER — FENTANYL CITRATE (PF) 100 MCG/2ML IJ SOLN
25.0000 ug | INTRAMUSCULAR | Status: DC | PRN
  Administered 2020-01-31 (×3): 25 ug via INTRAVENOUS

## 2020-01-31 MED ORDER — SUCCINYLCHOLINE CHLORIDE 20 MG/ML IJ SOLN
INTRAMUSCULAR | Status: AC
  Filled 2020-01-31: qty 1

## 2020-01-31 MED ORDER — LIDOCAINE HCL (CARDIAC) PF 100 MG/5ML IV SOSY
PREFILLED_SYRINGE | INTRAVENOUS | Status: DC | PRN
  Administered 2020-01-31: 60 mg via INTRAVENOUS

## 2020-01-31 MED ORDER — FENTANYL CITRATE (PF) 100 MCG/2ML IJ SOLN
INTRAMUSCULAR | Status: AC
  Administered 2020-01-31: 25 ug via INTRAVENOUS
  Filled 2020-01-31: qty 2

## 2020-01-31 MED ORDER — TRAMADOL HCL 50 MG PO TABS: 50.0000 mg | ORAL_TABLET | Freq: Four times a day (QID) | ORAL | 0 refills | Status: DC | PRN

## 2020-01-31 MED ORDER — LACTATED RINGERS IV SOLN: INTRAVENOUS | Status: DC

## 2020-01-31 MED ORDER — FAMOTIDINE 20 MG PO TABS
ORAL_TABLET | ORAL | Status: AC
  Administered 2020-01-31: 09:00:00 20 mg via ORAL
  Filled 2020-01-31: qty 1

## 2020-01-31 MED ORDER — FENTANYL CITRATE (PF) 100 MCG/2ML IJ SOLN
INTRAMUSCULAR | Status: DC | PRN
  Administered 2020-01-31: 50 ug via INTRAVENOUS

## 2020-01-31 MED ORDER — TRAMADOL HCL 50 MG PO TABS
50.0000 mg | ORAL_TABLET | Freq: Once | ORAL | Status: AC
  Filled 2020-01-31: qty 1

## 2020-01-31 MED ORDER — TRAMADOL HCL 50 MG PO TABS
ORAL_TABLET | ORAL | Status: AC
  Administered 2020-01-31: 13:00:00 50 mg via ORAL
  Filled 2020-01-31: qty 1

## 2020-01-31 MED ORDER — BUPIVACAINE-EPINEPHRINE (PF) 0.5% -1:200000 IJ SOLN
INTRAMUSCULAR | Status: DC | PRN
  Administered 2020-01-31: 30 mL via PERINEURAL

## 2020-01-31 MED ORDER — DEXAMETHASONE SODIUM PHOSPHATE 10 MG/ML IJ SOLN
INTRAMUSCULAR | Status: DC | PRN
  Administered 2020-01-31: 5 mg via INTRAVENOUS

## 2020-01-31 MED ORDER — OXYCODONE HCL 5 MG PO TABS: 5.0000 mg | ORAL_TABLET | Freq: Once | ORAL | Status: DC | PRN

## 2020-01-31 MED ORDER — LIDOCAINE HCL (PF) 2 % IJ SOLN
INTRAMUSCULAR | Status: AC
  Filled 2020-01-31: qty 10

## 2020-01-31 MED ORDER — ROCURONIUM BROMIDE 50 MG/5ML IV SOLN
INTRAVENOUS | Status: AC
  Filled 2020-01-31: qty 1

## 2020-01-31 MED ORDER — MEPERIDINE HCL 50 MG/ML IJ SOLN: 6.2500 mg | INTRAMUSCULAR | Status: DC | PRN

## 2020-01-31 MED ORDER — ACETAMINOPHEN 10 MG/ML IV SOLN
INTRAVENOUS | Status: DC | PRN
  Administered 2020-01-31: 1000 mg via INTRAVENOUS

## 2020-01-31 MED ORDER — ONDANSETRON HCL 4 MG/2ML IJ SOLN
INTRAMUSCULAR | Status: DC | PRN
  Administered 2020-01-31: 4 mg via INTRAVENOUS

## 2020-01-31 MED ORDER — PROPOFOL 10 MG/ML IV BOLUS
INTRAVENOUS | Status: AC
  Filled 2020-01-31: qty 20

## 2020-01-31 MED ORDER — PHENYLEPHRINE HCL (PRESSORS) 10 MG/ML IV SOLN
INTRAVENOUS | Status: DC | PRN
  Administered 2020-01-31 (×5): 100 ug via INTRAVENOUS

## 2020-01-31 MED ORDER — MIDAZOLAM HCL 2 MG/2ML IJ SOLN
INTRAMUSCULAR | Status: DC | PRN
  Administered 2020-01-31: 2 mg via INTRAVENOUS

## 2020-01-31 MED ORDER — ACETAMINOPHEN 10 MG/ML IV SOLN
INTRAVENOUS | Status: AC
  Filled 2020-01-31: qty 100

## 2020-01-31 SURGICAL SUPPLY — 43 items
BLADE SURG 15 STRL LF DISP TIS (BLADE) ×1 IMPLANT
BLADE SURG 15 STRL SS (BLADE) ×2
CANISTER SUCT 1200ML W/VALVE (MISCELLANEOUS) ×3 IMPLANT
CHLORAPREP W/TINT 26 (MISCELLANEOUS) ×3 IMPLANT
CNTNR SPEC 2.5X3XGRAD LEK (MISCELLANEOUS) ×1
CONT SPEC 4OZ STER OR WHT (MISCELLANEOUS) ×2
CONTAINER SPEC 2.5X3XGRAD LEK (MISCELLANEOUS) ×1 IMPLANT
COVER WAND RF STERILE (DRAPES) ×3 IMPLANT
DERMABOND ADVANCED (GAUZE/BANDAGES/DRESSINGS) ×2
DERMABOND ADVANCED .7 DNX12 (GAUZE/BANDAGES/DRESSINGS) ×1 IMPLANT
DEVICE DUBIN SPECIMEN MAMMOGRA (MISCELLANEOUS) ×3 IMPLANT
DRAPE LAPAROTOMY TRNSV 106X77 (MISCELLANEOUS) ×3 IMPLANT
ELECT CAUTERY BLADE TIP 2.5 (TIP) ×3
ELECT REM PT RETURN 9FT ADLT (ELECTROSURGICAL) ×3
ELECTRODE CAUTERY BLDE TIP 2.5 (TIP) ×1 IMPLANT
ELECTRODE REM PT RTRN 9FT ADLT (ELECTROSURGICAL) ×1 IMPLANT
GLOVE BIO SURGEON STRL SZ 6.5 (GLOVE) ×2 IMPLANT
GLOVE BIO SURGEONS STRL SZ 6.5 (GLOVE) ×1
GLOVE BIOGEL PI IND STRL 6.5 (GLOVE) ×1 IMPLANT
GLOVE BIOGEL PI INDICATOR 6.5 (GLOVE) ×2
GOWN STRL REUS W/ TWL LRG LVL3 (GOWN DISPOSABLE) ×2 IMPLANT
GOWN STRL REUS W/TWL LRG LVL3 (GOWN DISPOSABLE) ×4
KIT MARKER MARGIN INK (KITS) IMPLANT
KIT TURNOVER KIT A (KITS) ×3 IMPLANT
LABEL OR SOLS (LABEL) ×3 IMPLANT
MARGIN MAP 10MM (MISCELLANEOUS) ×3 IMPLANT
MARKER MARGIN CORRECT CLIP (MARKER) IMPLANT
NEEDLE HYPO 25X1 1.5 SAFETY (NEEDLE) ×3 IMPLANT
PACK BASIN MINOR ARMC (MISCELLANEOUS) ×3 IMPLANT
RETRACTOR RING XSMALL (MISCELLANEOUS) IMPLANT
RTRCTR WOUND ALEXIS 13CM XS SH (MISCELLANEOUS)
SUT ETHILON 3-0 FS-10 30 BLK (SUTURE) ×3
SUT MNCRL 4-0 (SUTURE) ×2
SUT MNCRL 4-0 27XMFL (SUTURE) ×1
SUT PROLENE 6 0 CC (SUTURE) ×3 IMPLANT
SUT SILK 2 0 SH (SUTURE) ×3 IMPLANT
SUT VIC AB 3-0 SH 27 (SUTURE) ×4
SUT VIC AB 3-0 SH 27X BRD (SUTURE) ×2 IMPLANT
SUTURE EHLN 3-0 FS-10 30 BLK (SUTURE) ×1 IMPLANT
SUTURE MNCRL 4-0 27XMF (SUTURE) ×1 IMPLANT
SYR 10ML LL (SYRINGE) ×3 IMPLANT
SYR BULB IRRIG 60ML STRL (SYRINGE) ×3 IMPLANT
WATER STERILE IRR 1000ML POUR (IV SOLUTION) ×3 IMPLANT

## 2020-01-31 NOTE — Op Note (Signed)
Preoperative diagnosis: Left breast ductal carcinoma in situ.  Postoperative diagnosis: Left breast ductal carcinoma in situ.   Procedure: Left partial mastectomy.                       Anesthesia: GETA  Surgeon: Dr. Windell Moment  Wound Classification: Clean  Indications: Patient is a 53 y.o. female with a nonpalpable left breast distortion noted on mammography with core biopsy demonstrating atypical hyperplasia.  Patient had excisional biopsy.  Excisional biopsy showed ductal carcinoma in situ with positive anterior and inferior margins.  Patient had a partial mastectomy for the reexcision of the anterior and inferior margin.  Findings: 1.  No abdominal palpable mass or node identified.  Viable skin flaps. 2.  Adequate hemostasis.  Description of procedure:  The patient was taken to the operating room and placed supine on the operating table, and after general anesthesia the left chest was prepped and draped in the usual sterile fashion. A time-out was completed verifying correct patient, procedure, site, positioning, and implant(s) and/or special equipment prior to beginning this procedure.  A circumareolar skin incision was made over the previous scar.  That entailed the anterior margin.  This was removed completely.  Then another incision was done on the inferior edge of the wound and dissection was carried down to the pectoralis muscle.  This way the inferior margin was resected.   The anterior margin was oriented with the lateral and caudal margin.  The skin was left on the specimen.  The inferior margin was oriented with the medial and skin margin abscess.  Skin was also left on the specimen.  A blue stitch was placed on the inside cavity area. The breast wound was then approached for closure.  Eliminate dead due to the small breast to decrease the size of the cavity, a tissue transfer technique was utilized.  The breast and pectoralis fascia was elevated off the underlying muscle and the  serratus muscle circumferentially for a distance of about 6-7 centimeters.  The fascial layer was then approximated with interrupted 2-0 Vicryl sutures.  The superficial layer of the breast parenchyma was then approximated in a similar fashion.  This was done in a radial direction.  The skin flaps were then elevated circumferentially to remove a ripple noted superiorly and medially. The skin was closed with 4-0 Monocryl. Dermabond was applied. The patient tolerated the procedure well and was taken to the postanesthesia care unit in stable condition.   Specimen: 1.  Left breast anterior margin                     2.  Left breast inferior margin  Complications: None  Estimated Blood Loss: 5 mL

## 2020-01-31 NOTE — Interval H&P Note (Signed)
History and Physical Interval Note:  01/31/2020 10:04 AM  Rose Bernard  has presented today for surgery, with the diagnosis of D05.12 DCIS of lt breast.  The various methods of treatment have been discussed with the patient and family. After consideration of risks, benefits and other options for treatment, the patient has consented to  Procedure(s): MASTECTOMY PARTIAL (Left) as a surgical intervention.  The patient's history has been reviewed, patient examined, no change in status, stable for surgery.  I have reviewed the patient's chart and labs.  Left chest marked in the pre procedure area. Questions were answered to the patient's satisfaction.     Herbert Pun

## 2020-01-31 NOTE — Discharge Instructions (Signed)
  Diet: Resume home heart healthy regular diet.   Activity: No heavy lifting >20 pounds (children, pets, laundry, garbage) or strenuous activity until follow-up, but light activity and walking are encouraged. Do not drive or drink alcohol if taking narcotic pain medications.  Wound care: May shower with soapy water and pat dry (do not rub incisions), but no baths or submerging incision underwater until follow-up. (no swimming)   Medications: Resume all home medications. For mild to moderate pain: acetaminophen (Tylenol) or ibuprofen (if no kidney disease). Combining Tylenol with alcohol can substantially increase your risk of causing liver disease. Narcotic pain medications, if prescribed, can be used for severe pain, though may cause nausea, constipation, and drowsiness. Do not combine Tylenol and Norco within a 6 hour period as Norco contains Tylenol. If you do not need the narcotic pain medication, you do not need to fill the prescription.  Call office ([phone]) at any time if any questions, worsening pain, fevers/chills, bleeding, drainage from incision site, or other concerns.   AMBULATORY SURGERY  DISCHARGE INSTRUCTIONS   1) The drugs that you were given will stay in your system until tomorrow so for the next 24 hours you should not:  A) Drive an automobile B) Make any legal decisions C) Drink any alcoholic beverage   2) You may resume regular meals tomorrow.  Today it is better to start with liquids and gradually work up to solid foods.  You may eat anything you prefer, but it is better to start with liquids, then soup and crackers, and gradually work up to solid foods.   3) Please notify your doctor immediately if you have any unusual bleeding, trouble breathing, redness and pain at the surgery site, drainage, fever, or pain not relieved by medication.    4) Additional Instructions:        Please contact your physician with any problems or Same Day Surgery at  [phone], Monday through Friday 6 am to 4 pm, or [place] [facility] number at [phone]. 

## 2020-01-31 NOTE — Anesthesia Preprocedure Evaluation (Signed)
Anesthesia Evaluation  Patient identified by MRN, date of birth, ID band Patient awake    Reviewed: Allergy & Precautions, NPO status , Patient's Chart, lab work & pertinent test results  History of Anesthesia Complications Negative for: history of anesthetic complications  Airway Mallampati: II  TM Distance: >3 FB Neck ROM: Full    Dental  (+) Missing, Poor Dentition   Pulmonary neg pulmonary ROS, neg sleep apnea, neg COPD,    breath sounds clear to auscultation- rhonchi (-) wheezing      Cardiovascular Exercise Tolerance: Good (-) hypertension(-) CAD, (-) Past MI, (-) Cardiac Stents and (-) CABG  Rhythm:Regular Rate:Normal - Systolic murmurs and - Diastolic murmurs    Neuro/Psych  Headaches, neg Seizures negative psych ROS   GI/Hepatic Neg liver ROS, GERD  ,  Endo/Other  negative endocrine ROSneg diabetes  Renal/GU Renal disease: hx of nephrolithiasis.     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   Reproductive/Obstetrics                             Anesthesia Physical Anesthesia Plan  ASA: II  Anesthesia Plan: General   Post-op Pain Management:    Induction: Intravenous  PONV Risk Score and Plan: 2 and Ondansetron, Dexamethasone and Midazolam  Airway Management Planned: LMA  Additional Equipment:   Intra-op Plan:   Post-operative Plan:   Informed Consent: I have reviewed the patients History and Physical, chart, labs and discussed the procedure including the risks, benefits and alternatives for the proposed anesthesia with the  patient or authorized representative who has indicated his/her understanding and acceptance.     Dental advisory given  Plan Discussed with: CRNA and Anesthesiologist  Anesthesia Plan Comments:         Anesthesia Quick Evaluation

## 2020-01-31 NOTE — Transfer of Care (Signed)
Immediate Anesthesia Transfer of Care Note  Patient: Rose Bernard  Procedure(s) Performed: MASTECTOMY PARTIAL (Left Breast)  Patient Location: PACU  Anesthesia Type:General  Level of Consciousness: sedated  Airway & Oxygen Therapy: Patient Spontanous Breathing and Patient connected to face mask oxygen  Post-op Assessment: Report given to RN and Post -op Vital signs reviewed and stable  Post vital signs: Reviewed and stable  Last Vitals:  Vitals Value Taken Time  BP 91/48 01/31/20 1151  Temp    Pulse 72 01/31/20 1156  Resp 14 01/31/20 1156  SpO2 97 % 01/31/20 1156  Vitals shown include unvalidated device data.  Last Pain:  Vitals:   01/31/20 0917  TempSrc: Tympanic  PainSc: 0-No pain      Patients Stated Pain Goal: 0 ([id number])  Complications: No apparent anesthesia complications

## 2020-01-31 NOTE — Anesthesia Procedure Notes (Signed)
Procedure Name: LMA Insertion Date/Time: 01/31/2020 10:37 AM Performed by: Justus Memory, CRNA Pre-anesthesia Checklist: Patient identified, Patient being monitored, Timeout performed, Emergency Drugs available and Suction available Patient Re-evaluated:Patient Re-evaluated prior to induction Oxygen Delivery Method: Circle system utilized Preoxygenation: Pre-oxygenation with 100% oxygen Induction Type: IV induction Ventilation: Mask ventilation without difficulty LMA: LMA inserted LMA Size: 3.5 Tube type: Oral Number of attempts: 1 Placement Confirmation: positive ETCO2 and breath sounds checked- equal and bilateral Tube secured with: Tape Dental Injury: Teeth and Oropharynx as per pre-operative assessment

## 2020-01-31 NOTE — Anesthesia Postprocedure Evaluation (Signed)
Anesthesia Post Note  Patient: Rose Bernard  Procedure(s) Performed: MASTECTOMY PARTIAL (Left Breast)  Patient location during evaluation: PACU Anesthesia Type: General Level of consciousness: awake and alert and oriented Pain management: pain level controlled Vital Signs Assessment: post-procedure vital signs reviewed and stable Respiratory status: spontaneous breathing, nonlabored ventilation and respiratory function stable Cardiovascular status: blood pressure returned to baseline and stable Postop Assessment: no signs of nausea or vomiting Anesthetic complications: no     Last Vitals:  Vitals:   01/31/20 1218 01/31/20 1220  BP: (!) 96/51 (!) 92/50  Pulse: 73 74  Resp: 15 14  Temp:    SpO2: 98% 97%    Last Pain:  Vitals:   01/31/20 1230  TempSrc:   PainSc: 6                  Damascus Feldpausch

## 2020-02-03 LAB — SURGICAL PATHOLOGY

## 2020-02-12 ENCOUNTER — Other Ambulatory Visit: Payer: Self-pay

## 2020-02-13 ENCOUNTER — Encounter: Payer: Self-pay | Admitting: Radiation Oncology

## 2020-02-13 ENCOUNTER — Ambulatory Visit
Admission: RE | Admit: 2020-02-13 | Discharge: 2020-02-13 | Disposition: A | Discharge status: Home / Self Care | Payer: BC Managed Care – PPO | Source: Ambulatory Visit | Attending: Radiation Oncology | Admitting: Radiation Oncology

## 2020-02-13 ENCOUNTER — Other Ambulatory Visit: Payer: Self-pay

## 2020-02-13 VITALS — BP 121/71 | HR 75 | Temp 96.3°F | Resp 16 | Wt 142.8 lb

## 2020-02-13 DIAGNOSIS — K219 Gastro-esophageal reflux disease without esophagitis: Secondary | ICD-10-CM | POA: Insufficient documentation

## 2020-02-13 DIAGNOSIS — Z87442 Personal history of urinary calculi: Secondary | ICD-10-CM | POA: Diagnosis not present

## 2020-02-13 DIAGNOSIS — N3020 Other chronic cystitis without hematuria: Secondary | ICD-10-CM | POA: Insufficient documentation

## 2020-02-13 DIAGNOSIS — Z803 Family history of malignant neoplasm of breast: Secondary | ICD-10-CM | POA: Diagnosis not present

## 2020-02-13 DIAGNOSIS — Z170 Estrogen receptor positive status [ER+]: Secondary | ICD-10-CM | POA: Insufficient documentation

## 2020-02-13 DIAGNOSIS — D0512 Intraductal carcinoma in situ of left breast: Secondary | ICD-10-CM | POA: Diagnosis not present

## 2020-02-13 NOTE — Consult Note (Signed)
NEW PATIENT EVALUATION  Name: Rose Bernard  MRN: [id number]  Date:   02/13/2020     DOB: November 06, 1966   This 54 y.o. 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  Tumor is ER positive  REFERRING PHYSICIAN: Einar Pheasant, MD  CHIEF COMPLAINT:  Chief Complaint  Patient presents with  . Breast Cancer    DIAGNOSIS: The encounter diagnosis was Ductal carcinoma in situ (DCIS) of left breast.   PREVIOUS INVESTIGATIONS:  Mammogram and ultrasound reviewed Clinical notes reviewed Pathology report reviewed  HPI: Patient is a 54 year old female whose father was a former patient of mine years ago presents with an abnormal mammogram the left breast.  There were calcifications in the lateral left breast with a suspicious mass present to those calcifications located superior which measures 5 mm.  Ultrasound confirmed lesion at the 4 o'clock position of the left breast.  No axillary adenopathy was identified.  Initial biopsy back in December 2020 showed atypical ductal hyperplasia.  She underwent excisional biopsy January 29 showing ductal carcinoma in situ nuclear grade 1-2.  Tumor extended at least 2.1 cm.  ER status was positive.  Margins were positive she underwent reexcision of the anterior and inferior margins showing a residual focus of 1.5 mm of ductal carcinoma in situ.  She tolerated her surgery well she is seen today for radiation oncology opinion.  She specifically denies breast tenderness cough or bone pain.  She has turndown enrollment in the [employer] study.  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    PAST  SURGICAL HISTORY:  Past Surgical History:  Procedure Laterality Date  . ABDOMINAL HYSTERECTOMY  1995   2/2 delivery complications with 3rd child. Hysterectomy and bladder repair with subsequent appendectomy. L Ovary remain in place   . APPENDECTOMY     2001 due to scar tissue  . BLADDER BOTOX  2014, 2015, 2016  . BREAST BIOPSY Bilateral 1991  . BREAST BIOPSY Left 12/12/2019   [place] bx, path pending  . BREAST SURGERY Bilateral 2002   benign mass removed  . COLONOSCOPY  2014  . EXCISION OF BREAST BIOPSY Left 01/10/2020   Procedure: EXCISION OF BREAST BIOPSY w/ Needle localization;  Surgeon: Herbert Pun, MD;  Location: [facility] ORS;  Service: General;  Laterality: Left;  . FOOT SURGERY Bilateral   . LAPAROSCOPIC HYSTERECTOMY  1995  . MASTECTOMY, PARTIAL Left 01/31/2020   Procedure: MASTECTOMY PARTIAL;  Surgeon: Herbert Pun, MD;  Location: [facility] ORS;  Service: General;  Laterality: Left;  . reconstruction of bladder   1995    2/2 delivery complications with 3rd child     FAMILY HISTORY: family history includes Breast cancer in her maternal aunt and maternal aunt; Breast cancer (age of onset: 75) in her paternal aunt; Colon cancer in her father.  SOCIAL HISTORY:  reports that she has never smoked. She has never used smokeless tobacco. She reports that she does not drink alcohol or use drugs.  ALLERGIES: Penicillin v potassium  MEDICATIONS:  Current Outpatient Medications  Medication Sig Dispense Refill  . esomeprazole (NEXIUM) 20 MG capsule Take 20 mg by mouth daily as needed (acid reflux).      No current facility-administered medications  for this encounter.    ECOG PERFORMANCE STATUS:  0 - Asymptomatic  REVIEW OF SYSTEMS: Patient denies any weight loss, fatigue, weakness, fever, chills or night sweats. Patient denies any loss of vision, blurred vision. Patient denies any ringing  of the ears or hearing loss. No irregular heartbeat. Patient denies heart murmur or  history of fainting. Patient denies any chest pain or pain radiating to her upper extremities. Patient denies any shortness of breath, difficulty breathing at night, cough or hemoptysis. Patient denies any swelling in the lower legs. Patient denies any nausea vomiting, vomiting of blood, or coffee ground material in the vomitus. Patient denies any stomach pain. Patient states has had normal bowel movements no significant constipation or diarrhea. Patient denies any dysuria, hematuria or significant nocturia. Patient denies any problems walking, swelling in the joints or loss of balance. Patient denies any skin changes, loss of hair or loss of weight. Patient denies any excessive worrying or anxiety or significant depression. Patient denies any problems with insomnia. Patient denies excessive thirst, polyuria, polydipsia. Patient denies any swollen glands, patient denies easy bruising or easy bleeding. Patient denies any recent infections, allergies or URI. Patient "s visual fields have not changed significantly in recent time.   PHYSICAL EXAM: BP 121/71   Pulse 75   Temp (!) 96.3 F (35.7 C)   Resp 16   Wt 142 lb 12.8 oz (64.8 kg)   LMP 02/07/1994   BMI 22.37 kg/m  She is status post wide local excision of the left breast incision is healing well.  No dominant mass or nodularity is noted in either breast in 2 positions examined.  No axillary or supraclavicular adenopathy is identified.  Well-developed well-nourished patient in NAD. HEENT reveals PERLA, EOMI, discs not visualized.  Oral cavity is clear. No oral mucosal lesions are identified. Neck is clear without evidence of cervical or supraclavicular adenopathy. Lungs are clear to A&P. Cardiac examination is essentially unremarkable with regular rate and rhythm without murmur rub or thrill. Abdomen is benign with no organomegaly or masses noted. Motor sensory and DTR levels are equal and symmetric in the upper and lower extremities. Cranial nerves II  through XII are grossly intact. Proprioception is intact. No peripheral adenopathy or edema is identified. No motor or sensory levels are noted. Crude visual fields are within normal range.  LABORATORY DATA: Pathology report reviewed    RADIOLOGY RESULTS: Mammogram and ultrasound reviewed   IMPRESSION: Stage 0 ductal carcinoma in situ ER positive of the left breast status post wide local excision and reexcision with clear margins in 54 year old female  PLAN: At this time I have recommended whole breast radiation to her left breast.  Would plan on a hypofractionated course of treatment over 3 weeks.  Based on her reexcision would boost her scar another 1400 cGy using electron beam.  Risks and benefits of treatment including skin reaction fatigue alteration of blood counts possible occlusion of superficial lung all were described in detail to the patient and her sister.  I have personally set up and ordered CT simulation for next week.  Patient comprehends my treatment plan well.  Patient also will be a candidate for antiestrogen therapy after completion of radiation.  I would like to take this opportunity to thank you for allowing me to participate in the care of your patient.Noreene Filbert, MD

## 2020-02-18 ENCOUNTER — Other Ambulatory Visit: Payer: Self-pay

## 2020-02-19 ENCOUNTER — Ambulatory Visit
Admission: RE | Admit: 2020-02-19 | Discharge: 2020-02-19 | Disposition: A | Discharge status: Home / Self Care | Payer: BC Managed Care – PPO | Source: Ambulatory Visit | Attending: Radiation Oncology | Admitting: Radiation Oncology

## 2020-02-19 DIAGNOSIS — C50411 Malignant neoplasm of upper-outer quadrant of right female breast: Secondary | ICD-10-CM | POA: Insufficient documentation

## 2020-02-19 DIAGNOSIS — Z510 Encounter for antineoplastic radiation therapy: Secondary | ICD-10-CM | POA: Diagnosis not present

## 2020-02-19 DIAGNOSIS — Z170 Estrogen receptor positive status [ER+]: Secondary | ICD-10-CM | POA: Diagnosis not present

## 2020-02-19 DIAGNOSIS — D0512 Intraductal carcinoma in situ of left breast: Secondary | ICD-10-CM | POA: Diagnosis not present

## 2020-02-24 DIAGNOSIS — D0512 Intraductal carcinoma in situ of left breast: Secondary | ICD-10-CM | POA: Diagnosis not present

## 2020-02-24 DIAGNOSIS — C50411 Malignant neoplasm of upper-outer quadrant of right female breast: Secondary | ICD-10-CM | POA: Diagnosis not present

## 2020-02-24 DIAGNOSIS — Z170 Estrogen receptor positive status [ER+]: Secondary | ICD-10-CM | POA: Diagnosis not present

## 2020-02-24 DIAGNOSIS — Z510 Encounter for antineoplastic radiation therapy: Secondary | ICD-10-CM | POA: Diagnosis not present

## 2020-02-25 ENCOUNTER — Ambulatory Visit: Admission: RE | Admit: 2020-02-25 | Payer: BC Managed Care – PPO | Source: Ambulatory Visit

## 2020-02-25 DIAGNOSIS — C50411 Malignant neoplasm of upper-outer quadrant of right female breast: Secondary | ICD-10-CM | POA: Diagnosis not present

## 2020-02-25 DIAGNOSIS — Z170 Estrogen receptor positive status [ER+]: Secondary | ICD-10-CM | POA: Diagnosis not present

## 2020-02-25 DIAGNOSIS — D0512 Intraductal carcinoma in situ of left breast: Secondary | ICD-10-CM | POA: Diagnosis not present

## 2020-02-25 DIAGNOSIS — Z510 Encounter for antineoplastic radiation therapy: Secondary | ICD-10-CM | POA: Diagnosis not present

## 2020-02-26 ENCOUNTER — Ambulatory Visit
Admission: RE | Admit: 2020-02-26 | Discharge: 2020-02-26 | Disposition: A | Discharge status: Home / Self Care | Payer: BC Managed Care – PPO | Source: Ambulatory Visit | Attending: Radiation Oncology | Admitting: Radiation Oncology

## 2020-02-26 DIAGNOSIS — C50411 Malignant neoplasm of upper-outer quadrant of right female breast: Secondary | ICD-10-CM | POA: Diagnosis not present

## 2020-02-26 DIAGNOSIS — Z510 Encounter for antineoplastic radiation therapy: Secondary | ICD-10-CM | POA: Diagnosis not present

## 2020-02-26 DIAGNOSIS — Z170 Estrogen receptor positive status [ER+]: Secondary | ICD-10-CM | POA: Diagnosis not present

## 2020-02-26 DIAGNOSIS — D0512 Intraductal carcinoma in situ of left breast: Secondary | ICD-10-CM | POA: Diagnosis not present

## 2020-02-27 ENCOUNTER — Ambulatory Visit
Admission: RE | Admit: 2020-02-27 | Discharge: 2020-02-27 | Disposition: A | Discharge status: Home / Self Care | Payer: BC Managed Care – PPO | Source: Ambulatory Visit | Attending: Radiation Oncology | Admitting: Radiation Oncology

## 2020-02-27 DIAGNOSIS — Z510 Encounter for antineoplastic radiation therapy: Secondary | ICD-10-CM | POA: Diagnosis not present

## 2020-02-27 DIAGNOSIS — C50411 Malignant neoplasm of upper-outer quadrant of right female breast: Secondary | ICD-10-CM | POA: Diagnosis not present

## 2020-02-27 DIAGNOSIS — D0512 Intraductal carcinoma in situ of left breast: Secondary | ICD-10-CM | POA: Diagnosis not present

## 2020-02-27 DIAGNOSIS — Z170 Estrogen receptor positive status [ER+]: Secondary | ICD-10-CM | POA: Diagnosis not present

## 2020-02-28 ENCOUNTER — Other Ambulatory Visit: Payer: Self-pay | Admitting: *Deleted

## 2020-02-28 ENCOUNTER — Ambulatory Visit
Admission: RE | Admit: 2020-02-28 | Discharge: 2020-02-28 | Disposition: A | Discharge status: Home / Self Care | Payer: BC Managed Care – PPO | Source: Ambulatory Visit | Attending: Radiation Oncology | Admitting: Radiation Oncology

## 2020-02-28 DIAGNOSIS — C50411 Malignant neoplasm of upper-outer quadrant of right female breast: Secondary | ICD-10-CM | POA: Diagnosis not present

## 2020-02-28 DIAGNOSIS — D0512 Intraductal carcinoma in situ of left breast: Secondary | ICD-10-CM

## 2020-02-28 DIAGNOSIS — Z170 Estrogen receptor positive status [ER+]: Secondary | ICD-10-CM | POA: Diagnosis not present

## 2020-02-28 DIAGNOSIS — Z510 Encounter for antineoplastic radiation therapy: Secondary | ICD-10-CM | POA: Diagnosis not present

## 2020-03-02 ENCOUNTER — Ambulatory Visit
Admission: RE | Admit: 2020-03-02 | Discharge: 2020-03-02 | Disposition: A | Discharge status: Home / Self Care | Payer: BC Managed Care – PPO | Source: Ambulatory Visit | Attending: Radiation Oncology | Admitting: Radiation Oncology

## 2020-03-02 DIAGNOSIS — C50411 Malignant neoplasm of upper-outer quadrant of right female breast: Secondary | ICD-10-CM | POA: Diagnosis not present

## 2020-03-02 DIAGNOSIS — Z170 Estrogen receptor positive status [ER+]: Secondary | ICD-10-CM | POA: Diagnosis not present

## 2020-03-02 DIAGNOSIS — Z510 Encounter for antineoplastic radiation therapy: Secondary | ICD-10-CM | POA: Diagnosis not present

## 2020-03-02 DIAGNOSIS — D0512 Intraductal carcinoma in situ of left breast: Secondary | ICD-10-CM | POA: Diagnosis not present

## 2020-03-03 ENCOUNTER — Ambulatory Visit
Admission: RE | Admit: 2020-03-03 | Discharge: 2020-03-03 | Disposition: A | Discharge status: Home / Self Care | Payer: BC Managed Care – PPO | Source: Ambulatory Visit | Attending: Radiation Oncology | Admitting: Radiation Oncology

## 2020-03-03 ENCOUNTER — Other Ambulatory Visit: Payer: Self-pay

## 2020-03-03 DIAGNOSIS — D0512 Intraductal carcinoma in situ of left breast: Secondary | ICD-10-CM | POA: Diagnosis not present

## 2020-03-03 DIAGNOSIS — Z170 Estrogen receptor positive status [ER+]: Secondary | ICD-10-CM | POA: Diagnosis not present

## 2020-03-03 DIAGNOSIS — Z510 Encounter for antineoplastic radiation therapy: Secondary | ICD-10-CM | POA: Diagnosis not present

## 2020-03-03 DIAGNOSIS — C50411 Malignant neoplasm of upper-outer quadrant of right female breast: Secondary | ICD-10-CM | POA: Diagnosis not present

## 2020-03-03 NOTE — Telephone Encounter (Signed)
Done.. Pt has been scheduled for lab/MD 1 week after her 4/19 [place] appt as requested Pt is aware her the schedule date and time.

## 2020-03-03 NOTE — Telephone Encounter (Signed)
Pt completes radiation on 4/19. Please schedule pt for lab/MD 1 week after, inform patient of appt details. Thanks.

## 2020-03-04 ENCOUNTER — Ambulatory Visit
Admission: RE | Admit: 2020-03-04 | Discharge: 2020-03-04 | Disposition: A | Discharge status: Home / Self Care | Payer: BC Managed Care – PPO | Source: Ambulatory Visit | Attending: Radiation Oncology | Admitting: Radiation Oncology

## 2020-03-04 DIAGNOSIS — Z170 Estrogen receptor positive status [ER+]: Secondary | ICD-10-CM | POA: Diagnosis not present

## 2020-03-04 DIAGNOSIS — C50411 Malignant neoplasm of upper-outer quadrant of right female breast: Secondary | ICD-10-CM | POA: Diagnosis not present

## 2020-03-04 DIAGNOSIS — Z510 Encounter for antineoplastic radiation therapy: Secondary | ICD-10-CM | POA: Diagnosis not present

## 2020-03-04 DIAGNOSIS — D0512 Intraductal carcinoma in situ of left breast: Secondary | ICD-10-CM | POA: Diagnosis not present

## 2020-03-05 ENCOUNTER — Ambulatory Visit
Admission: RE | Admit: 2020-03-05 | Discharge: 2020-03-05 | Disposition: A | Discharge status: Home / Self Care | Payer: BC Managed Care – PPO | Source: Ambulatory Visit | Attending: Radiation Oncology | Admitting: Radiation Oncology

## 2020-03-05 DIAGNOSIS — Z510 Encounter for antineoplastic radiation therapy: Secondary | ICD-10-CM | POA: Diagnosis not present

## 2020-03-05 DIAGNOSIS — C50411 Malignant neoplasm of upper-outer quadrant of right female breast: Secondary | ICD-10-CM | POA: Diagnosis not present

## 2020-03-05 DIAGNOSIS — Z170 Estrogen receptor positive status [ER+]: Secondary | ICD-10-CM | POA: Diagnosis not present

## 2020-03-05 DIAGNOSIS — D0512 Intraductal carcinoma in situ of left breast: Secondary | ICD-10-CM | POA: Diagnosis not present

## 2020-03-06 ENCOUNTER — Ambulatory Visit
Admission: RE | Admit: 2020-03-06 | Discharge: 2020-03-06 | Disposition: A | Discharge status: Home / Self Care | Payer: BC Managed Care – PPO | Source: Ambulatory Visit | Attending: Radiation Oncology | Admitting: Radiation Oncology

## 2020-03-06 DIAGNOSIS — D0512 Intraductal carcinoma in situ of left breast: Secondary | ICD-10-CM | POA: Diagnosis not present

## 2020-03-06 DIAGNOSIS — C50411 Malignant neoplasm of upper-outer quadrant of right female breast: Secondary | ICD-10-CM | POA: Diagnosis not present

## 2020-03-06 DIAGNOSIS — Z510 Encounter for antineoplastic radiation therapy: Secondary | ICD-10-CM | POA: Diagnosis not present

## 2020-03-06 DIAGNOSIS — Z170 Estrogen receptor positive status [ER+]: Secondary | ICD-10-CM | POA: Diagnosis not present

## 2020-03-09 ENCOUNTER — Ambulatory Visit
Admission: RE | Admit: 2020-03-09 | Discharge: 2020-03-09 | Disposition: A | Discharge status: Home / Self Care | Payer: BC Managed Care – PPO | Source: Ambulatory Visit | Attending: Radiation Oncology | Admitting: Radiation Oncology

## 2020-03-09 DIAGNOSIS — C50411 Malignant neoplasm of upper-outer quadrant of right female breast: Secondary | ICD-10-CM | POA: Diagnosis not present

## 2020-03-09 DIAGNOSIS — D0512 Intraductal carcinoma in situ of left breast: Secondary | ICD-10-CM | POA: Diagnosis not present

## 2020-03-09 DIAGNOSIS — Z170 Estrogen receptor positive status [ER+]: Secondary | ICD-10-CM | POA: Diagnosis not present

## 2020-03-09 DIAGNOSIS — Z510 Encounter for antineoplastic radiation therapy: Secondary | ICD-10-CM | POA: Diagnosis not present

## 2020-03-10 ENCOUNTER — Ambulatory Visit
Admission: RE | Admit: 2020-03-10 | Discharge: 2020-03-10 | Disposition: A | Discharge status: Home / Self Care | Payer: BC Managed Care – PPO | Source: Ambulatory Visit | Attending: Radiation Oncology | Admitting: Radiation Oncology

## 2020-03-10 DIAGNOSIS — D0512 Intraductal carcinoma in situ of left breast: Secondary | ICD-10-CM | POA: Diagnosis not present

## 2020-03-10 DIAGNOSIS — C50411 Malignant neoplasm of upper-outer quadrant of right female breast: Secondary | ICD-10-CM | POA: Diagnosis not present

## 2020-03-10 DIAGNOSIS — Z170 Estrogen receptor positive status [ER+]: Secondary | ICD-10-CM | POA: Diagnosis not present

## 2020-03-10 DIAGNOSIS — Z510 Encounter for antineoplastic radiation therapy: Secondary | ICD-10-CM | POA: Diagnosis not present

## 2020-03-11 ENCOUNTER — Inpatient Hospital Stay: Payer: BC Managed Care – PPO | Attending: Radiation Oncology

## 2020-03-11 ENCOUNTER — Other Ambulatory Visit: Payer: Self-pay

## 2020-03-11 ENCOUNTER — Ambulatory Visit
Admission: RE | Admit: 2020-03-11 | Discharge: 2020-03-11 | Disposition: A | Discharge status: Home / Self Care | Payer: BC Managed Care – PPO | Source: Ambulatory Visit | Attending: Radiation Oncology | Admitting: Radiation Oncology

## 2020-03-11 DIAGNOSIS — Z170 Estrogen receptor positive status [ER+]: Secondary | ICD-10-CM | POA: Insufficient documentation

## 2020-03-11 DIAGNOSIS — D0512 Intraductal carcinoma in situ of left breast: Secondary | ICD-10-CM | POA: Insufficient documentation

## 2020-03-11 DIAGNOSIS — Z79810 Long term (current) use of selective estrogen receptor modulators (SERMs): Secondary | ICD-10-CM | POA: Diagnosis not present

## 2020-03-11 DIAGNOSIS — C50411 Malignant neoplasm of upper-outer quadrant of right female breast: Secondary | ICD-10-CM | POA: Diagnosis not present

## 2020-03-11 DIAGNOSIS — Z510 Encounter for antineoplastic radiation therapy: Secondary | ICD-10-CM | POA: Diagnosis not present

## 2020-03-11 LAB — CBC
HCT: 40.9 % (ref 36.0–46.0)
Hemoglobin: 13.6 g/dL (ref 12.0–15.0)
MCH: 30.6 pg (ref 26.0–34.0)
MCHC: 33.3 g/dL (ref 30.0–36.0)
MCV: 91.9 fL (ref 80.0–100.0)
Platelets: 224 10*3/uL (ref 150–400)
RBC: 4.45 MIL/uL (ref 3.87–5.11)
RDW: 12.5 % (ref 11.5–15.5)
WBC: 4.1 10*3/uL (ref 4.0–10.5)
nRBC: 0 % (ref 0.0–0.2)

## 2020-03-12 ENCOUNTER — Ambulatory Visit
Admission: RE | Admit: 2020-03-12 | Discharge: 2020-03-12 | Disposition: A | Discharge status: Home / Self Care | Payer: BC Managed Care – PPO | Source: Ambulatory Visit | Attending: Radiation Oncology | Admitting: Radiation Oncology

## 2020-03-12 DIAGNOSIS — Z170 Estrogen receptor positive status [ER+]: Secondary | ICD-10-CM | POA: Diagnosis not present

## 2020-03-12 DIAGNOSIS — Z510 Encounter for antineoplastic radiation therapy: Secondary | ICD-10-CM | POA: Insufficient documentation

## 2020-03-12 DIAGNOSIS — C50411 Malignant neoplasm of upper-outer quadrant of right female breast: Secondary | ICD-10-CM | POA: Insufficient documentation

## 2020-03-12 DIAGNOSIS — D0512 Intraductal carcinoma in situ of left breast: Secondary | ICD-10-CM | POA: Diagnosis not present

## 2020-03-13 ENCOUNTER — Ambulatory Visit
Admission: RE | Admit: 2020-03-13 | Discharge: 2020-03-13 | Disposition: A | Discharge status: Home / Self Care | Payer: BC Managed Care – PPO | Source: Ambulatory Visit | Attending: Radiation Oncology | Admitting: Radiation Oncology

## 2020-03-13 DIAGNOSIS — Z170 Estrogen receptor positive status [ER+]: Secondary | ICD-10-CM | POA: Diagnosis not present

## 2020-03-13 DIAGNOSIS — Z510 Encounter for antineoplastic radiation therapy: Secondary | ICD-10-CM | POA: Diagnosis not present

## 2020-03-13 DIAGNOSIS — D0512 Intraductal carcinoma in situ of left breast: Secondary | ICD-10-CM | POA: Diagnosis not present

## 2020-03-13 DIAGNOSIS — C50411 Malignant neoplasm of upper-outer quadrant of right female breast: Secondary | ICD-10-CM | POA: Diagnosis not present

## 2020-03-14 ENCOUNTER — Ambulatory Visit: Payer: BC Managed Care – PPO | Attending: Internal Medicine

## 2020-03-14 ENCOUNTER — Other Ambulatory Visit: Payer: Self-pay

## 2020-03-14 DIAGNOSIS — Z23 Encounter for immunization: Secondary | ICD-10-CM

## 2020-03-14 NOTE — Progress Notes (Signed)
   Covid-19 Vaccination Clinic  Name:  Rose Bernard    MRN: [id number] DOB: 1966-03-26  03/14/2020  [name] was observed post Covid-19 immunization for 15 minutes without incident. She was provided with Vaccine Information Sheet and instruction to access the V-Safe system.   [name] was instructed to call 911 with any severe reactions post vaccine: Marland Kitchen Difficulty breathing  . Swelling of face and throat  . A fast heartbeat  . A bad rash all over body  . Dizziness and weakness   Immunizations Administered    Name Date Dose VIS Date Route   Moderna COVID-19 Vaccine 03/14/2020  9:13 AM 0.5 mL 11/12/2019 Intramuscular   Manufacturer: Levan Hurst   [id number]   [id number]

## 2020-03-16 ENCOUNTER — Ambulatory Visit
Admission: RE | Admit: 2020-03-16 | Discharge: 2020-03-16 | Disposition: A | Discharge status: Home / Self Care | Payer: BC Managed Care – PPO | Source: Ambulatory Visit | Attending: Radiation Oncology | Admitting: Radiation Oncology

## 2020-03-16 DIAGNOSIS — Z510 Encounter for antineoplastic radiation therapy: Secondary | ICD-10-CM | POA: Diagnosis not present

## 2020-03-16 DIAGNOSIS — D0512 Intraductal carcinoma in situ of left breast: Secondary | ICD-10-CM | POA: Diagnosis not present

## 2020-03-16 DIAGNOSIS — C50411 Malignant neoplasm of upper-outer quadrant of right female breast: Secondary | ICD-10-CM | POA: Diagnosis not present

## 2020-03-16 DIAGNOSIS — Z170 Estrogen receptor positive status [ER+]: Secondary | ICD-10-CM | POA: Diagnosis not present

## 2020-03-17 ENCOUNTER — Ambulatory Visit
Admission: RE | Admit: 2020-03-17 | Discharge: 2020-03-17 | Disposition: A | Discharge status: Home / Self Care | Payer: BC Managed Care – PPO | Source: Ambulatory Visit | Attending: Radiation Oncology | Admitting: Radiation Oncology

## 2020-03-17 DIAGNOSIS — Z170 Estrogen receptor positive status [ER+]: Secondary | ICD-10-CM | POA: Diagnosis not present

## 2020-03-17 DIAGNOSIS — Z510 Encounter for antineoplastic radiation therapy: Secondary | ICD-10-CM | POA: Diagnosis not present

## 2020-03-17 DIAGNOSIS — C50411 Malignant neoplasm of upper-outer quadrant of right female breast: Secondary | ICD-10-CM | POA: Diagnosis not present

## 2020-03-17 DIAGNOSIS — D0512 Intraductal carcinoma in situ of left breast: Secondary | ICD-10-CM | POA: Diagnosis not present

## 2020-03-18 ENCOUNTER — Ambulatory Visit
Admission: RE | Admit: 2020-03-18 | Discharge: 2020-03-18 | Disposition: A | Discharge status: Home / Self Care | Payer: BC Managed Care – PPO | Source: Ambulatory Visit | Attending: Radiation Oncology | Admitting: Radiation Oncology

## 2020-03-18 DIAGNOSIS — Z510 Encounter for antineoplastic radiation therapy: Secondary | ICD-10-CM | POA: Diagnosis not present

## 2020-03-18 DIAGNOSIS — C50411 Malignant neoplasm of upper-outer quadrant of right female breast: Secondary | ICD-10-CM | POA: Diagnosis not present

## 2020-03-18 DIAGNOSIS — Z170 Estrogen receptor positive status [ER+]: Secondary | ICD-10-CM | POA: Diagnosis not present

## 2020-03-18 DIAGNOSIS — D0512 Intraductal carcinoma in situ of left breast: Secondary | ICD-10-CM | POA: Diagnosis not present

## 2020-03-19 ENCOUNTER — Ambulatory Visit
Admission: RE | Admit: 2020-03-19 | Discharge: 2020-03-19 | Disposition: A | Discharge status: Home / Self Care | Payer: BC Managed Care – PPO | Source: Ambulatory Visit | Attending: Radiation Oncology | Admitting: Radiation Oncology

## 2020-03-19 DIAGNOSIS — Z170 Estrogen receptor positive status [ER+]: Secondary | ICD-10-CM | POA: Diagnosis not present

## 2020-03-19 DIAGNOSIS — C50411 Malignant neoplasm of upper-outer quadrant of right female breast: Secondary | ICD-10-CM | POA: Diagnosis not present

## 2020-03-19 DIAGNOSIS — Z510 Encounter for antineoplastic radiation therapy: Secondary | ICD-10-CM | POA: Diagnosis not present

## 2020-03-20 ENCOUNTER — Ambulatory Visit
Admission: RE | Admit: 2020-03-20 | Discharge: 2020-03-20 | Disposition: A | Discharge status: Home / Self Care | Payer: BC Managed Care – PPO | Source: Ambulatory Visit | Attending: Radiation Oncology | Admitting: Radiation Oncology

## 2020-03-20 DIAGNOSIS — Z510 Encounter for antineoplastic radiation therapy: Secondary | ICD-10-CM | POA: Diagnosis not present

## 2020-03-20 DIAGNOSIS — Z170 Estrogen receptor positive status [ER+]: Secondary | ICD-10-CM | POA: Diagnosis not present

## 2020-03-20 DIAGNOSIS — C50411 Malignant neoplasm of upper-outer quadrant of right female breast: Secondary | ICD-10-CM | POA: Diagnosis not present

## 2020-03-23 ENCOUNTER — Ambulatory Visit
Admission: RE | Admit: 2020-03-23 | Discharge: 2020-03-23 | Disposition: A | Discharge status: Home / Self Care | Payer: BC Managed Care – PPO | Source: Ambulatory Visit | Attending: Radiation Oncology | Admitting: Radiation Oncology

## 2020-03-23 DIAGNOSIS — C50411 Malignant neoplasm of upper-outer quadrant of right female breast: Secondary | ICD-10-CM | POA: Diagnosis not present

## 2020-03-23 DIAGNOSIS — Z170 Estrogen receptor positive status [ER+]: Secondary | ICD-10-CM | POA: Diagnosis not present

## 2020-03-23 DIAGNOSIS — Z510 Encounter for antineoplastic radiation therapy: Secondary | ICD-10-CM | POA: Diagnosis not present

## 2020-03-24 ENCOUNTER — Ambulatory Visit
Admission: RE | Admit: 2020-03-24 | Discharge: 2020-03-24 | Disposition: A | Discharge status: Home / Self Care | Payer: BC Managed Care – PPO | Source: Ambulatory Visit | Attending: Radiation Oncology | Admitting: Radiation Oncology

## 2020-03-24 DIAGNOSIS — Z170 Estrogen receptor positive status [ER+]: Secondary | ICD-10-CM | POA: Diagnosis not present

## 2020-03-24 DIAGNOSIS — Z510 Encounter for antineoplastic radiation therapy: Secondary | ICD-10-CM | POA: Diagnosis not present

## 2020-03-24 DIAGNOSIS — C50411 Malignant neoplasm of upper-outer quadrant of right female breast: Secondary | ICD-10-CM | POA: Diagnosis not present

## 2020-03-24 DIAGNOSIS — D0512 Intraductal carcinoma in situ of left breast: Secondary | ICD-10-CM | POA: Diagnosis not present

## 2020-03-25 ENCOUNTER — Ambulatory Visit
Admission: RE | Admit: 2020-03-25 | Discharge: 2020-03-25 | Disposition: A | Discharge status: Home / Self Care | Payer: BC Managed Care – PPO | Source: Ambulatory Visit | Attending: Radiation Oncology | Admitting: Radiation Oncology

## 2020-03-25 ENCOUNTER — Inpatient Hospital Stay: Payer: BC Managed Care – PPO | Attending: Radiation Oncology

## 2020-03-25 ENCOUNTER — Other Ambulatory Visit: Payer: Self-pay

## 2020-03-25 DIAGNOSIS — Z803 Family history of malignant neoplasm of breast: Secondary | ICD-10-CM | POA: Insufficient documentation

## 2020-03-25 DIAGNOSIS — Z79810 Long term (current) use of selective estrogen receptor modulators (SERMs): Secondary | ICD-10-CM | POA: Insufficient documentation

## 2020-03-25 DIAGNOSIS — Z800 Family history of malignant neoplasm of digestive organs: Secondary | ICD-10-CM | POA: Diagnosis not present

## 2020-03-25 DIAGNOSIS — D0512 Intraductal carcinoma in situ of left breast: Secondary | ICD-10-CM | POA: Insufficient documentation

## 2020-03-25 DIAGNOSIS — Z79899 Other long term (current) drug therapy: Secondary | ICD-10-CM | POA: Diagnosis not present

## 2020-03-25 DIAGNOSIS — L598 Other specified disorders of the skin and subcutaneous tissue related to radiation: Secondary | ICD-10-CM | POA: Insufficient documentation

## 2020-03-25 DIAGNOSIS — Z923 Personal history of irradiation: Secondary | ICD-10-CM | POA: Insufficient documentation

## 2020-03-25 DIAGNOSIS — C50411 Malignant neoplasm of upper-outer quadrant of right female breast: Secondary | ICD-10-CM | POA: Diagnosis not present

## 2020-03-25 DIAGNOSIS — K219 Gastro-esophageal reflux disease without esophagitis: Secondary | ICD-10-CM | POA: Diagnosis not present

## 2020-03-25 DIAGNOSIS — Z170 Estrogen receptor positive status [ER+]: Secondary | ICD-10-CM | POA: Diagnosis not present

## 2020-03-25 DIAGNOSIS — Z510 Encounter for antineoplastic radiation therapy: Secondary | ICD-10-CM | POA: Diagnosis not present

## 2020-03-25 LAB — CBC
HCT: 39.2 % (ref 36.0–46.0)
Hemoglobin: 12.9 g/dL (ref 12.0–15.0)
MCH: 30.4 pg (ref 26.0–34.0)
MCHC: 32.9 g/dL (ref 30.0–36.0)
MCV: 92.5 fL (ref 80.0–100.0)
Platelets: 241 10*3/uL (ref 150–400)
RBC: 4.24 MIL/uL (ref 3.87–5.11)
RDW: 12.7 % (ref 11.5–15.5)
WBC: 4.1 10*3/uL (ref 4.0–10.5)
nRBC: 0 % (ref 0.0–0.2)

## 2020-03-26 ENCOUNTER — Ambulatory Visit
Admission: RE | Admit: 2020-03-26 | Discharge: 2020-03-26 | Disposition: A | Discharge status: Home / Self Care | Payer: BC Managed Care – PPO | Source: Ambulatory Visit | Attending: Radiation Oncology | Admitting: Radiation Oncology

## 2020-03-26 DIAGNOSIS — Z510 Encounter for antineoplastic radiation therapy: Secondary | ICD-10-CM | POA: Diagnosis not present

## 2020-03-26 DIAGNOSIS — Z170 Estrogen receptor positive status [ER+]: Secondary | ICD-10-CM | POA: Diagnosis not present

## 2020-03-26 DIAGNOSIS — C50411 Malignant neoplasm of upper-outer quadrant of right female breast: Secondary | ICD-10-CM | POA: Diagnosis not present

## 2020-03-27 ENCOUNTER — Ambulatory Visit
Admission: RE | Admit: 2020-03-27 | Discharge: 2020-03-27 | Disposition: A | Discharge status: Home / Self Care | Payer: BC Managed Care – PPO | Source: Ambulatory Visit | Attending: Radiation Oncology | Admitting: Radiation Oncology

## 2020-03-27 DIAGNOSIS — Z170 Estrogen receptor positive status [ER+]: Secondary | ICD-10-CM | POA: Diagnosis not present

## 2020-03-27 DIAGNOSIS — C50411 Malignant neoplasm of upper-outer quadrant of right female breast: Secondary | ICD-10-CM | POA: Diagnosis not present

## 2020-03-27 DIAGNOSIS — Z510 Encounter for antineoplastic radiation therapy: Secondary | ICD-10-CM | POA: Diagnosis not present

## 2020-03-30 ENCOUNTER — Ambulatory Visit
Admission: RE | Admit: 2020-03-30 | Discharge: 2020-03-30 | Disposition: A | Discharge status: Home / Self Care | Payer: BC Managed Care – PPO | Source: Ambulatory Visit | Attending: Radiation Oncology | Admitting: Radiation Oncology

## 2020-03-30 DIAGNOSIS — C50411 Malignant neoplasm of upper-outer quadrant of right female breast: Secondary | ICD-10-CM | POA: Diagnosis not present

## 2020-03-30 DIAGNOSIS — Z170 Estrogen receptor positive status [ER+]: Secondary | ICD-10-CM | POA: Diagnosis not present

## 2020-03-30 DIAGNOSIS — Z510 Encounter for antineoplastic radiation therapy: Secondary | ICD-10-CM | POA: Diagnosis not present

## 2020-03-30 DIAGNOSIS — D0512 Intraductal carcinoma in situ of left breast: Secondary | ICD-10-CM | POA: Diagnosis not present

## 2020-04-06 ENCOUNTER — Other Ambulatory Visit: Payer: Self-pay

## 2020-04-06 ENCOUNTER — Inpatient Hospital Stay (HOSPITAL_BASED_OUTPATIENT_CLINIC_OR_DEPARTMENT_OTHER): Payer: BC Managed Care – PPO | Admitting: Oncology

## 2020-04-06 ENCOUNTER — Encounter: Payer: Self-pay | Admitting: Oncology

## 2020-04-06 ENCOUNTER — Inpatient Hospital Stay: Payer: BC Managed Care – PPO

## 2020-04-06 VITALS — BP 164/78 | HR 82 | Temp 96.0°F | Resp 16 | Wt 147.6 lb

## 2020-04-06 DIAGNOSIS — Z800 Family history of malignant neoplasm of digestive organs: Secondary | ICD-10-CM | POA: Diagnosis not present

## 2020-04-06 DIAGNOSIS — D0512 Intraductal carcinoma in situ of left breast: Secondary | ICD-10-CM

## 2020-04-06 DIAGNOSIS — L589 Radiodermatitis, unspecified: Secondary | ICD-10-CM | POA: Diagnosis not present

## 2020-04-06 DIAGNOSIS — L598 Other specified disorders of the skin and subcutaneous tissue related to radiation: Secondary | ICD-10-CM | POA: Diagnosis not present

## 2020-04-06 DIAGNOSIS — Z803 Family history of malignant neoplasm of breast: Secondary | ICD-10-CM | POA: Diagnosis not present

## 2020-04-06 DIAGNOSIS — Z79810 Long term (current) use of selective estrogen receptor modulators (SERMs): Secondary | ICD-10-CM | POA: Diagnosis not present

## 2020-04-06 DIAGNOSIS — Z923 Personal history of irradiation: Secondary | ICD-10-CM | POA: Diagnosis not present

## 2020-04-06 DIAGNOSIS — Z79899 Other long term (current) drug therapy: Secondary | ICD-10-CM | POA: Diagnosis not present

## 2020-04-06 DIAGNOSIS — K219 Gastro-esophageal reflux disease without esophagitis: Secondary | ICD-10-CM | POA: Diagnosis not present

## 2020-04-06 LAB — CBC WITH DIFFERENTIAL/PLATELET
Abs Immature Granulocytes: 0.01 10*3/uL (ref 0.00–0.07)
Basophils Absolute: 0 10*3/uL (ref 0.0–0.1)
Basophils Relative: 1 %
Eosinophils Absolute: 0.2 10*3/uL (ref 0.0–0.5)
Eosinophils Relative: 6 %
HCT: 39.2 % (ref 36.0–46.0)
Hemoglobin: 13 g/dL (ref 12.0–15.0)
Immature Granulocytes: 0 %
Lymphocytes Relative: 28 %
Lymphs Abs: 1 10*3/uL (ref 0.7–4.0)
MCH: 31 pg (ref 26.0–34.0)
MCHC: 33.2 g/dL (ref 30.0–36.0)
MCV: 93.3 fL (ref 80.0–100.0)
Monocytes Absolute: 0.4 10*3/uL (ref 0.1–1.0)
Monocytes Relative: 12 %
Neutro Abs: 1.9 10*3/uL (ref 1.7–7.7)
Neutrophils Relative %: 53 %
Platelets: 242 10*3/uL (ref 150–400)
RBC: 4.2 MIL/uL (ref 3.87–5.11)
RDW: 12.6 % (ref 11.5–15.5)
WBC: 3.6 10*3/uL — ABNORMAL LOW (ref 4.0–10.5)
nRBC: 0 % (ref 0.0–0.2)

## 2020-04-06 LAB — COMPREHENSIVE METABOLIC PANEL
ALT: 19 U/L (ref 0–44)
AST: 18 U/L (ref 15–41)
Albumin: 4 g/dL (ref 3.5–5.0)
Alkaline Phosphatase: 77 U/L (ref 38–126)
Anion gap: 11 (ref 5–15)
BUN: 16 mg/dL (ref 6–20)
CO2: 27 mmol/L (ref 22–32)
Calcium: 8.8 mg/dL — ABNORMAL LOW (ref 8.9–10.3)
Chloride: 104 mmol/L (ref 98–111)
Creatinine, Ser: 0.75 mg/dL (ref 0.44–1.00)
GFR calc Af Amer: >60 mL/min (ref >60)
GFR calc non Af Amer: >60 mL/min (ref >60)
Glucose, Bld: 70 mg/dL (ref 70–99)
Potassium: 3.5 mmol/L (ref 3.5–5.1)
Sodium: 142 mmol/L (ref 135–145)
Total Bilirubin: 0.7 mg/dL (ref 0.3–1.2)
Total Protein: 7.4 g/dL (ref 6.5–8.1)

## 2020-04-06 MED ORDER — SILVER SULFADIAZINE 1 % EX CREA: 1.0000 "application " | TOPICAL_CREAM | Freq: Two times a day (BID) | CUTANEOUS | 0 refills | Status: DC

## 2020-04-06 NOTE — Progress Notes (Signed)
Left side chest is red, painful, with drainage that has not improved with Cortisone or Aquaphor creams.

## 2020-04-06 NOTE — Progress Notes (Signed)
Hematology/Oncology  Follow up note [facility] Telephone:([phone]) [phone] [phone]   Patient Care Team: Einar Pheasant, MD as PCP - General (Internal Medicine) Einar Pheasant, MD (Internal Medicine) Bary Castilla, Forest Gleason, MD (General Surgery)  REFERRING PROVIDER: Einar Pheasant, MD  CHIEF COMPLAINTS/REASON FOR VISIT:  Follow up for DCIS  HISTORY OF PRESENTING ILLNESS:   Rose Bernard is a  54 y.o.  female with PMH listed below was seen in consultation at the request of  Einar Pheasant, MD  for evaluation of DCIS. 10/15/2019 screening bilateral mammogram showed possible left breast mass and calcification. 10/25/2019 patient had unilateral diagnostic mammogram of left breast which showed indeterminate mass in the left breast at 4:00.  Patient underwent ultrasound-guided biopsy of the left breast mass 12/12/2019 left breast ultrasound-guided biopsy showed atypical ductal hyperplastic. Patient was evaluated by Dr. Peyton Najjar and had left breast mass excisional biopsy on 01/10/2020. Pathology showed DCIS, nuclear grade 1-2, background columnar cell changes, papillary change, fibrocystic changes.  Size of the DCIS 21 mm, margins positive for DCIS in the anterior and inferior multifocal.  Patient was referred to cancer center for discussion of further management. Today she denies any new complaints.  Some soreness at the site of excisional biopsy. Menarche age 89. Patient had a hysterectomy at age of 77. She takes estradiol for hot flash symptoms for about a year half. Family history positive for maternal aunt and paternal aunt were both diagnosed with breast cancer, her father was diagnosed with colon cancer.  She had remote breast biopsy in 1991.   Denies any chest radiation..  INTERVAL HISTORY Rose Bernard is a 54 y.o. female who has above history reviewed by me today presents for follow up visit for management of breast cancer. Problems and  complaints are listed below: Patient finished adjuvant radiation on 03/30/2020. Today she reports left breast area of erythema, broken skin and pain.   Review of Systems  Constitutional: Negative for appetite change, chills, fatigue and fever.  HENT:   Negative for hearing loss and voice change.   Eyes: Negative for eye problems.  Respiratory: Negative for chest tightness and cough.   Cardiovascular: Negative for chest pain.  Gastrointestinal: Negative for abdominal distention, abdominal pain and blood in stool.  Endocrine: Negative for hot flashes.  Genitourinary: Negative for difficulty urinating and frequency.   Musculoskeletal: Negative for arthralgias.  Skin: Negative for itching and rash.  Neurological: Negative for extremity weakness.  Hematological: Negative for adenopathy.  Psychiatric/Behavioral: Negative for confusion.    MEDICAL HISTORY:  Past Medical History:  Diagnosis Date  . Chronic cystitis   . Frequent UTI   . GERD (gastroesophageal reflux disease)   . History of kidney stones   . Incomplete bladder emptying   . 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 ABDOMINAL HYSTERECTOMY  1995   2/2 delivery complications with 3rd child. Hysterectomy and bladder repair with subsequent appendectomy. L Ovary remain in place   . APPENDECTOMY     2001 due to scar tissue  . BLADDER BOTOX  2014, 2015, 2016  . BREAST BIOPSY Bilateral 1991  . BREAST BIOPSY Left 12/12/2019   [place] bx, path pending  . BREAST SURGERY Bilateral 2002   benign mass removed  . COLONOSCOPY  2014  . EXCISION OF BREAST BIOPSY Left 01/10/2020   Procedure: EXCISION OF BREAST BIOPSY w/ Needle localization;  Surgeon: Herbert Pun, MD;  Location: [facility] ORS;  Service: General;  Laterality: Left;  . FOOT SURGERY Bilateral   . LAPAROSCOPIC HYSTERECTOMY  1995  . MASTECTOMY, PARTIAL Left 01/31/2020   Procedure: MASTECTOMY  PARTIAL;  Surgeon: Herbert Pun, MD;  Location: [facility] ORS;  Service: General;  Laterality: Left;  . reconstruction of bladder   1995    2/2 delivery complications with 3rd child     SOCIAL HISTORY: Social History   Socioeconomic History  . Marital status: Married    Spouse name: Not on file  . Number of children: 2  . Years of education: Not on file  . Highest education level: Not on file  Occupational History  . Not on file  Tobacco Use  . Smoking status: Never Smoker  . Smokeless tobacco: Never Used  Substance and Sexual Activity  . Alcohol use: No    Alcohol/week: 0.0 standard drinks  . Drug use: No  . Sexual activity: Yes    Birth control/protection: Surgical  Other Topics Concern  . Not on file  Social History Narrative  . Not on file   Social Determinants of Health   Financial Resource Strain:   . Difficulty of Paying Living Expenses:   Food Insecurity:   . Worried About Charity fundraiser in the Last Year:   . Arboriculturist in the Last Year:   Transportation Needs:   . Film/video editor (Medical):   Marland Kitchen Lack of Transportation (Non-Medical):   Physical Activity:   . Days of Exercise per Week:   . Minutes of Exercise per Session:   Stress:   . Feeling of Stress :   Social Connections:   . Frequency of Communication with Friends and Family:   . Frequency of Social Gatherings with Friends and Family:   . Attends Religious Services:   . Active Member of Clubs or Organizations:   . Attends Archivist Meetings:   Marland Kitchen Marital Status:   Intimate Partner Violence:   . Fear of Current or Ex-Partner:   . Emotionally Abused:   Marland Kitchen Physically Abused:   . Sexually Abused:     FAMILY HISTORY: Family History  Problem Relation Age of Onset  . Colon cancer Father        With ostomy in place   . Breast cancer Paternal Aunt 68  . Breast cancer Maternal Aunt   . Breast cancer Maternal Aunt     ALLERGIES:  is allergic to penicillin v  potassium.  MEDICATIONS:  Current Outpatient Medications  Medication Sig Dispense Refill  . esomeprazole (NEXIUM) 20 MG capsule Take 20 mg by mouth daily as needed (acid reflux).     . silver sulfADIAZINE (SILVADENE) 1 % cream Apply 1 application topically 2 (two) times daily. Apply to affected area twice a day. 50 g 0   No current facility-administered medications for this visit.     PHYSICAL EXAMINATION: ECOG PERFORMANCE STATUS: 1 - Symptomatic but completely ambulatory Vitals:   04/06/20 0959  BP: (!) 164/78  Pulse: 82  Resp: 16  Temp: (!) 96 F (35.6 C)   Filed Weights   04/06/20 0959  Weight: 147 lb 9.6 oz (67 kg)    Physical Exam Constitutional:      General: She is not in acute distress. HENT:     Head: Normocephalic and atraumatic.  Eyes:     General: No scleral icterus. Cardiovascular:     Rate and Rhythm: Normal rate and regular rhythm.     Heart sounds: Normal heart sounds.  Pulmonary:     Effort: Pulmonary effort is normal. No respiratory distress.     Breath sounds: No wheezing.  Abdominal:     General: Bowel sounds are normal. There is no distension.     Palpations: Abdomen is soft.  Musculoskeletal:        General: No deformity. Normal range of motion.     Cervical back: Normal range of motion and neck supple.  Skin:    General: Skin is warm and dry.     Findings: No erythema or rash.  Neurological:     Mental Status: She is alert and oriented to person, place, and time. Mental status is at baseline.     Cranial Nerves: No cranial nerve deficit.     Coordination: Coordination normal.  Psychiatric:        Mood and Affect: Mood normal.   Breast exam was performed in seated and lying down position. Patient is status post lumpectomy with healing surgical scar Status post radiation.  Left breast showed erythema, defined moist desquamation    LABORATORY DATA:  I have reviewed the data as listed Lab Results  Component Value Date   WBC 3.6 (L)  04/06/2020   HGB 13.0 04/06/2020   HCT 39.2 04/06/2020   MCV 93.3 04/06/2020   PLT 242 04/06/2020   Recent Labs    01/21/20 1044 04/06/20 0931  NA 140 142  K 4.0 3.5  CL 103 104  CO2 28 27  GLUCOSE 89 70  BUN 17 16  CREATININE 0.83 0.75  CALCIUM 8.7* 8.8*  GFRNONAA >60 >60  GFRAA >60 >60  PROT 7.5 7.4  ALBUMIN 4.1 4.0  AST 17 18  ALT 20 19  ALKPHOS 61 77  BILITOT 0.5 0.7   Iron/TIBC/Ferritin/ %Sat    Component Value Date/Time   IRON 94 04/20/2018 1611   TIBC 360 04/20/2018 1611   FERRITIN 11 04/20/2018 1611   IRONPCTSAT 26 04/20/2018 1611      RADIOGRAPHIC STUDIES: I have personally reviewed the radiological images as listed and agreed with the findings in the report.  No results found.    ASSESSMENT & PLAN:  1. Ductal carcinoma in situ (DCIS) of left breast   2. Radiation dermatitis    Left DCIS, grade 1-2 Initial lumpectomy showed positive DCIS margin. Patient underwent reexcision and achieved a negative margin. Patient has finished adjuvant radiation.  efficiency had been accomplished with Dr. Thomas Hoff Per physical examination and clinical history, patient has radiation dermatitis, grade 2. I recommend patient to use silver sulfadiazine cream to cover the area of skin irritation.  Adjuvant endocrine treatments were discussed with patient Patient was previously on estradiol which she had discontinued per my recommendation. I will check FSH, LDH, estradiol next week.  If she is confirmed to be postmenopausal, I recommend 5 years of aromatase inhibitor.  If she is perimenopausal, then the recommendation is tamoxifen Rationale of using aromatase inhibitor -Arimidex  discussed with patient.  Side effects of Arimidex including but not limited to hot flush, joint pain, fatigue, mood swing, osteoporosis discussed with patient. Patient voices understanding and willing to proceed if she is postmenopausal. If she is perimenopausal, will start her on tamoxifen.  I  have discussed with her during previous visit regarding rationale and side effects of tamoxifen.  Bone density baseline to be obtained if patient is going to be on aromatase inhibitor.   Orders Placed This Encounter  Procedures  . FSH/LH    Standing Status:   Future  Standing Expiration Date:   04/06/2021  . Estradiol    Standing Status:   Future    Standing Expiration Date:   04/06/2021  . CBC with Differential/Platelet    Standing Status:   Future    Standing Expiration Date:   04/06/2021  . Comprehensive metabolic panel    Standing Status:   Future    Standing Expiration Date:   04/06/2021    All questions were answered. The patient knows to call the clinic with any problems questions or concerns.  cc Einar Pheasant, MD    Return of visit: 6 weeks Thank you for this kind referral and the opportunity to participate in the care of this patient. A copy of today's note is routed to referring provider    Earlie Server, MD, PhD Hematology Oncology [facility] Pager- [phone] 04/06/2020

## 2020-04-08 ENCOUNTER — Ambulatory Visit: Payer: BC Managed Care – PPO | Attending: Internal Medicine

## 2020-04-08 DIAGNOSIS — Z23 Encounter for immunization: Secondary | ICD-10-CM

## 2020-04-08 NOTE — Progress Notes (Signed)
   Covid-19 Vaccination Clinic  Name:  Rose Bernard    MRN: [id number] DOB: 12-Mar-1966  04/08/2020  [name] was observed post Covid-19 immunization for 15 minutes without incident. She was provided with Vaccine Information Sheet and instruction to access the V-Safe system.   [name] was instructed to call 911 with any severe reactions post vaccine: Marland Kitchen Difficulty breathing  . Swelling of face and throat  . A fast heartbeat  . A bad rash all over body  . Dizziness and weakness   Immunizations Administered    Name Date Dose VIS Date Route   Moderna COVID-19 Vaccine 04/08/2020 11:31 AM 0.5 mL 11/2019 Intramuscular   Manufacturer: Moderna   Lot: [id number]   [id number]

## 2020-04-10 ENCOUNTER — Encounter: Payer: Self-pay | Admitting: Oncology

## 2020-04-13 ENCOUNTER — Other Ambulatory Visit: Payer: BC Managed Care – PPO

## 2020-04-14 ENCOUNTER — Other Ambulatory Visit: Payer: Self-pay

## 2020-04-14 ENCOUNTER — Inpatient Hospital Stay: Payer: BC Managed Care – PPO | Attending: Oncology

## 2020-04-14 DIAGNOSIS — Z170 Estrogen receptor positive status [ER+]: Secondary | ICD-10-CM | POA: Insufficient documentation

## 2020-04-14 DIAGNOSIS — I8002 Phlebitis and thrombophlebitis of superficial vessels of left lower extremity: Secondary | ICD-10-CM | POA: Insufficient documentation

## 2020-04-14 DIAGNOSIS — D0512 Intraductal carcinoma in situ of left breast: Secondary | ICD-10-CM | POA: Diagnosis not present

## 2020-04-14 DIAGNOSIS — K219 Gastro-esophageal reflux disease without esophagitis: Secondary | ICD-10-CM | POA: Insufficient documentation

## 2020-04-14 DIAGNOSIS — Z79899 Other long term (current) drug therapy: Secondary | ICD-10-CM | POA: Insufficient documentation

## 2020-04-14 LAB — CBC WITH DIFFERENTIAL/PLATELET
Abs Immature Granulocytes: 0.01 10*3/uL (ref 0.00–0.07)
Basophils Absolute: 0.1 10*3/uL (ref 0.0–0.1)
Basophils Relative: 2 %
Eosinophils Absolute: 0.1 10*3/uL (ref 0.0–0.5)
Eosinophils Relative: 4 %
HCT: 37.4 % (ref 36.0–46.0)
Hemoglobin: 12.2 g/dL (ref 12.0–15.0)
Immature Granulocytes: 0 %
Lymphocytes Relative: 30 %
Lymphs Abs: 1 10*3/uL (ref 0.7–4.0)
MCH: 30 pg (ref 26.0–34.0)
MCHC: 32.6 g/dL (ref 30.0–36.0)
MCV: 91.9 fL (ref 80.0–100.0)
Monocytes Absolute: 0.5 10*3/uL (ref 0.1–1.0)
Monocytes Relative: 13 %
Neutro Abs: 1.8 10*3/uL (ref 1.7–7.7)
Neutrophils Relative %: 51 %
Platelets: 233 10*3/uL (ref 150–400)
RBC: 4.07 MIL/uL (ref 3.87–5.11)
RDW: 12.3 % (ref 11.5–15.5)
WBC: 3.4 10*3/uL — ABNORMAL LOW (ref 4.0–10.5)
nRBC: 0 % (ref 0.0–0.2)

## 2020-04-14 LAB — COMPREHENSIVE METABOLIC PANEL
ALT: 20 U/L (ref 0–44)
AST: 18 U/L (ref 15–41)
Albumin: 3.7 g/dL (ref 3.5–5.0)
Alkaline Phosphatase: 72 U/L (ref 38–126)
Anion gap: 8 (ref 5–15)
BUN: 16 mg/dL (ref 6–20)
CO2: 29 mmol/L (ref 22–32)
Calcium: 8.6 mg/dL — ABNORMAL LOW (ref 8.9–10.3)
Chloride: 104 mmol/L (ref 98–111)
Creatinine, Ser: 0.8 mg/dL (ref 0.44–1.00)
GFR calc Af Amer: >60 mL/min (ref >60)
GFR calc non Af Amer: >60 mL/min (ref >60)
Glucose, Bld: 70 mg/dL (ref 70–99)
Potassium: 3.9 mmol/L (ref 3.5–5.1)
Sodium: 141 mmol/L (ref 135–145)
Total Bilirubin: 0.7 mg/dL (ref 0.3–1.2)
Total Protein: 7 g/dL (ref 6.5–8.1)

## 2020-04-15 LAB — FSH/LH
FSH: 86.7 m[IU]/mL
LH: 43.4 m[IU]/mL

## 2020-04-16 LAB — ESTRADIOL: Estradiol: <5 pg/mL

## 2020-04-17 ENCOUNTER — Other Ambulatory Visit: Payer: Self-pay | Admitting: Oncology

## 2020-04-17 ENCOUNTER — Other Ambulatory Visit: Payer: Self-pay

## 2020-04-17 ENCOUNTER — Telehealth: Payer: Self-pay

## 2020-04-17 DIAGNOSIS — Z79811 Long term (current) use of aromatase inhibitors: Secondary | ICD-10-CM

## 2020-04-17 DIAGNOSIS — D0512 Intraductal carcinoma in situ of left breast: Secondary | ICD-10-CM

## 2020-04-17 MED ORDER — ANASTROZOLE 1 MG PO TABS: 1.0000 mg | ORAL_TABLET | Freq: Every day | ORAL | 0 refills | Status: DC

## 2020-04-17 NOTE — Telephone Encounter (Signed)
Done...  Bone Density has been scheduled as requested. Sched Date:04/23/20   @ 2:20pm No calcium supplements the morning of appt.

## 2020-04-17 NOTE — Telephone Encounter (Signed)
Patient notified

## 2020-04-17 NOTE — Telephone Encounter (Signed)
-----   Message from Earlie Server, MD sent at 04/17/2020  8:14 AM EDT ----- Please let her know that her lab work showed that she is postmenopausal and I recommend patient to be started Arimidex 1 mg daily.  Prescription was sent to pharmacy. Please arrange patient to have baseline DEXA checked.  Follow-up as planned.

## 2020-04-22 ENCOUNTER — Telehealth (INDEPENDENT_AMBULATORY_CARE_PROVIDER_SITE_OTHER): Payer: BC Managed Care – PPO | Admitting: Internal Medicine

## 2020-04-22 ENCOUNTER — Other Ambulatory Visit: Payer: Self-pay

## 2020-04-22 ENCOUNTER — Encounter: Payer: Self-pay | Admitting: Internal Medicine

## 2020-04-22 DIAGNOSIS — D0512 Intraductal carcinoma in situ of left breast: Secondary | ICD-10-CM | POA: Diagnosis not present

## 2020-04-22 DIAGNOSIS — J069 Acute upper respiratory infection, unspecified: Secondary | ICD-10-CM | POA: Diagnosis not present

## 2020-04-22 MED ORDER — AZITHROMYCIN 250 MG PO TABS: ORAL_TABLET | ORAL | 0 refills | Status: DC

## 2020-04-22 NOTE — Progress Notes (Signed)
Patient ID: Rose Bernard, female   DOB: 1966/07/13, 54 y.o.   MRN: [id number]   Virtual Visit via video Note  This visit type was conducted due to national recommendations for restrictions regarding the COVID-19 pandemic (e.g. social distancing).  This format is felt to be most appropriate for this patient at this time.  All issues noted in this document were discussed and addressed.  No physical exam was performed (except for noted visual exam findings with Video Visits).   I connected with Rose Bernard by a video enabled telemedicine application and verified that I am speaking with the correct person using two identifiers. Location patient: home Location provider: work  Persons participating in the virtual visit: patient, provider  The limitations, risks, security and privacy concerns of performing an evaluation and management service by video and the availability of in person appointments have been discussed.  It has also been discussed with the patient that there may be a patient responsible charge related to this service. The patient has expressed understanding and has agreed to proceed.   Reason for visit: work in appt  HPI: Schedule work in appt for sinus congestion and cough.  Symptoms started last week.  Developed a sore throat.  Symptoms progressed.  Increased cough and congestion.  Increased sinus pressure.  Right ear feels full.  Was hurting.  No pain today.  Increased drainage.  No vomiting.  Question of nausea.  Taking equate cold and flu and also thera flu.  Completed XRT 03/30/20.     ROS: See pertinent positives and negatives per HPI.  Past Medical History:  Diagnosis Date  . Chronic cystitis   . Frequent UTI   . GERD (gastroesophageal reflux disease)   . History of kidney stones   . Incomplete bladder emptying   . 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 ABDOMINAL HYSTERECTOMY  1995   2/2 delivery  complications with 3rd child. Hysterectomy and bladder repair with subsequent appendectomy. L Ovary remain in place   . APPENDECTOMY     2001 due to scar tissue  . BLADDER BOTOX  2014, 2015, 2016  . BREAST BIOPSY Bilateral 1991  . BREAST BIOPSY Left 12/12/2019   [place] bx, path pending  . BREAST SURGERY Bilateral 2002   benign mass removed  . COLONOSCOPY  2014  . EXCISION OF BREAST BIOPSY Left 01/10/2020   Procedure: EXCISION OF BREAST BIOPSY w/ Needle localization;  Surgeon: Herbert Pun, MD;  Location: [facility] OR[facility];  Service: General;  Laterality: Left;  . FOOT SURGERY Bilateral   . LAPAROSCOPIC HYSTERECTOMY  1995  . MASTECTOMY, PARTIAL Left 01/31/2020   Procedure: MASTECTOMY PARTIAL;  Surgeon: Herbert Pun, MD;  Location: [facility] OR[facility];  Service: General;  Laterality: Left;  . reconstruction of bladder   1995    2/2 delivery complications with 3rd child     Family History  Problem Relation Age of Onset  . Colon cancer Father        With ostomy in place   . Breast cancer Paternal Aunt 68  . Breast cancer Maternal Aunt   . Breast cancer Maternal Aunt     SOCIAL HX: reviewed.    Current Outpatient Medications:  .  anastrozole (ARIMIDEX) 1 MG tablet, Take 1 tablet (1 mg total) by mouth daily., Disp: 90 tablet, Rfl: 0 .  esomeprazole (NEXIUM) 20 MG capsule, Take 20 mg by mouth daily as needed (acid reflux). , Disp: ,  Rfl:  .  silver sulfADIAZINE (SILVADENE) 1 % cream, Apply 1 application topically 2 (two) times daily. Apply to affected area twice a day., Disp: 50 g, Rfl: 0 .  azithromycin (ZITHROMAX) 250 MG tablet, Take 2 tablets x 1 day and then one tablet per day for four more days., Disp: 6 tablet, Rfl: 0 .  ibuprofen (ADVIL) 600 MG tablet, Take 1 tablet (600 mg total) by mouth every 6 (six) hours as needed., Disp: 30 tablet, Rfl: 0  EXAM:  GENERAL: alert, oriented, appears well and in no acute distress  HEENT: atraumatic, conjunttiva clear, no obvious abnormalities on  inspection of external nose and ears  NECK: normal movements of the head and neck  LUNGS: on inspection no signs of respiratory distress, breathing rate appears normal, no obvious gross SOB, gasping or wheezing  CV: no obvious cyanosis  PSYCH/NEURO: pleasant and cooperative, no obvious depression or anxiety, speech and thought processing grossly intact  ASSESSMENT AND PLAN:  Discussed the following assessment and plan:  URI (upper respiratory infection) Increased sinus pressure, drainage and cough/congestion.  Some ear fullness.  flonase nasal spray as directed. Mucinex/robitussin as directed.  Zpak.  Rest.  Stay hydrated.  Follow.  Notify me if symptoms worsen or do not resolve.    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  Being followed by oncology.  Completed XRT 4/19.  On arimidex.     Meds ordered this encounter  Medications  . azithromycin (ZITHROMAX) 250 MG tablet    Sig: Take 2 tablets x 1 day and then one tablet per day for four more days.    Dispense:  6 tablet    Refill:  0     I discussed the assessment and treatment plan with the patient. The patient was provided an opportunity to ask questions and all were answered. The patient agreed with the plan and demonstrated an understanding of the instructions.   The patient was advised to call back or seek an in-person evaluation if the symptoms worsen or if the condition fails to improve as anticipated.    Einar Pheasant, MD

## 2020-04-23 ENCOUNTER — Ambulatory Visit
Admission: RE | Admit: 2020-04-23 | Discharge: 2020-04-23 | Disposition: A | Discharge status: Home / Self Care | Payer: BC Managed Care – PPO | Source: Ambulatory Visit | Attending: Oncology | Admitting: Oncology

## 2020-04-23 DIAGNOSIS — Z79811 Long term (current) use of aromatase inhibitors: Secondary | ICD-10-CM | POA: Diagnosis not present

## 2020-04-23 DIAGNOSIS — M85852 Other specified disorders of bone density and structure, left thigh: Secondary | ICD-10-CM | POA: Diagnosis not present

## 2020-04-23 DIAGNOSIS — D0512 Intraductal carcinoma in situ of left breast: Secondary | ICD-10-CM | POA: Insufficient documentation

## 2020-04-24 ENCOUNTER — Encounter: Payer: Self-pay | Admitting: Oncology

## 2020-04-25 ENCOUNTER — Emergency Department: Payer: BC Managed Care – PPO

## 2020-04-25 ENCOUNTER — Other Ambulatory Visit: Payer: Self-pay

## 2020-04-25 ENCOUNTER — Emergency Department
Admission: EM | Admit: 2020-04-25 | Discharge: 2020-04-25 | Disposition: A | Discharge status: Home / Self Care | Payer: BC Managed Care – PPO | Attending: Emergency Medicine | Admitting: Emergency Medicine

## 2020-04-25 ENCOUNTER — Encounter: Payer: Self-pay | Admitting: Oncology

## 2020-04-25 DIAGNOSIS — Z853 Personal history of malignant neoplasm of breast: Secondary | ICD-10-CM | POA: Insufficient documentation

## 2020-04-25 DIAGNOSIS — Z79899 Other long term (current) drug therapy: Secondary | ICD-10-CM | POA: Insufficient documentation

## 2020-04-25 DIAGNOSIS — I809 Phlebitis and thrombophlebitis of unspecified site: Secondary | ICD-10-CM

## 2020-04-25 DIAGNOSIS — M79662 Pain in left lower leg: Secondary | ICD-10-CM | POA: Diagnosis not present

## 2020-04-25 DIAGNOSIS — M7989 Other specified soft tissue disorders: Secondary | ICD-10-CM | POA: Diagnosis not present

## 2020-04-25 DIAGNOSIS — R2242 Localized swelling, mass and lump, left lower limb: Secondary | ICD-10-CM | POA: Diagnosis not present

## 2020-04-25 DIAGNOSIS — I8002 Phlebitis and thrombophlebitis of superficial vessels of left lower extremity: Secondary | ICD-10-CM | POA: Insufficient documentation

## 2020-04-25 DIAGNOSIS — M79605 Pain in left leg: Secondary | ICD-10-CM

## 2020-04-25 LAB — COMPREHENSIVE METABOLIC PANEL
ALT: 30 U/L (ref 0–44)
AST: 24 U/L (ref 15–41)
Albumin: 4.2 g/dL (ref 3.5–5.0)
Alkaline Phosphatase: 79 U/L (ref 38–126)
Anion gap: 7 (ref 5–15)
BUN: 17 mg/dL (ref 6–20)
CO2: 29 mmol/L (ref 22–32)
Calcium: 9 mg/dL (ref 8.9–10.3)
Chloride: 105 mmol/L (ref 98–111)
Creatinine, Ser: 0.73 mg/dL (ref 0.44–1.00)
GFR calc Af Amer: >60 mL/min (ref >60)
GFR calc non Af Amer: >60 mL/min (ref >60)
Glucose, Bld: 103 mg/dL — ABNORMAL HIGH (ref 70–99)
Potassium: 4.1 mmol/L (ref 3.5–5.1)
Sodium: 141 mmol/L (ref 135–145)
Total Bilirubin: 0.6 mg/dL (ref 0.3–1.2)
Total Protein: 7.4 g/dL (ref 6.5–8.1)

## 2020-04-25 LAB — CBC WITH DIFFERENTIAL/PLATELET
Abs Immature Granulocytes: 0.01 10*3/uL (ref 0.00–0.07)
Basophils Absolute: 0.1 10*3/uL (ref 0.0–0.1)
Basophils Relative: 2 %
Eosinophils Absolute: 0.2 10*3/uL (ref 0.0–0.5)
Eosinophils Relative: 4 %
HCT: 37.3 % (ref 36.0–46.0)
Hemoglobin: 12.5 g/dL (ref 12.0–15.0)
Immature Granulocytes: 0 %
Lymphocytes Relative: 41 %
Lymphs Abs: 1.7 10*3/uL (ref 0.7–4.0)
MCH: 30.1 pg (ref 26.0–34.0)
MCHC: 33.5 g/dL (ref 30.0–36.0)
MCV: 89.9 fL (ref 80.0–100.0)
Monocytes Absolute: 0.5 10*3/uL (ref 0.1–1.0)
Monocytes Relative: 12 %
Neutro Abs: 1.8 10*3/uL (ref 1.7–7.7)
Neutrophils Relative %: 41 %
Platelets: 295 10*3/uL (ref 150–400)
RBC: 4.15 MIL/uL (ref 3.87–5.11)
RDW: 12.7 % (ref 11.5–15.5)
WBC: 4.3 10*3/uL (ref 4.0–10.5)
nRBC: 0 % (ref 0.0–0.2)

## 2020-04-25 MED ORDER — IBUPROFEN 600 MG PO TABS: 600.0000 mg | ORAL_TABLET | Freq: Four times a day (QID) | ORAL | 0 refills | Status: DC | PRN

## 2020-04-25 MED ORDER — IBUPROFEN 600 MG PO TABS
600.0000 mg | ORAL_TABLET | Freq: Once | ORAL | Status: AC
  Administered 2020-04-25: 600 mg via ORAL
  Filled 2020-04-25: qty 1

## 2020-04-25 NOTE — ED Provider Notes (Signed)
[place] EMERGENCY DEPARTMENT Provider Note   CSN: [id number] Arrival date & time: 04/25/20  1901     History Chief Complaint  Patient presents with  . Leg Swelling    Rose Bernard is a 54 y.o. female hx of breast cancer on hormonal therapy, who presenting with left leg pain.  She states that she was started on estradiol about a week ago for breast cancer.  She started having swelling of the left ankle for the last 5 days.  Patient states that she still has been walking around and denies any history of blood clots.  Denies any chest pain or shortness of breath.  Patient was concerned that she may have a blood clot in the leg since she is on hormone therapy.  The history is provided by the patient.       Past Medical History:  Diagnosis Date  . Chronic cystitis   . Frequent UTI   . GERD (gastroesophageal reflux disease)   . History of kidney stones   . Incomplete bladder emptying   . 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  . Internal nasal lesion 11/24/2019  . Foot pain, right 11/24/2019  . GERD (gastroesophageal reflux disease) 11/24/2019  . Abdominal pain 09/15/2019  . Chest pain 09/15/2019  . Stress 04/23/2018  . Anemia 03/31/2018  . Shingles 01/18/2018  . Gross hematuria 10/24/2017  . Nephrolithiasis 10/24/2017  . Renal colic [id number]  . Left carotid bruit 08/02/2017  . Hot flashes 02/09/2016  . Loss of weight 10/02/2015  . Myofascial pain 09/16/2015  . Chronic female pelvic pain 09/16/2015  . Abdominal pain, left lower quadrant 09/07/2015  . Abnormal mammogram 04/12/2015  . Health care maintenance 04/12/2015  . Sacral pain 12/29/2013  . Urinary frequency 05/15/2013  . Urge incontinence of urine 05/15/2013  . UTI (urinary tract infection) 02/27/2013  . Family history of colon cancer 02/20/2013  . Headache 02/11/2013   . Chronic cystitis 02/11/2013  . History of ovarian cyst 02/11/2013  . Reflux, vesicoureteral 10/15/2012  . Symptoms involving urinary system 10/15/2012  . Mixed incontinence 10/15/2012  . Incomplete bladder emptying 10/15/2012  . Disorder of bladder function 10/15/2012    Past Surgical History:  Procedure Laterality Date  . ABDOMINAL HYSTERECTOMY  1995   2/2 delivery complications with 3rd child. Hysterectomy and bladder repair with subsequent appendectomy. L Ovary remain in place   . APPENDECTOMY     2001 due to scar tissue  . BLADDER BOTOX  2014, 2015, 2016  . BREAST BIOPSY Bilateral 1991  . BREAST BIOPSY Left 12/12/2019   [place] bx, path pending  . BREAST SURGERY Bilateral 2002   benign mass removed  . COLONOSCOPY  2014  . EXCISION OF BREAST BIOPSY Left 01/10/2020   Procedure: EXCISION OF BREAST BIOPSY w/ Needle localization;  Surgeon: Herbert Pun, MD;  Location: [facility] OR[facility];  Service: General;  Laterality: Left;  . FOOT SURGERY Bilateral   . LAPAROSCOPIC HYSTERECTOMY  1995  . MASTECTOMY, PARTIAL Left 01/31/2020   Procedure: MASTECTOMY PARTIAL;  Surgeon: Herbert Pun, MD;  Location: [facility] ORS;  Service: General;  Laterality: Left;  . reconstruction of bladder   1995    2/2 delivery complications with 3rd child      OB History    Gravida  3   Para  2   Term      Preterm  AB      Living  2     SAB      TAB      Ectopic      Multiple      Live Births           Obstetric Comments  One still born 1st Menstrual Cycle:  50  1st Pregnancy:  25         Family History  Problem Relation Age of Onset  . Colon cancer Father        With ostomy in place   . Breast cancer Paternal Aunt 46  . Breast cancer Maternal Aunt   . Breast cancer Maternal Aunt     Social History   Tobacco Use  . Smoking status: Never Smoker  . Smokeless tobacco: Never Used  Substance Use Topics  . Alcohol use: No    Alcohol/week: 0.0 standard drinks  . Drug  use: No    Home Medications Prior to Admission medications   Medication Sig Start Date End Date Taking? Authorizing Provider  anastrozole (ARIMIDEX) 1 MG tablet Take 1 tablet (1 mg total) by mouth daily. 04/17/20   Earlie Server, MD  azithromycin (ZITHROMAX) 250 MG tablet Take 2 tablets x 1 day and then one tablet per day for four more days. 04/22/20   Einar Pheasant, MD  esomeprazole (NEXIUM) 20 MG capsule Take 20 mg by mouth daily as needed (acid reflux).     [provider]  silver sulfADIAZINE (SILVADENE) 1 % cream Apply 1 application topically 2 (two) times daily. Apply to affected area twice a day. 04/06/20   Earlie Server, MD    Allergies    Penicillin v potassium  Review of Systems   Review of Systems  Musculoskeletal:       L leg pain   All other systems reviewed and are negative.   Physical Exam Updated Vital Signs BP (!) 117/57 (BP Location: Right Arm)   Pulse 79   Temp 98.1 F (36.7 C)   Resp 16   Ht 5\' 6"  (1.676 m)   Wt 63.5 kg   LMP 02/07/1994   SpO2 98%   BMI 22.60 kg/m   Physical Exam Vitals and nursing note reviewed.  HENT:     Head: Normocephalic.     Nose: Nose normal.     Mouth/Throat:     Mouth: Mucous membranes are moist.  Eyes:     Extraocular Movements: Extraocular movements intact.     Pupils: Pupils are equal, round, and reactive to light.  Cardiovascular:     Rate and Rhythm: Normal rate and regular rhythm.     Pulses: Normal pulses.     Heart sounds: Normal heart sounds.  Pulmonary:     Effort: Pulmonary effort is normal.     Breath sounds: Normal breath sounds.  Abdominal:     General: Abdomen is flat.     Palpations: Abdomen is soft.  Musculoskeletal:     Cervical back: Normal range of motion.     Comments: L ankle swollen, swelling extending to lower third of the calf.  No upper calf tenderness and no thigh tenderness.  2+ DP pulse.   Skin:    General: Skin is warm.     Capillary Refill: Capillary refill takes less than 2  seconds.  Neurological:     General: No focal deficit present.     Mental Status: She is alert.  Psychiatric:        Mood  and Affect: Mood normal.        Behavior: Behavior normal.     ED Results / Procedures / Treatments   Labs (all labs ordered are listed, but only abnormal results are displayed) Labs Reviewed  COMPREHENSIVE METABOLIC PANEL - Abnormal; Notable for the following components:      Result Value   Glucose, Bld 103 (*)    All other components within normal limits  CBC WITH DIFFERENTIAL/PLATELET    EKG None  Radiology US Venous Img Lower Unilateral Left (DVT)  Result Date: 04/25/2020 CLINICAL DATA:  Left lower extremity pain and swelling for 5 days EXAM: LEFT LOWER EXTREMITY VENOUS DOPPLER ULTRASOUND TECHNIQUE: Gray-scale sonography with compression, as well as color and duplex ultrasound, were performed to evaluate the deep venous system(s) from the level of the common femoral vein through the popliteal and proximal calf veins. COMPARISON:  None. FINDINGS: VENOUS Normal compressibility of the common femoral, superficial femoral, and popliteal veins, as well as the visualized calf veins. Visualized portions of profunda femoral vein and great saphenous vein unremarkable. No filling defects to suggest DVT on grayscale or color Doppler imaging. Doppler waveforms show normal direction of venous flow, normal respiratory plasticity and response to augmentation. Limited views of the contralateral common femoral vein are unremarkable. OTHER There appears to be a occlusion of superficial veins at the medial left ankle however. Limitations: none IMPRESSION: No deep vein thrombosis. However there does appear to be superficial thrombophlebitis involving medial superficial veins overlying the ankle. Electronically Signed   By: Prudencio Pair M.D.   On: 04/25/2020 20:32    Procedures Procedures (including critical care time)  Medications Ordered in ED Medications  ibuprofen (ADVIL)  tablet 600 mg (has no administration in time range)    ED Course  I have reviewed the triage vital signs and the nursing notes.  Pertinent labs & imaging results that were available during my care of the patient were reviewed by me and considered in my medical decision making (see chart for details).    MDM Rules/Calculators/A&P                      RASHELL WOLD is a 54 y.o. female here presenting with left ankle pain and swelling.  She was recently started on estradiol for her breast cancer.  She is at risk for DVT.  She has no chest pain or shortness of breath to suggest PE.  Will get CBC, CMP, left leg DVT study.  9:03 PM Left leg DVT study showed thrombophlebitis.  I think this is likely from the estrogen therapy.  Since the clot is very small and very distal, I think we can try NSAIDs and compression stockings.  I told her that she will need a repeat ultrasound to make sure it is not progressing.  If it is progressing she may need to be on short-term anticoagulation.  Final Clinical Impression(s) / ED Diagnoses Final diagnoses:  Pain and swelling of left lower extremity    Rx / DC Orders ED Discharge Orders    None       Drenda Freeze, MD 04/25/20 2103

## 2020-04-25 NOTE — Discharge Instructions (Signed)
You have a superficial blood clot in the left ankle area that is causing your swelling.  Please take ibuprofen every 6 hours during the day.  Use compression stockings and keep your leg elevated.  You need a repeat ultrasound with your doctor in 3 weeks.  Please mention this to your oncologist as well.  Return to ER if you have worse leg swelling, calf pain, chest pain, shortness of breath.

## 2020-04-25 NOTE — ED Triage Notes (Signed)
Patient c/o Left lower extremity swelling and redness. Patient is a [facility] patient that started hormone therapy on Saturday; reports pain/swelling began Monday.

## 2020-04-27 ENCOUNTER — Telehealth: Payer: Self-pay | Admitting: *Deleted

## 2020-04-27 ENCOUNTER — Telehealth: Payer: Self-pay | Admitting: Internal Medicine

## 2020-04-27 ENCOUNTER — Encounter: Payer: Self-pay | Admitting: Internal Medicine

## 2020-04-27 DIAGNOSIS — J069 Acute upper respiratory infection, unspecified: Secondary | ICD-10-CM | POA: Insufficient documentation

## 2020-04-27 NOTE — Telephone Encounter (Signed)
LC, please schedule patient for MD only this week

## 2020-04-27 NOTE — Telephone Encounter (Signed)
Done...   MD Only appt has been sched as requested  Pt was made aware.

## 2020-04-27 NOTE — Assessment & Plan Note (Signed)
S/p excision.  Being followed by oncology.  Completed XRT 4/19.  On arimidex.

## 2020-04-27 NOTE — Telephone Encounter (Signed)
Patient also sent a MyChart message over the weekend with this question.

## 2020-04-27 NOTE — Telephone Encounter (Signed)
She can hold off Armidex. Treatments. Keep her appt as planned.t hanks.

## 2020-04-27 NOTE — Telephone Encounter (Signed)
Patient called reporting that she wen to ER due to swelling in her leg and was diagnosed with 2 clots in her leg above her ankle that are superficial and she has to return in 3 weeks for a follow up scan. She is asking if she needs to hold her therapy medicine. Please advise

## 2020-04-27 NOTE — Telephone Encounter (Signed)
Call returned to patient and she just wanted to ask about her leg again and tell us that she went to ER and that she had a headache and has been wearing compression stockings and is still having pain. I advised her to follow directions given by ER and that she is to stop her Arimidex for now and to keep her appointment as scheduled for 05/12/20. She asked if she needed to be seen sooner and I told her that Dr Tasia Catchings said to keep her appointment as scheduled. I advised her that if she gets worse to either call us or go back to the ER. She was told by ER to take ibuprofen 600 mg for her pain and I told her to take as needed per their instructions

## 2020-04-27 NOTE — Telephone Encounter (Signed)
Pt called to schedule and in office appt for a ED follow up for a Blood clot. Appt had to be a VV due to her being seen last week for sinus congestion  Pt declined VV and said she would wait to here back from the cancer center

## 2020-04-27 NOTE — Assessment & Plan Note (Signed)
Increased sinus pressure, drainage and cough/congestion.  Some ear fullness.  flonase nasal spray as directed. Mucinex/robitussin as directed.  Zpak.  Rest.  Stay hydrated.  Follow.  Notify me if symptoms worsen or do not resolve.

## 2020-04-27 NOTE — Telephone Encounter (Signed)
Hassan Rowan, I sent her MD response via MyChart and she requested to receive a phone call.  Will you call her to see how we can further help?

## 2020-04-27 NOTE — Telephone Encounter (Signed)
Pt is being seen by oncology

## 2020-04-28 ENCOUNTER — Inpatient Hospital Stay: Payer: BC Managed Care – PPO

## 2020-04-28 ENCOUNTER — Other Ambulatory Visit: Payer: Self-pay

## 2020-04-28 ENCOUNTER — Inpatient Hospital Stay (HOSPITAL_BASED_OUTPATIENT_CLINIC_OR_DEPARTMENT_OTHER): Payer: BC Managed Care – PPO | Admitting: Oncology

## 2020-04-28 ENCOUNTER — Encounter: Payer: Self-pay | Admitting: Oncology

## 2020-04-28 VITALS — BP 115/65 | HR 73 | Temp 96.4°F | Resp 18 | Wt 147.7 lb

## 2020-04-28 DIAGNOSIS — Z79899 Other long term (current) drug therapy: Secondary | ICD-10-CM | POA: Diagnosis not present

## 2020-04-28 DIAGNOSIS — Z8249 Family history of ischemic heart disease and other diseases of the circulatory system: Secondary | ICD-10-CM

## 2020-04-28 DIAGNOSIS — I8002 Phlebitis and thrombophlebitis of superficial vessels of left lower extremity: Secondary | ICD-10-CM | POA: Insufficient documentation

## 2020-04-28 DIAGNOSIS — Z809 Family history of malignant neoplasm, unspecified: Secondary | ICD-10-CM | POA: Diagnosis not present

## 2020-04-28 DIAGNOSIS — D0512 Intraductal carcinoma in situ of left breast: Secondary | ICD-10-CM

## 2020-04-28 DIAGNOSIS — Z170 Estrogen receptor positive status [ER+]: Secondary | ICD-10-CM | POA: Diagnosis not present

## 2020-04-28 DIAGNOSIS — K219 Gastro-esophageal reflux disease without esophagitis: Secondary | ICD-10-CM | POA: Diagnosis not present

## 2020-04-28 MED ORDER — SUCRALFATE 1 G PO TABS
1.0000 g | ORAL_TABLET | Freq: Three times a day (TID) | ORAL | 0 refills | Status: DC
Start: 2020-04-28 — End: 2020-06-23

## 2020-04-28 NOTE — Progress Notes (Signed)
Hematology/Oncology  Follow up note [facility] [phone] [phone]   Patient Care Team: Einar Pheasant, MD as PCP - General (Internal Medicine) Einar Pheasant, MD (Internal Medicine) Bary Castilla, Forest Gleason, MD (General Surgery) Earlie Server, MD as Consulting Physician (Oncology)  REFERRING PROVIDER: Einar Pheasant, MD  CHIEF COMPLAINTS/REASON FOR VISIT:  Follow up for DCIS, superficial thrombophlebitis  HISTORY OF PRESENTING ILLNESS:   Rose Bernard is a  54 y.o.  female with PMH listed below was seen in consultation at the request of  Einar Pheasant, MD  for evaluation of DCIS. 10/15/2019 screening bilateral mammogram showed possible left breast mass and calcification. 10/25/2019 patient had unilateral diagnostic mammogram of left breast which showed indeterminate mass in the left breast at 4:00.  Patient underwent ultrasound-guided biopsy of the left breast mass 12/12/2019 left breast ultrasound-guided biopsy showed atypical ductal hyperplastic. Patient was evaluated by Dr. Peyton Najjar and had left breast mass excisional biopsy on 01/10/2020. Pathology showed DCIS, nuclear grade 1-2, background columnar cell changes, papillary change, fibrocystic changes.  Size of the DCIS 21 mm, margins positive for DCIS in the anterior and inferior multifocal.  Patient was referred to cancer center for discussion of further management. Today she denies any new complaints.  Some soreness at the site of excisional biopsy. Menarche age 40. Patient had a hysterectomy at age of 28. She takes estradiol for hot flash symptoms for about a year half. Family history positive for maternal aunt and paternal aunt were both diagnosed with breast cancer, her father was diagnosed with colon cancer.  She had remote breast biopsy in 1991.    01/10/20 Initial lumpectomy showed positive DCIS margin. 01/31/20 Patient underwent reexcision and achieved a negative margin. Patient  finished adjuvant radiation on 03/30/2020. INTERVAL HISTORY Rose Bernard is a 54 y.o. female who has above history reviewed by me today presents for post ER follow-up/acute visit for superficial thrombophlebitis  Problems and complaints are listed below: Patient started on Arimidex around 04/18/2020 and after taking 2 days, she noticed left ankle swelling for about a week.  She presented to the emergency room on 04/25/2020 for evaluation.There was no DVT., however there does appear to be a superficial thrombophlebitis involving medial superficial veins overlying the ankle. Patient was advised to try NSAIDs and compression stocking. Patient uses ibuprofen every 6 hours as needed.  She uses compression stocking.  She continues to have left lower extremity swelling and discomfort and presents for a post ER follow-up today. She denies any shortness of breath, pleuritic chest pain.  She reports feeling of  fullness, which is in the epigastric area. Patient has a history of acid reflux and currently on Nexium 20 mg daily  Patient has longstanding chronic lower extremity varicose veins.  Denies any personal history of blood clots. Patient does have a positive family history of blood clots.    Review of Systems  Constitutional: Negative for appetite change, chills, fatigue and fever.  HENT:   Negative for hearing loss and voice change.   Eyes: Negative for eye problems.  Respiratory: Negative for chest tightness and cough.   Cardiovascular: Negative for chest pain.  Gastrointestinal: Negative for abdominal distention, abdominal pain and blood in stool.       Epigastric fullness  Endocrine: Negative for hot flashes.  Genitourinary: Negative for difficulty urinating and frequency.   Musculoskeletal: Negative for arthralgias.       Left lower extremity pain.  Skin: Negative for itching and rash.  Neurological: Negative for  extremity weakness.  Hematological: Negative for adenopathy.   Psychiatric/Behavioral: Negative for confusion.    MEDICAL HISTORY:  Past Medical History:  Diagnosis Date  . Chronic cystitis   . Frequent UTI   . GERD (gastroesophageal reflux disease)   . History of kidney stones   . Incomplete bladder emptying   . 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 ABDOMINAL HYSTERECTOMY  1995   2/2 delivery complications with 3rd child. Hysterectomy and bladder repair with subsequent appendectomy. L Ovary remain in place   . APPENDECTOMY     2001 due to scar tissue  . BLADDER BOTOX  2014, 2015, 2016  . BREAST BIOPSY Bilateral 1991  . BREAST BIOPSY Left 12/12/2019   [place] bx, path pending  . BREAST SURGERY Bilateral 2002   benign mass removed  . COLONOSCOPY  2014  . EXCISION OF BREAST BIOPSY Left 01/10/2020   Procedure: EXCISION OF BREAST BIOPSY w/ Needle localization;  Surgeon: Herbert Pun, MD;  Location: [facility] ORS;  Service: General;  Laterality: Left;  . FOOT SURGERY Bilateral   . LAPAROSCOPIC HYSTERECTOMY  1995  . MASTECTOMY, PARTIAL Left 01/31/2020   Procedure: MASTECTOMY PARTIAL;  Surgeon: Herbert Pun, MD;  Location: [facility] ORS;  Service: General;  Laterality: Left;  . reconstruction of bladder   1995    2/2 delivery complications with 3rd child     SOCIAL HISTORY: Social History   Socioeconomic History  . Marital status: Married    Spouse name: Not on file  . Number of children: 2  . Years of education: Not on file  . Highest education level: Not on file  Occupational History  . Not on file  Tobacco Use  . Smoking status: Never Smoker  . Smokeless tobacco: Never Used  Substance and Sexual Activity  . Alcohol use: No    Alcohol/week: 0.0 standard drinks  . Drug use: No  . Sexual activity: Yes    Birth control/protection: Surgical  Other Topics Concern  . Not on file  Social History Narrative  . Not on file   Social  Determinants of Health   Financial Resource Strain:   . Difficulty of Paying Living Expenses:   Food Insecurity:   . Worried About Charity fundraiser in the Last Year:   . Arboriculturist in the Last Year:   Transportation Needs:   . Film/video editor (Medical):   Marland Kitchen Lack of Transportation (Non-Medical):   Physical Activity:   . Days of Exercise per Week:   . Minutes of Exercise per Session:   Stress:   . Feeling of Stress :   Social Connections:   . Frequency of Communication with Friends and Family:   . Frequency of Social Gatherings with Friends and Family:   . Attends Religious Services:   . Active Member of Clubs or Organizations:   . Attends Archivist Meetings:   Marland Kitchen Marital Status:   Intimate Partner Violence:   . Fear of Current or Ex-Partner:   . Emotionally Abused:   Marland Kitchen Physically Abused:   . Sexually Abused:     FAMILY HISTORY: Family History  Problem Relation Age of Onset  . Colon cancer Father        With ostomy in place   . Breast cancer Paternal Aunt 8  . Breast cancer Maternal Aunt   . Breast cancer Maternal Aunt     ALLERGIES:  is  allergic to penicillin v potassium.  MEDICATIONS:  Current Outpatient Medications  Medication Sig Dispense Refill  . esomeprazole (NEXIUM) 20 MG capsule Take 20 mg by mouth daily as needed (acid reflux).     Marland Kitchen ibuprofen (ADVIL) 600 MG tablet Take 1 tablet (600 mg total) by mouth every 6 (six) hours as needed. 30 tablet 0  . silver sulfADIAZINE (SILVADENE) 1 % cream Apply 1 application topically 2 (two) times daily. Apply to affected area twice a day. 50 g 0  . anastrozole (ARIMIDEX) 1 MG tablet Take 1 tablet (1 mg total) by mouth daily. (Patient not taking: Reported on 04/28/2020) 90 tablet 0  . sucralfate (CARAFATE) 1 g tablet Take 1 tablet (1 g total) by mouth in the morning, at noon, and at bedtime for 14 days. 42 tablet 0   No current facility-administered medications for this visit.     PHYSICAL  EXAMINATION: ECOG PERFORMANCE STATUS: 1 - Symptomatic but completely ambulatory Vitals:   04/28/20 0857  BP: 115/65  Pulse: 73  Resp: 18  Temp: (!) 96.4 F (35.8 C)  SpO2: 99%   Filed Weights   04/28/20 0857  Weight: 147 lb 11.2 oz (67 kg)    Physical Exam Constitutional:      General: She is not in acute distress. HENT:     Head: Normocephalic and atraumatic.  Eyes:     General: No scleral icterus. Cardiovascular:     Rate and Rhythm: Normal rate and regular rhythm.     Heart sounds: Normal heart sounds.  Pulmonary:     Effort: Pulmonary effort is normal. No respiratory distress.     Breath sounds: No wheezing.  Abdominal:     General: Bowel sounds are normal. There is no distension.     Palpations: Abdomen is soft.  Musculoskeletal:        General: No deformity. Normal range of motion.     Cervical back: Normal range of motion and neck supple.     Comments: Bilateral lower extremity varicose veins, she wears bilateral compression stocking. 1+ left lower extremity edema  Skin:    General: Skin is warm and dry.     Findings: No erythema or rash.  Neurological:     Mental Status: She is alert and oriented to person, place, and time. Mental status is at baseline.     Cranial Nerves: No cranial nerve deficit.     Coordination: Coordination normal.  Psychiatric:        Mood and Affect: Mood normal.       LABORATORY DATA:  I have reviewed the data as listed Lab Results  Component Value Date   WBC 4.3 04/25/2020   HGB 12.5 04/25/2020   HCT 37.3 04/25/2020   MCV 89.9 04/25/2020   PLT 295 04/25/2020   Recent Labs    04/06/20 0931 04/14/20 1020 04/25/20 1909  NA 142 141 141  K 3.5 3.9 4.1  CL 104 104 105  CO2 27 29 29   GLUCOSE 70 70 103*  BUN 16 16 17   CREATININE 0.75 0.80 0.73  CALCIUM 8.8* 8.6* 9.0  GFRNONAA >60 >60 >60  GFRAA >60 >60 >60  PROT 7.4 7.0 7.4  ALBUMIN 4.0 3.7 4.2  AST 18 18 24   ALT 19 20 30   ALKPHOS 77 72 79  BILITOT 0.7 0.7 0.6    Iron/TIBC/Ferritin/ %Sat    Component Value Date/Time   IRON 94 04/20/2018 1611   TIBC 360 04/20/2018 1611   FERRITIN 11 04/20/2018  Loop 04/20/2018 1611      RADIOGRAPHIC STUDIES: I have personally reviewed the radiological images as listed and agreed with the findings in the report.  US Venous Img Lower Unilateral Left (DVT)  Result Date: 04/25/2020 CLINICAL DATA:  Left lower extremity pain and swelling for 5 days EXAM: LEFT LOWER EXTREMITY VENOUS DOPPLER ULTRASOUND TECHNIQUE: Gray-scale sonography with compression, as well as color and duplex ultrasound, were performed to evaluate the deep venous system(s) from the level of the common femoral vein through the popliteal and proximal calf veins. COMPARISON:  None. FINDINGS: VENOUS Normal compressibility of the common femoral, superficial femoral, and popliteal veins, as well as the visualized calf veins. Visualized portions of profunda femoral vein and great saphenous vein unremarkable. No filling defects to suggest DVT on grayscale or color Doppler imaging. Doppler waveforms show normal direction of venous flow, normal respiratory plasticity and response to augmentation. Limited views of the contralateral common femoral vein are unremarkable. OTHER There appears to be a occlusion of superficial veins at the medial left ankle however. Limitations: none IMPRESSION: No deep vein thrombosis. However there does appear to be superficial thrombophlebitis involving medial superficial veins overlying the ankle. Electronically Signed   By: Prudencio Pair M.D.   On: 04/25/2020 20:32   DG Bone Density  Result Date: 04/23/2020 EXAM: DUAL X-RAY ABSORPTIOMETRY (DXA) FOR BONE MINERAL DENSITY IMPRESSION: Dear Dr. Tasia Catchings, Your patient CESIA SCHEMENAUER completed a FRAX assessment on 04/23/2020 using the [employer] (analysis version: 14.10) manufactured by [employer]. The following summarizes the results of our evaluation. PATIENT  BIOGRAPHICAL: Name: Shanty, Sica Patient ID: [id number] Birth Date: 26-Jan-1966 Height:    66.0 in. Gender:     [age]    [age]       Weight:    146.3 lbs. Ethnicity:  White                            Exam Date: 04/23/2020 FRAX* RESULTS:  (version: 3.5) 10-year Probability of Fracture1 Major Osteoporotic Fracture2 Hip Fracture 7.0% 0.8% Population: [place] (Caucasian) Risk Factors: None Based on Femur (Left) Neck BMD 1 -The 10-year probability of fracture may be lower than reported if the patient has received treatment. 2 -Major Osteoporotic Fracture: Clinical Spine, Forearm, Hip or Shoulder *FRAX is a Materials engineer of the [employer] for Metabolic Bone Disease, a [place] (WHO) [facility]. ASSESSMENT: The probability of a major osteoporotic fracture is 7.0% within the next ten years. The probability of a hip fracture is 0.8% within the next ten years. . Your patient Kenidee Shirrell completed a BMD test on 04/23/2020 using the [employer] (software version: 14.10) manufactured by [employer]. The following summarizes the results of our evaluation. Technologist: [facility] PATIENT BIOGRAPHICAL: Name: Elvita, Robie Patient ID: [id number] Birth Date: 09/24/1966 Height: 66.0 in. Gender: Female Exam Date: 04/23/2020 Weight: 146.3 lbs. Indications: Caucasian, History of Breast Cancer, History of Radiation, Postmenopausal Fractures: Treatments: Arimidex, Calcium, Vitamin D DENSITOMETRY RESULTS: Site      Region    Measured Date Measured Age WHO Classification Young Adult T-score BMD         %Change vs. Previous Significant Change (*) AP Spine L1-L4 04/23/2020 54.2 Normal -0.6 1.118 g/cm2 DualFemur Neck Left 04/23/2020 54.2 Osteopenia -1.9 0.777 g/cm2 ASSESSMENT: The BMD measured at Femur Neck Left is  0.777 g/cm2 with a T-score of -1.9. This patient is considered osteopenic according to [facility]) criteria. The  scan quality is good. World Pharmacologist [facility]) criteria for post-menopausal, Caucasian Women: Normal:                   T-score at or above -1 SD Osteopenia/low bone mass: T-score between -1 and -2.5 SD Osteoporosis:             T-score at or below -2.5 SD RECOMMENDATIONS: 1. All patients should optimize calcium and vitamin D intake. 2. Consider FDA-approved medical therapies in postmenopausal women and men aged 36 years and older, based on the following: a. A hip or vertebral(clinical or morphometric) fracture b. T-score < -2.5 at the femoral neck or spine after appropriate evaluation to exclude secondary causes c. Low bone mass (T-score between -1.0 and -2.5 at the femoral neck or spine) and a 10-year probability of a hip fracture > 3% or a 10-year probability of a major osteoporosis-related fracture > 20% based on the [place]-adapted WHO algorithm 3. Clinician judgment and/or patient preferences may indicate treatment for people with 10-year fracture probabilities above or below these levels FOLLOW-UP: People with diagnosed cases of osteoporosis or at high risk for fracture should have regular bone mineral density tests. For patients eligible for Medicare, routine testing is allowed once every 2 years. The testing frequency can be increased to one year for patients who have rapidly progressing disease, those who are receiving or discontinuing medical therapy to restore bone mass, or have additional risk factors. I have reviewed this report, and agree with the above findings. [facility] Electronically Signed   By: Rolm Baptise M.D.   On: 04/23/2020 19:51      ASSESSMENT & PLAN:  1. Thrombophlebitis of superficial veins of left lower extremity   2. Ductal carcinoma in situ (DCIS) of left breast   3. Family history of thrombosis   4. Family history of cancer    Left DCIS, grade 1-2 Status post lumpectomy and adjuvant radiation. Patient has been started on Arimidex for very short period  of time before she develops thrombophlebitis left lower extremity fissure thrombophlebitis. I discussed with patient that thrombophlebitis is a possible potential side effects from Arimidex, about 2 to 5%. For the time being, I recommend patient to hold Arimidex and to her symptoms fully recover and we will need to have another discussion at that time. Patient has family history of cancer, rationale of genetic testing was recommended and discussed with her.  She declines.  Superficial thrombophlebitis of the left lower extremity,  I discussed with patient that the her SVT is very small and is below knee. I recommend patient to continue utilize NSAIDs, warm and cold compresses, continue compression stockings. Would like to further risk stratify her case.  She does have a family history of blood clots.  I will check factor V Leiden mutation, prothrombin gene mutation, antiphospholipid panel.  In the future we may also check protein C/S antigen and activity level.  I will hold off checking that right now due to the acute setting.  If she does carry certain elevated risk, will consider to start her on anticoagulation for a short period of time.  I recommend to repeat ultrasound in 2 weeks to see if there is any progression of the SVT.  #Epigastric discomfort, patient has a history of acid reflux.  I discussed with patient that epigastric discomfort may be secondary to NSAIDs  use.  I recommend patient to continue Nexium 20 mg daily.  I will add sucralfate 1 g 3 times daily.  She may also alternate Tylenol with NSAIDs.  Patient agrees with the plan.  Orders Placed This Encounter  Procedures  . US Venous Img Lower Unilateral Left    Standing Status:   Future    Standing Expiration Date:   04/28/2021    Order Specific Question:   Reason for Exam (SYMPTOM  OR DIAGNOSIS REQUIRED)    Answer:   f/u image for superficial thrombophlebitis involving medial superficial veins overlying the ankle    Order Specific  Question:   Preferred imaging location?    Answer:   [facility]  . Factor 5 leiden    Standing Status:   Future    Number of Occurrences:   1    Standing Expiration Date:   04/28/2021  . Prothrombin gene mutation    Standing Status:   Future    Number of Occurrences:   1    Standing Expiration Date:   04/28/2021  . ANTIPHOSPHOLIPID SYNDROME PROF    Standing Status:   Future    Number of Occurrences:   1    Standing Expiration Date:   04/28/2021    All questions were answered. The patient knows to call the clinic with any problems questions or concerns.   Return of visit: 2 weeks  Earlie Server, MD, PhD Hematology Oncology [facility] at [facility] Pager- [phone] 04/28/2020

## 2020-04-28 NOTE — Progress Notes (Signed)
Pt complains of pain to left leg, fullness to chest and slight headache Denies shortness of breath. Went to ER Saturday regarding leg pain but was only sent home on Ibuprofen. No new breast problems.

## 2020-04-29 LAB — ANTIPHOSPHOLIPID SYNDROME PROF
Anticardiolipin IgG: <9 GPL U/mL (ref 0–14)
Anticardiolipin IgM: 10 MPL U/mL (ref 0–12)
DRVVT: 24.2 s (ref 0.0–47.0)
PTT Lupus Anticoagulant: 26.6 s (ref 0.0–51.9)

## 2020-05-01 ENCOUNTER — Ambulatory Visit: Payer: BC Managed Care – PPO | Admitting: Oncology

## 2020-05-01 LAB — PROTHROMBIN GENE MUTATION

## 2020-05-01 LAB — FACTOR 5 LEIDEN

## 2020-05-07 ENCOUNTER — Encounter: Payer: BC Managed Care – PPO | Admitting: Licensed Clinical Social Worker

## 2020-05-08 ENCOUNTER — Other Ambulatory Visit: Payer: Self-pay

## 2020-05-08 ENCOUNTER — Ambulatory Visit
Admission: RE | Admit: 2020-05-08 | Discharge: 2020-05-08 | Disposition: A | Discharge status: Home / Self Care | Payer: BC Managed Care – PPO | Source: Ambulatory Visit | Attending: Radiation Oncology | Admitting: Radiation Oncology

## 2020-05-08 ENCOUNTER — Encounter: Payer: Self-pay | Admitting: Radiation Oncology

## 2020-05-08 VITALS — BP 117/59 | HR 72 | Temp 96.9°F | Resp 16 | Wt 147.8 lb

## 2020-05-08 DIAGNOSIS — Z923 Personal history of irradiation: Secondary | ICD-10-CM | POA: Insufficient documentation

## 2020-05-08 DIAGNOSIS — I8002 Phlebitis and thrombophlebitis of superficial vessels of left lower extremity: Secondary | ICD-10-CM | POA: Diagnosis not present

## 2020-05-08 DIAGNOSIS — D0511 Intraductal carcinoma in situ of right breast: Secondary | ICD-10-CM | POA: Diagnosis not present

## 2020-05-08 DIAGNOSIS — Z170 Estrogen receptor positive status [ER+]: Secondary | ICD-10-CM | POA: Insufficient documentation

## 2020-05-08 DIAGNOSIS — D0512 Intraductal carcinoma in situ of left breast: Secondary | ICD-10-CM

## 2020-05-08 NOTE — Progress Notes (Signed)
Radiation Oncology Follow up Note  Name: Rose Bernard   Date:   05/08/2020 MRN:  [id number] DOB: 18-Aug-1966    This 54 y.o. female presents to the clinic today for 1 month follow-up status post whole breast radiation to her left breast for ductal carcinoma in situ ER positive.  REFERRING PROVIDER: Einar Pheasant, MD  HPI: Patient is a 54 year old female now at 1 month having pleated whole breast radiation to her left breast for stage 0 (Tis N0 M0) ductal carcinoma in situ ER positive seen today from a breast standpoint she is doing well she specifically denies breast tenderness cough or bone pain.  Patient has developed superficial thrombophlebitis of the left lower extremity and she is being treated with nonsteroidals. COMPLICATIONS OF TREATMENT: none  FOLLOW UP COMPLIANCE: keeps appointments   PHYSICAL EXAM:  BP (!) 117/59 (BP Location: Right Arm, Patient Position: Sitting, Cuff Size: Normal)   Pulse 72   Temp (!) 96.9 F (36.1 C) (Tympanic)   Resp 16   Wt 147 lb 12.8 oz (67 kg)   LMP 02/07/1994   BMI 23.86 kg/m  Lungs are clear to A&P cardiac examination essentially unremarkable with regular rate and rhythm. No dominant mass or nodularity is noted in either breast in 2 positions examined. Incision is well-healed. No axillary or supraclavicular adenopathy is appreciated. Cosmetic result is excellent.  Well-developed well-nourished patient in NAD. HEENT reveals PERLA, EOMI, discs not visualized.  Oral cavity is clear. No oral mucosal lesions are identified. Neck is clear without evidence of cervical or supraclavicular adenopathy. Lungs are clear to A&P. Cardiac examination is essentially unremarkable with regular rate and rhythm without murmur rub or thrill. Abdomen is benign with no organomegaly or masses noted. Motor sensory and DTR levels are equal and symmetric in the upper and lower extremities. Cranial nerves II through XII are grossly intact. Proprioception is intact. No  peripheral adenopathy or edema is identified. No motor or sensory levels are noted. Crude visual fields are within normal range.  RADIOLOGY RESULTS: No current films to review  PLAN: Present time patient is doing well from breast standpoint.  I am pleased with her overall progress.  Her antiestrogen therapy is on hold secondary to superficial thrombophlebitis that will be followed by medical oncology.  I have asked to see her back in 4 to 5 months for follow-up.  Patient knows to call with any concerns.  I would like to take this opportunity to thank you for allowing me to participate in the care of your patient.Noreene Filbert, MD

## 2020-05-12 ENCOUNTER — Telehealth: Payer: Self-pay | Admitting: Internal Medicine

## 2020-05-12 ENCOUNTER — Encounter: Payer: Self-pay | Admitting: Oncology

## 2020-05-12 ENCOUNTER — Ambulatory Visit (INDEPENDENT_AMBULATORY_CARE_PROVIDER_SITE_OTHER): Payer: BC Managed Care – PPO | Admitting: Physician Assistant

## 2020-05-12 ENCOUNTER — Other Ambulatory Visit: Payer: Self-pay

## 2020-05-12 ENCOUNTER — Inpatient Hospital Stay: Payer: BC Managed Care – PPO

## 2020-05-12 ENCOUNTER — Other Ambulatory Visit: Payer: BC Managed Care – PPO

## 2020-05-12 ENCOUNTER — Ambulatory Visit
Admission: RE | Admit: 2020-05-12 | Discharge: 2020-05-12 | Disposition: A | Discharge status: Home / Self Care | Payer: BC Managed Care – PPO | Source: Ambulatory Visit | Attending: Oncology | Admitting: Oncology

## 2020-05-12 ENCOUNTER — Inpatient Hospital Stay: Payer: BC Managed Care – PPO | Attending: Oncology | Admitting: Oncology

## 2020-05-12 VITALS — BP 122/76 | HR 78 | Ht 66.0 in | Wt 145.0 lb

## 2020-05-12 VITALS — BP 108/71 | HR 70 | Temp 97.1°F | Resp 18 | Wt 147.3 lb

## 2020-05-12 DIAGNOSIS — Z9013 Acquired absence of bilateral breasts and nipples: Secondary | ICD-10-CM | POA: Insufficient documentation

## 2020-05-12 DIAGNOSIS — Z803 Family history of malignant neoplasm of breast: Secondary | ICD-10-CM | POA: Diagnosis not present

## 2020-05-12 DIAGNOSIS — K219 Gastro-esophageal reflux disease without esophagitis: Secondary | ICD-10-CM | POA: Insufficient documentation

## 2020-05-12 DIAGNOSIS — Z79811 Long term (current) use of aromatase inhibitors: Secondary | ICD-10-CM | POA: Diagnosis not present

## 2020-05-12 DIAGNOSIS — N3020 Other chronic cystitis without hematuria: Secondary | ICD-10-CM | POA: Diagnosis not present

## 2020-05-12 DIAGNOSIS — D0512 Intraductal carcinoma in situ of left breast: Secondary | ICD-10-CM | POA: Diagnosis not present

## 2020-05-12 DIAGNOSIS — Z79899 Other long term (current) drug therapy: Secondary | ICD-10-CM | POA: Insufficient documentation

## 2020-05-12 DIAGNOSIS — I8002 Phlebitis and thrombophlebitis of superficial vessels of left lower extremity: Secondary | ICD-10-CM

## 2020-05-12 DIAGNOSIS — N3281 Overactive bladder: Secondary | ICD-10-CM

## 2020-05-12 DIAGNOSIS — Z800 Family history of malignant neoplasm of digestive organs: Secondary | ICD-10-CM | POA: Diagnosis not present

## 2020-05-12 DIAGNOSIS — Z8249 Family history of ischemic heart disease and other diseases of the circulatory system: Secondary | ICD-10-CM | POA: Diagnosis not present

## 2020-05-12 DIAGNOSIS — M8580 Other specified disorders of bone density and structure, unspecified site: Secondary | ICD-10-CM

## 2020-05-12 LAB — URINALYSIS, COMPLETE
Bilirubin, UA: NEGATIVE
Glucose, UA: NEGATIVE
Ketones, UA: NEGATIVE
Nitrite, UA: NEGATIVE
Protein,UA: NEGATIVE
Specific Gravity, UA: 1.02 (ref 1.005–1.030)
Urobilinogen, Ur: 0.2 mg/dL (ref 0.2–1.0)
pH, UA: 7 (ref 5.0–7.5)

## 2020-05-12 LAB — MICROSCOPIC EXAMINATION

## 2020-05-12 MED ORDER — SULFAMETHOXAZOLE-TRIMETHOPRIM 800-160 MG PO TABS: 1.0000 | ORAL_TABLET | Freq: Two times a day (BID) | ORAL | 0 refills | Status: AC

## 2020-05-12 NOTE — Telephone Encounter (Signed)
Pt would like to have appt with provider listed below.

## 2020-05-12 NOTE — Progress Notes (Signed)
05/12/2020 9:20 AM   [name] August 23, 1966 [id number]  CC: Chief Complaint  Patient presents with  . Over Active Bladder    HPI: Rose Bernard is a 54 y.o. 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  Last Botox administration with Dr. Erlene Quan on 11/13/2019.  Today, patient reports continued symptomatic relief with intravesical Botox.  She states she started to develop urgency, frequency, and urge incontinence approximately 1 month ago without dysuria.  She also reports a 1 day history of low back spasms that she states is consistent with her symptoms when she is due for another Botox injection.  She would like to proceed with another treatment at this time.  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  Additionally, she developed LLE thrombophlebitis last week and is expected to undergo repeat ultrasound today.  In-office UA today positive for trace-intact blood and trace leukocyte esterase; urine microscopy with 11-30 WBCs/HPF, granular casts, and many bacteria.   PMH: Past Medical History:  Diagnosis Date  . Chronic cystitis   . Frequent UTI   . GERD (gastroesophageal reflux disease)   . History of kidney stones   . Incomplete bladder emptying   . 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 ABDOMINAL HYSTERECTOMY  1995   2/2 delivery complications with 3rd child. Hysterectomy and bladder repair with subsequent appendectomy. L Ovary remain in place   . APPENDECTOMY     2001 due to scar tissue  . BLADDER BOTOX  2014, 2015, 2016  . BREAST BIOPSY Bilateral 1991  . BREAST BIOPSY Left 12/12/2019   [place] bx, path pending  . BREAST SURGERY Bilateral 2002   benign mass removed   . COLONOSCOPY  2014  . EXCISION OF BREAST BIOPSY Left 01/10/2020   Procedure: EXCISION OF BREAST BIOPSY w/ Needle localization;  Surgeon: Herbert Pun, MD;  Location: [facility];  Service: General;  Laterality: Left;  . FOOT SURGERY Bilateral   . LAPAROSCOPIC HYSTERECTOMY  1995  . MASTECTOMY, PARTIAL Left 01/31/2020   Procedure: MASTECTOMY PARTIAL;  Surgeon: Herbert Pun, MD;  Location: [facility];  Service: General;  Laterality: Left;  . reconstruction of bladder   1995    2/2 delivery complications with 3rd child     Home Medications:  Allergies as of 05/12/2020      Reactions   Penicillin V Potassium Rash   Did it involve swelling of the face/tongue/throat, SOB, or low BP? No Did it involve sudden or severe rash/hives, skin peeling, or any reaction on the inside of your mouth or nose? No Did you need to seek medical attention at a hospital or doctor's office? No When did it last happen?25 years ago If all above answers are "NO", may proceed with cephalosporin use.      Medication List       Accurate as of May 12, 2020  9:20 AM. If you have any questions, ask your nurse or doctor.        anastrozole 1 MG tablet Commonly known as: ARIMIDEX Take 1 tablet (1 mg total) by mouth daily.   esomeprazole 20 MG capsule Commonly known as: NEXIUM Take 20 mg by mouth daily as needed (acid reflux).   ibuprofen 600 MG tablet Commonly known as: ADVIL Take 1  tablet (600 mg total) by mouth every 6 (six) hours as needed.   silver sulfADIAZINE 1 % cream Commonly known as: SILVADENE Apply 1 application topically 2 (two) times daily. Apply to affected area twice a day.   sucralfate 1 g tablet Commonly known as: Carafate Take 1 tablet (1 g total) by mouth in the morning, at noon, and at bedtime for 14 days.       Allergies:  Allergies  Allergen Reactions  . Penicillin V Potassium Rash    Did it involve swelling of the face/tongue/throat, SOB, or low BP? No Did it  involve sudden or severe rash/hives, skin peeling, or any reaction on the inside of your mouth or nose? No Did you need to seek medical attention at a hospital or doctor's office? No When did it last happen?25 years ago If all above answers are "NO", may proceed with cephalosporin use.     Family History: Family History  Problem Relation Age of Onset  . Colon cancer Father        With ostomy in place   . Breast cancer Paternal Aunt 58  . Breast cancer Maternal Aunt   . Breast cancer Maternal Aunt     Social History:   reports that she has never smoked. She has never used smokeless tobacco. She reports that she does not drink alcohol or use drugs.  Physical Exam: BP 122/76 (BP Location: Left Arm, Patient Position: Sitting, Cuff Size: Normal)   Pulse 78   Ht 5\' 6"  (1.676 m)   Wt 145 lb (65.8 kg)   LMP 02/07/1994   BMI 23.40 kg/m   Constitutional:  Alert and oriented, no acute distress, nontoxic appearing HEENT: [place], AT Cardiovascular: No clubbing, cyanosis, or edema Respiratory: Normal respiratory effort, no increased work of breathing Skin: No rashes, bruises or suspicious lesions Neurologic: Grossly intact, no focal deficits, moving all 4 extremities Psychiatric: Normal mood and affect  Laboratory Data: Results for orders placed or performed in visit on 05/12/20  Microscopic Examination   URINE  Result Value Ref Range   WBC, UA 11-30 (A) 0 - 5 /hpf   RBC 0-2 0 - 2 /hpf   Epithelial Cells (non renal) 0-10 0 - 10 /hpf   Casts Present (A) None seen /lpf   Cast Type Granular casts (A) N/A   Crystals Present (A) N/A   Crystal Type Amorphous Sediment N/A   Bacteria, UA Many (A) None seen/Few  Urinalysis, Complete  Result Value Ref Range   Specific Gravity, UA 1.020 1.005 - 1.030   pH, UA 7.0 5.0 - 7.5   Color, UA Straw Yellow   Appearance Ur Cloudy (A) Clear   Leukocytes,UA Trace (A) Negative   Protein,UA Negative Negative/Trace   Glucose, UA Negative Negative     Ketones, UA Negative Negative   RBC, UA Trace (A) Negative   Bilirubin, UA Negative Negative   Urobilinogen, Ur 0.2 0.2 - 1.0 mg/dL   Nitrite, UA Negative Negative   Microscopic Examination See below:    Assessment & Plan:   1. OAB (overactive bladder) 54 year old female with PMH refractory OAB managed with intravesical Botox presents for follow-up 6 months after her most recent treatment.  She reports she feels she is ready for her next injection.  UA today notable for pyuria and bacteriuria.  We will start her on empiric Bactrim and send for culture for further evaluation.  We will schedule for Botox administration thereafter. - Urinalysis, Complete - CULTURE, URINE COMPREHENSIVE -  sulfamethoxazole-trimethoprim (BACTRIM DS) 800-160 MG tablet; Take 1 tablet by mouth 2 (two) times daily for 5 days.  Dispense: 10 tablet; Refill: 0 - Microscopic Examination   Return in 2 weeks (on 05/26/2020) for Intravesical Botox with Dr. Erlene Quan.  Debroah Loop, PA-C  [facility] [address], [place], [place] [address] [phone]

## 2020-05-12 NOTE — Telephone Encounter (Signed)
Pt would like to have a referral sent to [facility] in [facility] she wants to change from [place]

## 2020-05-12 NOTE — Progress Notes (Signed)
Hematology/Oncology  Follow up note [facility] [phone] [phone]   Patient Care Team: Einar Pheasant, MD as PCP - General (Internal Medicine) Einar Pheasant, MD (Internal Medicine) Bary Castilla, Forest Gleason, MD (General Surgery) Earlie Server, MD as Consulting Physician (Oncology)  REFERRING PROVIDER: Einar Pheasant, MD  CHIEF COMPLAINTS/REASON FOR VISIT:  Follow up for DCIS, superficial thrombophlebitis  HISTORY OF PRESENTING ILLNESS:   Rose Bernard is a  54 y.o.  female with PMH listed below was seen in consultation at the request of  Einar Pheasant, MD  for evaluation of DCIS. 10/15/2019 screening bilateral mammogram showed possible left breast mass and calcification. 10/25/2019 patient had unilateral diagnostic mammogram of left breast which showed indeterminate mass in the left breast at 4:00.  Patient underwent ultrasound-guided biopsy of the left breast mass 12/12/2019 left breast ultrasound-guided biopsy showed atypical ductal hyperplastic. Patient was evaluated by Dr. Peyton Najjar and had left breast mass excisional biopsy on 01/10/2020. Pathology showed DCIS, nuclear grade 1-2, background columnar cell changes, papillary change, fibrocystic changes.  Size of the DCIS 21 mm, margins positive for DCIS in the anterior and inferior multifocal.  Patient was referred to cancer center for discussion of further management. Today she denies any new complaints.  Some soreness at the site of excisional biopsy. Menarche age 73. Patient had a hysterectomy at age of 21. She takes estradiol for hot flash symptoms for about a year half. Family history positive for maternal aunt and paternal aunt were both diagnosed with breast cancer, her father was diagnosed with colon cancer.  She had remote breast biopsy in 1991.    01/10/20 Initial lumpectomy showed positive DCIS margin. 01/31/20 Patient underwent reexcision and achieved a negative margin. Patient  finished adjuvant radiation on 03/30/2020.  # Patient started on Arimidex around 04/18/2020 and after taking 2 days, she noticed left ankle swelling for about a week.  She presented to the emergency room on 04/25/2020 for evaluation.There was no DVT., however there does appear to be a superficial thrombophlebitis involving medial superficial veins overlying the ankle. Patient was advised to try NSAIDs and compression stocking. Patient uses ibuprofen every 6 hours as needed.  She uses compression stocking.  She continues to have left lower extremity swelling and discomfort and presents for a post ER follow-up today. She denies any shortness of breath, pleuritic chest pain.  She reports feeling of  fullness, which is in the epigastric area. Patient has a history of acid reflux and currently on Nexium 20 mg daily   INTERVAL HISTORY Rose Bernard is a 54 y.o. female who has above history reviewed by me today presentswith for follow-up of SVT and history of DCIS Problems and complaints are listed below: Patient takes NSAIDs as needed for swelling and pain.  She reports that her symptoms have improved, although she still has some soreness around her left heel area.  She uses compression stocking. She has been off Arimidex since mid May due to the concern of aromatase inhibitor may have increased her risk of blood clots. She also had an ultrasound of left lower extremity done recently for monitoring the status of SVT.    Review of Systems  Constitutional: Negative for appetite change, chills, fatigue and fever.  HENT:   Negative for hearing loss and voice change.   Eyes: Negative for eye problems.  Respiratory: Negative for chest tightness and cough.   Cardiovascular: Negative for chest pain.  Gastrointestinal: Negative for abdominal distention, abdominal pain and blood in stool.  Endocrine: Negative for hot flashes.  Genitourinary: Negative for difficulty urinating and frequency.     Musculoskeletal: Negative for arthralgias.       Left lower extremity soreness has improved.  Skin: Negative for itching and rash.  Neurological: Negative for extremity weakness.  Hematological: Negative for adenopathy.  Psychiatric/Behavioral: Negative for confusion.    MEDICAL HISTORY:  Past Medical History:  Diagnosis Date  . Chronic cystitis   . Frequent UTI   . GERD (gastroesophageal reflux disease)   . History of kidney stones   . Incomplete bladder emptying   . 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 ABDOMINAL HYSTERECTOMY  1995   2/2 delivery complications with 3rd child. Hysterectomy and bladder repair with subsequent appendectomy. L Ovary remain in place   . APPENDECTOMY     2001 due to scar tissue  . BLADDER BOTOX  2014, 2015, 2016  . BREAST BIOPSY Bilateral 1991  . BREAST BIOPSY Left 12/12/2019   [place] bx, path pending  . BREAST SURGERY Bilateral 2002   benign mass removed  . COLONOSCOPY  2014  . EXCISION OF BREAST BIOPSY Left 01/10/2020   Procedure: EXCISION OF BREAST BIOPSY w/ Needle localization;  Surgeon: Herbert Pun, MD;  Location: [facility] ORS;  Service: General;  Laterality: Left;  . FOOT SURGERY Bilateral   . LAPAROSCOPIC HYSTERECTOMY  1995  . MASTECTOMY, PARTIAL Left 01/31/2020   Procedure: MASTECTOMY PARTIAL;  Surgeon: Herbert Pun, MD;  Location: [facility] ORS;  Service: General;  Laterality: Left;  . reconstruction of bladder   1995    2/2 delivery complications with 3rd child     SOCIAL HISTORY: Social History   Socioeconomic History  . Marital status: Married    Spouse name: Not on file  . Number of children: 2  . Years of education: Not on file  . Highest education level: Not on file  Occupational History  . Not on file  Tobacco Use  . Smoking status: Never Smoker  . Smokeless tobacco: Never Used  Substance and Sexual Activity  . Alcohol use:  No    Alcohol/week: 0.0 standard drinks  . Drug use: No  . Sexual activity: Yes    Birth control/protection: Surgical  Other Topics Concern  . Not on file  Social History Narrative  . Not on file   Social Determinants of Health   Financial Resource Strain:   . Difficulty of Paying Living Expenses:   Food Insecurity:   . Worried About Charity fundraiser in the Last Year:   . Arboriculturist in the Last Year:   Transportation Needs:   . Film/video editor (Medical):   Marland Kitchen Lack of Transportation (Non-Medical):   Physical Activity:   . Days of Exercise per Week:   . Minutes of Exercise per Session:   Stress:   . Feeling of Stress :   Social Connections:   . Frequency of Communication with Friends and Family:   . Frequency of Social Gatherings with Friends and Family:   . Attends Religious Services:   . Active Member of Clubs or Organizations:   . Attends Archivist Meetings:   Marland Kitchen Marital Status:   Intimate Partner Violence:   . Fear of Current or Ex-Partner:   . Emotionally Abused:   Marland Kitchen Physically Abused:   . Sexually Abused:     FAMILY HISTORY: Family History  Problem Relation Age of Onset  .  Colon cancer Father        With ostomy in place   . Breast cancer Paternal Aunt 51  . Breast cancer Maternal Aunt   . Breast cancer Maternal Aunt     ALLERGIES:  is allergic to penicillin v potassium.  MEDICATIONS:  Current Outpatient Medications  Medication Sig Dispense Refill  . esomeprazole (NEXIUM) 20 MG capsule Take 20 mg by mouth daily as needed (acid reflux).     Marland Kitchen ibuprofen (ADVIL) 600 MG tablet Take 1 tablet (600 mg total) by mouth every 6 (six) hours as needed. 30 tablet 0  . sucralfate (CARAFATE) 1 g tablet Take 1 tablet (1 g total) by mouth in the morning, at noon, and at bedtime for 14 days. 42 tablet 0  . sulfamethoxazole-trimethoprim (BACTRIM DS) 800-160 MG tablet Take 1 tablet by mouth 2 (two) times daily for 5 days. 10 tablet 0  . anastrozole  (ARIMIDEX) 1 MG tablet Take 1 tablet (1 mg total) by mouth daily. (Patient not taking: Reported on 04/28/2020) 90 tablet 0  . silver sulfADIAZINE (SILVADENE) 1 % cream Apply 1 application topically 2 (two) times daily. Apply to affected area twice a day. (Patient not taking: Reported on 05/12/2020) 50 g 0   No current facility-administered medications for this visit.     PHYSICAL EXAMINATION: ECOG PERFORMANCE STATUS: 1 - Symptomatic but completely ambulatory Vitals:   05/12/20 1348  BP: 108/71  Pulse: 70  Resp: 18  Temp: (!) 97.1 F (36.2 C)   Filed Weights   05/12/20 1348  Weight: 147 lb 4.8 oz (66.8 kg)    Physical Exam Constitutional:      General: She is not in acute distress. HENT:     Head: Normocephalic and atraumatic.  Eyes:     General: No scleral icterus. Cardiovascular:     Rate and Rhythm: Normal rate and regular rhythm.     Heart sounds: Normal heart sounds.  Pulmonary:     Effort: Pulmonary effort is normal. No respiratory distress.     Breath sounds: No wheezing.  Abdominal:     General: Bowel sounds are normal. There is no distension.     Palpations: Abdomen is soft.  Musculoskeletal:        General: No deformity. Normal range of motion.     Cervical back: Normal range of motion and neck supple.     Comments: Bilateral lower extremity varicose veins, she wears bilateral compression stocking. 1+ left lower extremity edema  Skin:    General: Skin is warm and dry.     Findings: No erythema or rash.  Neurological:     Mental Status: She is alert and oriented to person, place, and time. Mental status is at baseline.     Cranial Nerves: No cranial nerve deficit.     Coordination: Coordination normal.  Psychiatric:        Mood and Affect: Mood normal.       LABORATORY DATA:  I have reviewed the data as listed Lab Results  Component Value Date   WBC 4.3 04/25/2020   HGB 12.5 04/25/2020   HCT 37.3 04/25/2020   MCV 89.9 04/25/2020   PLT 295  04/25/2020   Recent Labs    04/06/20 0931 04/14/20 1020 04/25/20 1909  NA 142 141 141  K 3.5 3.9 4.1  CL 104 104 105  CO2 27 29 29   GLUCOSE 70 70 103*  BUN 16 16 17   CREATININE 0.75 0.80 0.73  CALCIUM 8.8* 8.6*  9.0  GFRNONAA >60 >60 >60  GFRAA >60 >60 >60  PROT 7.4 7.0 7.4  ALBUMIN 4.0 3.7 4.2  AST 18 18 24   ALT 19 20 30   ALKPHOS 77 72 79  BILITOT 0.7 0.7 0.6   Iron/TIBC/Ferritin/ %Sat    Component Value Date/Time   IRON 94 04/20/2018 1611   TIBC 360 04/20/2018 1611   FERRITIN 11 04/20/2018 1611   IRONPCTSAT 26 04/20/2018 1611      RADIOGRAPHIC STUDIES: I have personally reviewed the radiological images as listed and agreed with the findings in the report.  US Venous Img Lower Unilateral Left  Result Date: 05/12/2020 CLINICAL DATA:  Superficial thrombophlebitis of the ankle veins EXAM: LEFT LOWER EXTREMITY VENOUS DOPPLER ULTRASOUND TECHNIQUE: Gray-scale sonography with graded compression, as well as color Doppler and duplex ultrasound were performed to evaluate the lower extremity deep venous systems from the level of the common femoral vein and including the common femoral, femoral, profunda femoral, popliteal and calf veins including the posterior tibial, peroneal and gastrocnemius veins when visible. The superficial great saphenous vein was also interrogated. Spectral Doppler was utilized to evaluate flow at rest and with distal augmentation maneuvers in the common femoral, femoral and popliteal veins. COMPARISON:  None. FINDINGS: Contralateral Common Femoral Vein: Respiratory phasicity is normal and symmetric with the symptomatic side. No evidence of thrombus. Normal compressibility. Common Femoral Vein: No evidence of thrombus. Normal compressibility, respiratory phasicity and response to augmentation. Saphenofemoral Junction: No evidence of thrombus. Normal compressibility and flow on color Doppler imaging. Profunda Femoral Vein: No evidence of thrombus. Normal  compressibility and flow on color Doppler imaging. Femoral Vein: No evidence of thrombus. Normal compressibility, respiratory phasicity and response to augmentation. Popliteal Vein: No evidence of thrombus. Normal compressibility, respiratory phasicity and response to augmentation. Calf Veins: No evidence of thrombus. Normal compressibility and flow on color Doppler imaging. Other Findings:  No other significant finding by ultrasound. IMPRESSION: No evidence of deep venous thrombosis. Electronically Signed   By: Jerilynn Mages.  Shick M.D.   On: 05/12/2020 11:43   US Venous Img Lower Unilateral Left (DVT)  Result Date: 04/25/2020 CLINICAL DATA:  Left lower extremity pain and swelling for 5 days EXAM: LEFT LOWER EXTREMITY VENOUS DOPPLER ULTRASOUND TECHNIQUE: Gray-scale sonography with compression, as well as color and duplex ultrasound, were performed to evaluate the deep venous system(s) from the level of the common femoral vein through the popliteal and proximal calf veins. COMPARISON:  None. FINDINGS: VENOUS Normal compressibility of the common femoral, superficial femoral, and popliteal veins, as well as the visualized calf veins. Visualized portions of profunda femoral vein and great saphenous vein unremarkable. No filling defects to suggest DVT on grayscale or color Doppler imaging. Doppler waveforms show normal direction of venous flow, normal respiratory plasticity and response to augmentation. Limited views of the contralateral common femoral vein are unremarkable. OTHER There appears to be a occlusion of superficial veins at the medial left ankle however. Limitations: none IMPRESSION: No deep vein thrombosis. However there does appear to be superficial thrombophlebitis involving medial superficial veins overlying the ankle. Electronically Signed   By: Prudencio Pair M.D.   On: 04/25/2020 20:32   DG Bone Density  Result Date: 04/23/2020 EXAM: DUAL X-RAY ABSORPTIOMETRY (DXA) FOR BONE MINERAL DENSITY IMPRESSION: Dear  Dr. Tasia Catchings, Your patient ELVE DOHRMAN completed a FRAX assessment on 04/23/2020 using the [place] (analysis version: 14.10) manufactured by [employer]. The following summarizes the results of our evaluation. PATIENT BIOGRAPHICAL: Name: Klomp,  Emmani Chadha Patient ID: [id number] Birth Date: Sep 18, 1966 Height:    66.0 in. Gender:     Female    [age]       Weight:    146.3 lbs. Ethnicity:  White                            Exam Date: 04/23/2020 FRAX* RESULTS:  (version: 3.5) 10-year Probability of Fracture1 Major Osteoporotic Fracture2 Hip Fracture 7.0% 0.8% Population: [place] (Caucasian) Risk Factors: None Based on Femur (Left) Neck BMD 1 -The 10-year probability of fracture may be lower than reported if the patient has received treatment. 2 -Major Osteoporotic Fracture: Clinical Spine, Forearm, Hip or Shoulder *FRAX is a Materials engineer of the [employer] for Metabolic Bone Disease, a [place] (WHO) [facility]. ASSESSMENT: The probability of a major osteoporotic fracture is 7.0% within the next ten years. The probability of a hip fracture is 0.8% within the next ten years. . Your patient Millian Vestal completed a BMD test on 04/23/2020 using the [place] (software version: 14.10) manufactured by [employer]. The following summarizes the results of our evaluation. Technologist: [facility] PATIENT BIOGRAPHICAL: Name: Lavonn, Papka Patient ID: [id number] Birth Date: 10-23-1966 Height: 66.0 in. Gender: Female Exam Date: 04/23/2020 Weight: 146.3 lbs. Indications: Caucasian, History of Breast Cancer, History of Radiation, Postmenopausal Fractures: Treatments: Arimidex, Calcium, Vitamin D DENSITOMETRY RESULTS: Site      Region    Measured Date Measured Age WHO Classification Young Adult T-score BMD         %Change vs. Previous Significant Change (*) AP Spine L1-L4 04/23/2020 54.2 Normal -0.6 1.118 g/cm2 DualFemur Neck  Left 04/23/2020 54.2 Osteopenia -1.9 0.777 g/cm2 ASSESSMENT: The BMD measured at Femur Neck Left is 0.777 g/cm2 with a T-score of -1.9. This patient is considered osteopenic according to [facility]) criteria. The scan quality is good. World Pharmacologist [facility]) criteria for post-menopausal, Caucasian Women: Normal:                   T-score at or above -1 SD Osteopenia/low bone mass: T-score between -1 and -2.5 SD Osteoporosis:             T-score at or below -2.5 SD RECOMMENDATIONS: 1. All patients should optimize calcium and vitamin D intake. 2. Consider FDA-approved medical therapies in postmenopausal women and men aged 4 years and older, based on the following: a. A hip or vertebral(clinical or morphometric) fracture b. T-score < -2.5 at the femoral neck or spine after appropriate evaluation to exclude secondary causes c. Low bone mass (T-score between -1.0 and -2.5 at the femoral neck or spine) and a 10-year probability of a hip fracture > 3% or a 10-year probability of a major osteoporosis-related fracture > 20% based on the [place]-adapted WHO algorithm 3. Clinician judgment and/or patient preferences may indicate treatment for people with 10-year fracture probabilities above or below these levels FOLLOW-UP: People with diagnosed cases of osteoporosis or at high risk for fracture should have regular bone mineral density tests. For patients eligible for Medicare, routine testing is allowed once every 2 years. The testing frequency can be increased to one year for patients who have rapidly progressing disease, those who are receiving or discontinuing medical therapy to restore bone mass, or have additional risk factors. I have reviewed this report, and agree with the  above findings. [facility] Electronically Signed   By: Rolm Baptise M.D.   On: 04/23/2020 19:51      ASSESSMENT & PLAN:  1. Thrombophlebitis of superficial veins of left lower extremity   2. Ductal  carcinoma in situ (DCIS) of left breast   3. Family history of thrombosis   4. Osteopenia, unspecified location    # Superficial thrombophlebitis of the left lower extremity, Repeat ultrasound images were reviewed and discussed with patient.  No propagation of SVT to DVT.  Her symptoms seems to have improved as well.  Recommend patient to continue to use NSAIDs as needed for her symptoms. Patient has longstanding bilateral lower extremity varicose which increases her risk of thrombosis. Recommend patient to establish care with vascular surgery for further evaluation and management for her varicose vein.  Continue compression stocking.  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 Patient has been off Arimidex due to the concern of Arimidex may increase thrombosis risk-1 to 2% chance. I had a lengthy discussion with patient that comparing to tamoxifen, Arimidex has much lower probability of causing blood clots.  I think versus SVT is more likely secondary to longstanding varicose veins rather than due to Arimidex. I discussed with her about the rationale of Arimidex for DCIS treatments I recommend patient to resume Arimidex after she is seen by vascular surgeon.  #Family history of thrombosis.  She had hypercoagulable work-up done including negative antiphospholipid antibody profile, negative factor V Leiden gene mutation, negative prothrombin gene mutation. #Osteopenia, bone density results were discussed with patient.  Patient has osteopenia.  In the context of chronic aromatase inhibitor use, recommend patient to obtain dental clearance for bisphosphonate treatments.  Patient informs me that she has had multiple dental work done and has a Secretary/administrator for future dental work.  We will hold off bisphosphonate treatment as of now.  We will discuss further at the next visit. Orders Placed This Encounter  Procedures  . Ambulatory referral to Vascular Surgery    Referral Priority:    Routine    Referral Type:   Surgical    Referral Reason:   Specialty Services Required    Referred to Provider:   Algernon Huxley, MD    Requested Specialty:   Vascular Surgery    Number of Visits Requested:   1    All questions were answered. The patient knows to call the clinic with any problems questions or concerns.   Return of visit:6  weeks  Earlie Server, MD, PhD Hematology Oncology [facility] Pager- [phone] 05/12/2020

## 2020-05-12 NOTE — Progress Notes (Signed)
Patient here for follow up. Reports left leg pain 4/10. Pt currently on anabiotic for kidney infection.

## 2020-05-12 NOTE — Telephone Encounter (Signed)
Pt called back with name of person she wants to see Leontine Locket

## 2020-05-13 ENCOUNTER — Telehealth: Payer: Self-pay | Admitting: Internal Medicine

## 2020-05-13 NOTE — Addendum Note (Signed)
Addended by: Alisa Graff on: 05/13/2020 05:06 AM   Modules accepted: Orders

## 2020-05-13 NOTE — Telephone Encounter (Signed)
Order placed for referral to oncology

## 2020-05-13 NOTE — Telephone Encounter (Signed)
Mychart sent.

## 2020-05-13 NOTE — Telephone Encounter (Signed)
Order placed.  Someone should be contacting her with an appt date and time.

## 2020-05-13 NOTE — Telephone Encounter (Signed)
Left msg for pt to call ofc to verify name of the specialist pt wants to see.

## 2020-05-14 ENCOUNTER — Ambulatory Visit: Payer: BC Managed Care – PPO | Admitting: Physician Assistant

## 2020-05-14 LAB — CULTURE, URINE COMPREHENSIVE

## 2020-05-15 ENCOUNTER — Other Ambulatory Visit: Payer: BC Managed Care – PPO

## 2020-05-15 ENCOUNTER — Ambulatory Visit: Payer: BC Managed Care – PPO | Admitting: Oncology

## 2020-05-19 ENCOUNTER — Telehealth: Payer: Self-pay | Admitting: Urology

## 2020-05-19 NOTE — Telephone Encounter (Signed)
No Pre-Auth required for procedure-will call back regarding if auth required for botox

## 2020-05-19 NOTE — Telephone Encounter (Signed)
Based on Sam's last note:  Return in 2 weeks (on 05/26/2020) for Intravesical Botox with Dr. Erlene Quan.  Please check the prior authorization with the patient's insurance for botox.  Once approved, patient needs to be scheduled.

## 2020-05-20 ENCOUNTER — Telehealth: Payer: Self-pay | Admitting: *Deleted

## 2020-05-20 NOTE — Telephone Encounter (Signed)
Per pt request to cx 06/25/20 lab/MD appts. She stated that she does not wish to have appts R/S. Both appts were cx per pt request on 05/20/20.

## 2020-05-28 NOTE — Telephone Encounter (Signed)
PA for Botox approved #[id number] 05/26/20-05/26/21

## 2020-05-29 DIAGNOSIS — Z880 Allergy status to penicillin: Secondary | ICD-10-CM | POA: Diagnosis not present

## 2020-05-29 DIAGNOSIS — Z90710 Acquired absence of both cervix and uterus: Secondary | ICD-10-CM | POA: Diagnosis not present

## 2020-05-29 DIAGNOSIS — Z9012 Acquired absence of left breast and nipple: Secondary | ICD-10-CM | POA: Diagnosis not present

## 2020-05-29 DIAGNOSIS — Z800 Family history of malignant neoplasm of digestive organs: Secondary | ICD-10-CM | POA: Diagnosis not present

## 2020-05-29 DIAGNOSIS — Z79899 Other long term (current) drug therapy: Secondary | ICD-10-CM | POA: Diagnosis not present

## 2020-05-29 DIAGNOSIS — I8002 Phlebitis and thrombophlebitis of superficial vessels of left lower extremity: Secondary | ICD-10-CM | POA: Diagnosis not present

## 2020-05-29 DIAGNOSIS — D0512 Intraductal carcinoma in situ of left breast: Secondary | ICD-10-CM | POA: Diagnosis not present

## 2020-05-29 DIAGNOSIS — Z803 Family history of malignant neoplasm of breast: Secondary | ICD-10-CM | POA: Diagnosis not present

## 2020-05-29 DIAGNOSIS — R1032 Left lower quadrant pain: Secondary | ICD-10-CM | POA: Diagnosis not present

## 2020-05-29 DIAGNOSIS — Z923 Personal history of irradiation: Secondary | ICD-10-CM | POA: Diagnosis not present

## 2020-06-08 ENCOUNTER — Other Ambulatory Visit: Payer: Self-pay | Admitting: *Deleted

## 2020-06-08 DIAGNOSIS — N3000 Acute cystitis without hematuria: Secondary | ICD-10-CM

## 2020-06-09 ENCOUNTER — Other Ambulatory Visit: Payer: Self-pay

## 2020-06-09 ENCOUNTER — Telehealth: Payer: Self-pay | Admitting: Urology

## 2020-06-09 ENCOUNTER — Other Ambulatory Visit: Payer: BC Managed Care – PPO

## 2020-06-09 DIAGNOSIS — N3000 Acute cystitis without hematuria: Secondary | ICD-10-CM | POA: Diagnosis not present

## 2020-06-09 LAB — URINALYSIS, COMPLETE
Bilirubin, UA: NEGATIVE
Glucose, UA: NEGATIVE
Leukocytes,UA: NEGATIVE
Nitrite, UA: NEGATIVE
Protein,UA: NEGATIVE
RBC, UA: NEGATIVE
Specific Gravity, UA: 1.02 (ref 1.005–1.030)
Urobilinogen, Ur: 0.2 mg/dL (ref 0.2–1.0)
pH, UA: 8.5 — ABNORMAL HIGH (ref 5.0–7.5)

## 2020-06-09 LAB — MICROSCOPIC EXAMINATION

## 2020-06-09 NOTE — Telephone Encounter (Signed)
Pt has botox scheduled for 7/6 and wants to know if Dr Erlene Quan will send something in for her nerves/anxiety to [facility] on [address] to her appt.

## 2020-06-12 ENCOUNTER — Telehealth: Payer: Self-pay

## 2020-06-12 LAB — CULTURE, URINE COMPREHENSIVE

## 2020-06-12 MED ORDER — LORAZEPAM 1 MG PO TABS
ORAL_TABLET | ORAL | 0 refills | Status: DC
Start: 2020-06-12 — End: 2020-06-23

## 2020-06-12 NOTE — Telephone Encounter (Signed)
Spoke with patient, she verbalizes understanding and will have a driver that day.

## 2020-06-12 NOTE — Telephone Encounter (Signed)
Pt asking for medication for nerves/anxiety prior to botox

## 2020-06-12 NOTE — Telephone Encounter (Signed)
Ativan 1 mg x 1 dose sent to her pharmacy.

## 2020-06-12 NOTE — Telephone Encounter (Signed)
Left message for patient

## 2020-06-12 NOTE — Telephone Encounter (Signed)
Left VM to return my call

## 2020-06-12 NOTE — Telephone Encounter (Signed)
Dr. Erlene Quan has previously prescribed Ativan 1mg  x1 dose, however patient will require a driver for the procedure.

## 2020-06-13 NOTE — Progress Notes (Signed)
   06/16/2020  CC:  Chief Complaint  Patient presents with  . Botulinum Toxin Injection    HPI: Rose Bernard is a 54 y.o.  female with a personal history of severe refractory OAB managed with Botox who presents today for Botox injection.   Overall her symptoms have been well controlled but over the past couple weeks, she notices that her urinary symptoms are starting to recur.  Preprocedure UA/urine culture was unremarkable.  UA was also negative today.  She did receive periprocedural antibiotics today.  Blood pressure 135/68, pulse 77, height 5\' 6"  (1.676 m), weight 135 lb (61.2 kg), last menstrual period 02/07/1994. NED. A&Ox3.   No respiratory distress   Abd soft, NT, ND Normal external genitalia with patent urethral meatus  Cystoscopy Procedure Note  Patient identification was confirmed, informed consent was obtained, and patient was prepped using Betadine solution.  Lidocaine jelly was administered per urethral meatus.    Procedure: - Flexible cystoscope introduced, without any difficulty.  Notably, the bladder was slightly overdistended and thus the scope was removed and a red rubber was used to partially drain the bladder.  The scope was then replaced. - Thorough search of the bladder revealed:    normal urethral meatus    normal urothelium    no stones    no ulcers     no tumors    no urethral polyps    no trabeculation   - Ureteral orifices were normal in position and appearance.  At this point in time, flexible beveled injectable needle 0.5 cc was used through the working port at which time 2 rows of 10 injections were given just superior to the trigone along the posterior bladder wall delivering a total of 100 units of Botox.     Post-Procedure: - Patient tolerated the procedure well  Assessment/ Plan:  1. OAB (overactive bladder) Status post well-tolerated in office Botox injection to the bladder, 100 units  Post procedure warning symptoms  reviewed  Given that she is tolerated this medication well in the past, will just have her follow-up in 6 months.  She will return back sooner as needed.  She preferred to have her next injection in December for insurance purposes.  We will have her return a few weeks prior to this for UA and PVR to ensure that she is adequately emptying prior to her next dose.  - lidocaine (XYLOCAINE) 2 % (with pres) injection 1,200 mg - ciprofloxacin (CIPRO) tablet 500 mg   I, Selena Batten, am acting as a scribe for Dr. Hollice Espy.  I have reviewed the above documentation for accuracy and completeness, and I agree with the above.   Hollice Espy, MD

## 2020-06-16 ENCOUNTER — Ambulatory Visit (INDEPENDENT_AMBULATORY_CARE_PROVIDER_SITE_OTHER): Payer: BC Managed Care – PPO | Admitting: Urology

## 2020-06-16 ENCOUNTER — Encounter: Payer: Self-pay | Admitting: Urology

## 2020-06-16 ENCOUNTER — Other Ambulatory Visit: Payer: Self-pay

## 2020-06-16 VITALS — BP 135/68 | HR 77 | Ht 66.0 in | Wt 135.0 lb

## 2020-06-16 DIAGNOSIS — N3281 Overactive bladder: Secondary | ICD-10-CM | POA: Diagnosis not present

## 2020-06-16 MED ORDER — ONABOTULINUMTOXINA 100 UNITS IJ SOLR
100.0000 [IU] | Freq: Once | INTRAMUSCULAR | Status: AC
  Administered 2020-06-16: 100 [IU] via INTRAMUSCULAR

## 2020-06-16 MED ORDER — CIPROFLOXACIN HCL 500 MG PO TABS
500.0000 mg | ORAL_TABLET | Freq: Once | ORAL | Status: AC
  Administered 2020-06-16: 500 mg via ORAL

## 2020-06-16 MED ORDER — LIDOCAINE HCL 2 % IJ SOLN
50.0000 mL | Freq: Once | INTRAMUSCULAR | Status: AC
  Administered 2020-06-16: 1000 mg

## 2020-06-16 NOTE — Progress Notes (Signed)
Bladder Instillation  Due to OAB patient is present today for a Bladder Instillation of Lidocaine 2% prior to Bladder Botox. Patient was cleaned and prepped in a sterile fashion with betadine and lidocaine 2% jelly was instilled into the urethra.  A 14FR catheter was inserted, urine return was noted 135ml, urine was clear yellow in color.  50 ml was instilled into the bladder. The catheter was then removed. Patient tolerated well, no complications were noted Patient held in bladder for 30 minutes prior to procedure starting.   Preformed by: Fonnie Jarvis, CMA

## 2020-06-23 ENCOUNTER — Ambulatory Visit (INDEPENDENT_AMBULATORY_CARE_PROVIDER_SITE_OTHER): Payer: BC Managed Care – PPO

## 2020-06-23 ENCOUNTER — Other Ambulatory Visit: Payer: Self-pay

## 2020-06-23 ENCOUNTER — Ambulatory Visit (INDEPENDENT_AMBULATORY_CARE_PROVIDER_SITE_OTHER): Payer: BC Managed Care – PPO | Admitting: Podiatry

## 2020-06-23 ENCOUNTER — Encounter: Payer: Self-pay | Admitting: Podiatry

## 2020-06-23 DIAGNOSIS — M2141 Flat foot [pes planus] (acquired), right foot: Secondary | ICD-10-CM | POA: Diagnosis not present

## 2020-06-23 DIAGNOSIS — M2142 Flat foot [pes planus] (acquired), left foot: Secondary | ICD-10-CM | POA: Diagnosis not present

## 2020-06-23 DIAGNOSIS — M722 Plantar fascial fibromatosis: Secondary | ICD-10-CM | POA: Diagnosis not present

## 2020-06-24 ENCOUNTER — Encounter: Payer: Self-pay | Admitting: Podiatry

## 2020-06-24 NOTE — Progress Notes (Signed)
Subjective:  Patient ID: Rose Bernard, female    DOB: 08-14-1966,  MRN: [id number]  Chief Complaint  Patient presents with  . Foot Pain    Patient presents today for left heel pain    54 y.o. female presents with the above complaint.  Patient presents with complaint of left heel pain that has been going on for quite some time.  Patient is a chemo patient.  Therapy.  Patient states that all of this started right after worse.  Is painful to walk on.  Is worse in the morning or standing up from a sitting position.  There is stinging burning pain.  Patient has tried soaking it anti-inflammatory ice and heat but has not helped.  Patient stated all started after getting a blood clot to the left lower extremity.  This may have changed the way her gait is and therefore leading to pain.  She denies any other acute complaints.  She denies seeing anyone else for this.  She has had orthotics made in the past by Dr. Milinda Pointer.  I have instructed her to bring it with her.   Review of Systems: Negative except as noted in the HPI. Denies N/V/F/Ch.  Past Medical History:  Diagnosis Date  . Chronic cystitis   . Frequent UTI   . GERD (gastroesophageal reflux disease)   . History of kidney stones   . Incomplete bladder emptying   . 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  anastrozole (ARIMIDEX) 1 MG tablet, Take 1 tablet (1 mg total) by mouth daily. (Patient not taking: Reported on 04/28/2020), Disp: 90 tablet, Rfl: 0 .  Cholecalciferol 25 MCG (1000 UT) capsule, Take by mouth., Disp: , Rfl:  .  esomeprazole (NEXIUM) 20 MG capsule, Take 20 mg by mouth daily as needed (acid reflux). , Disp: , Rfl:  .  ibuprofen (ADVIL) 600 MG tablet, Take 1 tablet (600 mg total) by mouth every 6 (six) hours as needed., Disp: 30 tablet, Rfl: 0  Social History   Tobacco Use  Smoking Status Never Smoker  Smokeless Tobacco Never Used    Allergies  Allergen Reactions  .  Penicillin V Potassium Rash    Did it involve swelling of the face/tongue/throat, SOB, or low BP? No Did it involve sudden or severe rash/hives, skin peeling, or any reaction on the inside of your mouth or nose? No Did you need to seek medical attention at a hospital or doctor's office? No When did it last happen?25 years ago If all above answers are "NO", may proceed with cephalosporin use.    Objective:  There were no vitals filed for this visit. There is no height or weight on file to calculate BMI. Constitutional Well developed. Well nourished.  Vascular Dorsalis pedis pulses palpable bilaterally. Posterior tibial pulses palpable bilaterally. Capillary refill normal to all digits.  No cyanosis or clubbing noted. Pedal hair growth normal.  Neurologic Normal speech. Oriented to person, place, and time. Epicritic sensation to light touch grossly present bilaterally.  Dermatologic Nails well groomed and normal in appearance. No open wounds. No skin lesions.  Orthopedic: Normal joint ROM without pain or crepitus bilaterally. No visible deformities. Tender to palpation at the calcaneal tuber left. No pain with calcaneal squeeze left. Ankle ROM full range of motion left. Silfverskiold Test: negative left.   Radiographs: Taken and reviewed. No acute fractures or dislocations. No evidence of stress fracture.  Plantar heel spur absent. Posterior heel spur absent.  Assessment:   1. Plantar fasciitis of left foot   2. Pes planus of both feet    Plan:  Patient was evaluated and treated and all questions answered.  Plantar Fasciitis, left - XR reviewed as above.  - Educated on icing and stretching. Instructions given.  - Injection delivered to the plantar fascia as below. - DME: Plantar Fascial Brace x1 - Pharmacologic management: None -Patient has already orthotics that were made in the past have instructed her to bring it with her for me to evaluate them.  Procedure:  Injection Tendon/Ligament Location: Left plantar fascia at the glabrous junction; medial approach. Skin Prep: alcohol Injectate: 0.5 cc 0.5% marcaine plain, 0.5 cc of 1% Lidocaine, 0.5 cc kenalog 10. Disposition: Patient tolerated procedure well. Injection site dressed with a band-aid.  No follow-ups on file.

## 2020-06-25 ENCOUNTER — Ambulatory Visit: Payer: BC Managed Care – PPO | Admitting: Oncology

## 2020-06-25 ENCOUNTER — Other Ambulatory Visit: Payer: BC Managed Care – PPO

## 2020-06-26 DIAGNOSIS — Z79811 Long term (current) use of aromatase inhibitors: Secondary | ICD-10-CM | POA: Diagnosis not present

## 2020-06-26 DIAGNOSIS — C50919 Malignant neoplasm of unspecified site of unspecified female breast: Secondary | ICD-10-CM | POA: Diagnosis not present

## 2020-06-26 DIAGNOSIS — Z923 Personal history of irradiation: Secondary | ICD-10-CM | POA: Diagnosis not present

## 2020-06-26 DIAGNOSIS — R600 Localized edema: Secondary | ICD-10-CM | POA: Diagnosis not present

## 2020-06-26 DIAGNOSIS — Z880 Allergy status to penicillin: Secondary | ICD-10-CM | POA: Diagnosis not present

## 2020-06-26 DIAGNOSIS — D0512 Intraductal carcinoma in situ of left breast: Secondary | ICD-10-CM | POA: Diagnosis not present

## 2020-06-26 DIAGNOSIS — M25612 Stiffness of left shoulder, not elsewhere classified: Secondary | ICD-10-CM | POA: Diagnosis not present

## 2020-06-26 DIAGNOSIS — Z6822 Body mass index (BMI) 22.0-22.9, adult: Secondary | ICD-10-CM | POA: Diagnosis not present

## 2020-06-30 ENCOUNTER — Ambulatory Visit: Payer: Self-pay | Admitting: Physician Assistant

## 2020-07-20 ENCOUNTER — Telehealth: Payer: Self-pay | Admitting: Internal Medicine

## 2020-07-20 NOTE — Telephone Encounter (Signed)
I am not in the office Friday (or end of this week).  Given her bladder history and that she is followed regularly by urology, I recommend her contact urology and let them know what is going on - for evaluation.

## 2020-07-20 NOTE — Telephone Encounter (Signed)
Pt wanted a order for urine test having issues with bladder

## 2020-07-20 NOTE — Telephone Encounter (Signed)
Patient informed. She stated she will call urology.

## 2020-07-20 NOTE — Telephone Encounter (Signed)
Will you triage please 

## 2020-07-20 NOTE — Telephone Encounter (Signed)
Patient stated she has been having back pain, urgency, leakage, no burning, and no OTC medication. She recently had the botox in bladder in July. Patient has a prominent hx of bladder issues. Patient comes in to see Dr Nicki Reaper this Friday. She wants orders sooner for appointment please advise.

## 2020-07-21 ENCOUNTER — Ambulatory Visit: Payer: BC Managed Care – PPO | Admitting: Podiatry

## 2020-07-21 ENCOUNTER — Encounter: Payer: Self-pay | Admitting: Physician Assistant

## 2020-07-21 ENCOUNTER — Other Ambulatory Visit: Payer: Self-pay

## 2020-07-21 ENCOUNTER — Ambulatory Visit (INDEPENDENT_AMBULATORY_CARE_PROVIDER_SITE_OTHER): Payer: BC Managed Care – PPO | Admitting: Physician Assistant

## 2020-07-21 VITALS — BP 96/58 | HR 73 | Ht 67.0 in | Wt 135.0 lb

## 2020-07-21 DIAGNOSIS — R350 Frequency of micturition: Secondary | ICD-10-CM

## 2020-07-21 DIAGNOSIS — N3281 Overactive bladder: Secondary | ICD-10-CM | POA: Diagnosis not present

## 2020-07-21 LAB — URINALYSIS, COMPLETE
Bilirubin, UA: NEGATIVE
Glucose, UA: NEGATIVE
Ketones, UA: NEGATIVE
Leukocytes,UA: NEGATIVE
Nitrite, UA: NEGATIVE
Protein,UA: NEGATIVE
RBC, UA: NEGATIVE
Specific Gravity, UA: 1.025 (ref 1.005–1.030)
Urobilinogen, Ur: 0.2 mg/dL (ref 0.2–1.0)
pH, UA: 7 (ref 5.0–7.5)

## 2020-07-21 LAB — MICROSCOPIC EXAMINATION

## 2020-07-21 LAB — BLADDER SCAN AMB NON-IMAGING

## 2020-07-21 NOTE — Patient Instructions (Signed)
I'll be in touch when I get the results of your urine culture and hear back from my colleague regarding possibly retreating you with Botox vs increasing your dose.

## 2020-07-21 NOTE — Progress Notes (Signed)
07/21/2020 2:59 PM   Rose Bernard May 27, 1966 [id number]  CC: Chief Complaint  Patient presents with  . Over Active Bladder    HPI: Rose Bernard is a 54 y.o. female with PMH severe refractory OAB managed by 100 units of intravesical Botox, most recently administered on 06/16/2020, who presents today for evaluation of possible UTI.   Today she reports low back pain, frequency, urgency, and urinary leakage x2 weeks that she states is similar to how her OAB symptoms feel when she is due for another Botox injection. She reports a history of chronic constipation that is stable.  In-office UA and microscopy today pan negative. PVR 30mL.  PMH: Past Medical History:  Diagnosis Date  . Chronic cystitis   . Frequent UTI   . GERD (gastroesophageal reflux disease)   . History of kidney stones   . Incomplete bladder emptying   . 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 ABDOMINAL HYSTERECTOMY  1995   2/2 delivery complications with 3rd child. Hysterectomy and bladder repair with subsequent appendectomy. L Ovary remain in place   . APPENDECTOMY     2001 due to scar tissue  . BLADDER BOTOX  2014, 2015, 2016  . BREAST BIOPSY Bilateral 1991  . BREAST BIOPSY Left 12/12/2019   [place] bx, path pending  . BREAST SURGERY Bilateral 2002   benign mass removed  . COLONOSCOPY  2014  . EXCISION OF BREAST BIOPSY Left 01/10/2020   Procedure: EXCISION OF BREAST BIOPSY w/ Needle localization;  Surgeon: Herbert Pun, MD;  Location: [facility] OR[facility];  Service: General;  Laterality: Left;  . FOOT SURGERY Bilateral   . LAPAROSCOPIC HYSTERECTOMY  1995  . MASTECTOMY, PARTIAL Left 01/31/2020   Procedure: MASTECTOMY PARTIAL;  Surgeon: Herbert Pun, MD;  Location: [facility] ORS;  Service: General;  Laterality: Left;  . reconstruction of bladder   1995    2/2 delivery complications with 3rd child     Home  Medications:  Allergies as of 07/21/2020      Reactions   Penicillin V Potassium Rash   Did it involve swelling of the face/tongue/throat, SOB, or low BP? No Did it involve sudden or severe rash/hives, skin peeling, or any reaction on the inside of your mouth or nose? No Did you need to seek medical attention at a hospital or doctor's office? No When did it last happen?25 years ago If all above answers are "NO", may proceed with cephalosporin use.      Medication List       Accurate as of July 21, 2020  2:59 PM. If you have any questions, ask your nurse or doctor.        anastrozole 1 MG tablet Commonly known as: ARIMIDEX Take 1 tablet (1 mg total) by mouth daily.   Cholecalciferol 25 MCG (1000 UT) capsule Take by mouth.   esomeprazole 20 MG capsule Commonly known as: NEXIUM Take 20 mg by mouth daily as needed (acid reflux).   ibuprofen 600 MG tablet Commonly known as: ADVIL Take 1 tablet (600 mg total) by mouth every 6 (six) hours as needed.       Allergies:  Allergies  Allergen Reactions  . Penicillin V Potassium Rash    Did it involve swelling of the face/tongue/throat, SOB, or low BP? No Did it involve sudden or severe rash/hives, skin peeling, or any reaction on the inside of your mouth or nose? No Did you  need to seek medical attention at a hospital or doctor's office? No When did it last happen?25 years ago If all above answers are "NO", may proceed with cephalosporin use.     Family History: Family History  Problem Relation Age of Onset  . Colon cancer Father        With ostomy in place   . Breast cancer Paternal Aunt 53  . Breast cancer Maternal Aunt   . Breast cancer Maternal Aunt     Social History:   reports that she has never smoked. She has never used smokeless tobacco. She reports that she does not drink alcohol and does not use drugs.  Physical Exam: BP (!) 96/58   Pulse 73   Ht 5\' 7"  (1.702 m)   Wt 135 lb (61.2 kg)   LMP  02/07/1994   BMI 21.14 kg/m   Constitutional:  Alert and oriented, no acute distress, nontoxic appearing HEENT: [place], AT Cardiovascular: No clubbing, cyanosis, or edema Respiratory: Normal respiratory effort, no increased work of breathing Skin: No rashes, bruises or suspicious lesions Neurologic: Grossly intact, no focal deficits, moving all 4 extremities Psychiatric: Normal mood and affect  Laboratory Data: Results for orders placed or performed in visit on 07/21/20  Microscopic Examination   Urine  Result Value Ref Range   WBC, UA 0-5 0 - 5 /hpf   RBC 0-2 0 - 2 /hpf   Epithelial Cells (non renal) 0-10 0 - 10 /hpf   Mucus, UA Present (A) Not Estab.   Bacteria, UA Few None seen/Few  Urinalysis, Complete  Result Value Ref Range   Specific Gravity, UA 1.025 1.005 - 1.030   pH, UA 7.0 5.0 - 7.5   Color, UA Yellow Yellow   Appearance Ur Cloudy (A) Clear   Leukocytes,UA Negative Negative   Protein,UA Negative Negative/Trace   Glucose, UA Negative Negative   Ketones, UA Negative Negative   RBC, UA Negative Negative   Bilirubin, UA Negative Negative   Urobilinogen, Ur 0.2 0.2 - 1.0 mg/dL   Nitrite, UA Negative Negative   Microscopic Examination See below:   BLADDER SCAN AMB NON-IMAGING  Result Value Ref Range   Scan Result 24ml    Assessment & Plan:   1. Urinary frequency UA reassuring for infection, will send for culture for further evaluation and treat as indicated. PVR reassuring for urinary retention.  In consultation with Dr. Erlene Quan, I will follow up with Dr. Matilde Sprang to discuss possible re-treatment vs. Increased dose at her next scheduled treatment. Counseled patient that I would contact her when I learn more. She expressed understanding. - Urinalysis, Complete - BLADDER SCAN AMB NON-IMAGING - CULTURE, URINE COMPREHENSIVE   Return for Will call with results.  Debroah Loop, PA-C  [facility] [address], [place], [place] [address] [phone]

## 2020-07-25 LAB — CULTURE, URINE COMPREHENSIVE

## 2020-07-28 ENCOUNTER — Telehealth: Payer: Self-pay

## 2020-07-28 NOTE — Telephone Encounter (Signed)
Please contact the patient and inform her that her urine culture was negative.  She does not have a urinary tract infection. Please advise her that we are awaiting insurance PA for Botox re-treatment and will schedule her when we know more.

## 2020-07-28 NOTE — Telephone Encounter (Signed)
Pt calls and states that she is awaiting culture results. Pt states that she feels she is worse now than when she had Botox initially. She c/o flank pain, and severe incontinence she is now having to wear a pad. Please advise.

## 2020-07-29 NOTE — Telephone Encounter (Signed)
Spoke with patient, informed will initiate PA for increased dose and advise once get approved. Patient voiced understanding. Patient complains of some pressure and some difficulty urinating/leaking. Offered appointment, declined will notify if worsens.

## 2020-07-30 ENCOUNTER — Other Ambulatory Visit: Payer: Self-pay

## 2020-07-30 ENCOUNTER — Ambulatory Visit
Admission: EM | Admit: 2020-07-30 | Discharge: 2020-07-30 | Disposition: A | Discharge status: Home / Self Care | Payer: BC Managed Care – PPO | Attending: Internal Medicine | Admitting: Internal Medicine

## 2020-07-30 DIAGNOSIS — Z20822 Contact with and (suspected) exposure to covid-19: Secondary | ICD-10-CM

## 2020-07-30 DIAGNOSIS — U071 COVID-19: Secondary | ICD-10-CM | POA: Diagnosis not present

## 2020-07-30 NOTE — ED Triage Notes (Signed)
Patient in today for COVID testing. Patient is asymptomatic but has a potential COVID exposure.

## 2020-07-31 ENCOUNTER — Telehealth (INDEPENDENT_AMBULATORY_CARE_PROVIDER_SITE_OTHER): Payer: BC Managed Care – PPO | Admitting: Internal Medicine

## 2020-07-31 DIAGNOSIS — U071 COVID-19: Secondary | ICD-10-CM | POA: Diagnosis not present

## 2020-07-31 DIAGNOSIS — R232 Flushing: Secondary | ICD-10-CM

## 2020-07-31 DIAGNOSIS — K219 Gastro-esophageal reflux disease without esophagitis: Secondary | ICD-10-CM | POA: Diagnosis not present

## 2020-07-31 LAB — SARS CORONAVIRUS 2 (TAT 6-24 HRS): SARS Coronavirus 2: POSITIVE — AB

## 2020-07-31 MED ORDER — AZITHROMYCIN 250 MG PO TABS
ORAL_TABLET | ORAL | 0 refills | Status: DC
Start: 2020-07-31 — End: 2020-10-16

## 2020-07-31 NOTE — Progress Notes (Signed)
Patient ID: Rose Bernard, female   DOB: [birth year], 54 y.o.   MRN: [id number]   Virtual Visit via video Note  This visit type was conducted due to national recommendations for restrictions regarding the COVID-19 pandemic (e.g. social distancing).  This format is felt to be most appropriate for this patient at this time.  All issues noted in this document were discussed and addressed.  No physical exam was performed (except for noted visual exam findings with Video Visits).   I connected with Rose Bernard by a video enabled telemedicine application and verified that I am speaking with the correct person using two identifiers. Location patient: home Location provider: work  Persons participating in the virtual visit: patient, provider  The limitations, risks, security and privacy concerns of performing an evaluation and management service by video and the availability of in person appointments have been discussed.  It has also been discussed with the patient that there may be a patient responsible charge related to this service. The patient expressed understanding and agreed to proceed.   Reason for visit: work in appt  HPI: She reports that she mowed her yard this past weekend.  Noticed some drainage.  Developed right ear pressure and pressure - cheek bone.  Ringing in her ears.  Decreased appetite.  No vomiting and diarrhea.  No abdominal pain or cramping.  No fever.  Tested positive 07/30/20 for covid.  Has had covid previously and has had covid vaccines.  She is in quarantine.  No chest congestion or sob.  Having hot flashes.     ROS: See pertinent positives and negatives per HPI.  Past Medical History:  Diagnosis Date  . Chronic cystitis   . Frequent UTI   . GERD (gastroesophageal reflux disease)   . History of kidney stones   . Incomplete bladder emptying   . 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 ABDOMINAL  HYSTERECTOMY  1995   2/2 delivery complications with 3rd child. Hysterectomy and bladder repair with subsequent appendectomy. L Ovary remain in place   . APPENDECTOMY     2001 due to scar tissue  . BLADDER BOTOX  2014, 2015, 2016  . BREAST BIOPSY Bilateral 1991  . BREAST BIOPSY Left 12/12/2019   [place] bx, path pending  . BREAST SURGERY Bilateral 2002   benign mass removed  . COLONOSCOPY  2014  . EXCISION OF BREAST BIOPSY Left 01/10/2020   Procedure: EXCISION OF BREAST BIOPSY w/ Needle localization;  Surgeon: Herbert Pun, MD;  Location: [facility] OR[facility];  Service: General;  Laterality: Left;  . FOOT SURGERY Bilateral   . LAPAROSCOPIC HYSTERECTOMY  1995  . MASTECTOMY, PARTIAL Left 01/31/2020   Procedure: MASTECTOMY PARTIAL;  Surgeon: Herbert Pun, MD;  Location: [facility] ORS;  Service: General;  Laterality: Left;  . reconstruction of bladder   1995    2/2 delivery complications with 3rd child     Family History  Problem Relation Age of Onset  . Colon cancer Father        With ostomy in place   . Breast cancer Paternal Aunt 79  . Breast cancer Maternal Aunt   . Breast cancer Maternal Aunt     SOCIAL HX: reviewed    Current Outpatient Medications:  .  anastrozole (ARIMIDEX) 1 MG tablet, Take 1 tablet (1 mg total) by mouth daily., Disp: 90 tablet, Rfl: 0 .  azithromycin (ZITHROMAX) 250 MG tablet, Take 2 tablets x 1  day and then one tablet per day for four more days, Disp: 6 tablet, Rfl: 0 .  Cholecalciferol 25 MCG (1000 UT) capsule, Take by mouth., Disp: , Rfl:  .  esomeprazole (NEXIUM) 20 MG capsule, Take 20 mg by mouth daily as needed (acid reflux). , Disp: , Rfl:  .  ibuprofen (ADVIL) 600 MG tablet, Take 1 tablet (600 mg total) by mouth every 6 (six) hours as needed., Disp: 30 tablet, Rfl: 0  EXAM:  GENERAL: alert, oriented, appears well and in no acute distress  HEENT: atraumatic, conjunttiva clear, no obvious abnormalities on inspection of external nose and ears  NECK:  normal movements of the head and neck  LUNGS: on inspection no signs of respiratory distress, breathing rate appears normal, no obvious gross SOB, gasping or wheezing  CV: no obvious cyanosis  PSYCH/NEURO: pleasant and cooperative, no obvious depression or anxiety, speech and thought processing grossly intact  ASSESSMENT AND PLAN:  Discussed the following assessment and plan:  Lab test positive for detection of COVID-19 virus Recently tested positive for covid.  Has had covid previously and has been vaccinated.  Is quarantined. States feels like typical sinus infection.  Discussed using saline nasal spray and steroid nasal spray.  zpak as directed.  Discussed monoclonal antibodies.  Rest. Fluids.  Follow closely.   Hot flashes Vitamin E.   GERD (gastroesophageal reflux disease) No upper symptoms reported. Nexium.    No orders of the defined types were placed in this encounter.   Meds ordered this encounter  Medications  . azithromycin (ZITHROMAX) 250 MG tablet    Sig: Take 2 tablets x 1 day and then one tablet per day for four more days    Dispense:  6 tablet    Refill:  0     I discussed the assessment and treatment plan with the patient. The patient was provided an opportunity to ask questions and all were answered. The patient agreed with the plan and demonstrated an understanding of the instructions.   The patient was advised to call back or seek an in-person evaluation if the symptoms worsen or if the condition fails to improve as anticipated.   Einar Pheasant, MD

## 2020-08-03 ENCOUNTER — Telehealth: Payer: Self-pay | Admitting: Podiatry

## 2020-08-03 NOTE — Telephone Encounter (Signed)
Patient called in, she says that she was diagnosed with Plantar Fasciitis, but her claim was files saying that she has Pes Planus and the insurance company has denied payment . That patient is requesting that the diagnosis  be changed so that the insurance company will pay the claim. The patient was given this number and says that it is the corrective claim number [id number].

## 2020-08-04 ENCOUNTER — Ambulatory Visit: Payer: BC Managed Care – PPO | Admitting: Podiatry

## 2020-08-04 ENCOUNTER — Telehealth: Payer: Self-pay | Admitting: Internal Medicine

## 2020-08-04 NOTE — Telephone Encounter (Signed)
Pt states that the health dept told her she could get booster with recent covid diagnosis. She is also wondering if someone is going to call her about scheduling infusions. Please advise

## 2020-08-05 NOTE — Telephone Encounter (Signed)
Patient would like to know if she needs to get the COVID booster. Advised that she may not meet criteria for infusion. Confirmed pt is doing ok. Feeling ok. Just feels like she has a mild sinus infection no. No trouble breathing or any other acute sx noted.

## 2020-08-05 NOTE — Telephone Encounter (Signed)
Regarding the booster, I would hold off for now until she is better.  Would not recommend taking while sick.  Also, can we check and confirm if she qualifies for the infusion.

## 2020-08-07 ENCOUNTER — Encounter: Payer: Self-pay | Admitting: Internal Medicine

## 2020-08-07 NOTE — Telephone Encounter (Signed)
She is to continue mucinex, steroid nasal spray and saline nasal spray.  Took otc sinus medication today.  Discussed afrin for three days.  Offered to call in medication for nausea.  She declines.  Does not feel she needs.  Will call with update over the next few days.

## 2020-08-07 NOTE — Telephone Encounter (Signed)
Patient discused with you during husbands appt.

## 2020-08-09 ENCOUNTER — Encounter: Payer: Self-pay | Admitting: Internal Medicine

## 2020-08-09 NOTE — Assessment & Plan Note (Signed)
Vitamin E

## 2020-08-09 NOTE — Assessment & Plan Note (Signed)
No upper symptoms reported.  Nexium.  

## 2020-08-09 NOTE — Assessment & Plan Note (Addendum)
Recently tested positive for covid.  Has had covid previously and has been vaccinated.  Is quarantined. States feels like typical sinus infection.  Discussed using saline nasal spray and steroid nasal spray.  zpak as directed.  Discussed monoclonal antibodies.  Rest. Fluids.  Follow closely.

## 2020-08-10 NOTE — Telephone Encounter (Signed)
Left detailed message for patient.

## 2020-09-28 ENCOUNTER — Ambulatory Visit: Payer: BC Managed Care – PPO | Admitting: Podiatry

## 2020-09-28 ENCOUNTER — Other Ambulatory Visit: Payer: Self-pay

## 2020-09-28 ENCOUNTER — Encounter: Payer: Self-pay | Admitting: Podiatry

## 2020-09-28 ENCOUNTER — Ambulatory Visit (INDEPENDENT_AMBULATORY_CARE_PROVIDER_SITE_OTHER): Payer: BC Managed Care – PPO | Admitting: Podiatry

## 2020-09-28 DIAGNOSIS — M722 Plantar fascial fibromatosis: Secondary | ICD-10-CM

## 2020-09-28 DIAGNOSIS — M79671 Pain in right foot: Secondary | ICD-10-CM

## 2020-09-28 DIAGNOSIS — M19071 Primary osteoarthritis, right ankle and foot: Secondary | ICD-10-CM

## 2020-09-28 DIAGNOSIS — Z969 Presence of functional implant, unspecified: Secondary | ICD-10-CM | POA: Diagnosis not present

## 2020-09-28 NOTE — Patient Instructions (Signed)
Pre-Operative Instructions  Congratulations, you have decided to take an important step towards improving your quality of life.  You can be assured that the doctors and staff at [facility] will be with you every step of the way.  Here are some important things you should know:  1. Plan to be at the surgery center/hospital at least 1 (one) hour prior to your scheduled time, unless otherwise directed by the surgical center/hospital staff.  You must have a responsible adult accompany you, remain during the surgery and drive you home.  Make sure you have directions to the surgical center/hospital to ensure you arrive on time. 2. If you are having surgery at [facility] or [facility], you will need a copy of your medical history and physical form from your family physician within one month prior to the date of surgery. We will give you a form for your primary physician to complete.  3. We make every effort to accommodate the date you request for surgery.  However, there are times where surgery dates or times have to be moved.  We will contact you as soon as possible if a change in schedule is required.   4. No aspirin/ibuprofen for one week before surgery.  If you are on aspirin, any non-steroidal anti-inflammatory medications (Mobic, Aleve, Ibuprofen) should not be taken seven (7) days prior to your surgery.  You make take Tylenol for pain prior to surgery.  5. Medications - If you are taking daily heart and blood pressure medications, seizure, reflux, allergy, asthma, anxiety, pain or diabetes medications, make sure you notify the surgery center/hospital before the day of surgery so they can tell you which medications you should take or avoid the day of surgery. 6. No food or drink after midnight the night before surgery unless directed otherwise by surgical center/hospital staff. 7. No alcoholic beverages 24-hours prior to surgery.  No smoking 24-hours prior or 24-hours after  surgery. 8. Wear loose pants or shorts. They should be loose enough to fit over bandages, boots, and casts. 9. Don't wear slip-on shoes. Sneakers are preferred. 10. Bring your boot with you to the surgery center/hospital.  Also bring crutches or a walker if your physician has prescribed it for you.  If you do not have this equipment, it will be provided for you after surgery. 11. If you have not been contacted by the surgery center/hospital by the day before your surgery, call to confirm the date and time of your surgery. 12. Leave-time from work may vary depending on the type of surgery you have.  Appropriate arrangements should be made prior to surgery with your employer. 13. Prescriptions will be provided immediately following surgery by your doctor.  Fill these as soon as possible after surgery and take the medication as directed. Pain medications will not be refilled on weekends and must be approved by the doctor. 14. Remove nail polish on the operative foot and avoid getting pedicures prior to surgery. 15. Wash the night before surgery.  The night before surgery wash the foot and leg well with water and the antibacterial soap provided. Be sure to pay special attention to beneath the toenails and in between the toes.  Wash for at least three (3) minutes. Rinse thoroughly with water and dry well with a towel.  Perform this wash unless told not to do so by your physician.  Enclosed: 1 Ice pack (please put in freezer the night before surgery)   1 Hibiclens skin cleaner     Pre-op instructions  If you have any questions regarding the instructions, please do not hesitate to call our office.  [place]: [address], [place], [place] [address] -- [phone]  [place]: [address] [place], [place] [address] -- [phone]  [place]: [address], [place], [place] [address] -- [phone]   Website: [email or web address] 

## 2020-09-28 NOTE — Progress Notes (Signed)
  Subjective:  Patient ID: Rose Bernard, female    DOB: 08/12/1966,  MRN: [id number]  Chief Complaint  Patient presents with  . Plantar Fasciitis    "It still bothers me and my right foot is hurting in the toe where the pin is again"    54 y.o. female presents with the above complaint. History confirmed with patient.  The left foot heel pain has been present for several months, she last saw Dr. Posey Pronto in July of this year and had an injection which helped for a couple months.  The plantar fascial brace made her pain worse..  She has her orthotics with her today to examine them.  She also complains of pain in the dorsal right forefoot, and she had bunion surgery with Dr. Paulla Dolly many years ago.  She previously discussed with Dr. Milinda Pointer about removing the pins that are still in there.  Objective:  Physical Exam: warm, good capillary refill, no trophic changes or ulcerative lesions, normal DP and PT pulses and normal sensory exam. Left Foot: Pain on palpation to the plantar calcaneal insertion of the plantar fascia medially Right Foot: Mild diffuse forefoot pain dorsally over the first interspace and the dorsal lateral first metatarsal.  She has good range of motion that is smooth and pain-free of the first metatarsophalangeal joint  Previous radiographs were reviewed, the right foot she has 2 Kirschner wires implanted with mild joint space narrowing laterally Assessment:  No diagnosis found.   Plan:  Patient was evaluated and treated and all questions answered.  -For the left side I reiterated stretching exercises with her and anti-inflammatories.  Her husband was seen here for the same issue and his symptoms improved after a Medrol taper.  I prescribed this for her and sent to her pharmacy -Her orthotics are in good condition I believe that they fit well.  She should continue wearing these at work -I recommend repeat injection today.  Following sterile prep with alcohol, 0.25 cc of each of  the following were injected into the left heel at the plantar fascial insertion medial calf calcaneus: 0.5% Marcaine plain, 2% Xylocaine plain, 5 mg Kenalog, 2 mg dexamethasone phosphate -If this continues to not improve we should consider endoscopic plantar fascial release with MRI prior to this she will let me know how she is doing in about 4 to 6 weeks  -For the right foot her symptoms are fairly diffuse about the first metatarsal.  She previously discussed heart removal with Dr. Milinda Pointer and I think this is a reasonable approach to see if this would improve her symptoms.  Her joint currently has good range of motion and is pain-free despite lateral joint space narrowing on her radiographs.  I discussed the risk, benefits, potential complications of removing the Kirschner wires and she desires surgical intervention.  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  We will consider steroid injection of the first MTPJ at the same time    Return for after surgery.

## 2020-09-30 MED ORDER — METHYLPREDNISOLONE 4 MG PO TABS
ORAL_TABLET | ORAL | 0 refills | Status: DC
Start: 2020-09-30 — End: 2020-10-16

## 2020-09-30 NOTE — Addendum Note (Signed)
Addended by: Graceann Congress D on: 09/30/2020 05:04 PM   Modules accepted: Orders

## 2020-10-02 DIAGNOSIS — I890 Lymphedema, not elsewhere classified: Secondary | ICD-10-CM | POA: Diagnosis not present

## 2020-10-02 DIAGNOSIS — M2560 Stiffness of unspecified joint, not elsewhere classified: Secondary | ICD-10-CM | POA: Diagnosis not present

## 2020-10-02 DIAGNOSIS — Z6821 Body mass index (BMI) 21.0-21.9, adult: Secondary | ICD-10-CM | POA: Diagnosis not present

## 2020-10-02 DIAGNOSIS — D0512 Intraductal carcinoma in situ of left breast: Secondary | ICD-10-CM | POA: Diagnosis not present

## 2020-10-02 LAB — HM MAMMOGRAPHY

## 2020-10-07 ENCOUNTER — Ambulatory Visit: Payer: BC Managed Care – PPO | Admitting: Radiation Oncology

## 2020-10-16 ENCOUNTER — Other Ambulatory Visit: Payer: Self-pay

## 2020-10-16 ENCOUNTER — Encounter: Payer: Self-pay | Admitting: Emergency Medicine

## 2020-10-16 ENCOUNTER — Ambulatory Visit
Admission: EM | Admit: 2020-10-16 | Discharge: 2020-10-16 | Disposition: A | Discharge status: Home / Self Care | Payer: BC Managed Care – PPO

## 2020-10-16 DIAGNOSIS — B079 Viral wart, unspecified: Secondary | ICD-10-CM | POA: Diagnosis not present

## 2020-10-16 NOTE — ED Triage Notes (Signed)
Patient c/o right 2nd finger pain.  Patient states that she had a small bump develop on her right 2nd finger 3 weeks ago and has not gone away.  Patient denies fevers.

## 2020-10-16 NOTE — Discharge Instructions (Signed)
You can use over-the-counter salicylic acid for Compound W liquid nitrogen spray to treat the wart.  This process could take several weeks.  Another option would be to consult your dermatologist about laser or liquid nitrogen treatment to freeze the wart off.

## 2020-10-16 NOTE — ED Provider Notes (Signed)
[facility]    CSN: [id number] Arrival date & time: 10/16/20  0810      History   Chief Complaint Chief Complaint  Patient presents with   finger pain    right 2nd finger    HPI Rose Bernard is a 54 y.o. female.   54 year old female here for evaluation of bump on right index finger.  Patient reports that the bump has been there about 3 weeks.  It is over the last joint of her right index finger and throbs from time to time.  Patient thought that she had gotten a piece of material in it from work and she cut the top down.  She reports that she got a little clear fluid out of it but did not find any foreign bodies.  The area is hard and not draining anything.       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     Patient Active Problem List   Diagnosis Date Noted   Lab test positive for detection of COVID-19 virus 07/31/2020   Family history of thrombosis 04/28/2020   Family history of cancer 04/28/2020   Thrombophlebitis of superficial veins of left lower extremity 04/28/2020   URI (upper respiratory infection) 04/27/2020   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   Internal nasal lesion 11/24/2019   Foot pain, right 11/24/2019   GERD (gastroesophageal reflux disease) 11/24/2019   Abdominal pain 09/15/2019   Chest pain 09/15/2019   Stress 04/23/2018   Anemia 03/31/2018   Shingles 01/18/2018   Gross hematuria 10/24/2017   Nephrolithiasis 29/51/8841   Renal colic 66/05/3015   Left carotid bruit 08/02/2017   Hot flashes 02/09/2016   Loss of weight 10/02/2015   Myofascial pain 09/16/2015   Chronic female pelvic pain 09/16/2015   Abdominal pain, left lower quadrant 09/07/2015   Abnormal mammogram 04/12/2015   Health care  maintenance 04/12/2015   Sacral pain 12/29/2013   Urinary frequency 05/15/2013   Urge incontinence of urine 05/15/2013   UTI (urinary tract infection) 02/27/2013   Family history of colon cancer 02/20/2013   Headache 02/11/2013   Chronic cystitis 02/11/2013   History of ovarian cyst 02/11/2013   Reflux, vesicoureteral 10/15/2012   Symptoms involving urinary system 10/15/2012   Mixed incontinence 10/15/2012   Incomplete bladder emptying 10/15/2012   Disorder of bladder function 10/15/2012    Past Surgical History:  Procedure Laterality Date   ABDOMINAL HYSTERECTOMY  1995   2/2 delivery complications with 3rd child. Hysterectomy and bladder repair with subsequent appendectomy. L Ovary remain in place    APPENDECTOMY     2001 due to scar tissue   BLADDER BOTOX  2014, 2015, 2016   BREAST BIOPSY Bilateral 1991   BREAST BIOPSY Left 12/12/2019   [place] bx, path pending   BREAST SURGERY Bilateral 2002   benign mass removed   COLONOSCOPY  2014   EXCISION OF BREAST BIOPSY Left 01/10/2020   Procedure: EXCISION OF BREAST BIOPSY w/ Needle localization;  Surgeon: Herbert Pun, MD;  Location: [facility] OR[facility];  Service: General;  Laterality: Left;   FOOT SURGERY Bilateral    [place], PARTIAL Left 01/31/2020   Procedure: MASTECTOMY PARTIAL;  Surgeon: Herbert Pun, MD;  Location: [facility] OR[facility];  Service: General;  Laterality: Left;   reconstruction of bladder   1995    2/2 delivery complications with 3rd child     OB History    Gravida  3   Para  2   Term      Preterm      AB      Living  2     SAB      TAB      Ectopic      Multiple      Live Births           Obstetric Comments  One still born 1st Menstrual Cycle:  78  1st Pregnancy:  24          Home Medications    Prior to Admission medications   Medication Sig Start Date End Date Taking? Authorizing Provider  anastrozole (ARIMIDEX) 1 MG  tablet Take 1 tablet (1 mg total) by mouth daily. 04/17/20  Yes Earlie Server, MD  Cholecalciferol 25 MCG (1000 UT) capsule Take by mouth.   Yes [provider]  esomeprazole (NEXIUM) 20 MG capsule Take 20 mg by mouth daily as needed (acid reflux).    Yes [provider]  ibuprofen (ADVIL) 600 MG tablet Take 1 tablet (600 mg total) by mouth every 6 (six) hours as needed. 04/25/20 04/25/21  Drenda Freeze, MD    Family History Family History  Problem Relation Age of Onset   Colon cancer Father        With ostomy in place    Breast cancer Paternal Aunt 4   Breast cancer Maternal Aunt    Breast cancer Maternal Aunt     Social History Social History   Tobacco Use   Smoking status: Never Smoker   Smokeless tobacco: Never Used  Vaping Use   Vaping Use: Never used  Substance Use Topics   Alcohol use: No    Alcohol/week: 0.0 standard drinks   Drug use: No     Allergies   Penicillin v potassium   Review of Systems Review of Systems  Constitutional: Negative for activity change, appetite change and fever.  HENT: Negative for congestion, rhinorrhea and sore throat.   Respiratory: Negative for cough and shortness of breath.   Cardiovascular: Negative for chest pain.  Gastrointestinal: Negative for diarrhea.  Musculoskeletal: Positive for joint swelling. Negative for arthralgias and myalgias.  Skin: Positive for color change. Negative for rash.       Heart red bump on the right index finger  Neurological: Negative for weakness and numbness.  Hematological: Negative.   Psychiatric/Behavioral: Negative.      Physical Exam Triage Vital Signs ED Triage Vitals  Enc Vitals Group     BP 10/16/20 0823 118/69     Pulse Rate 10/16/20 0823 67     Resp 10/16/20 0823 14     Temp 10/16/20 0823 97.9 F (36.6 C)     Temp Source 10/16/20 0823 Oral     SpO2 10/16/20 0823 100 %     Weight 10/16/20 0820 130 lb (59 kg)     Height 10/16/20 0820 5\' 7"  (1.702 m)      Head Circumference --      Peak Flow --      Pain Score 10/16/20 0820 5     Pain Loc --      Pain Edu? --      Excl. in [place]? --    No data found.  Updated Vital Signs BP 118/69 (BP Location:  Right Arm)    Pulse 67    Temp 97.9 F (36.6 C) (Oral)    Resp 14    Ht 5\' 7"  (1.702 m)    Wt 130 lb (59 kg)    LMP 02/07/1994    SpO2 100%    BMI 20.36 kg/m   Visual Acuity Right Eye Distance:   Left Eye Distance:   Bilateral Distance:    Right Eye Near:   Left Eye Near:    Bilateral Near:     Physical Exam Vitals and nursing note reviewed.  Constitutional:      General: She is not in acute distress.    Appearance: Normal appearance. She is not toxic-appearing.  HENT:     Head: Normocephalic and atraumatic.  Eyes:     General: No scleral icterus.    Extraocular Movements: Extraocular movements intact.     Conjunctiva/sclera: Conjunctivae normal.     Pupils: Pupils are equal, round, and reactive to light.  Cardiovascular:     Rate and Rhythm: Normal rate and regular rhythm.     Pulses: Normal pulses.     Heart sounds: Normal heart sounds. No murmur heard.  No gallop.   Pulmonary:     Effort: Pulmonary effort is normal.     Breath sounds: Normal breath sounds. No wheezing, rhonchi or rales.  Musculoskeletal:        General: Tenderness present. No signs of injury. Normal range of motion.     Comments: There is tenderness over the DIP joint of the right index finger.  Skin:    General: Skin is warm and dry.     Capillary Refill: Capillary refill takes less than 2 seconds.     Findings: Lesion present. No erythema or rash.     Comments: There is a hard raised area the size of a pencil eraser on the lateral aspect of the right index finger overlying the DIP joint.  The area has mild erythema surrounding it but no induration, warmth, or drainage.  Neurological:     General: No focal deficit present.     Mental Status: She is alert and oriented to person, place, and time.   Psychiatric:        Mood and Affect: Mood normal.        Behavior: Behavior normal.        Thought Content: Thought content normal.        Judgment: Judgment normal.      UC Treatments / Results  Labs (all labs ordered are listed, but only abnormal results are displayed) Labs Reviewed - No data to display  EKG   Radiology No results found.  Procedures Procedures (including critical care time)  Medications Ordered in UC Medications - No data to display  Initial Impression / Assessment and Plan / UC Course  I have reviewed the triage vital signs and the nursing notes.  Pertinent labs & imaging results that were available during my care of the patient were reviewed by me and considered in my medical decision making (see chart for details).   Patient is here for evaluation of a bump on her right index finger that has been there for 2 to 3 weeks.  Physical exam reveals what appears to be a wart on the medial aspect of her right DIP.  Discussed with patient treatment options including over-the-counter Compound W or referral to dermatology for laser or liquid nitrogen treatment.  Patient has a dermatologist that she has seen in the  past and would like to try over-the-counter remedies first.   Final Clinical Impressions(s) / UC Diagnoses   Final diagnoses:  Viral wart on finger     Discharge Instructions     You can use over-the-counter salicylic acid for Compound W liquid nitrogen spray to treat the wart.  This process could take several weeks.  Another option would be to consult your dermatologist about laser or liquid nitrogen treatment to freeze the wart off.    ED Prescriptions    None     PDMP not reviewed this encounter.   Margarette Canada, NP 10/16/20 [phone]

## 2020-10-27 ENCOUNTER — Encounter: Payer: Self-pay | Admitting: *Deleted

## 2020-11-09 ENCOUNTER — Encounter: Payer: Self-pay | Admitting: Internal Medicine

## 2020-11-09 ENCOUNTER — Other Ambulatory Visit: Payer: Self-pay

## 2020-11-09 ENCOUNTER — Ambulatory Visit (INDEPENDENT_AMBULATORY_CARE_PROVIDER_SITE_OTHER): Payer: BC Managed Care – PPO | Admitting: Internal Medicine

## 2020-11-09 VITALS — BP 114/70 | HR 62 | Temp 97.5°F | Resp 16 | Ht 67.0 in | Wt 136.0 lb

## 2020-11-09 DIAGNOSIS — D649 Anemia, unspecified: Secondary | ICD-10-CM | POA: Diagnosis not present

## 2020-11-09 DIAGNOSIS — Z13220 Encounter for screening for lipoid disorders: Secondary | ICD-10-CM

## 2020-11-09 DIAGNOSIS — Z0000 Encounter for general adult medical examination without abnormal findings: Secondary | ICD-10-CM

## 2020-11-09 DIAGNOSIS — D0512 Intraductal carcinoma in situ of left breast: Secondary | ICD-10-CM

## 2020-11-09 DIAGNOSIS — M7989 Other specified soft tissue disorders: Secondary | ICD-10-CM

## 2020-11-09 DIAGNOSIS — K219 Gastro-esophageal reflux disease without esophagitis: Secondary | ICD-10-CM

## 2020-11-09 DIAGNOSIS — N3020 Other chronic cystitis without hematuria: Secondary | ICD-10-CM

## 2020-11-09 DIAGNOSIS — M79671 Pain in right foot: Secondary | ICD-10-CM

## 2020-11-09 DIAGNOSIS — J3489 Other specified disorders of nose and nasal sinuses: Secondary | ICD-10-CM

## 2020-11-09 DIAGNOSIS — F439 Reaction to severe stress, unspecified: Secondary | ICD-10-CM

## 2020-11-09 LAB — CBC WITH DIFFERENTIAL/PLATELET
Basophils Absolute: 0.1 10*3/uL (ref 0.0–0.1)
Basophils Relative: 1.1 % (ref 0.0–3.0)
Eosinophils Absolute: 0.1 10*3/uL (ref 0.0–0.7)
Eosinophils Relative: 1.5 % (ref 0.0–5.0)
HCT: 39.8 % (ref 36.0–46.0)
Hemoglobin: 13.3 g/dL (ref 12.0–15.0)
Lymphocytes Relative: 26.4 % (ref 12.0–46.0)
Lymphs Abs: 1.3 10*3/uL (ref 0.7–4.0)
MCHC: 33.5 g/dL (ref 30.0–36.0)
MCV: 90.1 fl (ref 78.0–100.0)
Monocytes Absolute: 0.4 10*3/uL (ref 0.1–1.0)
Monocytes Relative: 8.6 % (ref 3.0–12.0)
Neutro Abs: 3 10*3/uL (ref 1.4–7.7)
Neutrophils Relative %: 62.4 % (ref 43.0–77.0)
Platelets: 259 10*3/uL (ref 150.0–400.0)
RBC: 4.41 Mil/uL (ref 3.87–5.11)
RDW: 13.9 % (ref 11.5–15.5)
WBC: 4.8 10*3/uL (ref 4.0–10.5)

## 2020-11-09 LAB — COMPREHENSIVE METABOLIC PANEL
ALT: 12 U/L (ref 0–35)
AST: 14 U/L (ref 0–37)
Albumin: 4.1 g/dL (ref 3.5–5.2)
Alkaline Phosphatase: 66 U/L (ref 39–117)
BUN: 15 mg/dL (ref 6–23)
CO2: 32 mEq/L (ref 19–32)
Calcium: 9.2 mg/dL (ref 8.4–10.5)
Chloride: 104 mEq/L (ref 96–112)
Creatinine, Ser: 0.71 mg/dL (ref 0.40–1.20)
GFR: 96.13 mL/min (ref >60.00)
Glucose, Bld: 81 mg/dL (ref 70–99)
Potassium: 4 mEq/L (ref 3.5–5.1)
Sodium: 141 mEq/L (ref 135–145)
Total Bilirubin: 0.4 mg/dL (ref 0.2–1.2)
Total Protein: 6.7 g/dL (ref 6.0–8.3)

## 2020-11-09 LAB — LIPID PANEL
Cholesterol: 197 mg/dL (ref 0–200)
HDL: 71.2 mg/dL (ref >39.00)
LDL Cholesterol: 100 mg/dL — ABNORMAL HIGH (ref 0–99)
NonHDL: 126.1
Total CHOL/HDL Ratio: 3
Triglycerides: 129 mg/dL (ref 0.0–149.0)
VLDL: 25.8 mg/dL (ref 0.0–40.0)

## 2020-11-09 LAB — TSH: TSH: 1.87 u[IU]/mL (ref 0.35–4.50)

## 2020-11-09 NOTE — Progress Notes (Signed)
Patient ID: REGINA GANCI, female   DOB: [birth year], 54 y.o.   MRN: [id number]   Subjective:    Patient ID: [name], female    DOB: [birth year], 54 y.o.   MRN: [id number]  HPI This visit occurred during the SARS-CoV-2 public health emergency.  Safety protocols were in place, including screening questions prior to the visit, additional usage of staff PPE, and extensive cleaning of exam room while observing appropriate contact time as indicated for disinfecting solutions.  Patient here for her physical exam.  She reports she is doing relatively well.  Increased stress.  Daughter has moved in with her.  Taking care of her grandchildren.  Overall she feels she is handling things relatively well.  Tries to stay active.  Denies chest pain or sob.  No cough or congestion.  Is s/p covid.  She does not feel her taste and smell are completely back to normal, but otherwise has no residual effects.  No acid reflux reported.  No abdominal pain.  Bowels moving.  Had f/u with oncology 10/02/20 - f/u CID.  On arimidex.  Tolerating.  Persistent LLE swelling.  They mentioned PT for possible lymphedema pump.  Has multiple spider veins.  Discussed [facility] evaluation.  Does report sores in her nose.  Has been using otc abx cream.  Has had issues with her feet.  Planning right toe surgery in 11/2020.  Left - plantar fasciitis and some pain - posterior/achilles.  Seeing podiatry. Placed in a boot.    Past Medical History:  Diagnosis Date  . Chronic cystitis   . Frequent UTI   . GERD (gastroesophageal reflux disease)   . History of kidney stones   . Incomplete bladder emptying   . 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 ABDOMINAL HYSTERECTOMY  1995   2/2 delivery complications with 3rd child. Hysterectomy and bladder repair with subsequent appendectomy. L Ovary remain in place   . APPENDECTOMY     2001 due to scar tissue  . BLADDER BOTOX  2014,  2015, 2016  . BREAST BIOPSY Bilateral 1991  . BREAST BIOPSY Left 12/12/2019   [place] bx, path pending  . BREAST SURGERY Bilateral 2002   benign mass removed  . COLONOSCOPY  2014  . EXCISION OF BREAST BIOPSY Left 01/10/2020   Procedure: EXCISION OF BREAST BIOPSY w/ Needle localization;  Surgeon: Herbert Pun, MD;  Location: [facility] ORS;  Service: General;  Laterality: Left;  . FOOT SURGERY Bilateral   . LAPAROSCOPIC HYSTERECTOMY  1995  . MASTECTOMY, PARTIAL Left 01/31/2020   Procedure: MASTECTOMY PARTIAL;  Surgeon: Herbert Pun, MD;  Location: [facility] ORS;  Service: General;  Laterality: Left;  . reconstruction of bladder   1995    2/2 delivery complications with 3rd child    Family History  Problem Relation Age of Onset  . Colon cancer Father        With ostomy in place   . Breast cancer Paternal Aunt 57  . Breast cancer Maternal Aunt   . Breast cancer Maternal Aunt    Social History   Socioeconomic History  . Marital status: Married    Spouse name: Not on file  . Number of children: 2  . Years of education: Not on file  . Highest education level: Not on file  Occupational History  . Not on file  Tobacco Use  . Smoking status: Never Smoker  . Smokeless tobacco: Never Used  Vaping  Use  . Vaping Use: Never used  Substance and Sexual Activity  . Alcohol use: No    Alcohol/week: 0.0 standard drinks  . Drug use: No  . Sexual activity: Yes    Birth control/protection: Surgical  Other Topics Concern  . Not on file  Social History Narrative  . Not on file   Social Determinants of Health   Financial Resource Strain:   . Difficulty of Paying Living Expenses: Not on file  Food Insecurity:   . Worried About Charity fundraiser in the Last Year: Not on file  . Ran Out of Food in the Last Year: Not on file  Transportation Needs:   . Lack of Transportation (Medical): Not on file  . Lack of Transportation (Non-Medical): Not on file  Physical Activity:   . Days of  Exercise per Week: Not on file  . Minutes of Exercise per Session: Not on file  Stress:   . Feeling of Stress : Not on file  Social Connections:   . Frequency of Communication with Friends and Family: Not on file  . Frequency of Social Gatherings with Friends and Family: Not on file  . Attends Religious Services: Not on file  . Active Member of Clubs or Organizations: Not on file  . Attends Archivist Meetings: Not on file  . Marital Status: Not on file    Outpatient Encounter Medications as of 11/09/2020  Medication Sig  . anastrozole (ARIMIDEX) 1 MG tablet Take 1 tablet (1 mg total) by mouth daily.  . Cholecalciferol 25 MCG (1000 UT) capsule Take by mouth.  . esomeprazole (NEXIUM) 20 MG capsule Take 20 mg by mouth daily as needed (acid reflux).   Marland Kitchen ibuprofen (ADVIL) 600 MG tablet Take 1 tablet (600 mg total) by mouth every 6 (six) hours as needed.  . mupirocin ointment (BACTROBAN) 2 % Apply 1 application topically 2 (two) times daily.   No facility-administered encounter medications on file as of 11/09/2020.    Review of Systems  Constitutional: Negative for appetite change and unexpected weight change.  HENT: Negative for congestion, sinus pressure and sore throat.   Eyes: Negative for pain and visual disturbance.  Respiratory: Negative for cough, chest tightness and shortness of breath.   Cardiovascular: Negative for chest pain and palpitations.       Reports persistent left lower extremity leg swelling.   Gastrointestinal: Negative for abdominal pain, diarrhea, nausea and vomiting.  Genitourinary: Negative for difficulty urinating and dysuria.  Musculoskeletal: Negative for joint swelling and myalgias.       Foot pain as outlined.   Skin: Negative for color change and rash.  Neurological: Negative for dizziness and headaches.  Hematological: Negative for adenopathy. Does not bruise/bleed easily.  Psychiatric/Behavioral: Negative for agitation and dysphoric mood.        Increased stress.         Objective:    Physical Exam Vitals reviewed.  Constitutional:      General: She is not in acute distress.    Appearance: Normal appearance. She is well-developed.  HENT:     Head: Normocephalic and atraumatic.     Right Ear: External ear normal.     Left Ear: External ear normal.  Eyes:     General: No scleral icterus.       Right eye: No discharge.        Left eye: No discharge.     Conjunctiva/sclera: Conjunctivae normal.  Neck:     Thyroid: No  thyromegaly.  Cardiovascular:     Rate and Rhythm: Normal rate and regular rhythm.  Pulmonary:     Effort: No tachypnea, accessory muscle usage or respiratory distress.     Breath sounds: Normal breath sounds. No decreased breath sounds or wheezing.     Comments: Breasts:  No nipple discharge or nipple retraction present.  Could not appreciate any distinct nodules or axillary adenopathy to be present.  Well healed incision - left breast.  Chest:     Breasts:        Right: No inverted nipple, mass, nipple discharge or tenderness (no axillary adenopathy).        Left: No inverted nipple, mass, nipple discharge or tenderness (no axilarry adenopathy).  Abdominal:     General: Bowel sounds are normal.     Palpations: Abdomen is soft.     Tenderness: There is no abdominal tenderness.  Musculoskeletal:        General: No tenderness.     Cervical back: Neck supple. No tenderness.     Comments: Minimal increased swelling - lower extremities.  No increased erythema.  Multiple spider veins.  No calf tenderness.   Lymphadenopathy:     Cervical: No cervical adenopathy.  Skin:    Findings: No erythema or rash.  Neurological:     Mental Status: She is alert and oriented to person, place, and time.  Psychiatric:        Mood and Affect: Mood normal.        Behavior: Behavior normal.     BP 114/70   Pulse 62   Temp (!) 97.5 F (36.4 C) (Oral)   Resp 16   Ht 5\' 7"  (1.702 m)   Wt 136 lb (61.7 kg)   LMP  02/07/1994   SpO2 99%   BMI 21.30 kg/m  Wt Readings from Last 3 Encounters:  11/09/20 136 lb (61.7 kg)  10/16/20 130 lb (59 kg)  07/30/20 140 lb (63.5 kg)     Lab Results  Component Value Date   WBC 4.8 11/09/2020   HGB 13.3 11/09/2020   HCT 39.8 11/09/2020   PLT 259.0 11/09/2020   GLUCOSE 81 11/09/2020   CHOL 197 11/09/2020   TRIG 129.0 11/09/2020   HDL 71.20 11/09/2020   LDLCALC 100 (H) 11/09/2020   ALT 12 11/09/2020   AST 14 11/09/2020   NA 141 11/09/2020   K 4.0 11/09/2020   CL 104 11/09/2020   CREATININE 0.71 11/09/2020   BUN 15 11/09/2020   CO2 32 11/09/2020   TSH 1.87 11/09/2020   HGBA1C 5.6 10/01/2015       Assessment & Plan:   Problem List Items Addressed This Visit    Stress    Increased stress as outlined.  Discussed wit her today.  Reports she is handling things relatively well.  Follow.       Leg swelling    Reports persistent increased swelling - left leg.  Has not been on her feet today - swelling not as bad currently, but has been a persistent problem.  Has increased spider veins.  Reviewed oncology note.  They had mentioned PT and possible evaluation for lymphedema pump.  She has tried elevating her legs.  Has tried compression hose - intolerance due to skin rash/irritation.  Discussed referral to vascular surgery for evaluation.       Relevant Orders   Ambulatory referral to Vascular Surgery   Internal nasal lesion    Bactroban.  If persistent will need ENT  evaluation.       Health care maintenance    Physical today 11/09/20.  Colonoscopy 07/2018 - diverticulosis and internal hemorrhoids.  Mammogram 10/02/20 - Birads II.  PAP 07/18/19 - negative with negative HPV (s/p hysterectomy).        GERD (gastroesophageal reflux disease)    No upper symptoms reported.  On nexium       Foot pain, right    Planning for surgery in 11/2020.  Reports no chest pain or sob.  No current infectious symptoms.  Will be evaluated if symptoms change prior to  surgery.        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  Being followed by oncology.  Completed XRT 03/2018. On arimidex.       Relevant Orders   Comprehensive metabolic panel (Completed)   TSH (Completed)   Chronic cystitis    Followed by urology.       Anemia    Check cbc today.       Relevant Orders   CBC with Differential/Platelet (Completed)    Other Visit Diagnoses    Screening cholesterol level       Relevant Orders   Lipid panel (Completed)       Einar Pheasant, MD

## 2020-11-10 ENCOUNTER — Encounter: Payer: Self-pay | Admitting: Internal Medicine

## 2020-11-10 DIAGNOSIS — M7989 Other specified soft tissue disorders: Secondary | ICD-10-CM | POA: Insufficient documentation

## 2020-11-10 DIAGNOSIS — I890 Lymphedema, not elsewhere classified: Secondary | ICD-10-CM | POA: Insufficient documentation

## 2020-11-10 MED ORDER — MUPIROCIN 2 % EX OINT: 1.0000 "application " | TOPICAL_OINTMENT | Freq: Two times a day (BID) | CUTANEOUS | 0 refills | Status: DC

## 2020-11-10 NOTE — Assessment & Plan Note (Signed)
Reports persistent increased swelling - left leg.  Has not been on her feet today - swelling not as bad currently, but has been a persistent problem.  Has increased spider veins.  Reviewed oncology note.  They had mentioned PT and possible evaluation for lymphedema pump.  She has tried elevating her legs.  Has tried compression hose - intolerance due to skin rash/irritation.  Discussed referral to vascular surgery for evaluation.

## 2020-11-10 NOTE — Assessment & Plan Note (Signed)
Check cbc today 

## 2020-11-10 NOTE — Assessment & Plan Note (Signed)
Bactroban.  If persistent will need ENT evaluation.

## 2020-11-10 NOTE — Assessment & Plan Note (Signed)
Increased stress as outlined.  Discussed wit her today.  Reports she is handling things relatively well.  Follow.

## 2020-11-10 NOTE — Assessment & Plan Note (Addendum)
S/p excision.  Being followed by oncology.  Completed XRT 03/2018. On arimidex.

## 2020-11-10 NOTE — Assessment & Plan Note (Signed)
Planning for surgery in 11/2020.  Reports no chest pain or sob.  No current infectious symptoms.  Will be evaluated if symptoms change prior to surgery.

## 2020-11-10 NOTE — Assessment & Plan Note (Signed)
No upper symptoms reported.  On nexium.   

## 2020-11-10 NOTE — Assessment & Plan Note (Addendum)
Physical today 11/09/20.  Colonoscopy 07/2018 - diverticulosis and internal hemorrhoids.  Mammogram 10/02/20 - Birads II.  PAP 07/18/19 - negative with negative HPV (s/p hysterectomy).

## 2020-11-10 NOTE — Assessment & Plan Note (Signed)
Followed by urology.   

## 2020-11-11 DIAGNOSIS — Z9889 Other specified postprocedural states: Secondary | ICD-10-CM

## 2020-11-11 HISTORY — DX: Other specified postprocedural states: Z98.890

## 2020-11-12 ENCOUNTER — Telehealth: Payer: Self-pay

## 2020-11-12 DIAGNOSIS — B079 Viral wart, unspecified: Secondary | ICD-10-CM

## 2020-11-12 NOTE — Telephone Encounter (Signed)
Pt would like a referral for the wart on hand she mentioned to Dr. Nicki Reaper. She said she forgot to ask for the referral then. She would like the dermatology referral to go to somewhere that is in [place].

## 2020-11-16 ENCOUNTER — Telehealth: Payer: Self-pay | Admitting: Podiatry

## 2020-11-16 ENCOUNTER — Encounter: Payer: Self-pay | Admitting: Physician Assistant

## 2020-11-16 ENCOUNTER — Ambulatory Visit: Payer: Self-pay | Admitting: Physician Assistant

## 2020-11-16 ENCOUNTER — Other Ambulatory Visit: Payer: Self-pay

## 2020-11-16 ENCOUNTER — Ambulatory Visit (INDEPENDENT_AMBULATORY_CARE_PROVIDER_SITE_OTHER): Payer: BC Managed Care – PPO | Admitting: Physician Assistant

## 2020-11-16 VITALS — BP 106/67 | HR 71 | Ht 67.0 in | Wt 130.0 lb

## 2020-11-16 DIAGNOSIS — R350 Frequency of micturition: Secondary | ICD-10-CM

## 2020-11-16 LAB — MICROSCOPIC EXAMINATION

## 2020-11-16 MED ORDER — LORAZEPAM 1 MG PO TABS: ORAL_TABLET | ORAL | 0 refills | Status: DC

## 2020-11-16 NOTE — Telephone Encounter (Addendum)
DOS: 12/01/2020  Procedure: Removal Fixation Deep Kwire/Screw Rt (20680)  BCBS Effective From: 12/13/2019 - 12/11/9998  Deductible: $1,500 with $0 met and $1,500 remaining. Out of Pocket: $3,300 with $0 met and $3,300 remaining. CoInsurance: 20% Copay: $0  Per Misty Prior Authorization is Required.  Call Reference # [id number]  [employer] Reference# [id number] valid from 12/01/2020 - 01/01/2021.

## 2020-11-16 NOTE — Patient Instructions (Addendum)
You may take prescribed Ativan 30-60 minutes prior to your scheduled Botox injection next week. Please make sure you bring a driver with you that day, as you should not operate heavy machinery on Ativan.

## 2020-11-16 NOTE — Progress Notes (Signed)
11/16/2020 3:04 PM   Rose Bernard October 19, 1966 [id number]  CC: Chief Complaint  Patient presents with  . Urinary Frequency    HPI: Rose Bernard is a 54 y.o. 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 Erlene Quan scheduled for next week, who presents today for pretreatment UA and culture.  I saw her in clinic most recently on 07/22/2019 following treatment of intravesical Botox.  At that point, she reported refractory symptoms despite recent Botox administration.  Patient reports today that her symptoms resolved on their own and in retrospect coincided with a Covid infection.  She believes Covid may have exacerbated her OAB symptoms.  She reports that she is back to baseline at this point, with her most bothersome urinary symptoms being urgency and urge incontinence.  She reports nocturia 2-3 times weekly as well as rare nocturnal enuresis.  She also has some stress incontinence.  In-office UA today pan negative; urine microscopy with 6-10 WBCs/HPF. PVR 75mL.  PMH: Past Medical History:  Diagnosis Date  . Chronic cystitis   . Frequent UTI   . GERD (gastroesophageal reflux disease)   . History of kidney stones   . Incomplete bladder emptying   . 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 ABDOMINAL HYSTERECTOMY  1995   2/2 delivery complications with 3rd child. Hysterectomy and bladder repair with subsequent appendectomy. L Ovary remain in place   . APPENDECTOMY     2001 due to scar tissue  . BLADDER BOTOX  2014, 2015, 2016  . BREAST BIOPSY Bilateral 1991  . BREAST BIOPSY Left 12/12/2019   [place] bx, path pending  . BREAST SURGERY Bilateral 2002   benign mass removed  . COLONOSCOPY  2014  . EXCISION OF BREAST BIOPSY Left 01/10/2020   Procedure: EXCISION OF BREAST BIOPSY w/ Needle localization;  Surgeon: Herbert Pun, MD;  Location: [facility]  ORS;  Service: General;  Laterality: Left;  . FOOT SURGERY Bilateral   . LAPAROSCOPIC HYSTERECTOMY  1995  . MASTECTOMY, PARTIAL Left 01/31/2020   Procedure: MASTECTOMY PARTIAL;  Surgeon: Herbert Pun, MD;  Location: [facility] ORS;  Service: General;  Laterality: Left;  . reconstruction of bladder   1995    2/2 delivery complications with 3rd child     Home Medications:  Allergies as of 11/16/2020      Reactions   Penicillin V Potassium Rash   Did it involve swelling of the face/tongue/throat, SOB, or low BP? No Did it involve sudden or severe rash/hives, skin peeling, or any reaction on the inside of your mouth or nose? No Did you need to seek medical attention at a hospital or doctor's office? No When did it last happen?25 years ago If all above answers are "NO", may proceed with cephalosporin use.      Medication List       Accurate as of November 16, 2020  3:04 PM. If you have any questions, ask your nurse or doctor.        anastrozole 1 MG tablet Commonly known as: ARIMIDEX Take 1 tablet (1 mg total) by mouth daily.   Cholecalciferol 25 MCG (1000 UT) capsule Take by mouth.   esomeprazole 20 MG capsule Commonly known as: NEXIUM Take 20 mg by mouth daily as needed (acid reflux).   ibuprofen 600 MG tablet Commonly known as: ADVIL Take 1 tablet (600 mg total) by mouth every 6 (six) hours as  needed.   LORazepam 1 MG tablet Commonly known as: Ativan Take dose 30-60 minutes prior to scheduled procedure. Started by: Debroah Loop, PA-C   mupirocin ointment 2 % Commonly known as: BACTROBAN Apply 1 application topically 2 (two) times daily.       Allergies:  Allergies  Allergen Reactions  . Penicillin V Potassium Rash    Did it involve swelling of the face/tongue/throat, SOB, or low BP? No Did it involve sudden or severe rash/hives, skin peeling, or any reaction on the inside of your mouth or nose? No Did you need to seek medical attention at a  hospital or doctor's office? No When did it last happen?25 years ago If all above answers are "NO", may proceed with cephalosporin use.     Family History: Family History  Problem Relation Age of Onset  . Colon cancer Father        With ostomy in place   . Breast cancer Paternal Aunt 57  . Breast cancer Maternal Aunt   . Breast cancer Maternal Aunt     Social History:   reports that she has never smoked. She has never used smokeless tobacco. She reports that she does not drink alcohol and does not use drugs.  Physical Exam: BP 106/67   Pulse 71   Ht 5\' 7"  (1.702 m)   Wt 130 lb (59 kg)   LMP 02/07/1994   BMI 20.36 kg/m   Constitutional:  Alert and oriented, no acute distress, nontoxic appearing HEENT: [place], AT Cardiovascular: No clubbing, cyanosis, or edema Respiratory: Normal respiratory effort, no increased work of breathing Skin: No rashes, bruises or suspicious lesions Neurologic: Grossly intact, no focal deficits, moving all 4 extremities Psychiatric: Normal mood and affect  Laboratory Data: Results for orders placed or performed in visit on 11/16/20  Microscopic Examination   Urine  Result Value Ref Range   WBC, UA 6-10 (A) 0 - 5 /hpf   RBC 0-2 0 - 2 /hpf   Epithelial Cells (non renal) 0-10 0 - 10 /hpf   Bacteria, UA None seen None seen/Few  Urinalysis, Complete  Result Value Ref Range   Specific Gravity, UA 1.010 1.005 - 1.030   pH, UA 6.0 5.0 - 7.5   Color, UA Yellow Yellow   Appearance Ur Clear Clear   Leukocytes,UA Negative Negative   Protein,UA Negative Negative/Trace   Glucose, UA Negative Negative   Ketones, UA Negative Negative   RBC, UA Negative Negative   Bilirubin, UA Negative Negative   Urobilinogen, Ur 0.2 0.2 - 1.0 mg/dL   Nitrite, UA Negative Negative   Microscopic Examination See below:    Assessment & Plan:   1. Urinary frequency UA benign today in advance of scheduled intravesical Botox next week.  Will send for culture for  further evaluation and treat as indicated.  Send Ativan 1 mg x 1 dose to her pharmacy and counseled patient to bring a driver on the day of treatment.  She expressed understanding. - Urinalysis, Complete - CULTURE, URINE COMPREHENSIVE - Bladder Scan (Post Void Residual) in office - LORazepam (ATIVAN) 1 MG tablet; Take dose 30-60 minutes prior to scheduled procedure.  Dispense: 1 tablet; Refill: 0  Return if symptoms worsen or fail to improve.  Debroah Loop, PA-C  [facility] [address] [place], [place] [address] [phone]

## 2020-11-17 LAB — URINALYSIS, COMPLETE
Bilirubin, UA: NEGATIVE
Glucose, UA: NEGATIVE
Ketones, UA: NEGATIVE
Leukocytes,UA: NEGATIVE
Nitrite, UA: NEGATIVE
Protein,UA: NEGATIVE
RBC, UA: NEGATIVE
Specific Gravity, UA: 1.01 (ref 1.005–1.030)
Urobilinogen, Ur: 0.2 mg/dL (ref 0.2–1.0)
pH, UA: 6 (ref 5.0–7.5)

## 2020-11-17 LAB — MICROSCOPIC EXAMINATION: Bacteria, UA: NONE SEEN

## 2020-11-17 NOTE — Telephone Encounter (Signed)
Order placed for dermatology referral.  Someone should contact her with appt date and time.

## 2020-11-17 NOTE — Addendum Note (Signed)
Addended by: Alisa Graff on: 11/17/2020 01:44 AM   Modules accepted: Orders

## 2020-11-17 NOTE — Addendum Note (Signed)
Addended by: Alisa Graff on: 11/17/2020 01:50 AM   Modules accepted: Orders

## 2020-11-19 LAB — CULTURE, URINE COMPREHENSIVE

## 2020-11-24 ENCOUNTER — Encounter: Payer: Self-pay | Admitting: Urology

## 2020-11-24 ENCOUNTER — Other Ambulatory Visit: Payer: Self-pay

## 2020-11-24 ENCOUNTER — Ambulatory Visit (INDEPENDENT_AMBULATORY_CARE_PROVIDER_SITE_OTHER): Payer: BC Managed Care – PPO | Admitting: Urology

## 2020-11-24 VITALS — BP 115/69 | HR 61

## 2020-11-24 DIAGNOSIS — N3281 Overactive bladder: Secondary | ICD-10-CM | POA: Diagnosis not present

## 2020-11-24 MED ORDER — CIPROFLOXACIN HCL 500 MG PO TABS
500.0000 mg | ORAL_TABLET | Freq: Once | ORAL | Status: AC
  Administered 2020-11-24: 500 mg via ORAL

## 2020-11-24 MED ORDER — LIDOCAINE HCL 2 % IJ SOLN
50.0000 mL | Freq: Once | INTRAMUSCULAR | Status: AC
  Administered 2020-11-24: 1000 mg

## 2020-11-24 MED ORDER — ONABOTULINUMTOXINA 100 UNITS IJ SOLR
100.0000 [IU] | Freq: Once | INTRAMUSCULAR | Status: AC
  Administered 2020-11-24: 100 [IU] via INTRAMUSCULAR

## 2020-11-24 MED ORDER — SODIUM CHLORIDE (PF) 0.9 % IJ SOLN
11.0000 mL | Freq: Once | INTRAMUSCULAR | Status: AC
  Administered 2020-11-24: 11 mL via INTRAVENOUS

## 2020-11-24 NOTE — Progress Notes (Signed)
   11/24/20  CC:  Chief Complaint  Patient presents with  . Botulinum Toxin Injection    HPI: 54 year old female with a personal history of severe refractory OAB managed with Botox who presents today for Botox injection.  Please see previous notes for details.  Preprocedural urine culture was negative.  She did receive p.o. antibiotics today for the procedure.  Lidocaine jelly was instilled into the bladder prior to the procedure allowed to dwell for 30 minutes.  Please see CMA note for details.  Blood pressure 102/64, pulse 71, height 5\' 6"  (1.676 m), weight 130 lb (59 kg), last menstrual period 02/07/1994. NED. A&Ox3.   No respiratory distress   Abd soft, NT, ND Normal external genitalia with patent urethral meatus  Cystoscopy Procedure Note  Patient identification was confirmed, informed consent was obtained, and patient was prepped using Betadine solution.  Lidocaine jelly was administered per urethral meatus.    Procedure: - Flexible cystoscope introduced, without any difficulty.   - Thorough search of the bladder revealed:    normal urethral meatus    normal urothelium    no stones    no ulcers     no tumors    no urethral polyps    no trabeculation  - Ureteral orifices were normal in position and appearance.  At this point in time, flexible beveled injectable needle was used through the working port at which time 2 rows of 10 injections were given just superior to the trigone along the posterior bladder wall delivering a total of 100 units of Botox.    Post-Procedure: - Patient tolerated the procedure well  Assessment/ Plan:  1. OAB (overactive bladder) Status post well-tolerated in office Botox injection to the bladder, 100 units  Post procedure warning symptoms reviewed  Given that she is tolerated this medication well in the past, will just have her follow-up in 6 months.  She will return back sooner as needed.  - lidocaine (XYLOCAINE) 2 % (with pres)  injection 1,200 mg - botulinum toxin Type A (BOTOX) injection 100 Units - ciprofloxacin (CIPRO) tablet 500 mg   Hollice Espy, MD

## 2020-11-24 NOTE — Addendum Note (Signed)
Addended by: Alvera Novel on: 11/24/2020 01:02 PM   Modules accepted: Orders

## 2020-11-24 NOTE — Progress Notes (Signed)
Bladder Instillation  Due to Botox instillation patient is present today for a Bladder Instillation of Lidocaine 2%. Patient was cleaned and prepped in a sterile fashion with betadine and lidocaine 2% jelly was instilled into the urethra.  A 16FR catheter was inserted, urine return was noted 5 ml, urine was yellow in color.  60 ml was instilled into the bladder. The catheter was then removed. Patient tolerated well, no complications were noted Patient held in bladder for 30 minutes prior to procedure starting.   Performed by: Kerman Passey, RMA  Follow up/ Additional notes: 2 weeks with PA for PVR

## 2020-11-25 LAB — URINALYSIS, COMPLETE
Bilirubin, UA: NEGATIVE
Glucose, UA: NEGATIVE
Ketones, UA: NEGATIVE
Leukocytes,UA: NEGATIVE
Nitrite, UA: NEGATIVE
Protein,UA: NEGATIVE
RBC, UA: NEGATIVE
Specific Gravity, UA: 1.025 (ref 1.005–1.030)
Urobilinogen, Ur: 0.2 mg/dL (ref 0.2–1.0)
pH, UA: 7 (ref 5.0–7.5)

## 2020-11-25 LAB — MICROSCOPIC EXAMINATION
Bacteria, UA: NONE SEEN
Epithelial Cells (non renal): NONE SEEN /hpf (ref 0–10)

## 2020-11-30 ENCOUNTER — Encounter (INDEPENDENT_AMBULATORY_CARE_PROVIDER_SITE_OTHER): Payer: BC Managed Care – PPO | Admitting: Vascular Surgery

## 2020-11-30 ENCOUNTER — Other Ambulatory Visit: Payer: Self-pay

## 2020-11-30 ENCOUNTER — Ambulatory Visit (INDEPENDENT_AMBULATORY_CARE_PROVIDER_SITE_OTHER): Payer: BC Managed Care – PPO | Admitting: Vascular Surgery

## 2020-11-30 ENCOUNTER — Encounter (INDEPENDENT_AMBULATORY_CARE_PROVIDER_SITE_OTHER): Payer: Self-pay | Admitting: Vascular Surgery

## 2020-11-30 VITALS — BP 109/63 | HR 69 | Resp 16 | Wt 138.0 lb

## 2020-11-30 DIAGNOSIS — K219 Gastro-esophageal reflux disease without esophagitis: Secondary | ICD-10-CM

## 2020-11-30 DIAGNOSIS — I872 Venous insufficiency (chronic) (peripheral): Secondary | ICD-10-CM | POA: Diagnosis not present

## 2020-11-30 DIAGNOSIS — I890 Lymphedema, not elsewhere classified: Secondary | ICD-10-CM

## 2020-11-30 NOTE — Progress Notes (Signed)
MRN : [id number]  Rose Bernard is a 54 y.o. (1966/05/21) female who presents with chief complaint of pain and swelling in my legs.  History of Present Illness:   Patient is seen for evaluation of leg swelling. The patient first noticed the swelling remotely but is now concerned because of a significant increase in the overall edema. The swelling is associated with pain and discoloration. The patient notes that in the morning the legs are significantly improved but they steadily worsened throughout the course of the day. Elevation makes the legs better, dependency makes them much worse.   6 months ago she experienced her first episode of STP at the left ankle.  Presenting symptom was pain and a hard knot.  This was confirmed by duplex and follow up study showed resolution.  The patient denies a history of DVT or PE.  There is no history of ulcerations associated with the swelling.   The patient denies any recent changes in their medications.  The patient has not been wearing graduated compression.  The patient has no had any past angiography, interventions or vascular surgery.  There is no history of primary lymphedema.  There is no history of radiation treatment to the groin or pelvis No history of malignancies. No history of trauma or groin or pelvic surgery. No history of foreign travel or parasitic infections area    No outpatient medications have been marked as taking for the 11/30/20 encounter (Appointment) with Delana Meyer, Dolores Lory, MD.    Past Medical History:  Diagnosis Date  . Chronic cystitis   . Frequent UTI   . GERD (gastroesophageal reflux disease)   . History of kidney stones   . Incomplete bladder emptying   . 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 ABDOMINAL HYSTERECTOMY  1995   2/2 delivery complications with 3rd child. Hysterectomy and bladder repair with subsequent appendectomy. L  Ovary remain in place   . APPENDECTOMY     2001 due to scar tissue  . BLADDER BOTOX  2014, 2015, 2016  . BREAST BIOPSY Bilateral 1991  . BREAST BIOPSY Left 12/12/2019   [place] bx, path pending  . BREAST SURGERY Bilateral 2002   benign mass removed  . COLONOSCOPY  2014  . EXCISION OF BREAST BIOPSY Left 01/10/2020   Procedure: EXCISION OF BREAST BIOPSY w/ Needle localization;  Surgeon: Herbert Pun, MD;  Location: [facility];  Service: General;  Laterality: Left;  . FOOT SURGERY Bilateral   . LAPAROSCOPIC HYSTERECTOMY  1995  . MASTECTOMY, PARTIAL Left 01/31/2020   Procedure: MASTECTOMY PARTIAL;  Surgeon: Herbert Pun, MD;  Location: [facility];  Service: General;  Laterality: Left;  . reconstruction of bladder   1995    2/2 delivery complications with 3rd child     Social History Social History   Tobacco Use  . Smoking status: Never Smoker  . Smokeless tobacco: Never Used  Vaping Use  . Vaping Use: Never used  Substance Use Topics  . Alcohol use: No    Alcohol/week: 0.0 standard drinks  . Drug use: No    Family History Family History  Problem Relation Age of Onset  . Colon cancer Father        With ostomy in place   . Breast cancer Paternal Aunt 69  . Breast cancer Maternal Aunt   . Breast cancer Maternal Aunt   No family history of bleeding/clotting disorders, porphyria or autoimmune disease  Allergies  Allergen Reactions  . Penicillin V Potassium Rash    Did it involve swelling of the face/tongue/throat, SOB, or low BP? No Did it involve sudden or severe rash/hives, skin peeling, or any reaction on the inside of your mouth or nose? No Did you need to seek medical attention at a hospital or doctor's office? No When did it last happen?25 years ago If all above answers are "NO", may proceed with cephalosporin use.      REVIEW OF SYSTEMS (Negative unless checked)  Constitutional: [] Weight loss  [] Fever  [] Chills Cardiac: [] Chest pain   [] Chest  pressure   [] Palpitations   [] Shortness of breath when laying flat   [] Shortness of breath with exertion. Vascular:  [] Pain in legs with walking   [] Pain in legs at rest  [] History of DVT   [] Phlebitis   [x] Swelling in legs   [x] Varicose veins   [] Non-healing ulcers Pulmonary:   [] Uses home oxygen   [] Productive cough   [] Hemoptysis   [] Wheeze  [] COPD   [] Asthma Neurologic:  [] Dizziness   [] Seizures   [] History of stroke   [] History of TIA  [] Aphasia   [] Vissual changes   [] Weakness or numbness in arm   [] Weakness or numbness in leg Musculoskeletal:   [] Joint swelling   [x] Joint pain   [] Low back pain Hematologic:  [] Easy bruising  [] Easy bleeding   [] Hypercoagulable state   [] Anemic Gastrointestinal:  [] Diarrhea   [] Vomiting  [x] Gastroesophageal reflux/heartburn   [] Difficulty swallowing. Genitourinary:  [] Chronic kidney disease   [] Difficult urination  [] Frequent urination   [] Blood in urine Skin:  [] Rashes   [] Ulcers  Psychological:  [] History of anxiety   []  History of major depression.  Physical Examination  There were no vitals filed for this visit. There is no height or weight on file to calculate BMI. Gen: WD/WN, NAD Head: [place]/AT, No temporalis wasting.  Ear/Nose/Throat: Hearing grossly intact, nares w/o erythema or drainage, poor dentition Eyes: PER, EOMI, sclera nonicteric.  Neck: Supple, no masses.  No bruit or JVD.  Pulmonary:  Good air movement, clear to auscultation bilaterally, no use of accessory muscles.  Cardiac: RRR, normal S1, S2, no Murmurs. Vascular: scattered varicosities present bilaterally.  Mild venous stasis changes to the legs bilaterally.  2+ soft pitting edema Vessel Right Left  Radial Palpable Palpable  Gastrointestinal: soft, non-distended. No guarding/no peritoneal signs.  Musculoskeletal: M/S 5/5 throughout.  No deformity or atrophy.  Neurologic: CN 2-12 intact. Pain and light touch intact in extremities.  Symmetrical.  Speech is fluent. Motor exam as  listed above. Psychiatric: Judgment intact, Mood & affect appropriate for pt's clinical situation. Dermatologic: Venous rashes no ulcers noted.  No changes consistent with cellulitis.   CBC Lab Results  Component Value Date   WBC 4.8 11/09/2020   HGB 13.3 11/09/2020   HCT 39.8 11/09/2020   MCV 90.1 11/09/2020   PLT 259.0 11/09/2020    BMET    Component Value Date/Time   NA 141 11/09/2020 1147   K 4.0 11/09/2020 1147   CL 104 11/09/2020 1147   CO2 32 11/09/2020 1147   GLUCOSE 81 11/09/2020 1147   BUN 15 11/09/2020 1147   BUN 15 09/02/2015 0000   CREATININE 0.71 11/09/2020 1147   CALCIUM 9.2 11/09/2020 1147   GFRNONAA >60 04/25/2020 1909   GFRAA >60 04/25/2020 1909   Estimated Creatinine Clearance: 74.9 mL/min (by C-G formula based on SCr of 0.71 mg/dL).  COAG No results found for: INR, PROTIME  Radiology No  results found.   Assessment/Plan 1. Lymphedema Recommend:  The patient is complaining of varicose veins.    I have had a long discussion with the patient regarding  varicose veins and why they cause symptoms.  Patient will begin wearing graduated compression stockings on a daily basis, beginning first thing in the morning and removing them in the evening. The patient is instructed specifically not to sleep in the stockings.    The patient  will also begin using over-the-counter analgesics such as Motrin 600 mg po TID to help control the symptoms as needed.    In addition, behavioral modification including elevation during the day will be initiated, utilizing a recliner was recommended.  The patient is also instructed to continue exercising such as walking 4-5 times per week.  At this time the patient wishes to continue conservative therapy and is not interested in more invasive treatments such as laser ablation and sclerotherapy.  The Patient will follow up PRN if the symptoms worsen.  2. Chronic venous insufficiency Recommend:  The patient is complaining  of varicose veins.    I have had a long discussion with the patient regarding  varicose veins and why they cause symptoms.  Patient will begin wearing graduated compression stockings on a daily basis, beginning first thing in the morning and removing them in the evening. The patient is instructed specifically not to sleep in the stockings.    The patient  will also begin using over-the-counter analgesics such as Motrin 600 mg po TID to help control the symptoms as needed.    In addition, behavioral modification including elevation during the day will be initiated, utilizing a recliner was recommended.  The patient is also instructed to continue exercising such as walking 4-5 times per week.  At this time the patient wishes to continue conservative therapy and is not interested in more invasive treatments such as laser ablation and sclerotherapy.  The Patient will follow up PRN if the symptoms worsen.  3. Gastroesophageal reflux disease, unspecified whether esophagitis present Continue PPI as already ordered, this medication has been reviewed and there are no changes at this time.  Avoidence of caffeine and alcohol  Moderate elevation of the head of the bed     Hortencia Pilar, MD  11/30/2020 11:32 AM

## 2020-12-01 ENCOUNTER — Other Ambulatory Visit: Payer: Self-pay | Admitting: Podiatry

## 2020-12-01 DIAGNOSIS — M79676 Pain in unspecified toe(s): Secondary | ICD-10-CM

## 2020-12-01 DIAGNOSIS — Z4889 Encounter for other specified surgical aftercare: Secondary | ICD-10-CM | POA: Diagnosis not present

## 2020-12-01 DIAGNOSIS — T8484XA Pain due to internal orthopedic prosthetic devices, implants and grafts, initial encounter: Secondary | ICD-10-CM | POA: Diagnosis not present

## 2020-12-01 MED ORDER — IBUPROFEN 600 MG PO TABS: 600.0000 mg | ORAL_TABLET | Freq: Four times a day (QID) | ORAL | 0 refills | Status: AC | PRN

## 2020-12-01 MED ORDER — ACETAMINOPHEN 500 MG PO TABS: 1000.0000 mg | ORAL_TABLET | Freq: Four times a day (QID) | ORAL | 0 refills | Status: AC | PRN

## 2020-12-01 MED ORDER — TRAMADOL HCL 50 MG PO TABS: 50.0000 mg | ORAL_TABLET | Freq: Four times a day (QID) | ORAL | 0 refills | Status: AC | PRN

## 2020-12-01 NOTE — Progress Notes (Unsigned)
12/01/20 [place] hardware removal of right foot

## 2020-12-07 ENCOUNTER — Other Ambulatory Visit: Payer: Self-pay

## 2020-12-07 ENCOUNTER — Ambulatory Visit (INDEPENDENT_AMBULATORY_CARE_PROVIDER_SITE_OTHER): Payer: BC Managed Care – PPO

## 2020-12-07 ENCOUNTER — Encounter: Payer: Self-pay | Admitting: Podiatry

## 2020-12-07 ENCOUNTER — Ambulatory Visit (INDEPENDENT_AMBULATORY_CARE_PROVIDER_SITE_OTHER): Payer: BC Managed Care – PPO | Admitting: Podiatry

## 2020-12-07 DIAGNOSIS — Z9889 Other specified postprocedural states: Secondary | ICD-10-CM | POA: Diagnosis not present

## 2020-12-07 DIAGNOSIS — Z969 Presence of functional implant, unspecified: Secondary | ICD-10-CM

## 2020-12-07 NOTE — Progress Notes (Signed)
  Subjective:  Patient ID: Rose Bernard, female    DOB: [birth year],  MRN: [id number]  Chief Complaint  Patient presents with  . Routine Post Op    POV #1 DOS 12/01/2020 REMOVAL OF PONS FROM RIGHT FOOT   "Im doing well, still a little sore, but no pain.  Now I have pain in my heel"    DOS: 11/30/20 Procedure: Removal of hardware from right foot  54 y.o. female returns for post-op check.  Feeling well.  Minimal pain.  Review of Systems: Negative except as noted in the HPI. Denies N/V/F/Ch.   Objective:  There were no vitals filed for this visit. There is no height or weight on file to calculate BMI. Constitutional Well developed. Well nourished.  Vascular Foot warm and well perfused. Capillary refill normal to all digits.   Neurologic Normal speech. Oriented to person, place, and time. Epicritic sensation to light touch grossly present bilaterally.  Dermatologic Skin healing well without signs of infection. Skin edges well coapted without signs of infection.  Orthopedic: Tenderness to palpation noted about the surgical site.   Radiographs: Interval removal of Kirschner wires from right foot Assessment:   1. Retained orthopedic hardware   2. S/P foot surgery, right    Plan:  Patient was evaluated and treated and all questions answered.  S/p foot surgery right -Progressing as expected post-operatively. -XR: Consistent with postoperative changes -WB Status: WBAT in surgical shoe -Sutures: We will remove at next visit. -Medications: No refills required -Foot redressed.  No follow-ups on file.

## 2020-12-09 ENCOUNTER — Other Ambulatory Visit: Payer: Self-pay

## 2020-12-09 ENCOUNTER — Ambulatory Visit (INDEPENDENT_AMBULATORY_CARE_PROVIDER_SITE_OTHER): Payer: BC Managed Care – PPO | Admitting: Physician Assistant

## 2020-12-09 ENCOUNTER — Encounter: Payer: Self-pay | Admitting: Physician Assistant

## 2020-12-09 VITALS — BP 115/72 | HR 76 | Ht 67.0 in | Wt 139.0 lb

## 2020-12-09 DIAGNOSIS — N3281 Overactive bladder: Secondary | ICD-10-CM | POA: Diagnosis not present

## 2020-12-09 LAB — BLADDER SCAN AMB NON-IMAGING: Scan Result: 0 mL

## 2020-12-09 NOTE — Progress Notes (Signed)
12/09/2020 11:24 AM   [name] 11-Feb-1966 [id number]  CC: Chief Complaint  Patient presents with  . Urinary Frequency  . Follow-up    HPI: Rose Bernard is a 54 y.o. 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 Erlene Quan, who presents today for symptom check and PVR.  Today she reports some low back discomfort and double voiding but denies dysuria.  She is unable to urinate today to provide a sample but states her symptoms are resolving on their own.  PVR 0 mL.  PMH: Past Medical History:  Diagnosis Date  . Chronic cystitis   . Frequent UTI   . GERD (gastroesophageal reflux disease)   . History of kidney stones   . Incomplete bladder emptying   . 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 ABDOMINAL HYSTERECTOMY  1995   2/2 delivery complications with 3rd child. Hysterectomy and bladder repair with subsequent appendectomy. L Ovary remain in place   . APPENDECTOMY     2001 due to scar tissue  . BLADDER BOTOX  2014, 2015, 2016  . BREAST BIOPSY Bilateral 1991  . BREAST BIOPSY Left 12/12/2019   [place] bx, path pending  . BREAST SURGERY Bilateral 2002   benign mass removed  . COLONOSCOPY  2014  . EXCISION OF BREAST BIOPSY Left 01/10/2020   Procedure: EXCISION OF BREAST BIOPSY w/ Needle localization;  Surgeon: Herbert Pun, MD;  Location: [facility];  Service: General;  Laterality: Left;  . FOOT SURGERY Bilateral   . LAPAROSCOPIC HYSTERECTOMY  1995  . MASTECTOMY, PARTIAL Left 01/31/2020   Procedure: MASTECTOMY PARTIAL;  Surgeon: Herbert Pun, MD;  Location: [facility];  Service: General;  Laterality: Left;  . reconstruction of bladder   1995    2/2 delivery complications with 3rd child     Home Medications:  Allergies as of 12/09/2020      Reactions   Penicillin V Potassium Rash   Did it involve  swelling of the face/tongue/throat, SOB, or low BP? No Did it involve sudden or severe rash/hives, skin peeling, or any reaction on the inside of your mouth or nose? No Did you need to seek medical attention at a hospital or doctor's office? No When did it last happen?25 years ago If all above answers are "NO", may proceed with cephalosporin use.      Medication List       Accurate as of December 09, 2020 11:24 AM. If you have any questions, ask your nurse or doctor.        acetaminophen 500 MG tablet Commonly known as: TYLENOL Take 2 tablets (1,000 mg total) by mouth every 6 (six) hours as needed for up to 14 days (pain).   anastrozole 1 MG tablet Commonly known as: ARIMIDEX Take 1 tablet (1 mg total) by mouth daily.   Cholecalciferol 25 MCG (1000 UT) capsule Take by mouth.   esomeprazole 20 MG capsule Commonly known as: NEXIUM Take 20 mg by mouth daily as needed (acid reflux).   ibuprofen 600 MG tablet Commonly known as: ADVIL Take 1 tablet (600 mg total) by mouth every 6 (six) hours as needed for up to 14 days (pain).   LORazepam 1 MG tablet Commonly known as: Ativan Take dose 30-60 minutes prior to scheduled procedure.   mupirocin ointment 2 % Commonly known as: BACTROBAN Apply 1 application topically 2 (two) times  daily.       Allergies:  Allergies  Allergen Reactions  . Penicillin V Potassium Rash    Did it involve swelling of the face/tongue/throat, SOB, or low BP? No Did it involve sudden or severe rash/hives, skin peeling, or any reaction on the inside of your mouth or nose? No Did you need to seek medical attention at a hospital or doctor's office? No When did it last happen?25 years ago If all above answers are "NO", may proceed with cephalosporin use.     Family History: Family History  Problem Relation Age of Onset  . Colon cancer Father        With ostomy in place   . Breast cancer Paternal Aunt 70  . Breast cancer Maternal Aunt    . Breast cancer Maternal Aunt     Social History:   reports that she has never smoked. She has never used smokeless tobacco. She reports that she does not drink alcohol and does not use drugs.  Physical Exam: BP 115/72 (BP Location: Left Arm, Patient Position: Sitting, Cuff Size: Normal)   Pulse 76   Ht 5\' 7"  (1.702 m)   Wt 139 lb (63 kg)   LMP 02/07/1994   BMI 21.77 kg/m   Constitutional:  Alert and oriented, no acute distress, nontoxic appearing HEENT: [place], AT Cardiovascular: No clubbing, cyanosis, or edema Respiratory: Normal respiratory effort, no increased work of breathing Skin: No rashes, bruises or suspicious lesions Neurologic: Grossly intact, no focal deficits, moving all 4 extremities Psychiatric: Normal mood and affect  Laboratory Data: Results for orders placed or performed in visit on 12/09/20  Bladder Scan (Post Void Residual) in office  Result Value Ref Range   Scan Result 0 mL   Assessment & Plan:   1. OAB (overactive bladder) Incomplete bladder emptying 2 weeks after her most recent intravesical Botox administration.  No indication to self cath at this time.  Patient to drop off a UA in clinic later this week with persistent UTI concerns.  We will plan for symptom recheck in 5 months. - Bladder Scan (Post Void Residual) in office   Return in about 5 months (around 05/09/2021) for Symptom recheck and Botox planning.  05/11/2021, PA-C  [facility], [facility] [address] [place], [place] [phone]

## 2020-12-16 ENCOUNTER — Encounter: Payer: Self-pay | Admitting: Podiatry

## 2020-12-16 ENCOUNTER — Encounter: Payer: BC Managed Care – PPO | Admitting: Podiatry

## 2020-12-16 ENCOUNTER — Other Ambulatory Visit: Payer: Self-pay

## 2020-12-16 ENCOUNTER — Ambulatory Visit (INDEPENDENT_AMBULATORY_CARE_PROVIDER_SITE_OTHER): Payer: BC Managed Care – PPO | Admitting: Podiatry

## 2020-12-16 DIAGNOSIS — M25871 Other specified joint disorders, right ankle and foot: Secondary | ICD-10-CM

## 2020-12-16 DIAGNOSIS — Z9889 Other specified postprocedural states: Secondary | ICD-10-CM

## 2020-12-16 DIAGNOSIS — Z969 Presence of functional implant, unspecified: Secondary | ICD-10-CM

## 2020-12-16 MED ORDER — MELOXICAM 15 MG PO TABS: 15.0000 mg | ORAL_TABLET | Freq: Every day | ORAL | 3 refills | Status: DC

## 2020-12-16 NOTE — Progress Notes (Signed)
  Subjective:  Patient ID: Rose Bernard, female    DOB: [birth year],  MRN: [id number]  Chief Complaint  Patient presents with  . Routine Post Op    Patient denies N/V, fever, chills or pain at this time. Patient reports some soreness in the area but reports nothing major. Patient denies any concerns at this time.    DOS: 11/30/20 Procedure: Removal of hardware from right foot  55 y.o. female returns for post-op check.  Feeling well.  Most of the pain she is having is now in the plantar first MTPJ.  This is a newer type pain.  Review of Systems: Negative except as noted in the HPI. Denies N/V/F/Ch.   Objective:  There were no vitals filed for this visit. There is no height or weight on file to calculate BMI. Constitutional Well developed. Well nourished.  Vascular Foot warm and well perfused. Capillary refill normal to all digits.   Neurologic Normal speech. Oriented to person, place, and time. Epicritic sensation to light touch grossly present bilaterally.  Dermatologic Skin healing well without signs of infection. Skin edges well coapted without signs of infection.  Orthopedic: Tenderness to palpation noted about the surgical site.  She also has pain sharply on the fibular sesamoid.  None the tibial sesamoid if this is worse with hallux range of motion.   Radiographs: Interval removal of Kirschner wires from right foot Assessment:   1. Retained orthopedic hardware   2. S/P foot surgery, right   3. Sesamoiditis of right foot    Plan:  Patient was evaluated and treated and all questions answered.  S/p foot surgery right -Removed today -Resume regular activity and bathing   She does have persistent plantar pain now which seems to be consistent with fibular sesamoiditis.  Reviewed etiology and treatment options for this including immobilization in CAM boot.  She would like to continue working for now she has to wear steel toed shoes.  If she is not better with this by the  next visit we should consider CAM boot immobilization MRI to evaluate now that the hardware is out.  Return in about 6 weeks (around 01/27/2021).

## 2020-12-22 ENCOUNTER — Telehealth: Payer: Self-pay | Admitting: Physician Assistant

## 2020-12-22 NOTE — Telephone Encounter (Signed)
Patient had bladder Botox on 11/24/20 with Dr. Erlene Quan.  Since the procedure she has been experiencing new bladder issues (urinary leakage and night time incontinence.)  I added her to Sam's schedule on Thursday at the patient's request.  Okay per Dr. Erlene Quan to bring patient in for a PVR.

## 2020-12-24 ENCOUNTER — Other Ambulatory Visit: Payer: Self-pay

## 2020-12-24 ENCOUNTER — Encounter: Payer: Self-pay | Admitting: Physician Assistant

## 2020-12-24 ENCOUNTER — Ambulatory Visit
Admission: RE | Admit: 2020-12-24 | Discharge: 2020-12-24 | Disposition: A | Discharge status: Home / Self Care | Payer: BC Managed Care – PPO | Source: Ambulatory Visit | Attending: Physician Assistant | Admitting: Physician Assistant

## 2020-12-24 ENCOUNTER — Ambulatory Visit (INDEPENDENT_AMBULATORY_CARE_PROVIDER_SITE_OTHER): Payer: BC Managed Care – PPO | Admitting: Physician Assistant

## 2020-12-24 VITALS — BP 104/68 | HR 76 | Temp 98.0°F | Ht 67.0 in | Wt 139.0 lb

## 2020-12-24 DIAGNOSIS — N1330 Unspecified hydronephrosis: Secondary | ICD-10-CM | POA: Diagnosis not present

## 2020-12-24 DIAGNOSIS — N3941 Urge incontinence: Secondary | ICD-10-CM | POA: Diagnosis not present

## 2020-12-24 DIAGNOSIS — Z87442 Personal history of urinary calculi: Secondary | ICD-10-CM

## 2020-12-24 DIAGNOSIS — R109 Unspecified abdominal pain: Secondary | ICD-10-CM | POA: Insufficient documentation

## 2020-12-24 LAB — BLADDER SCAN AMB NON-IMAGING: Scan Result: 11

## 2020-12-24 NOTE — Progress Notes (Signed)
12/24/2020 3:15 PM   [name] 1966-10-07 [id number]  CC: Chief Complaint  Patient presents with  . Urinary Incontinence   HPI: Rose Bernard is a 55 y.o. 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 Erlene Quan on 11/24/2020, who presents today for evaluation of worsened urinary symptoms and flank pain.  Today she reports worsening lower abdominal pressure and nocturnal enuresis following her most recent Botox treatment.  She has also had a 1 week history of bilateral 8 out of 10 aching flank pain.  She reports pain shooting down her legs without saddle anesthesia or bowel incontinence.  She has taken Tylenol at home, which did not help her symptoms.  She denies a history of back problems.  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 She also has a history of nephrolithiasis, having spontaneously passed a stone around 2018 with no known residual stones.  Lastly, she has a history of chronic cystitis versus recurrent UTI without a recent history of these.  In-office UA and microscopy today pan negative. PVR 61mL.  PMH: Past Medical History:  Diagnosis Date  . Chronic cystitis   . Frequent UTI   . GERD (gastroesophageal reflux disease)   . History of kidney stones   . Incomplete bladder emptying   . 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 ABDOMINAL HYSTERECTOMY  1995   2/2 delivery complications with 3rd child. Hysterectomy and bladder repair with subsequent appendectomy. L Ovary remain in place   . APPENDECTOMY     2001 due to scar tissue  . BLADDER BOTOX  2014, 2015, 2016  . BREAST BIOPSY Bilateral 1991  . BREAST BIOPSY Left 12/12/2019   [place] bx, path pending  . BREAST SURGERY Bilateral 2002   benign mass removed  . COLONOSCOPY  2014  . EXCISION OF BREAST BIOPSY Left 01/10/2020   Procedure:  EXCISION OF BREAST BIOPSY w/ Needle localization;  Surgeon: Herbert Pun, MD;  Location: [facility] ORS;  Service: General;  Laterality: Left;  . FOOT SURGERY Bilateral   . LAPAROSCOPIC HYSTERECTOMY  1995  . MASTECTOMY, PARTIAL Left 01/31/2020   Procedure: MASTECTOMY PARTIAL;  Surgeon: Herbert Pun, MD;  Location: [facility] ORS;  Service: General;  Laterality: Left;  . reconstruction of bladder   1995    2/2 delivery complications with 3rd child     Home Medications:  Allergies as of 12/24/2020      Reactions   Penicillin V Potassium Rash   Did it involve swelling of the face/tongue/throat, SOB, or low BP? No Did it involve sudden or severe rash/hives, skin peeling, or any reaction on the inside of your mouth or nose? No Did you need to seek medical attention at a hospital or doctor's office? No When did it last happen?25 years ago If all above answers are "NO", may proceed with cephalosporin use.      Medication List       Accurate as of December 24, 2020  3:15 PM. If you have any questions, ask your nurse or doctor.        anastrozole 1 MG tablet Commonly known as: ARIMIDEX Take 1 tablet (1 mg total) by mouth daily.   Cholecalciferol 25 MCG (1000 UT) capsule Take by mouth.   esomeprazole 20 MG capsule Commonly known as: NEXIUM Take 20 mg by mouth daily as needed (acid  reflux).   LORazepam 1 MG tablet Commonly known as: Ativan Take dose 30-60 minutes prior to scheduled procedure.   meloxicam 15 MG tablet Commonly known as: Mobic Take 1 tablet (15 mg total) by mouth daily.   mupirocin ointment 2 % Commonly known as: BACTROBAN Apply 1 application topically 2 (two) times daily.       Allergies:  Allergies  Allergen Reactions  . Penicillin V Potassium Rash    Did it involve swelling of the face/tongue/throat, SOB, or low BP? No Did it involve sudden or severe rash/hives, skin peeling, or any reaction on the inside of your mouth or nose? No Did you  need to seek medical attention at a hospital or doctor's office? No When did it last happen?25 years ago If all above answers are "NO", may proceed with cephalosporin use.     Family History: Family History  Problem Relation Age of Onset  . Colon cancer Father        With ostomy in place   . Breast cancer Paternal Aunt 70  . Breast cancer Maternal Aunt   . Breast cancer Maternal Aunt     Social History:   reports that she has never smoked. She has never used smokeless tobacco. She reports that she does not drink alcohol and does not use drugs.  Physical Exam: BP 104/68   Pulse 76   Temp 98 F (36.7 C)   Ht 5\' 7"  (1.702 m)   Wt 139 lb (63 kg)   LMP 02/07/1994   BMI 21.77 kg/m   Constitutional:  Alert and oriented, no acute distress, nontoxic appearing HEENT: [place], AT Cardiovascular: No clubbing, cyanosis, or edema Respiratory: Normal respiratory effort, no increased work of breathing Skin: No rashes, bruises or suspicious lesions GU: No CVA tenderness MSK: Point tenderness over the lumbar spine Neurologic: Grossly intact, no focal deficits, moving all 4 extremities Psychiatric: Normal mood and affect  Laboratory Data: Results for orders placed or performed in visit on 12/24/20  Microscopic Examination   Urine  Result Value Ref Range   WBC, UA 0-5 0 - 5 /hpf   RBC None seen 0 - 2 /hpf   Epithelial Cells (non renal) 0-10 0 - 10 /hpf   Bacteria, UA None seen None seen/Few  Urinalysis, Complete  Result Value Ref Range   Specific Gravity, UA 1.010 1.005 - 1.030   pH, UA 5.5 5.0 - 7.5   Color, UA Yellow Yellow   Appearance Ur Clear Clear   Leukocytes,UA Negative Negative   Protein,UA Negative Negative/Trace   Glucose, UA Negative Negative   Ketones, UA Negative Negative   RBC, UA Negative Negative   Bilirubin, UA Negative Negative   Urobilinogen, Ur 0.2 0.2 - 1.0 mg/dL   Nitrite, UA Negative Negative   Microscopic Examination See below:   Bladder Scan (Post  Void Residual) in office  Result Value Ref Range   Scan Result 11    Assessment & Plan:   1. Urge incontinence Recent worsening despite intravesical Botox less than 4 weeks ago. PVR WNL today, UA benign. Will send for culture per patient preference, though in the absence of inflammatory signs on UA, any growth likely represents colonization rather than infection. - Bladder Scan (Post Void Residual) in office - Urinalysis, Complete - CULTURE, URINE COMPREHENSIVE  2. Flank pain with history of urolithiasis Likely MSK given bony point tenderness and radiating pain down the BLEs, however will obtain renal US today due to her history of VUR and  nephrolithiasis. No saddle anesthesia or bowel incontinence today, reassuring for cauda equina syndrome. If [place] is positive for hydronephrosis, recommend CT stone for further evaluation. If negative, unlikely urologic source of her pain and recommend repeat intravesical Botox to manage her symptoms above. - US RENAL; Future   Return for Will call with results.   I spent 35 minutes on the day of the encounter to include pre-visit record review, face-to-face time with the patient, and post-visit ordering of tests.   Debroah Loop, PA-C  [facility] [address] [place], [place] [address] [phone]

## 2020-12-25 ENCOUNTER — Telehealth: Payer: Self-pay | Admitting: Physician Assistant

## 2020-12-25 ENCOUNTER — Ambulatory Visit
Admission: RE | Admit: 2020-12-25 | Discharge: 2020-12-25 | Disposition: A | Discharge status: Home / Self Care | Payer: BC Managed Care – PPO | Source: Ambulatory Visit | Attending: Physician Assistant | Admitting: Physician Assistant

## 2020-12-25 DIAGNOSIS — N1330 Unspecified hydronephrosis: Secondary | ICD-10-CM | POA: Diagnosis not present

## 2020-12-25 DIAGNOSIS — Z9049 Acquired absence of other specified parts of digestive tract: Secondary | ICD-10-CM | POA: Diagnosis not present

## 2020-12-25 DIAGNOSIS — Z90710 Acquired absence of both cervix and uterus: Secondary | ICD-10-CM | POA: Diagnosis not present

## 2020-12-25 DIAGNOSIS — N281 Cyst of kidney, acquired: Secondary | ICD-10-CM | POA: Diagnosis not present

## 2020-12-25 LAB — URINALYSIS, COMPLETE
Bilirubin, UA: NEGATIVE
Glucose, UA: NEGATIVE
Ketones, UA: NEGATIVE
Leukocytes,UA: NEGATIVE
Nitrite, UA: NEGATIVE
Protein,UA: NEGATIVE
RBC, UA: NEGATIVE
Specific Gravity, UA: 1.01 (ref 1.005–1.030)
Urobilinogen, Ur: 0.2 mg/dL (ref 0.2–1.0)
pH, UA: 5.5 (ref 5.0–7.5)

## 2020-12-25 LAB — MICROSCOPIC EXAMINATION
Bacteria, UA: NONE SEEN
RBC, Urine: NONE SEEN /hpf (ref 0–2)

## 2020-12-25 NOTE — Telephone Encounter (Signed)
Multiple unsuccessful attempts made to reach the patient and her spouse via telephone this morning to discuss her renal US results and plan for CT. LMOM to return my call. Per imaging, they have also been unable to reach her to schedule CT.

## 2020-12-25 NOTE — Telephone Encounter (Signed)
Patient's renal ultrasound revealed mild right hydronephrosis and an absent right ureteral jet.  Based on these findings, I have ordered a stat CT stone study for further evaluation of possible obstructive process.  Please complete prior authorization for this and notify the patient.

## 2020-12-25 NOTE — Telephone Encounter (Signed)
I spoke with the patient this afternoon, shared her RUS results, and counseled her to contact the imaging department to schedule her STAT CT stone study. She expressed understanding.

## 2020-12-28 IMAGING — US US ABDOMEN COMPLETE
1 series · 14 of 25 positions shown · non-contrast
Comparison: CT abdomen pelvis 09/29/2015

CLINICAL DATA: Abdominal pain, RIGHT upper quadrant pain for 1
month

EXAM:
ABDOMEN ULTRASOUND COMPLETE

[Series 1: us abdomen complete · 14 of 105 slices shown]
[im 1/105]
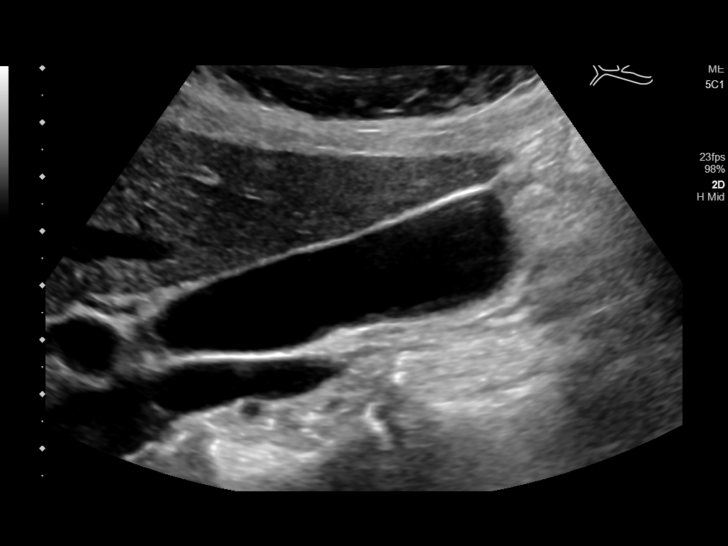
[im 9/105]
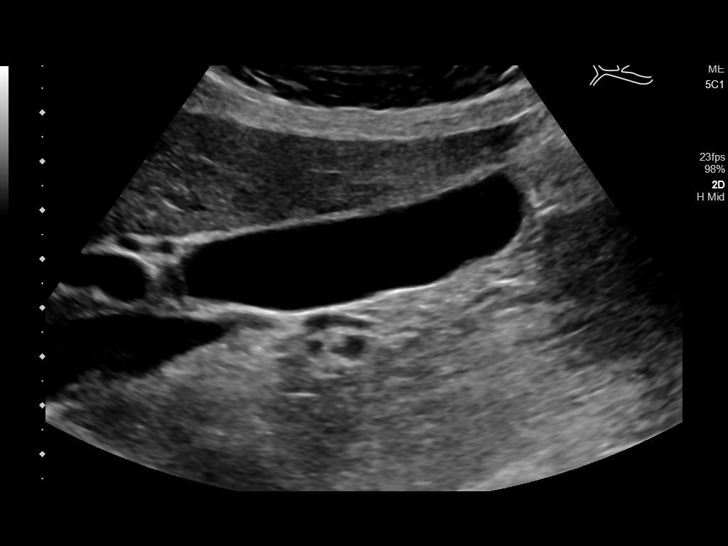
[im 18/105]
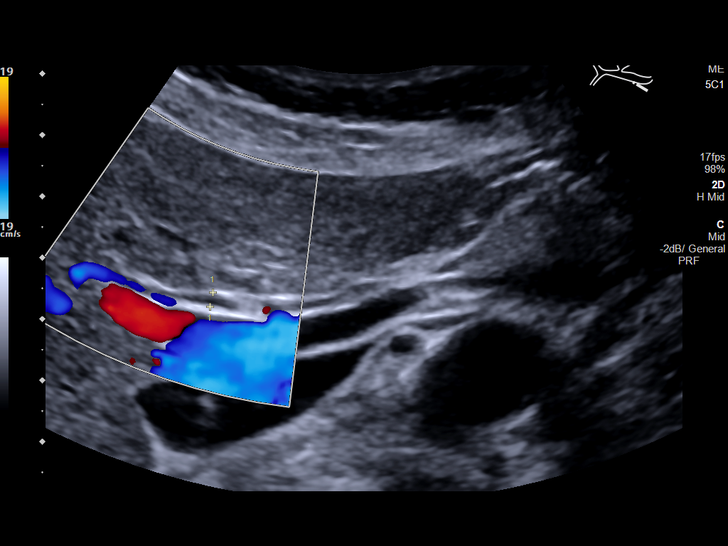
[im 27/105]
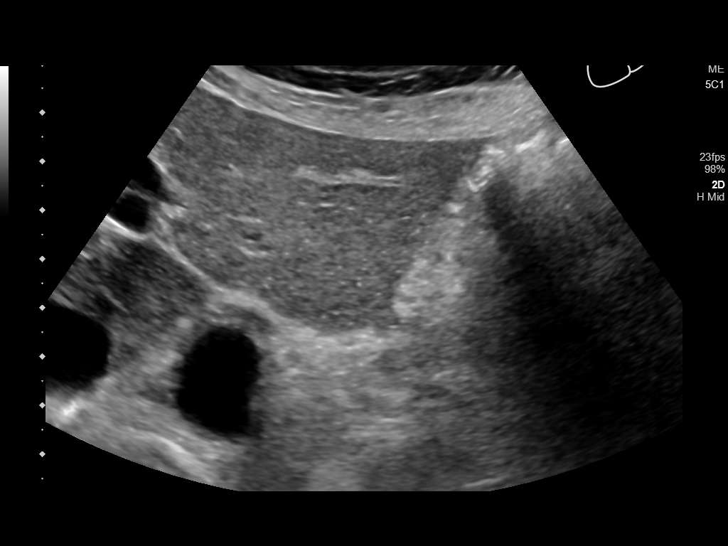
[im 35/105]
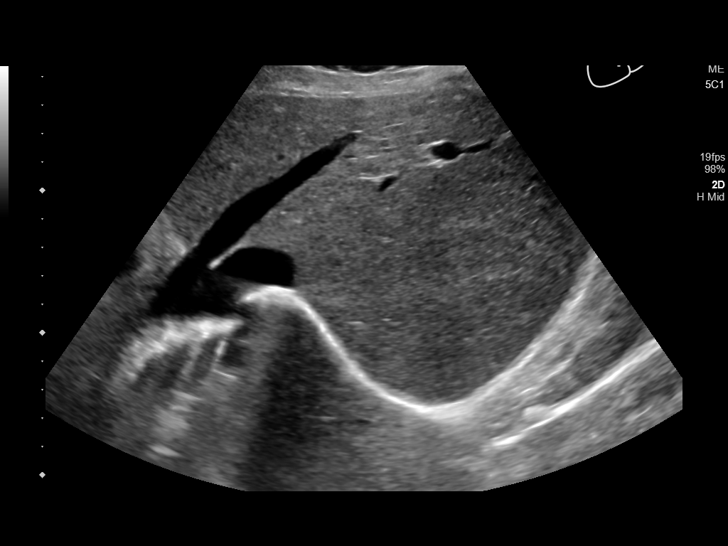
[im 40/105]
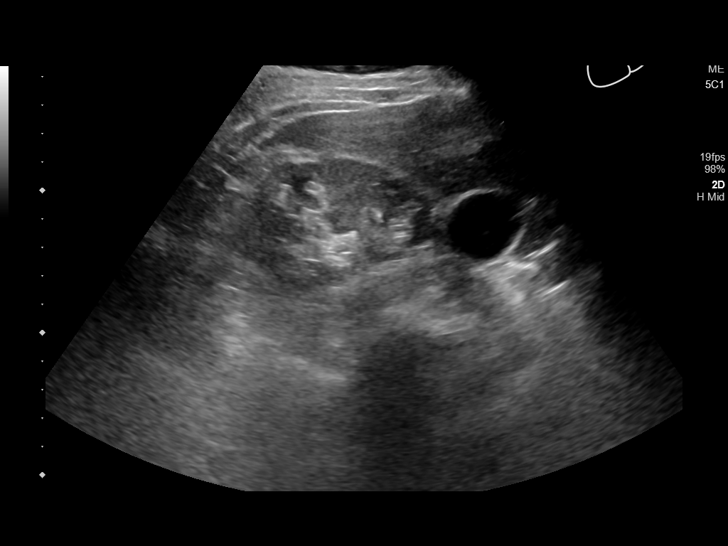
[im 48/105]
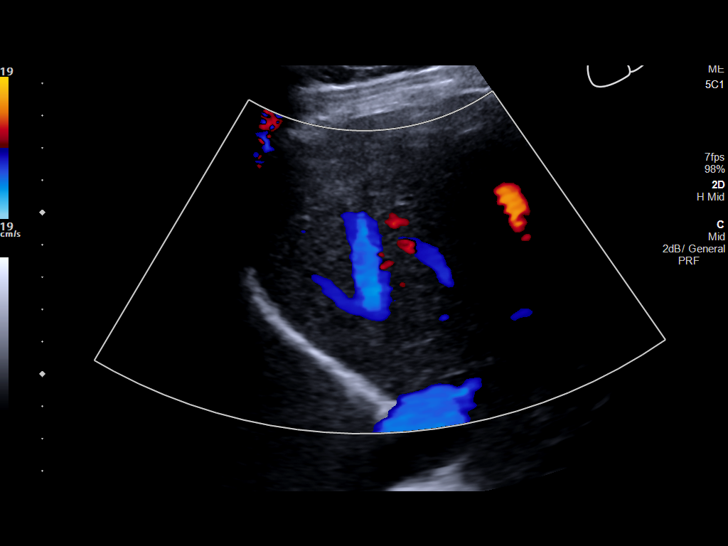
[im 57/105]
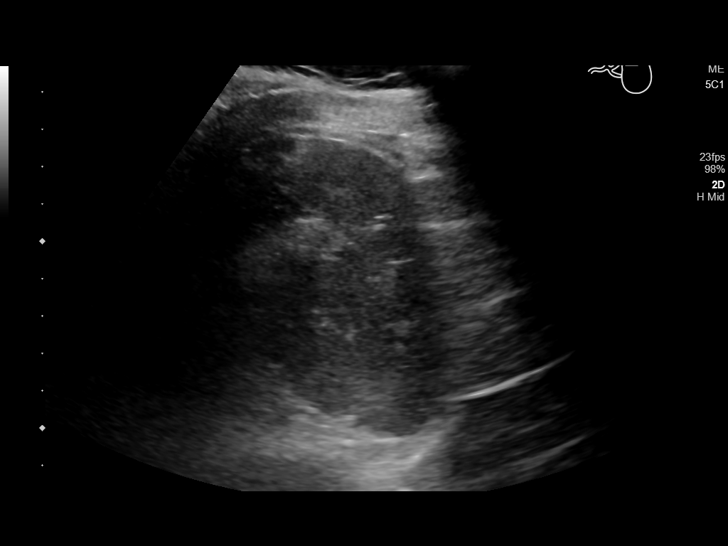
[im 66/105]
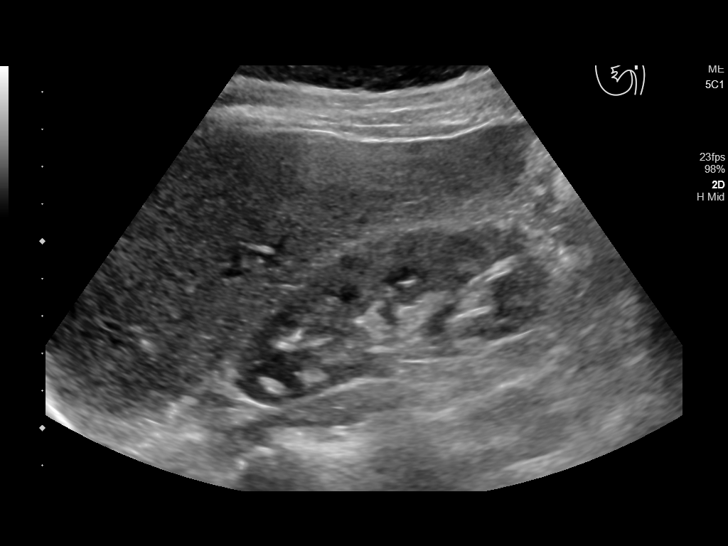
[im 70/105]
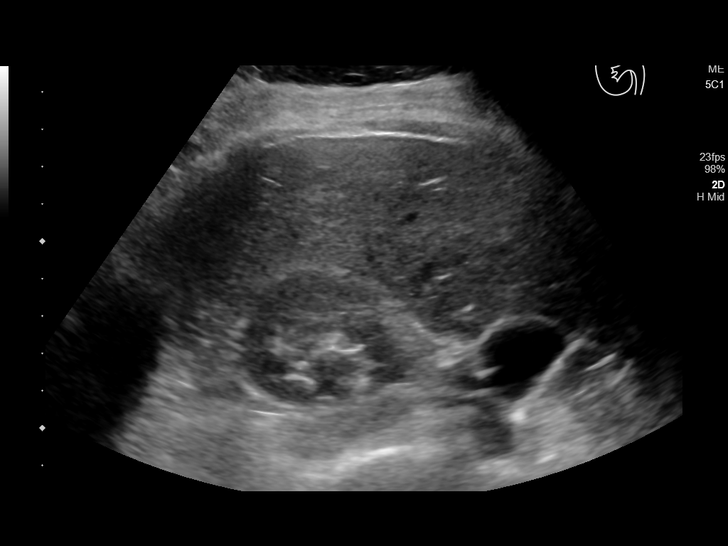
[im 79/105]
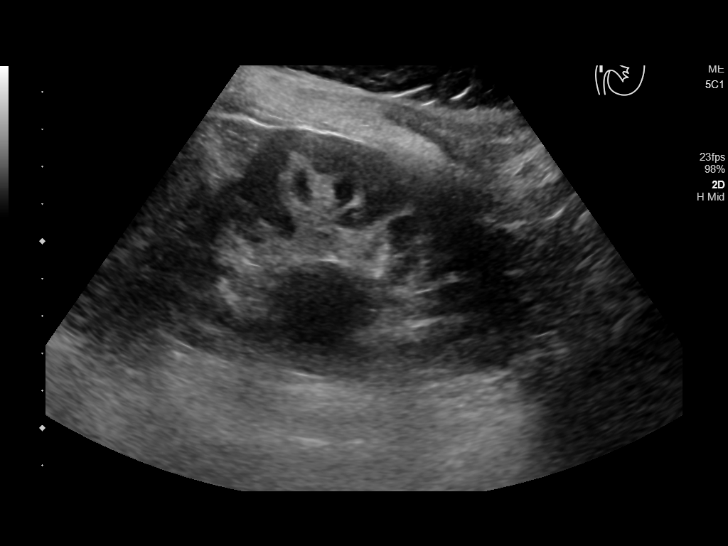
[im 87/105]
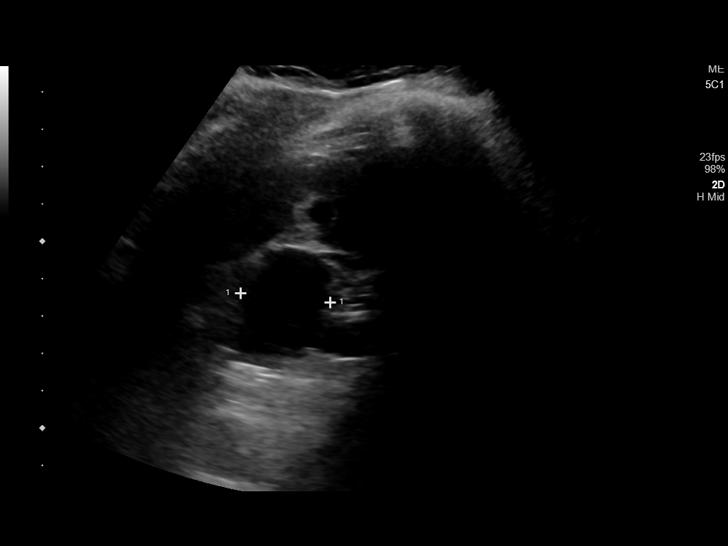
[im 96/105]
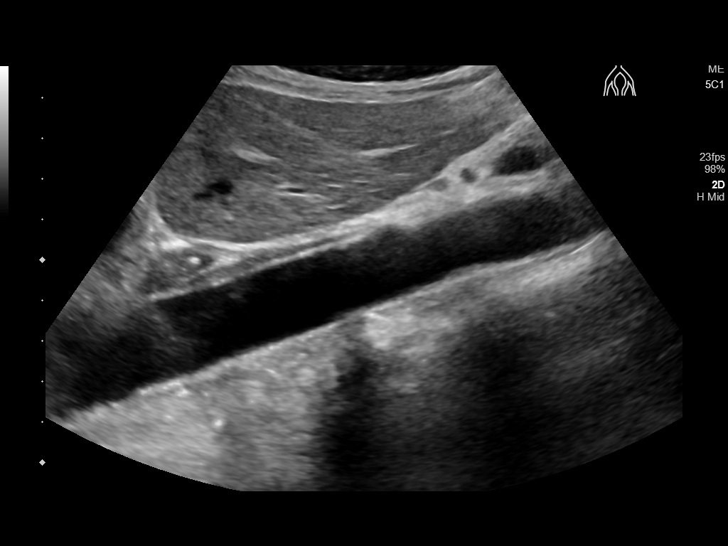
[im 105/105]
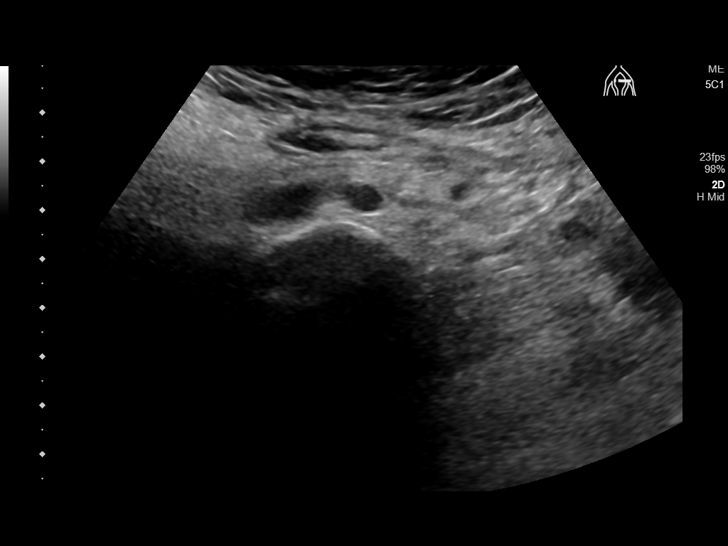

[14 of 25 positions shown; findings below may reference images not displayed]

FINDINGS: Gallbladder: Normally distended without stones or wall thickening.
No pericholecystic fluid or sonographic Murphy sign.

Common bile duct: Diameter: 2 mm, normal

Liver: Normal echogenicity without mass or intrahepatic biliary
dilatation portal vein is patent on color Doppler imaging with
normal direction of blood flow towards the liver.

IVC: Normal appearance

Pancreas: Normal appearance

Spleen: Normal appearance, 4.9 cm length

Right Kidney: Length: 9.5 cm. Normal morphology without mass or
hydronephrosis.

Left Kidney: Length: 10.1 cm. Small peripelvic cyst 3.0 x 2.8 x
cm again identified. No additional mass or hydronephrosis

Abdominal aorta: Normal caliber

Other findings: No free fluid
IMPRESSION: Small peripelvic LEFT renal cyst 3.0 cm greatest size, stable.

No acute upper abdominal sonographic abnormalities.

## 2020-12-29 ENCOUNTER — Telehealth: Payer: Self-pay

## 2020-12-29 DIAGNOSIS — N3941 Urge incontinence: Secondary | ICD-10-CM

## 2020-12-29 LAB — CULTURE, URINE COMPREHENSIVE

## 2020-12-29 NOTE — Telephone Encounter (Signed)
Spoke with patient and explained results from below, pt thinks the pain is from the cyst. Pt would like to proceed with the Botox.

## 2020-12-29 NOTE — Telephone Encounter (Signed)
This is a benign, stable-appearing cyst within her left kidney that sits next to the collecting system that drains urine from the kidney to her bladder. It is not causing a blockage or changing in size or complexity, so nothing needs to be done about this. I do not believe it is not the cause of her symptoms.

## 2020-12-29 NOTE — Telephone Encounter (Signed)
Notified patient as advised. Patient states she seen the results from CT on MyChart and wanted to inquire about what exactly was the parapelvic cyst in the upper pole of the left kidney is. Please advise.

## 2020-12-29 NOTE — Telephone Encounter (Signed)
-----   Message from [place], [place] sent at 12/29/2020 10:37 AM EST ----- Please contact the patient and inform her that her CT scan is normal.  She does not have any kidney stones or swelling in her kidneys.  Her urine culture is preliminary negative.  Based on this, I recommend that we proceed with repeat intravesical Botox therapy.  If she is in agreement with this plan, please initiate prior authorization with Adria Devon for this.  ----- Message ----- From: Interface, Rad Results In Sent: 12/25/2020   6:13 PM EST To: Debroah Loop, PA-C

## 2020-12-29 NOTE — Telephone Encounter (Signed)
Please obtain PA for Botox retreatment 6 weeks after her most recent injections

## 2020-12-30 ENCOUNTER — Encounter: Payer: BC Managed Care – PPO | Admitting: Podiatry

## 2021-01-08 NOTE — Telephone Encounter (Addendum)
PA iniatied-faxed PA request to [place] [phone]  Faxed records request to [place]

## 2021-01-19 NOTE — Addendum Note (Signed)
Addended by: Verlene Mayer A on: 01/19/2021 10:43 AM   Modules accepted: Orders

## 2021-01-19 NOTE — Telephone Encounter (Addendum)
Left patient a VM to return my call to setup Botox treatment and schedule UA/UCX.  PA approved for 01/08/21-03/08/21 #[id number]

## 2021-01-27 ENCOUNTER — Encounter: Payer: BC Managed Care – PPO | Admitting: Podiatry

## 2021-02-08 ENCOUNTER — Other Ambulatory Visit: Payer: Self-pay

## 2021-02-08 ENCOUNTER — Other Ambulatory Visit: Payer: BC Managed Care – PPO

## 2021-02-08 DIAGNOSIS — N3941 Urge incontinence: Secondary | ICD-10-CM

## 2021-02-08 LAB — URINALYSIS, COMPLETE
Bilirubin, UA: NEGATIVE
Glucose, UA: NEGATIVE
Ketones, UA: NEGATIVE
Leukocytes,UA: NEGATIVE
Nitrite, UA: NEGATIVE
Protein,UA: NEGATIVE
RBC, UA: NEGATIVE
Specific Gravity, UA: 1.025 (ref 1.005–1.030)
Urobilinogen, Ur: 0.2 mg/dL (ref 0.2–1.0)
pH, UA: 7 (ref 5.0–7.5)

## 2021-02-08 LAB — MICROSCOPIC EXAMINATION

## 2021-02-11 ENCOUNTER — Other Ambulatory Visit: Payer: Self-pay

## 2021-02-11 LAB — CULTURE, URINE COMPREHENSIVE

## 2021-02-16 ENCOUNTER — Telehealth: Payer: Self-pay | Admitting: *Deleted

## 2021-02-16 NOTE — Telephone Encounter (Signed)
Patient left a voice mail about her appt on Thursday . She wanted to know if yall call her in any medication before her botox procedure. Patient states she left a message on Friday

## 2021-02-17 ENCOUNTER — Other Ambulatory Visit: Payer: Self-pay

## 2021-02-17 ENCOUNTER — Telehealth: Payer: Self-pay | Admitting: Urology

## 2021-02-17 DIAGNOSIS — R350 Frequency of micturition: Secondary | ICD-10-CM

## 2021-02-17 MED ORDER — DIAZEPAM 10 MG PO TABS: 10.0000 mg | ORAL_TABLET | Freq: Once | ORAL | 0 refills | Status: AC

## 2021-02-17 NOTE — Telephone Encounter (Signed)
Valium prescribed.  Must have driver if she she is to take this medication 1 hour before the procedure.  Hollice Espy, MD

## 2021-02-17 NOTE — Telephone Encounter (Signed)
Pt states that she left message last week and yesterday about needing a Rx for something to relax her for tomorrow's procedure. Please advise.

## 2021-02-17 NOTE — Telephone Encounter (Signed)
Patient informed, voiced understanding.  °

## 2021-02-18 ENCOUNTER — Other Ambulatory Visit: Payer: Self-pay

## 2021-02-18 ENCOUNTER — Ambulatory Visit (INDEPENDENT_AMBULATORY_CARE_PROVIDER_SITE_OTHER): Payer: BC Managed Care – PPO | Admitting: Urology

## 2021-02-18 VITALS — BP 124/76 | HR 74 | Wt 139.0 lb

## 2021-02-18 DIAGNOSIS — N3281 Overactive bladder: Secondary | ICD-10-CM

## 2021-02-18 DIAGNOSIS — R350 Frequency of micturition: Secondary | ICD-10-CM

## 2021-02-18 LAB — URINALYSIS, COMPLETE
Bilirubin, UA: NEGATIVE
Glucose, UA: NEGATIVE
Ketones, UA: NEGATIVE
Leukocytes,UA: NEGATIVE
Nitrite, UA: NEGATIVE
Protein,UA: NEGATIVE
Specific Gravity, UA: 1.03 (ref 1.005–1.030)
Urobilinogen, Ur: 0.2 mg/dL (ref 0.2–1.0)
pH, UA: 6 (ref 5.0–7.5)

## 2021-02-18 LAB — MICROSCOPIC EXAMINATION: Bacteria, UA: NONE SEEN

## 2021-02-18 MED ORDER — CIPROFLOXACIN HCL 500 MG PO TABS
500.0000 mg | ORAL_TABLET | Freq: Once | ORAL | Status: AC
  Administered 2021-02-18: 500 mg via ORAL

## 2021-02-18 MED ORDER — LIDOCAINE HCL 2 % IJ SOLN
60.0000 mL | Freq: Once | INTRAMUSCULAR | Status: AC
  Administered 2021-02-18: 1200 mg

## 2021-02-18 MED ORDER — ONABOTULINUMTOXINA 100 UNITS IJ SOLR
100.0000 [IU] | Freq: Once | INTRAMUSCULAR | Status: AC
  Administered 2021-02-18: 100 [IU] via INTRAMUSCULAR

## 2021-02-18 NOTE — Addendum Note (Signed)
Addended by: Alvera Novel on: 02/18/2021 09:45 AM   Modules accepted: Orders

## 2021-02-18 NOTE — Progress Notes (Signed)
   02/18/21  CC:  Chief Complaint  Patient presents with  . Botulinum Toxin Injection    HPI: 55 year old female with a personal history of severe refractory OAB managed with Botox who presents today for Botox injection.  Please see previous notes for details.  Preprocedural urine culture was negative.  She did receive p.o. antibiotics today for the procedure.  Lidocaine jelly was instilled into the bladder prior to the procedure allowed to dwell for 30 minutes.  Please see CMA note for details.  Blood pressure 102/64, pulse 71, height 5\' 6"  (1.676 m), weight 130 lb (59 kg), last menstrual period 02/07/1994. NED. A&Ox3.   No respiratory distress   Abd soft, NT, ND Normal external genitalia with patent urethral meatus  Cystoscopy Procedure Note  Patient identification was confirmed, informed consent was obtained, and patient was prepped using Betadine solution.  Lidocaine jelly was administered per urethral meatus.    Procedure: - Flexible cystoscope introduced, without any difficulty.   - Thorough search of the bladder revealed:    normal urethral meatus    normal urothelium    no stones    no ulcers     no tumors    no urethral polyps    no trabeculation  - Ureteral orifices were normal in position and appearance.  At this point in time, flexible beveled injectable needle was used through the working port at which time 2 rows of 10 injections were given just superior to the trigone along the posterior bladder wall delivering a total of 100 units of Botox.    Post-Procedure: - Patient tolerated the procedure well  Assessment/ Plan:  1. OAB (overactive bladder) Status post well-tolerated in office Botox injection to the bladder, 100 units  Post procedure warning symptoms reviewed  Given that she is tolerated this medication well in the past, will just have her follow-up in 6 months.  She will return back sooner as needed.  We discussed that if this administration  fails to work, we may have her see my partner, Dr. Matilde Sprang to discuss InterStim.  - lidocaine (XYLOCAINE) 2 % (with pres) injection 1,200 mg - botulinum toxin Type A (BOTOX) injection 100 Units - ciprofloxacin (CIPRO) tablet 500 mg   Hollice Espy, MD

## 2021-02-18 NOTE — Progress Notes (Signed)
Bladder Instillation  Due to Botox patient is present today for a Bladder Instillation of 2% lidocaine. Patient was cleaned and prepped in a sterile fashion with betadine and lidocaine 2% jelly was instilled into the urethra.  A 14FR catheter was inserted, urine return was noted 22ml, urine was yellow in color.  60 ml was instilled into the bladder. The catheter was then removed. Patient tolerated well, no complications were noted Patient held in bladder for 30 minutes prior to procedure starting.   Performed by: Verlene Mayer, [place]

## 2021-03-09 ENCOUNTER — Ambulatory Visit (INDEPENDENT_AMBULATORY_CARE_PROVIDER_SITE_OTHER): Payer: BC Managed Care – PPO | Admitting: Internal Medicine

## 2021-03-09 ENCOUNTER — Other Ambulatory Visit: Payer: Self-pay

## 2021-03-09 VITALS — BP 112/70 | HR 66 | Temp 98.1°F | Resp 16 | Ht 67.0 in | Wt 141.0 lb

## 2021-03-09 DIAGNOSIS — K219 Gastro-esophageal reflux disease without esophagitis: Secondary | ICD-10-CM | POA: Diagnosis not present

## 2021-03-09 DIAGNOSIS — R0989 Other specified symptoms and signs involving the circulatory and respiratory systems: Secondary | ICD-10-CM | POA: Diagnosis not present

## 2021-03-09 DIAGNOSIS — R350 Frequency of micturition: Secondary | ICD-10-CM

## 2021-03-09 DIAGNOSIS — F439 Reaction to severe stress, unspecified: Secondary | ICD-10-CM

## 2021-03-09 DIAGNOSIS — D649 Anemia, unspecified: Secondary | ICD-10-CM

## 2021-03-09 DIAGNOSIS — D0512 Intraductal carcinoma in situ of left breast: Secondary | ICD-10-CM

## 2021-03-09 DIAGNOSIS — Z13220 Encounter for screening for lipoid disorders: Secondary | ICD-10-CM

## 2021-03-09 DIAGNOSIS — M7660 Achilles tendinitis, unspecified leg: Secondary | ICD-10-CM

## 2021-03-09 NOTE — Progress Notes (Signed)
Patient ID: Rose Bernard, female   DOB: [birth year], 55 y.o.   MRN: [id number]   Subjective:    Patient ID: Rose Bernard, female    DOB: 04-14-1966, 55 y.o.   MRN: [id number]  HPI This visit occurred during the SARS-CoV-2 public health emergency.  Safety protocols were in place, including screening questions prior to the visit, additional usage of staff PPE, and extensive cleaning of exam room while observing appropriate contact time as indicated for disinfecting solutions.  Patient here for a scheduled follow up.  Recently diagnosed with breast cancer.  Was placed on arimidex.  Had intolerance.  Hematology instructed to hold for two weeks.  She plans to call and f/u with them.  She is working.  Husband just started a new job.  Overall handling stress relatively well.  Still seeing urology for her bladder issues.  She does report increased pain - achilles - left.  Increased recently.  Foot and achilles, may be aggravating her leg as well.  No leg swelling or redness.  Eating.  No nausea or vomiting reported.  Discussed metamucil - bowels.  No chest pain or sob reported.  No increased cough or congestion.   Past Medical History:  Diagnosis Date  . Chronic cystitis   . Frequent UTI   . GERD (gastroesophageal reflux disease)   . History of kidney stones   . Incomplete bladder emptying   . 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 ABDOMINAL HYSTERECTOMY  1995   2/2 delivery complications with 3rd child. Hysterectomy and bladder repair with subsequent appendectomy. L Ovary remain in place   . APPENDECTOMY     2001 due to scar tissue  . BLADDER BOTOX  2014, 2015, 2016  . BREAST BIOPSY Bilateral 1991  . BREAST BIOPSY Left 12/12/2019   [place] bx, path pending  . BREAST SURGERY Bilateral 2002   benign mass removed  . COLONOSCOPY  2014  . EXCISION OF BREAST BIOPSY Left 01/10/2020   Procedure: EXCISION OF BREAST BIOPSY w/ Needle  localization;  Surgeon: Herbert Pun, MD;  Location: [facility] ORS;  Service: General;  Laterality: Left;  . FOOT SURGERY Bilateral   . LAPAROSCOPIC HYSTERECTOMY  1995  . MASTECTOMY, PARTIAL Left 01/31/2020   Procedure: MASTECTOMY PARTIAL;  Surgeon: Herbert Pun, MD;  Location: [facility] ORS;  Service: General;  Laterality: Left;  . reconstruction of bladder   1995    2/2 delivery complications with 3rd child    Family History  Problem Relation Age of Onset  . Colon cancer Father        With ostomy in place   . Breast cancer Paternal Aunt 35  . Breast cancer Maternal Aunt   . Breast cancer Maternal Aunt    Social History   Socioeconomic History  . Marital status: Married    Spouse name: Not on file  . Number of children: 2  . Years of education: Not on file  . Highest education level: Not on file  Occupational History  . Not on file  Tobacco Use  . Smoking status: Never Smoker  . Smokeless tobacco: Never Used  Vaping Use  . Vaping Use: Never used  Substance and Sexual Activity  . Alcohol use: No    Alcohol/week: 0.0 standard drinks  . Drug use: No  . Sexual activity: Yes    Birth control/protection: Surgical  Other Topics Concern  . Not on file  Social History Narrative  .  Not on file   Social Determinants of Health   Financial Resource Strain: Not on file  Food Insecurity: Not on file  Transportation Needs: Not on file  Physical Activity: Not on file  Stress: Not on file  Social Connections: Not on file    Outpatient Encounter Medications as of 03/09/2021  Medication Sig  . anastrozole (ARIMIDEX) 1 MG tablet Take 1 tablet (1 mg total) by mouth daily.  Mar[place] Kitchen esomeprazole (NEXIUM) 20 MG capsule Take 20 mg by mouth daily as needed (acid reflux).   . meloxicam (MOBIC) 15 MG tablet Take 1 tablet (15 mg total) by mouth daily.  . mupirocin ointment (BACTROBAN) 2 % Apply 1 application topically 2 (two) times daily.  . [DISCONTINUED] Cholecalciferol 25 MCG (1000  UT) capsule Take by mouth.  . [DISCONTINUED] LORazepam (ATIVAN) 1 MG tablet Take dose 30-60 minutes prior to scheduled procedure.   No facility-administered encounter medications on file as of 03/09/2021.    Review of Systems  Constitutional: Negative for appetite change and unexpected weight change.  HENT: Negative for congestion and sinus pressure.   Respiratory: Negative for cough, chest tightness and shortness of breath.   Cardiovascular: Negative for chest pain, palpitations and leg swelling.  Gastrointestinal: Negative for abdominal pain, diarrhea, nausea and vomiting.  Genitourinary: Positive for frequency. Negative for difficulty urinating.  Musculoskeletal: Negative for myalgias.       Increased pain - left achilles.   Skin: Negative for color change and rash.  Neurological: Negative for dizziness, syncope and light-headedness.  Psychiatric/Behavioral: Negative for agitation and dysphoric mood.       Objective:    Physical Exam Vitals reviewed.  Constitutional:      General: She is not in acute distress.    Appearance: Normal appearance.  HENT:     Head: Normocephalic and atraumatic.     Right Ear: External ear normal.     Left Ear: External ear normal.  Eyes:     General: No scleral icterus.       Right eye: No discharge.        Left eye: No discharge.     Conjunctiva/sclera: Conjunctivae normal.  Neck:     Thyroid: No thyromegaly.     Comments: Left carotid bruit.  Cardiovascular:     Rate and Rhythm: Normal rate and regular rhythm.  Pulmonary:     Effort: No respiratory distress.     Breath sounds: Normal breath sounds. No wheezing.  Abdominal:     General: Bowel sounds are normal.     Palpations: Abdomen is soft.     Tenderness: There is no abdominal tenderness.  Musculoskeletal:        General: No swelling.     Cervical back: Neck supple. No tenderness.     Comments: Increased pain palpation - left achilles.   Lymphadenopathy:     Cervical: No  cervical adenopathy.  Skin:    Findings: No erythema or rash.  Neurological:     Mental Status: She is alert.  Psychiatric:        Mood and Affect: Mood normal.        Behavior: Behavior normal.     BP 112/70   Pulse 66   Temp 98.1 F (36.7 C) (Oral)   Resp 16   Ht 5\' 7"  (1.702 m)   Wt 141 lb (64 kg)   LMP 02/07/1994   SpO2 99%   BMI 22.08 kg/m  Wt Readings from Last 3 Encounters:  03/09/21 141  lb (64 kg)  02/18/21 139 lb (63 kg)  12/24/20 139 lb (63 kg)     Lab Results  Component Value Date   WBC 4.8 11/09/2020   HGB 13.3 11/09/2020   HCT 39.8 11/09/2020   PLT 259.0 11/09/2020   GLUCOSE 81 11/09/2020   CHOL 197 11/09/2020   TRIG 129.0 11/09/2020   HDL 71.20 11/09/2020   LDLCALC 100 (H) 11/09/2020   ALT 12 11/09/2020   AST 14 11/09/2020   NA 141 11/09/2020   K 4.0 11/09/2020   CL 104 11/09/2020   CREATININE 0.71 11/09/2020   BUN 15 11/09/2020   CO2 32 11/09/2020   TSH 1.87 11/09/2020   HGBA1C 5.6 10/01/2015    CT RENAL STONE STUDY  Result Date: 12/25/2020 CLINICAL DATA:  Bilateral flank pain for 1 week. EXAM: CT ABDOMEN AND PELVIS WITHOUT CONTRAST TECHNIQUE: Multidetector CT imaging of the abdomen and pelvis was performed following the standard protocol without IV contrast. COMPARISON:  Renal ultrasound 12/24/2020. CT abdomen and pelvis 09/29/2015. FINDINGS: Lower chest: Lung bases are clear. No pleural or pericardial effusion. Hepatobiliary: No focal liver abnormality is seen. No gallstones, gallbladder wall thickening, or biliary dilatation. Pancreas: Unremarkable. No pancreatic ductal dilatation or surrounding inflammatory changes. Spleen: Normal in size without focal abnormality. Adrenals/Urinary Tract: The adrenal glands are normal in appearance bilaterally. No renal stones or hydronephrosis. Parapelvic cyst in the upper pole of the left kidney is unchanged since the prior CT. Urinary bladder appears normal. Ureters are negative. Stomach/Bowel: Stomach is  within normal limits. Status post appendectomy. No evidence of bowel wall thickening, distention, or inflammatory changes. Vascular/Lymphatic: No significant vascular findings are present. No enlarged abdominal or pelvic lymph nodes. Reproductive: Status post hysterectomy. No adnexal masses. Other: None. Musculoskeletal: No acute or focal abnormality. IMPRESSION: Negative for urinary tract stone. No acute abnormality abdomen or pelvis. Unchanged parapelvic cyst left kidney. Electronically Signed   By: Inge Rise M.D.   On: 12/25/2020 17:06       Assessment & Plan:   Problem List Items Addressed This Visit    Anemia    Follow cbc.       DCIS (ductal carcinoma in situ)    S/p excision.  Being followed by oncology.  Completed XRT 03/2018.  Was on arimidex.  Off for now.  Plans to f/u with oncology.        Relevant Orders   Comprehensive metabolic panel   GERD (gastroesophageal reflux disease)    No upper symptoms reported.  On nexium.       Left carotid bruit    Carotid bruit audible on exam.  Check carotid ultrasound.       Relevant Orders   VAS US CAROTID   Pain in Achilles tendon    Increased pain - achilles.  Discussed referral to podiatry.       Stress    Discussed with her today.  Overall she appears to be handling things well.  Follow.        Urinary frequency    Followed by urology.  Has received botox.         Other Visit Diagnoses    Screening cholesterol level    -  Primary   Relevant Orders   Lipid panel       Einar Pheasant, MD

## 2021-03-10 ENCOUNTER — Encounter: Payer: Self-pay | Admitting: Internal Medicine

## 2021-03-11 ENCOUNTER — Encounter: Payer: Self-pay | Admitting: Internal Medicine

## 2021-03-11 DIAGNOSIS — M7660 Achilles tendinitis, unspecified leg: Secondary | ICD-10-CM | POA: Insufficient documentation

## 2021-03-11 NOTE — Assessment & Plan Note (Signed)
No upper symptoms reported.  On nexium.   

## 2021-03-11 NOTE — Assessment & Plan Note (Signed)
Carotid bruit audible on exam.  Check carotid ultrasound.

## 2021-03-11 NOTE — Assessment & Plan Note (Signed)
Followed by urology.  Has received botox.

## 2021-03-11 NOTE — Assessment & Plan Note (Signed)
Discussed with her today.  Overall she appears to be handling things well.  Follow.

## 2021-03-11 NOTE — Assessment & Plan Note (Signed)
Follow cbc.  

## 2021-03-11 NOTE — Telephone Encounter (Signed)
See her note.  She is already seeing podiatry - Dr Posey Pronto ([place]).  She should not need a new referral.  She is having pain in achilles tendon.  Desires a f/u appt with Dr Posey Pronto.  Can you call and see if we can get an appt - pain - achilles tendon.   Thanks.

## 2021-03-11 NOTE — Assessment & Plan Note (Signed)
Increased pain - achilles.  Discussed referral to podiatry.

## 2021-03-11 NOTE — Assessment & Plan Note (Signed)
S/p excision.  Being followed by oncology.  Completed XRT 03/2018.  Was on arimidex.  Off for now.  Plans to f/u with oncology.

## 2021-03-16 ENCOUNTER — Ambulatory Visit: Payer: BC Managed Care – PPO | Admitting: Podiatry

## 2021-03-18 ENCOUNTER — Ambulatory Visit (INDEPENDENT_AMBULATORY_CARE_PROVIDER_SITE_OTHER): Payer: BC Managed Care – PPO

## 2021-03-18 ENCOUNTER — Other Ambulatory Visit: Payer: Self-pay

## 2021-03-18 DIAGNOSIS — R0989 Other specified symptoms and signs involving the circulatory and respiratory systems: Secondary | ICD-10-CM | POA: Diagnosis not present

## 2021-03-23 ENCOUNTER — Ambulatory Visit: Payer: BC Managed Care – PPO | Admitting: Podiatry

## 2021-03-29 ENCOUNTER — Ambulatory Visit (INDEPENDENT_AMBULATORY_CARE_PROVIDER_SITE_OTHER): Payer: BC Managed Care – PPO | Admitting: Podiatry

## 2021-03-29 ENCOUNTER — Encounter: Payer: Self-pay | Admitting: Podiatry

## 2021-03-29 ENCOUNTER — Other Ambulatory Visit (INDEPENDENT_AMBULATORY_CARE_PROVIDER_SITE_OTHER): Payer: BC Managed Care – PPO

## 2021-03-29 ENCOUNTER — Other Ambulatory Visit: Payer: Self-pay | Admitting: Podiatry

## 2021-03-29 ENCOUNTER — Ambulatory Visit (INDEPENDENT_AMBULATORY_CARE_PROVIDER_SITE_OTHER): Payer: BC Managed Care – PPO

## 2021-03-29 ENCOUNTER — Other Ambulatory Visit: Payer: Self-pay

## 2021-03-29 DIAGNOSIS — M7751 Other enthesopathy of right foot: Secondary | ICD-10-CM

## 2021-03-29 DIAGNOSIS — M778 Other enthesopathies, not elsewhere classified: Secondary | ICD-10-CM

## 2021-03-29 DIAGNOSIS — Z13220 Encounter for screening for lipoid disorders: Secondary | ICD-10-CM | POA: Diagnosis not present

## 2021-03-29 DIAGNOSIS — D0512 Intraductal carcinoma in situ of left breast: Secondary | ICD-10-CM

## 2021-03-29 LAB — COMPREHENSIVE METABOLIC PANEL
ALT: 12 U/L (ref 0–35)
AST: 14 U/L (ref 0–37)
Albumin: 4.1 g/dL (ref 3.5–5.2)
Alkaline Phosphatase: 89 U/L (ref 39–117)
BUN: 14 mg/dL (ref 6–23)
CO2: 31 mEq/L (ref 19–32)
Calcium: 9.2 mg/dL (ref 8.4–10.5)
Chloride: 104 mEq/L (ref 96–112)
Creatinine, Ser: 0.68 mg/dL (ref 0.40–1.20)
GFR: 98.2 mL/min (ref >60.00)
Glucose, Bld: 80 mg/dL (ref 70–99)
Potassium: 4.2 mEq/L (ref 3.5–5.1)
Sodium: 141 mEq/L (ref 135–145)
Total Bilirubin: 0.6 mg/dL (ref 0.2–1.2)
Total Protein: 6.9 g/dL (ref 6.0–8.3)

## 2021-03-29 LAB — LIPID PANEL
Cholesterol: 200 mg/dL (ref 0–200)
HDL: 71.3 mg/dL (ref >39.00)
LDL Cholesterol: 112 mg/dL — ABNORMAL HIGH (ref 0–99)
NonHDL: 129.01
Total CHOL/HDL Ratio: 3
Triglycerides: 85 mg/dL (ref 0.0–149.0)
VLDL: 17 mg/dL (ref 0.0–40.0)

## 2021-03-29 IMAGING — MG MM BREAST SURGICAL SPECIMEN
1 series · 1 of 1 positions shown · non-contrast
Comparison: Previous exam(s).

CLINICAL DATA: Specimen radiograph status post left breast
lumpectomy.

EXAM:
SPECIMEN RADIOGRAPH OF THE LEFT BREAST

[L]
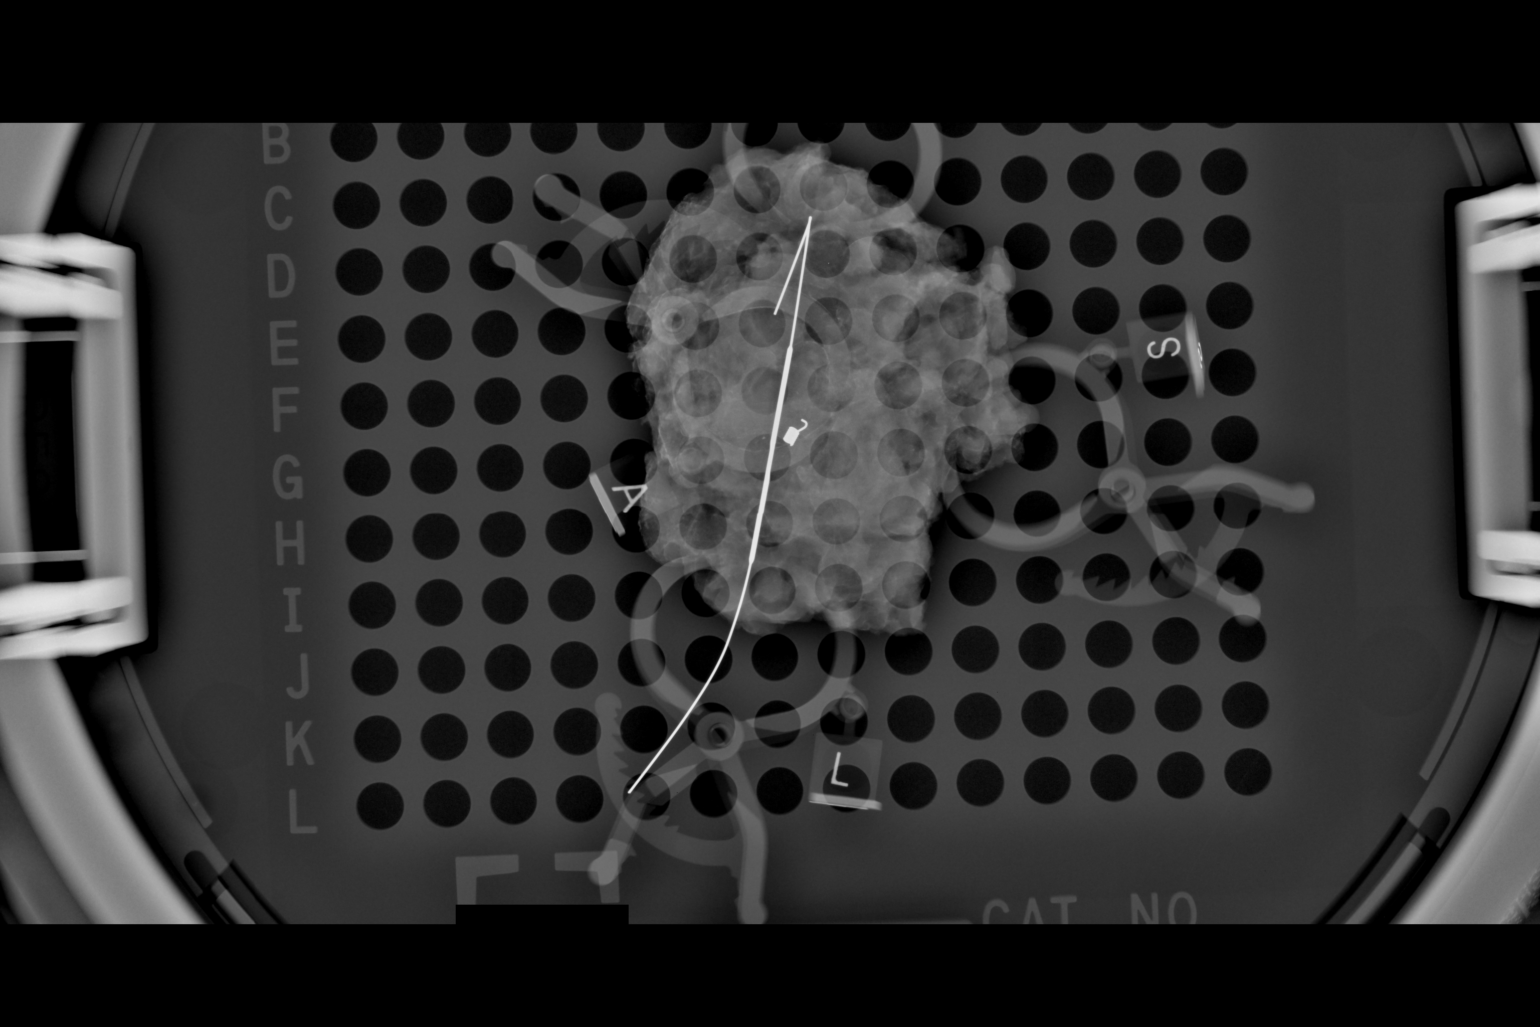

[1 of 1 positions shown; findings below may reference images not displayed]

FINDINGS: Status post excision of the left breast. The wire tip and biopsy
marker clip are present and are marked for pathology. These findings
were communicated to Dr. Ayres in the OR at [DATE] p.m.
IMPRESSION: Specimen radiograph of the left breast.

## 2021-03-29 IMAGING — MG MM DIGITAL DIAGNOSTIC UNILAT*L* W/ TOMO W/ CAD
3 series · 3 of 3 positions shown · non-contrast
Comparison: Previous exam(s).

CLINICAL DATA: Mammogram status post wire localization of the left
breast.

EXAM:
3D DIAGNOSTIC LEFT MAMMOGRAM POST ULTRASOUND GUIDED WIRE
LOCALIZATION

[L LM]
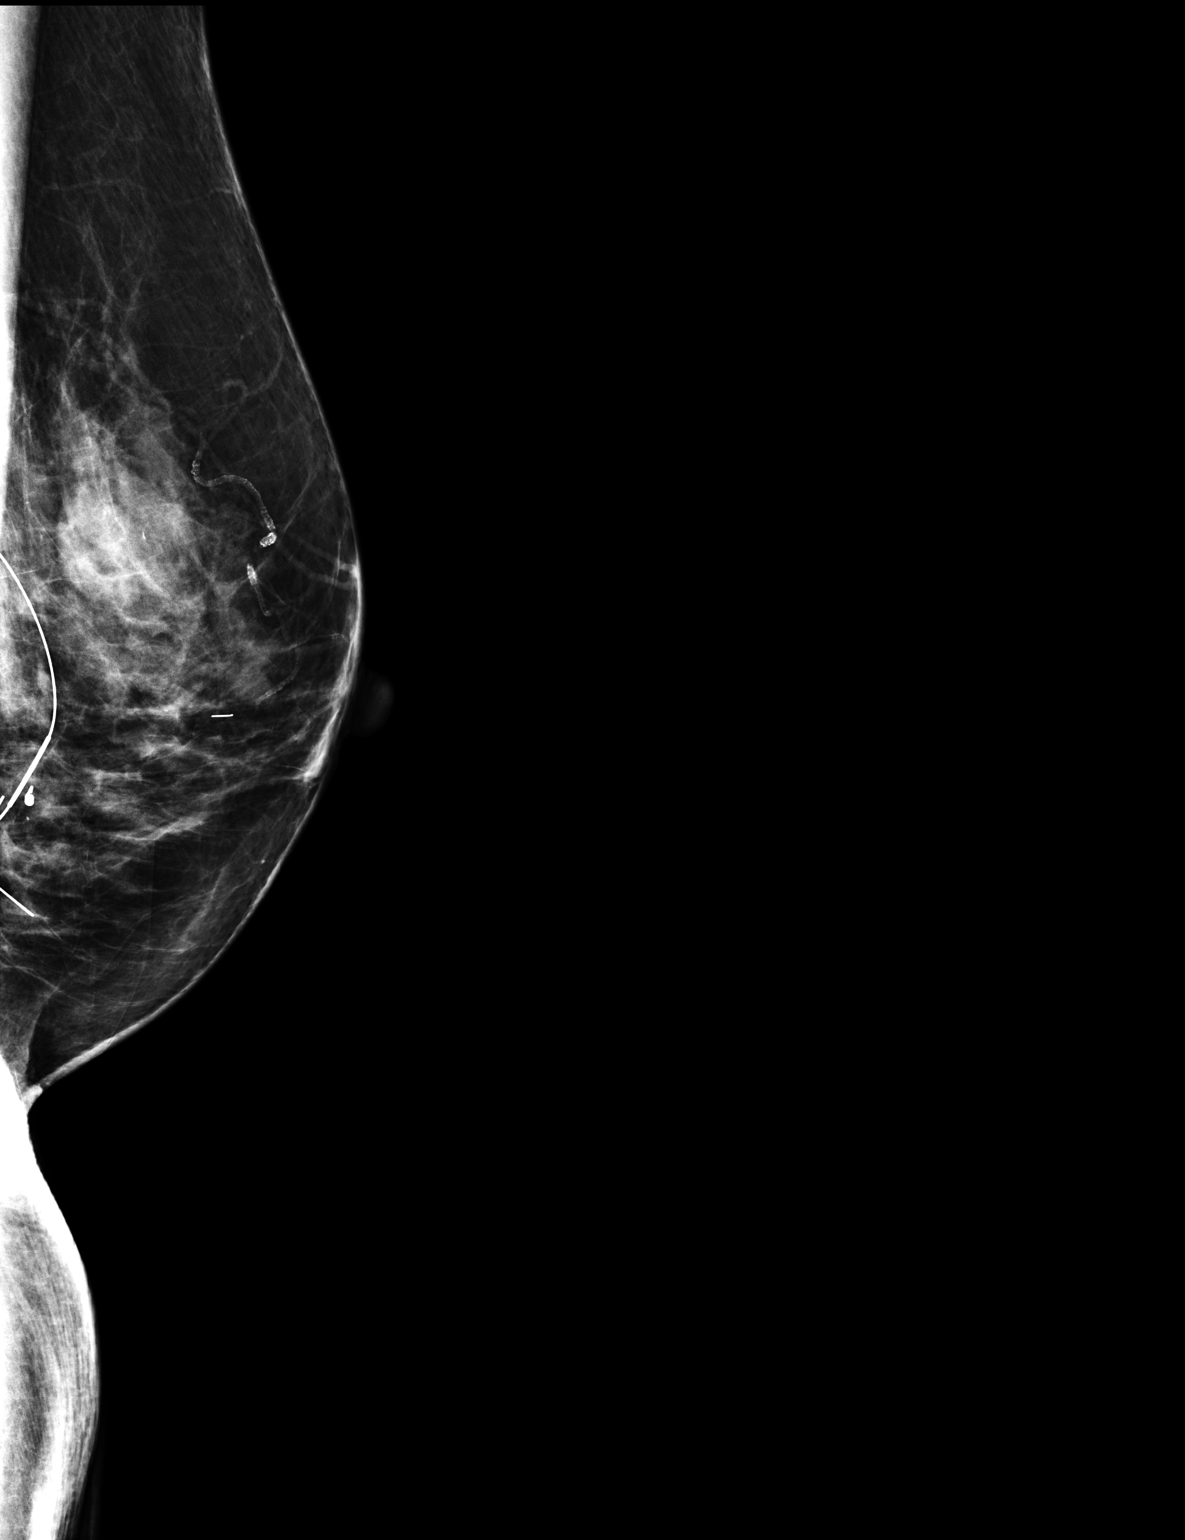

[L CC (1 of 2)]
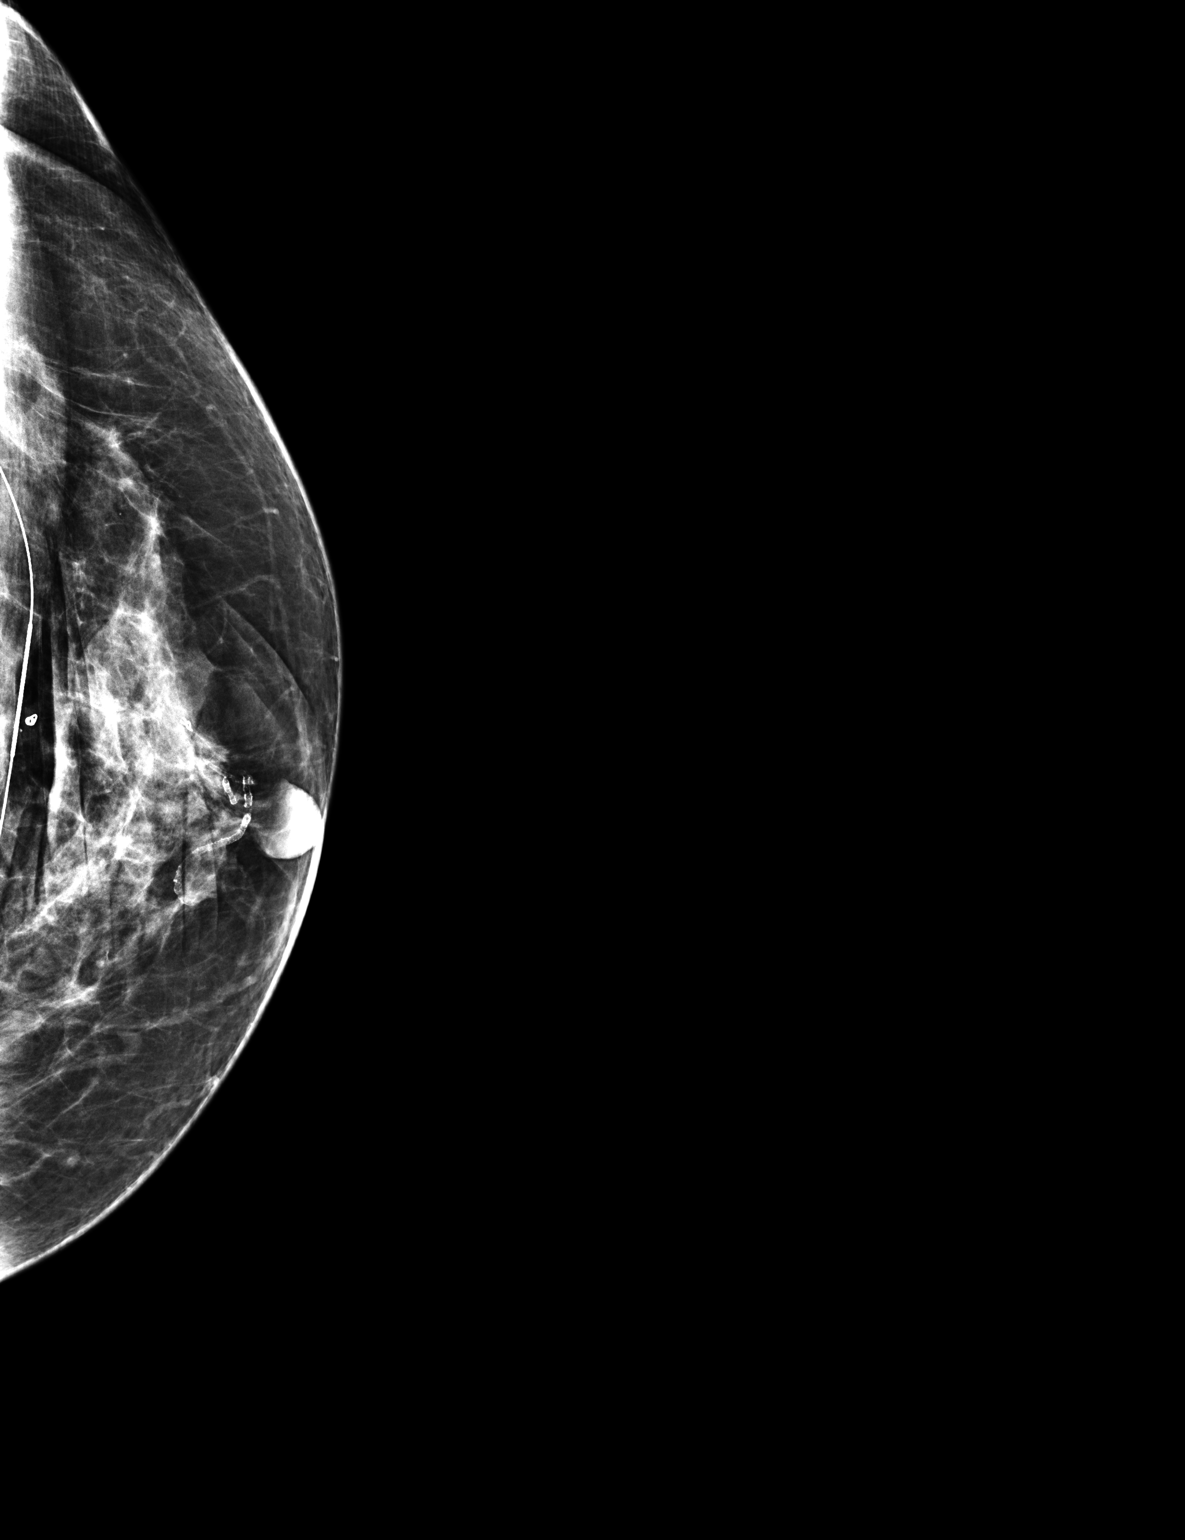

[L CC (2 of 2)]
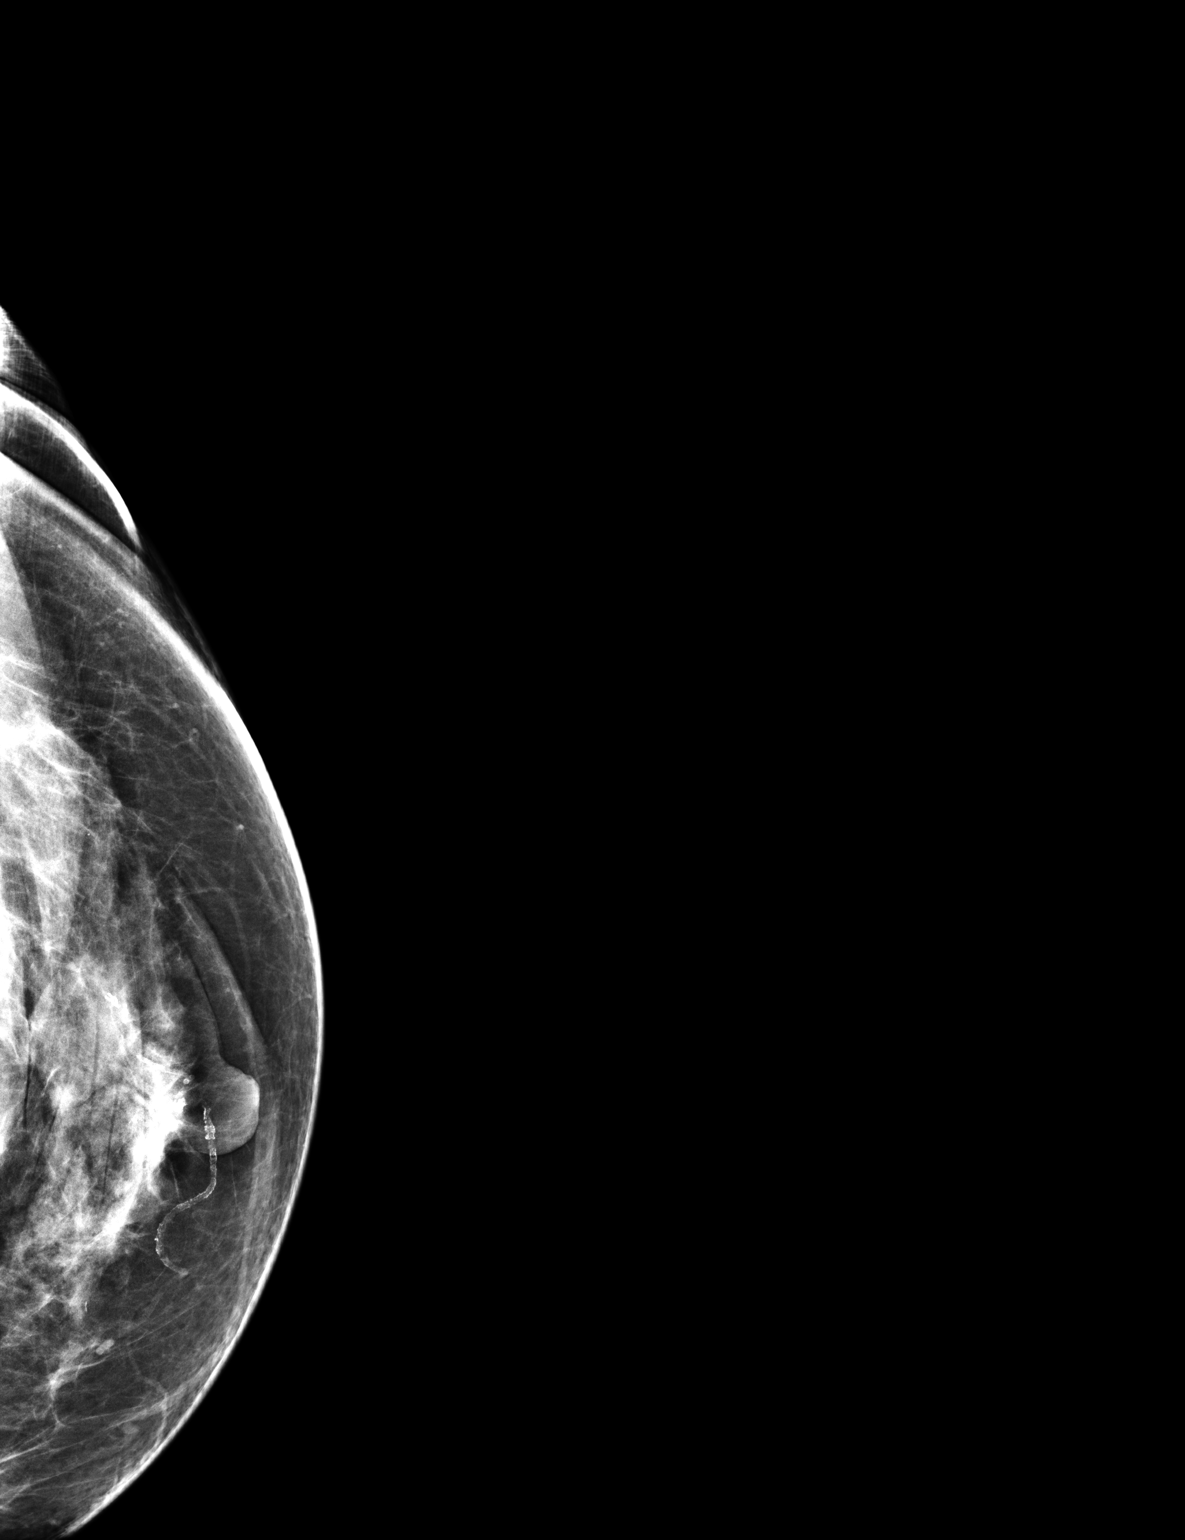

[3 of 3 positions shown; findings below may reference images not displayed]

FINDINGS: 3D Mammographic images were obtained following ultrasound guided
wire localization of a mass at the coil shaped biopsy marking clip
in the lower outer left breast. The wire is appropriately positioned
with the thick portion of the wire adjacent to the coil shaped
biopsy marker.
IMPRESSION: Appropriate positioning of the localization wire at the site of the
coil shaped biopsy marking clip in the lower outer left breast.

Final Assessment: Post Procedure Mammograms for Marker Placement

## 2021-03-29 MED ORDER — METHYLPREDNISOLONE 4 MG PO TBPK: ORAL_TABLET | ORAL | 0 refills | Status: DC

## 2021-04-01 NOTE — Progress Notes (Signed)
  Subjective:  Patient ID: Rose Bernard, female    DOB: 27-Jun-1966,  MRN: [id number]  Chief Complaint  Patient presents with  . Foot Pain    Patient presents back today  for right top midfoot pain x 1 week.  She says it burns and is sharp and stabbing most of the time    55 y.o. female presents with the above complaint. History confirmed with patient.  This is a new issue for her  Objective:  Physical Exam: warm, good capillary refill, no trophic changes or ulcerative lesions, normal DP and PT pulses and normal sensory exam.  Right Foot: Sharp pain on palpation to the fourth intermetatarsal space, no neuritic symptoms or plantar plate pathology  Radiographs: X-ray of the right foot: no fracture, dislocation, swelling or degenerative changes noted no evidence of stress fracture  Assessment:   1. Bursitis of intermetatarsal bursa of right foot      Plan:  Patient was evaluated and treated and all questions answered.  Suspect she has an intermetatarsal bursitis.  Discussed treatment options for this including injection therapy.  Recommended this today.  After sterile prep with povidone-iodine solution and alcohol, the third interspace was injected with 0.5cc 2% xylocaine plain, 0.5cc 0.5% marcaine plain, 5mg  triamcinolone acetonide, and 2mg  dexamethasone was injected. The patient tolerated the procedure well without complication.  Encouraged to wear supportive shoe gear.  Prescription for methylprednisolone Dosepak sent to pharmacy   Return in about 4 weeks (around 04/26/2021).

## 2021-04-02 ENCOUNTER — Ambulatory Visit (INDEPENDENT_AMBULATORY_CARE_PROVIDER_SITE_OTHER): Payer: BC Managed Care – PPO | Admitting: Physician Assistant

## 2021-04-02 ENCOUNTER — Other Ambulatory Visit: Payer: Self-pay

## 2021-04-02 ENCOUNTER — Encounter: Payer: Self-pay | Admitting: Physician Assistant

## 2021-04-02 VITALS — BP 146/75 | HR 65 | Ht 66.0 in | Wt 135.0 lb

## 2021-04-02 DIAGNOSIS — R350 Frequency of micturition: Secondary | ICD-10-CM

## 2021-04-02 DIAGNOSIS — N3281 Overactive bladder: Secondary | ICD-10-CM | POA: Diagnosis not present

## 2021-04-02 LAB — MICROSCOPIC EXAMINATION: Bacteria, UA: NONE SEEN

## 2021-04-02 LAB — URINALYSIS, COMPLETE
Bilirubin, UA: NEGATIVE
Glucose, UA: NEGATIVE
Ketones, UA: NEGATIVE
Leukocytes,UA: NEGATIVE
Nitrite, UA: NEGATIVE
Protein,UA: NEGATIVE
Specific Gravity, UA: 1.025 (ref 1.005–1.030)
Urobilinogen, Ur: 0.2 mg/dL (ref 0.2–1.0)
pH, UA: 6 (ref 5.0–7.5)

## 2021-04-02 LAB — BLADDER SCAN AMB NON-IMAGING: Scan Result: 0

## 2021-04-02 NOTE — Progress Notes (Signed)
04/02/2021 11:05 AM   Rose Bernard July 06, 1966 [id number]  CC: Chief Complaint  Patient presents with  . Follow-up    Symptom check after botox    HPI: Rose Bernard is a 55 y.o. 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 Erlene Quan on 02/18/2021, who presents today for symptom recheck and PVR.  Today she reports ongoing nocturnal enuresis and daytime urge incontinence despite her recent intravesical Botox.  She wears a pad throughout the day for security and soaks these.  She has been fluid restricting to reduce her leakage episodes.  Overall, she is frustrated with her urinary symptoms.  CT stone study dated 12/25/2020 with no evidence of urolithiasis.  In-office UA today positive for trace intact blood; urine microscopy pan negative. PVR 78mL.  PMH: Past Medical History:  Diagnosis Date  . Chronic cystitis   . Frequent UTI   . GERD (gastroesophageal reflux disease)   . History of kidney stones   . Incomplete bladder emptying   . 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 ABDOMINAL HYSTERECTOMY  1995   2/2 delivery complications with 3rd child. Hysterectomy and bladder repair with subsequent appendectomy. L Ovary remain in place   . APPENDECTOMY     2001 due to scar tissue  . BLADDER BOTOX  2014, 2015, 2016  . BREAST BIOPSY Bilateral 1991  . BREAST BIOPSY Left 12/12/2019   [place] bx, path pending  . BREAST SURGERY Bilateral 2002   benign mass removed  . COLONOSCOPY  2014  . EXCISION OF BREAST BIOPSY Left 01/10/2020   Procedure: EXCISION OF BREAST BIOPSY w/ Needle localization;  Surgeon: Herbert Pun, MD;  Location: [facility] ORS;  Service: General;  Laterality: Left;  . FOOT SURGERY Bilateral   . LAPAROSCOPIC HYSTERECTOMY  1995  . MASTECTOMY, PARTIAL Left 01/31/2020   Procedure: MASTECTOMY PARTIAL;  Surgeon: Herbert Pun, MD;  Location: [facility] ORS;  Service: General;  Laterality: Left;  . reconstruction of bladder   1995    2/2 delivery complications with 3rd child     Home Medications:  Allergies as of 04/02/2021      Reactions   Penicillin V Potassium Rash   Did it involve swelling of the face/tongue/throat, SOB, or low BP? No Did it involve sudden or severe rash/hives, skin peeling, or any reaction on the inside of your mouth or nose? No Did you need to seek medical attention at a hospital or doctor's office? No When did it last happen?25 years ago If all above answers are "NO", may proceed with cephalosporin use.      Medication List       Accurate as of April 02, 2021 11:05 AM. If you have any questions, ask your nurse or doctor.        STOP taking these medications   methylPREDNISolone 4 MG Tbpk tablet Commonly known as: MEDROL DOSEPAK Stopped by: Debroah Loop, PA-C     TAKE these medications   anastrozole 1 MG tablet Commonly known as: ARIMIDEX Take 1 tablet (1 mg total) by mouth daily.   esomeprazole 20 MG capsule Commonly known as: NEXIUM Take 20 mg by mouth daily as needed (acid reflux).   meloxicam 15 MG tablet Commonly known as: Mobic Take 1 tablet (15 mg total) by mouth daily.   mupirocin ointment 2 % Commonly known as: BACTROBAN Apply 1 application topically 2 (two) times  daily.       Allergies:  Allergies  Allergen Reactions  . Penicillin V Potassium Rash    Did it involve swelling of the face/tongue/throat, SOB, or low BP? No Did it involve sudden or severe rash/hives, skin peeling, or any reaction on the inside of your mouth or nose? No Did you need to seek medical attention at a hospital or doctor's office? No When did it last happen?25 years ago If all above answers are "NO", may proceed with cephalosporin use.     Family History: Family History  Problem Relation Age of Onset  . Colon cancer Father        With ostomy in  place   . Breast cancer Paternal Aunt 15  . Breast cancer Maternal Aunt   . Breast cancer Maternal Aunt     Social History:   reports that she has never smoked. She has never used smokeless tobacco. She reports that she does not drink alcohol and does not use drugs.  Physical Exam: BP (!) 146/75   Pulse 65   Ht 5\' 6"  (1.676 m)   Wt 135 lb (61.2 kg)   LMP 02/07/1994   BMI 21.79 kg/m   Constitutional:  Alert and oriented, no acute distress, nontoxic appearing HEENT: [place], AT Cardiovascular: No clubbing, cyanosis, or edema Respiratory: Normal respiratory effort, no increased work of breathing Skin: No rashes, bruises or suspicious lesions Neurologic: Grossly intact, no focal deficits, moving all 4 extremities Psychiatric: Normal mood and affect  Laboratory Data: Results for orders placed or performed in visit on 04/02/21  Microscopic Examination   Urine  Result Value Ref Range   WBC, UA 0-5 0 - 5 /hpf   RBC 0-2 0 - 2 /hpf   Epithelial Cells (non renal) 0-10 0 - 10 /hpf   Bacteria, UA None seen None seen/Few  Urinalysis, Complete  Result Value Ref Range   Specific Gravity, UA 1.025 1.005 - 1.030   pH, UA 6.0 5.0 - 7.5   Color, UA Yellow Yellow   Appearance Ur Hazy (A) Clear   Leukocytes,UA Negative Negative   Protein,UA Negative Negative/Trace   Glucose, UA Negative Negative   Ketones, UA Negative Negative   RBC, UA Trace (A) Negative   Bilirubin, UA Negative Negative   Urobilinogen, Ur 0.2 0.2 - 1.0 mg/dL   Nitrite, UA Negative Negative   Microscopic Examination See below:   BLADDER SCAN AMB NON-IMAGING  Result Value Ref Range   Scan Result 0 ml    Assessment & Plan:   1. OAB (overactive bladder) Severe refractory OAB wet, now failed intravesical Botox with no evidence of retention, infection, or stones. I recommended follow-up with Dr. Matilde Sprang to discuss InterStim and patient agreed. Appointment made. - Urinalysis, Complete - BLADDER SCAN AMB NON-IMAGING    Return in about 1 week (around 04/09/2021) for OAB visit with Dr. Matilde Sprang to discuss InterStim.  Debroah Loop, PA-C  [facility] [address], [place],  [address] [phone]

## 2021-04-05 ENCOUNTER — Encounter: Payer: Self-pay | Admitting: Urology

## 2021-04-05 ENCOUNTER — Other Ambulatory Visit: Payer: Self-pay

## 2021-04-05 ENCOUNTER — Ambulatory Visit (INDEPENDENT_AMBULATORY_CARE_PROVIDER_SITE_OTHER): Payer: BC Managed Care – PPO | Admitting: Urology

## 2021-04-05 VITALS — BP 133/80 | HR 88 | Wt 135.0 lb

## 2021-04-05 DIAGNOSIS — N3946 Mixed incontinence: Secondary | ICD-10-CM | POA: Diagnosis not present

## 2021-04-05 NOTE — Addendum Note (Signed)
Addended by: Alvera Novel on: 04/05/2021 03:14 PM   Modules accepted: Orders

## 2021-04-05 NOTE — Progress Notes (Signed)
04/05/2021 2:35 PM   Rose Bernard Apr 21, 1966 [id number]  Referring provider: Einar Pheasant, [address] [place],  [place] [address]  Chief Complaint  Patient presents with  . Over Active Bladder    HPI: Dr Erlene Quan: Patient has refractory overactive bladder treated with Botox treatments.  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  She has urinary tract infections.  By history she has tried multiple medications and failed.  Patient has had approximately 2 Botox treatments for about 8 to 10 years.  The last 2 did not work well.  Years ago she was 90% improved and now they are less effective gradually.  Currently she has urge incontinence.  She leaks with coughing sneezing and when she lifts at work.  Both are significant.  She wears 2 pads a day that are soaked.  She has high-volume bedwetting.  She voids every hour depending on fluid intake and cannot hold urination for 2 hours.  She gets up to 3 times a night.  Flow was reasonable  It appears that she had a bladder injury during her childbirth with the reimplantation but has not had a bladder suspension.  She used to get bladder infections  Urine culture February and January 2022 was negative  PMH: Past Medical History:  Diagnosis Date  . Chronic cystitis   . Frequent UTI   . GERD (gastroesophageal reflux disease)   . History of kidney stones   . Incomplete bladder emptying   . 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 ABDOMINAL HYSTERECTOMY  1995   2/2 delivery complications with 3rd child. Hysterectomy and bladder repair with subsequent appendectomy. L Ovary remain in place   . APPENDECTOMY     2001 due to scar tissue  . BLADDER BOTOX  2014, 2015, 2016  . BREAST BIOPSY Bilateral 1991  . BREAST BIOPSY Left 12/12/2019   [place] bx, path pending  . BREAST SURGERY Bilateral 2002   benign mass removed  .  COLONOSCOPY  2014  . EXCISION OF BREAST BIOPSY Left 01/10/2020   Procedure: EXCISION OF BREAST BIOPSY w/ Needle localization;  Surgeon: Herbert Pun, MD;  Location: [facility] ORS;  Service: General;  Laterality: Left;  . FOOT SURGERY Bilateral   . LAPAROSCOPIC HYSTERECTOMY  1995  . MASTECTOMY, PARTIAL Left 01/31/2020   Procedure: MASTECTOMY PARTIAL;  Surgeon: Herbert Pun, MD;  Location: [facility] ORS;  Service: General;  Laterality: Left;  . reconstruction of bladder   1995    2/2 delivery complications with 3rd child     Home Medications:  Allergies as of 04/05/2021      Reactions   Penicillin V Potassium Rash   Did it involve swelling of the face/tongue/throat, SOB, or low BP? No Did it involve sudden or severe rash/hives, skin peeling, or any reaction on the inside of your mouth or nose? No Did you need to seek medical attention at a hospital or doctor's office? No When did it last happen?25 years ago If all above answers are "NO", may proceed with cephalosporin use.      Medication List       Accurate as of April 05, 2021  2:35 PM. If you have any questions, ask your nurse or doctor.        anastrozole 1 MG tablet Commonly known as: ARIMIDEX Take 1 tablet (1 mg total) by mouth daily.   esomeprazole 20 MG capsule Commonly known  as: NEXIUM Take 20 mg by mouth daily as needed (acid reflux).   meloxicam 15 MG tablet Commonly known as: Mobic Take 1 tablet (15 mg total) by mouth daily.   mupirocin ointment 2 % Commonly known as: BACTROBAN Apply 1 application topically 2 (two) times daily.       Allergies:  Allergies  Allergen Reactions  . Penicillin V Potassium Rash    Did it involve swelling of the face/tongue/throat, SOB, or low BP? No Did it involve sudden or severe rash/hives, skin peeling, or any reaction on the inside of your mouth or nose? No Did you need to seek medical attention at a hospital or doctor's office? No When did it last  happen?25 years ago If all above answers are "NO", may proceed with cephalosporin use.     Family History: Family History  Problem Relation Age of Onset  . Colon cancer Father        With ostomy in place   . Breast cancer Paternal Aunt 90  . Breast cancer Maternal Aunt   . Breast cancer Maternal Aunt     Social History:  reports that she has never smoked. She has never used smokeless tobacco. She reports that she does not drink alcohol and does not use drugs.  ROS:                                        Physical Exam: LMP 02/07/1994   Constitutional:  Alert and oriented, No acute distress. HEENT: [place] AT, moist mucus membranes.  Trachea midline, no masses.   Laboratory Data: Lab Results  Component Value Date   WBC 4.8 11/09/2020   HGB 13.3 11/09/2020   HCT 39.8 11/09/2020   MCV 90.1 11/09/2020   PLT 259.0 11/09/2020    Lab Results  Component Value Date   CREATININE 0.68 03/29/2021    No results found for: PSA  No results found for: TESTOSTERONE  Lab Results  Component Value Date   HGBA1C 5.6 10/01/2015    Urinalysis    Component Value Date/Time   COLORURINE YELLOW 02/08/2016 1733   APPEARANCEUR Hazy (A) 04/02/2021 1106   LABSPEC 1.020 02/08/2016 1733   PHURINE 6.0 02/08/2016 1733   GLUCOSEU Negative 04/02/2021 1106   GLUCOSEU NEGATIVE 02/08/2016 1733   HGBUR NEGATIVE 02/08/2016 1733   BILIRUBINUR Negative 04/02/2021 1106   KETONESUR NEGATIVE 02/08/2016 1733   PROTEINUR Negative 04/02/2021 1106   UROBILINOGEN 0.2 03/18/2016 1632   UROBILINOGEN 0.2 02/08/2016 1733   NITRITE Negative 04/02/2021 1106   NITRITE NEGATIVE 02/08/2016 1733   LEUKOCYTESUR Negative 04/02/2021 1106    Pertinent Imaging:   Assessment & Plan: Patient has mixed incontinence.  She also has stress incontinence but it does appear that the urge component is more severe especially with the bedwetting.  The role of urodynamics discussed.  Patient was  inquiring about InterStim but understands why the test was ordered  There are no diagnoses linked to this encounter.  No follow-ups on file.  Reece Packer, MD  [facility] [address] [place], [place] [address] [phone]

## 2021-04-14 NOTE — Telephone Encounter (Signed)
Intermittent FMLA paperwork has been completed and faxed to [employer] on 04/14/2021.

## 2021-04-28 ENCOUNTER — Ambulatory Visit (INDEPENDENT_AMBULATORY_CARE_PROVIDER_SITE_OTHER): Payer: BC Managed Care – PPO | Admitting: Podiatry

## 2021-04-28 ENCOUNTER — Other Ambulatory Visit: Payer: Self-pay

## 2021-04-28 DIAGNOSIS — G5781 Other specified mononeuropathies of right lower limb: Secondary | ICD-10-CM

## 2021-04-28 DIAGNOSIS — G5761 Lesion of plantar nerve, right lower limb: Secondary | ICD-10-CM | POA: Diagnosis not present

## 2021-04-28 NOTE — Patient Instructions (Signed)

## 2021-05-02 ENCOUNTER — Encounter: Payer: Self-pay | Admitting: Podiatry

## 2021-05-02 NOTE — Progress Notes (Signed)
  Subjective:  Patient ID: Rose Bernard, female    DOB: 06-25-1966,  MRN: [id number]  Chief Complaint  Patient presents with  . Foot Pain    4 week follow up right foot pain    55 y.o. female presents with the above complaint. History confirmed with patient.  Probably 60% better after the last injection  Objective:  Physical Exam: warm, good capillary refill, no trophic changes or ulcerative lesions, normal DP and PT pulses and normal sensory exam.  Right Foot: Sharp pain on palpation to the third intermetatarsal space, today she has neuritic symptoms into the toes Radiographs: X-ray of the right foot: no fracture, dislocation, swelling or degenerative changes noted no evidence of stress fracture  Assessment:   1. 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  Suspect she has an intermetatarsal bursitis.  Discussed treatment options for this including injection therapy.  Recommended repeat injection today.  After sterile prep with povidone-iodine solution and alcohol, the third interspace was injected with 0.5cc 2% xylocaine plain, 0.5cc 0.5% marcaine plain, 5mg  triamcinolone acetonide, and 2mg  dexamethasone was injected. The patient tolerated the procedure well without complication.  Encouraged to wear supportive shoe gear.     Return in about 2 months (around 06/28/2021) for neuroma right foot.

## 2021-05-07 DIAGNOSIS — N3946 Mixed incontinence: Secondary | ICD-10-CM | POA: Diagnosis not present

## 2021-05-11 ENCOUNTER — Ambulatory Visit: Payer: Self-pay | Admitting: Physician Assistant

## 2021-05-13 ENCOUNTER — Other Ambulatory Visit: Payer: Self-pay | Admitting: Urology

## 2021-05-17 ENCOUNTER — Ambulatory Visit: Payer: Self-pay | Admitting: Urology

## 2021-05-31 ENCOUNTER — Ambulatory Visit (INDEPENDENT_AMBULATORY_CARE_PROVIDER_SITE_OTHER): Payer: BC Managed Care – PPO | Admitting: Urology

## 2021-05-31 ENCOUNTER — Encounter: Payer: Self-pay | Admitting: Urology

## 2021-05-31 ENCOUNTER — Other Ambulatory Visit: Payer: Self-pay

## 2021-05-31 VITALS — BP 112/70 | HR 71

## 2021-05-31 DIAGNOSIS — N3946 Mixed incontinence: Secondary | ICD-10-CM

## 2021-05-31 NOTE — Progress Notes (Signed)
05/31/2021 11:44 AM   Rose Bernard Apr 06, 1966 [id number]  Referring provider: Einar Pheasant, [address] [place],  [place] [address]  Chief Complaint  Patient presents with   Urinary Incontinence    HPI: Dr Erlene Quan: Patient has refractory overactive bladder treated with Botox treatments.  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  She has urinary tract infections.  By history she has tried multiple medications and failed.   Patient has had approximately 2 Botox treatments for about 8 to 10 years.  The last 2 did not work well.  Years ago she was 90% improved and now they are less effective gradually.  Currently she has urge incontinence.  She leaks with coughing sneezing and when she lifts at work.  Both are significant.  She wears 2 pads a day that are soaked.  She has high-volume bedwetting.   She voids every hour depending on fluid intake and cannot hold urination for 2 hours.  She gets up to 3 times a night.  Flow was reasonable   It appears that she had a bladder injury during her childbirth with the reimplantation but has not had a bladder suspension.  She used to get bladder infections   Urine culture February and January 2022 was negative    Patient has mixed incontinence.  She also has stress incontinence but it does appear that the urge component is more severe especially with the bedwetting.  The role of urodynamics discussed. Patient was inquiring about InterStim but understands why the test was ordered  Today Frequency stable.  Incontinence stable.  Last culture negative On urodynamics patient voided 64 mils of maximal flow of 36 mils per second.  Residual was 50 mils.  Maximum bladder capacity was 327 mL.  Bladder was stable.  There was no stress incontinence with a Valsalva pressure of 71 cm of water.  During voluntary voiding she voided 133 mL with a maximum flow of 5 mils per second.  Maximum voiding pressure 5 cm of water.   Residual 193 mL.  EMG mildly increased during the voiding phase.  She said she leaks more when she is standing up.  Uroflow pattern depressed and prolonged.  The details of the urodynamics are signed and dictated  Ankles are healthy appearing with good vascular supply.  No edema.  Few peripheral veins.  No neuropathy.  No steroids.  Importantly she had a blood clot in her right leg years ago.  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 Hysterectomy and bladder repair with subsequent appendectomy. L Ovary remain in place    APPENDECTOMY     2001 due to scar tissue   BLADDER BOTOX  2014, 2015, 2016   BREAST BIOPSY Bilateral 1991   BREAST BIOPSY Left 12/12/2019   [place] bx, path pending   BREAST SURGERY Bilateral 2002   benign mass removed   COLONOSCOPY  2014   EXCISION OF BREAST BIOPSY Left 01/10/2020   Procedure: EXCISION OF BREAST BIOPSY w/ Needle localization;  Surgeon: Herbert Pun, MD;  Location: [facility];  Service: General;  Laterality: Left;   FOOT SURGERY Bilateral    LAPAROSCOPIC HYSTERECTOMY  1995   MASTECTOMY, PARTIAL Left 01/31/2020   Procedure: MASTECTOMY PARTIAL;  Surgeon: Herbert Pun, MD;  Location: [facility];  Service: General;  Laterality: Left;   reconstruction of bladder   1995    2/2 delivery complications with 3rd child     Home Medications:  Allergies as of 05/31/2021       Reactions   Penicillin V Potassium Rash   Did it involve swelling of the face/tongue/throat, SOB, or low BP? No Did it involve sudden or severe rash/hives, skin peeling, or any reaction  on the inside of your mouth or nose? No Did you need to seek medical attention at a hospital or doctor's office? No When did it last happen?      25 years ago If all above answers are "NO", may proceed with cephalosporin use.        Medication List        Accurate as of May 31, 2021 11:44 AM. If you have any questions, ask your nurse or doctor.          STOP taking these medications    anastrozole 1 MG tablet Commonly known as: ARIMIDEX Stopped by: Reece Packer, MD   esomeprazole 20 MG capsule Commonly known as: [place] Stopped by: Reece Packer, MD   exemestane 25 MG tablet Commonly known as: AROMASIN Stopped by: Reece Packer, MD   meloxicam 15 MG tablet Commonly known as: Mobic Stopped by: Reece Packer, MD   mupirocin ointment 2 % Commonly known as: BACTROBAN Stopped by: Reece Packer, MD        Allergies:  Allergies  Allergen Reactions   Penicillin V Potassium Rash    Did it involve swelling of the face/tongue/throat, SOB, or low BP? No Did it involve sudden or severe rash/hives, skin peeling, or any reaction on the inside of your mouth or nose? No Did you need to seek medical attention at a hospital or doctor's office? No When did it last happen?      25 years ago If all above answers are "NO", may proceed with cephalosporin use.     Family History: Family History  Problem Relation Age of Onset   Colon cancer Father        With ostomy in place    Breast cancer Paternal Aunt 68   Breast cancer Maternal Aunt    Breast cancer Maternal Aunt     Social History:  reports that she has never smoked. She has never used smokeless tobacco. She reports that she does not drink alcohol and does not use drugs.  ROS:                                        Physical Exam: BP 112/70   Pulse 71   LMP 02/07/1994   Constitutional:  Alert and oriented, No acute distress. HEENT: [place] AT, moist mucus membranes.   Trachea midline, no masses. Cardiovascular: No clubbing, cyanosis, or edema.   Laboratory Data: Lab Results  Component Value Date   WBC 4.8 11/09/2020   HGB 13.3 11/09/2020   HCT 39.8 11/09/2020   MCV 90.1 11/09/2020   PLT 259.0 11/09/2020    Lab Results  Component Value Date   CREATININE 0.68 03/29/2021    No  results found for: PSA  No results found for: TESTOSTERONE  Lab Results  Component Value Date   HGBA1C 5.6 10/01/2015    Urinalysis    Component Value Date/Time   COLORURINE YELLOW 02/08/2016 1733   APPEARANCEUR Hazy (A) 04/02/2021 1106   LABSPEC 1.020 02/08/2016 1733   PHURINE 6.0 02/08/2016 1733   GLUCOSEU Negative 04/02/2021 1106   GLUCOSEU NEGATIVE 02/08/2016 1733   HGBUR NEGATIVE 02/08/2016 1733   BILIRUBINUR Negative 04/02/2021 1106   KETONESUR NEGATIVE 02/08/2016 1733   PROTEINUR Negative 04/02/2021 1106   UROBILINOGEN 0.2 03/18/2016 1632   UROBILINOGEN 0.2 02/08/2016 1733   NITRITE Negative 04/02/2021 1106   NITRITE NEGATIVE 02/08/2016 1733   LEUKOCYTESUR Negative 04/02/2021 1106    Pertinent Imaging:   Assessment & Plan: She has refractory urgency incontinence.  She may have mild stress incontinence not demonstrated on urodynamics.  She has failed Botox.  She has failed a number of medications over the years.  We talked about sacral nerve stimulation with usual template.  I did not have samples of the new beta 3 agonist.  Handout of percutaneous tibial nerve stimulation and InterStim given.  Full templates of both discussed  I talked about the new FDA approved ankle implant and she does have private insurance that is changing.  We talked about efficacy and risks including infection, wound issues, pain, migration and sequelae, battery life 3 years approximate and changing hopefully to 5 years, MRI issues and activity issues.  I talked about the study and she understands that the battery would be rechargeable.  She would like to be called  regarding it.  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  There is one machine that if she has a foreign body with metal in her body she would need a letter saying that she cannot go in it with either InterStim or ankle implant.  She would need to be off for approximately 2 weeks to avoid heavy lifting and working she does at work.  We would use left leg as noted  I will have my office call her.  There are no diagnoses linked to this encounter.  No follow-ups on file.  Reece Packer, MD  [facility] [address], [place], [place] [address] [phone]

## 2021-06-16 ENCOUNTER — Telehealth: Payer: Self-pay | Admitting: Urology

## 2021-06-16 NOTE — Telephone Encounter (Signed)
Patient called the office today asking for next steps.  She states that interstim is not an option for her - her job will not allow it and she cannot afford to not work.  She states that she is still having problems and wants to see if there are other options for her.   Please advise.

## 2021-06-21 ENCOUNTER — Other Ambulatory Visit: Payer: Self-pay

## 2021-06-21 ENCOUNTER — Encounter: Payer: Self-pay | Admitting: Podiatry

## 2021-06-21 ENCOUNTER — Ambulatory Visit (INDEPENDENT_AMBULATORY_CARE_PROVIDER_SITE_OTHER): Payer: BC Managed Care – PPO | Admitting: Podiatry

## 2021-06-21 DIAGNOSIS — G5781 Other specified mononeuropathies of right lower limb: Secondary | ICD-10-CM

## 2021-06-21 DIAGNOSIS — G5761 Lesion of plantar nerve, right lower limb: Secondary | ICD-10-CM

## 2021-06-21 DIAGNOSIS — M7661 Achilles tendinitis, right leg: Secondary | ICD-10-CM

## 2021-06-21 NOTE — Progress Notes (Signed)
  Subjective:  Patient ID: Rose Bernard, female    DOB: 1966-05-07,  MRN: [id number]  Chief Complaint  Patient presents with   Neuroma    Right foot, third interspace    55 y.o. female returns for follow-up with the above complaint. History confirmed with patient.  Once again doing better only really intermittent at this point.  Has some pain in the back of the Achilles as well  Objective:  Physical Exam: warm, good capillary refill, no trophic changes or ulcerative lesions, normal DP and PT pulses and normal sensory exam.  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 Achilles tendinitis, right leg      Plan:  Patient was evaluated and treated and all questions answered.  Overall doing fairly well.  No injection was performed today.  Still intermittently bothersome.  She will return as needed for further injections becomes worse.  Discussed with her treatment options for Achilles tendinitis versus currently mild and I recommended over-the-counter roll-on Aleve topical and begin stretching and therapy exercises which I dispensed for her.  Return as needed for this as well.   Return if symptoms worsen or fail to improve.

## 2021-06-21 NOTE — Patient Instructions (Signed)

## 2021-06-21 NOTE — Telephone Encounter (Signed)
Patient would like to try the  PTNS . Monday at 3.00 if approved

## 2021-06-30 ENCOUNTER — Telehealth: Payer: Self-pay

## 2021-06-30 NOTE — Telephone Encounter (Signed)
No prior auth required for in network provider services. Call reference # [id number]. Patient scheduled for 12 PTNS visits. [facility] notifying patient.

## 2021-07-05 ENCOUNTER — Ambulatory Visit: Payer: Self-pay

## 2021-07-07 ENCOUNTER — Ambulatory Visit
Admission: EM | Admit: 2021-07-07 | Discharge: 2021-07-07 | Disposition: A | Discharge status: Home / Self Care | Payer: BC Managed Care – PPO | Attending: Family Medicine | Admitting: Family Medicine

## 2021-07-07 ENCOUNTER — Telehealth: Payer: Self-pay | Admitting: Urology

## 2021-07-07 ENCOUNTER — Other Ambulatory Visit: Payer: Self-pay

## 2021-07-07 DIAGNOSIS — R519 Headache, unspecified: Secondary | ICD-10-CM | POA: Insufficient documentation

## 2021-07-07 DIAGNOSIS — Z20822 Contact with and (suspected) exposure to covid-19: Secondary | ICD-10-CM | POA: Diagnosis not present

## 2021-07-07 NOTE — Discharge Instructions (Addendum)
Rest.  Lots of fluids.  Test result will be back tomorrow.  Take care  Dr. Lacinda Axon

## 2021-07-07 NOTE — ED Provider Notes (Signed)
[facility]    CSN: [id number] Arrival date & time: 07/07/21  1324      History   Chief Complaint Chief Complaint  Patient presents with   Headache   HPI 55 year old female presents for the above complaint.  Grandson is positive for COVID-19.  He lives with her and her husband.  She states that she has developed symptoms as of today.  She states that she has had some "drainage" and also headache.  No fever.  No other associated symptoms.  She is overall well-appearing and has no other complaints at this time.  Desires COVID testing.  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     Patient Active Problem List   Diagnosis Date Noted   Pain in Achilles tendon 03/11/2021   Chronic venous insufficiency 11/30/2020   Lymphedema 11/10/2020   Lab test positive for detection of COVID-19 virus 07/31/2020   Family history of thrombosis 04/28/2020   Family history of cancer 04/28/2020   Thrombophlebitis of superficial veins of left lower extremity 04/28/2020   URI (upper respiratory infection) 04/27/2020   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   Internal nasal lesion 11/24/2019   Foot pain, right 11/24/2019   GERD (gastroesophageal reflux disease) 11/24/2019   Abdominal pain 09/15/2019   Chest pain 09/15/2019   Stress 04/23/2018   Anemia 03/31/2018   Shingles 01/18/2018   Gross hematuria 10/24/2017   Nephrolithiasis [id number]   Renal colic [id number]   Left carotid bruit 08/02/2017   Hot flashes 02/09/2016   Loss of weight 10/02/2015   Myofascial pain 09/16/2015   Chronic female pelvic pain 09/16/2015   Abdominal pain, left lower quadrant 09/07/2015   Abnormal mammogram 04/12/2015   Health care maintenance 04/12/2015   Sacral pain 12/29/2013   Urinary frequency 05/15/2013    Urge incontinence of urine 05/15/2013   UTI (urinary tract infection) 02/27/2013   Family history of colon cancer 02/20/2013   Headache 02/11/2013   Chronic cystitis 02/11/2013   History of ovarian cyst 02/11/2013   Reflux, vesicoureteral 10/15/2012   Symptoms involving urinary system 10/15/2012   Mixed incontinence 10/15/2012   Incomplete bladder emptying 10/15/2012   Disorder of bladder function 10/15/2012    Past Surgical History:  Procedure Laterality Date   ABDOMINAL HYSTERECTOMY  1995   2/2 delivery complications with 3rd child. Hysterectomy and bladder repair with subsequent appendectomy. L Ovary remain in place    APPENDECTOMY     2001 due to scar tissue   BLADDER BOTOX  2014, 2015, 2016   BREAST BIOPSY Bilateral 1991   BREAST BIOPSY Left 12/12/2019   [place] bx, path pending   BREAST SURGERY Bilateral 2002   benign mass removed   COLONOSCOPY  2014   EXCISION OF BREAST BIOPSY Left 01/10/2020   Procedure: EXCISION OF BREAST BIOPSY w/ Needle localization;  Surgeon: Herbert Pun, MD;  Location: [facility] OR[facility];  Service: General;  Laterality: Left;   FOOT SURGERY Bilateral    [place], PARTIAL Left 01/31/2020   Procedure: MASTECTOMY PARTIAL;  Surgeon: Herbert Pun, MD;  Location: [facility] OR[facility];  Service: General;  Laterality: Left;   reconstruction of bladder   1995    2/2 delivery complications with 3rd child     OB History  Gravida  3   Para  2   Term      Preterm      AB      Living  2      SAB      IAB      Ectopic      Multiple      Live Births           Obstetric Comments  One still born 1st Menstrual Cycle:  63  1st Pregnancy:  24           Home Medications    Prior to Admission medications   Not on File    Family History Family History  Problem Relation Age of Onset   Colon cancer Father        With ostomy in place    Breast cancer Paternal Aunt 58   Breast cancer Maternal Aunt     Breast cancer Maternal Aunt     Social History Social History   Tobacco Use   Smoking status: Never   Smokeless tobacco: Never  Vaping Use   Vaping Use: Never used  Substance Use Topics   Alcohol use: No    Alcohol/week: 0.0 standard drinks   Drug use: No     Allergies   Penicillin v potassium   Review of Systems Review of Systems Per HPI  Physical Exam Triage Vital Signs ED Triage Vitals  Enc Vitals Group     BP 07/07/21 1333 124/64     Pulse Rate 07/07/21 1333 67     Resp 07/07/21 1333 17     Temp 07/07/21 1333 98.2 F (36.8 C)     Temp Source 07/07/21 1333 Oral     SpO2 07/07/21 1333 100 %     Weight 07/07/21 1332 140 lb (63.5 kg)     Height --      Head Circumference --      Peak Flow --      Pain Score 07/07/21 1332 0     Pain Loc --      Pain Edu? --      Excl. in [place]? --    Updated Vital Signs BP 124/64 (BP Location: Left Arm)   Pulse 67   Temp 98.2 F (36.8 C) (Oral)   Resp 17   Wt 63.5 kg   LMP 02/07/1994   SpO2 100%   BMI 22.60 kg/m   Visual Acuity Right Eye Distance:   Left Eye Distance:   Bilateral Distance:    Right Eye Near:   Left Eye Near:    Bilateral Near:     Physical Exam Constitutional:      General: She is not in acute distress.    Appearance: Normal appearance. She is not ill-appearing.  HENT:     Head: Normocephalic and atraumatic.     Mouth/Throat:     Pharynx: Posterior oropharyngeal erythema present. No oropharyngeal exudate.  Cardiovascular:     Rate and Rhythm: Normal rate and regular rhythm.     Heart sounds: No murmur heard. Pulmonary:     Effort: Pulmonary effort is normal.     Breath sounds: Normal breath sounds. No wheezing, rhonchi or rales.  Neurological:     Mental Status: She is alert.  Psychiatric:        Mood and Affect: Mood normal.        Behavior: Behavior normal.     UC Treatments / Results  Labs (all labs ordered are listed,  but only abnormal results are displayed) Labs Reviewed   SARS CORONAVIRUS 2 (TAT 6-24 HRS)    EKG   Radiology No results found.  Procedures Procedures (including critical care time)  Medications Ordered in UC Medications - No data to display  Initial Impression / Assessment and Plan / UC Course  I have reviewed the triage vital signs and the nursing notes.  Pertinent labs & imaging results that were available during my care of the patient were reviewed by me and considered in my medical decision making (see chart for details).    55 year old female presents with respiratory settings in the setting of COVID exposure.  Her symptoms are mild currently.  She is overall well-appearing.  Awaiting COVID test results.  Advised supportive care.  Final Clinical Impressions(s) / UC Diagnoses   Final diagnoses:  Acute nonintractable headache, unspecified headache type  Close exposure to COVID-19 virus     Discharge Instructions      Rest.  Lots of fluids.  Test result will be back tomorrow.  Take care  Dr. Lacinda Axon    ED Prescriptions   None    PDMP not reviewed this encounter.   Coral Spikes, [place] 07/07/21 [phone]

## 2021-07-07 NOTE — ED Triage Notes (Signed)
Pt here for headache and nasal congestion. Exposed to covid. Symptoms started today

## 2021-07-07 NOTE — Telephone Encounter (Signed)
Pt called office and is concerned that we verified insurance incorrectly for her PTNS treatments.  She said BCBS is primary and [place] is secondary.  She wanted to make sure this was accurate before starting her PTNS on Monday.  She got the reminder call.

## 2021-07-08 LAB — SARS CORONAVIRUS 2 (TAT 6-24 HRS): SARS Coronavirus 2: NEGATIVE

## 2021-07-08 NOTE — Telephone Encounter (Signed)
Spoke to [place] at [employer]. No PA required for performed and billed in office treatments. Call reference # [id number].   Benefits and coverage: BCBS to pay 80% after deductible is met. No copay. Patient deductible of $3,000 is satisfied. Max out of pocket is $6,600 of which $3,783.61 has been met. Call reference # [id number].  Patient notified, verbalized understanding.

## 2021-07-10 ENCOUNTER — Other Ambulatory Visit: Payer: Self-pay

## 2021-07-10 ENCOUNTER — Ambulatory Visit
Admission: EM | Admit: 2021-07-10 | Discharge: 2021-07-10 | Disposition: A | Discharge status: Home / Self Care | Payer: BC Managed Care – PPO | Attending: Family Medicine | Admitting: Family Medicine

## 2021-07-10 ENCOUNTER — Encounter: Payer: Self-pay | Admitting: Emergency Medicine

## 2021-07-10 DIAGNOSIS — U071 COVID-19: Secondary | ICD-10-CM | POA: Insufficient documentation

## 2021-07-10 DIAGNOSIS — J069 Acute upper respiratory infection, unspecified: Secondary | ICD-10-CM | POA: Insufficient documentation

## 2021-07-10 LAB — SARS CORONAVIRUS 2 (TAT 6-24 HRS): SARS Coronavirus 2: POSITIVE — AB

## 2021-07-10 MED ORDER — BENZONATATE 100 MG PO CAPS: 100.0000 mg | ORAL_CAPSULE | Freq: Three times a day (TID) | ORAL | 0 refills | Status: DC | PRN

## 2021-07-10 NOTE — ED Triage Notes (Signed)
Patient c/o cough and congestion since Thursday.  Patient was seen her on 07/07/21 and COVID test was negative.  Patient also reports inus congestion and pressure that started yesterday along with a low grade fever of 99.5.

## 2021-07-10 NOTE — Discharge Instructions (Addendum)
Your COVID test is pending.  You should self quarantine until the test result is back.    Take the prescribed Tessalon as needed for cough.  Take Tylenol or ibuprofen as needed for fever or discomfort.  Rest and keep yourself hydrated.    Follow-up with your primary care provider if your symptoms are not improving.

## 2021-07-10 NOTE — ED Provider Notes (Signed)
[facility]    CSN: [id number] Arrival date & time: 07/10/21  0904      History   Chief Complaint Chief Complaint  Patient presents with   Cough   Sinus Problem    HPI Rose Bernard is a 55 y.o. female.  Patient presents with 3-day history of cough, postnasal drip, sinus congestion.  She reports low-grade fever of 99.5 yesterday.  She denies rash, sore throat, shortness of breath, vomiting, diarrhea, or other symptoms.  Treatment attempted at home with OTC cold medication.    The history is provided by the patient and medical records.   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     Patient Active Problem List   Diagnosis Date Noted   Pain in Achilles tendon 03/11/2021   Chronic venous insufficiency 11/30/2020   Lymphedema 11/10/2020   Lab test positive for detection of COVID-19 virus 07/31/2020   Family history of thrombosis 04/28/2020   Family history of cancer 04/28/2020   Thrombophlebitis of superficial veins of left lower extremity 04/28/2020   URI (upper respiratory infection) 04/27/2020   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   Internal nasal lesion 11/24/2019   Foot pain, right 11/24/2019   GERD (gastroesophageal reflux disease) 11/24/2019   Abdominal pain 09/15/2019   Chest pain 09/15/2019   Stress 04/23/2018   Anemia 03/31/2018   Shingles 01/18/2018   Gross hematuria 10/24/2017   Nephrolithiasis [id number]   Renal colic [id number]   Left carotid bruit 08/02/2017   Hot flashes 02/09/2016   Loss of weight 10/02/2015   Myofascial pain 09/16/2015   Chronic female pelvic pain 09/16/2015   Abdominal pain, left lower quadrant 09/07/2015   Abnormal mammogram 04/12/2015   Health care maintenance 04/12/2015   Sacral pain 12/29/2013   Urinary frequency  05/15/2013   Urge incontinence of urine 05/15/2013   UTI (urinary tract infection) 02/27/2013   Family history of colon cancer 02/20/2013   Headache 02/11/2013   Chronic cystitis 02/11/2013   History of ovarian cyst 02/11/2013   Reflux, vesicoureteral 10/15/2012   Symptoms involving urinary system 10/15/2012   Mixed incontinence 10/15/2012   Incomplete bladder emptying 10/15/2012   Disorder of bladder function 10/15/2012    Past Surgical History:  Procedure Laterality Date   ABDOMINAL HYSTERECTOMY  1995   2/2 delivery complications with 3rd child. Hysterectomy and bladder repair with subsequent appendectomy. L Ovary remain in place    APPENDECTOMY     2001 due to scar tissue   BLADDER BOTOX  2014, 2015, 2016   BREAST BIOPSY Bilateral 1991   BREAST BIOPSY Left 12/12/2019   [place] bx, path pending   BREAST SURGERY Bilateral 2002   benign mass removed   COLONOSCOPY  2014   EXCISION OF BREAST BIOPSY Left 01/10/2020   Procedure: EXCISION OF BREAST BIOPSY w/ Needle localization;  Surgeon: Herbert Pun, MD;  Location: [facility] OR[facility];  Service: General;  Laterality: Left;   FOOT SURGERY Bilateral    [place], PARTIAL Left 01/31/2020   Procedure: MASTECTOMY PARTIAL;  Surgeon: Herbert Pun, MD;  Location: [facility] OR[facility];  Service: General;  Laterality: Left;   reconstruction of bladder   1995    2/2 delivery complications with 3rd child     OB History  Gravida  3   Para  2   Term      Preterm      AB      Living  2      SAB      IAB      Ectopic      Multiple      Live Births           Obstetric Comments  One still born 1st Menstrual Cycle:  63  1st Pregnancy:  24           Home Medications    Prior to Admission medications   Medication Sig Start Date End Date Taking? Authorizing Provider  benzonatate (TESSALON) 100 MG capsule Take 1 capsule (100 mg total) by mouth 3 (three) times daily as needed for cough.  07/10/21  Yes Sharion Balloon, NP    Family History Family History  Problem Relation Age of Onset   Colon cancer Father        With ostomy in place    Breast cancer Paternal Aunt 83   Breast cancer Maternal Aunt    Breast cancer Maternal Aunt     Social History Social History   Tobacco Use   Smoking status: Never   Smokeless tobacco: Never  Vaping Use   Vaping Use: Never used  Substance Use Topics   Alcohol use: No    Alcohol/week: 0.0 standard drinks   Drug use: No     Allergies   Penicillin v potassium   Review of Systems Review of Systems  Constitutional:  Negative for chills and fever.  HENT:  Positive for congestion and postnasal drip. Negative for ear pain and sore throat.   Respiratory:  Positive for cough. Negative for shortness of breath.   Cardiovascular:  Negative for chest pain and palpitations.  Gastrointestinal:  Negative for abdominal pain and vomiting.  Skin:  Negative for color change and rash.  All other systems reviewed and are negative.   Physical Exam Triage Vital Signs ED Triage Vitals  Enc Vitals Group     BP 07/10/21 0928 130/64     Pulse Rate 07/10/21 0928 75     Resp 07/10/21 0928 14     Temp 07/10/21 0928 98.4 F (36.9 C)     Temp Source 07/10/21 0928 Oral     SpO2 07/10/21 0928 100 %     Weight 07/10/21 0925 140 lb (63.5 kg)     Height 07/10/21 0925 '5\' 7"'$  (1.702 m)     Head Circumference --      Peak Flow --      Pain Score 07/10/21 0925 7     Pain Loc --      Pain Edu? --      Excl. in [place]? --    No data found.  Updated Vital Signs BP 130/64 (BP Location: Left Arm)   Pulse 75   Temp 98.4 F (36.9 C) (Oral)   Resp 14   Ht '5\' 7"'$  (1.702 m)   Wt 140 lb (63.5 kg)   LMP 02/07/1994   SpO2 100%   BMI 21.93 kg/m   Visual Acuity Right Eye Distance:   Left Eye Distance:   Bilateral Distance:    Right Eye Near:   Left Eye Near:    Bilateral Near:     Physical Exam Vitals and nursing note reviewed.  Constitutional:       General: She is not in acute distress.    Appearance:  She is well-developed. She is not ill-appearing.  HENT:     Head: Normocephalic and atraumatic.     Right Ear: Tympanic membrane normal.     Left Ear: Tympanic membrane normal.     Nose: Nose normal.     Mouth/Throat:     Mouth: Mucous membranes are moist.     Pharynx: Oropharynx is clear.  Eyes:     Conjunctiva/sclera: Conjunctivae normal.  Cardiovascular:     Rate and Rhythm: Normal rate and regular rhythm.     Heart sounds: Normal heart sounds.  Pulmonary:     Effort: Pulmonary effort is normal. No respiratory distress.     Breath sounds: Normal breath sounds.  Abdominal:     Palpations: Abdomen is soft.     Tenderness: There is no abdominal tenderness.  Musculoskeletal:     Cervical back: Neck supple.  Skin:    General: Skin is warm and dry.  Neurological:     General: No focal deficit present.     Mental Status: She is alert and oriented to person, place, and time.     Gait: Gait normal.  Psychiatric:        Mood and Affect: Mood normal.        Behavior: Behavior normal.     UC Treatments / Results  Labs (all labs ordered are listed, but only abnormal results are displayed) Labs Reviewed  SARS CORONAVIRUS 2 (TAT 6-24 HRS)    EKG   Radiology No results found.  Procedures Procedures (including critical care time)  Medications Ordered in UC Medications - No data to display  Initial Impression / Assessment and Plan / UC Course  I have reviewed the triage vital signs and the nursing notes.  Pertinent labs & imaging results that were available during my care of the patient were reviewed by me and considered in my medical decision making (see chart for details).   Viral URI with cough.  COVID pending.  Instructed patient to self quarantine per CDC guidelines.  Discussed symptomatic treatment including Tessalon Perles, Tylenol or ibuprofen, rest, hydration.  Instructed patient to follow up with PCP if  symptoms are not improving.  Patient agrees to plan of care.    Final Clinical Impressions(s) / UC Diagnoses   Final diagnoses:  Viral URI with cough     Discharge Instructions      Your COVID test is pending.  You should self quarantine until the test result is back.    Take the prescribed Tessalon as needed for cough.  Take Tylenol or ibuprofen as needed for fever or discomfort.  Rest and keep yourself hydrated.    Follow-up with your primary care provider if your symptoms are not improving.         ED Prescriptions     Medication Sig Dispense Auth. Provider   benzonatate (TESSALON) 100 MG capsule Take 1 capsule (100 mg total) by mouth 3 (three) times daily as needed for cough. 21 capsule Sharion Balloon, NP      PDMP not reviewed this encounter.   Sharion Balloon, NP 07/10/21 [phone]

## 2021-07-11 ENCOUNTER — Encounter: Payer: Self-pay | Admitting: Internal Medicine

## 2021-07-12 ENCOUNTER — Encounter: Payer: Self-pay | Admitting: Internal Medicine

## 2021-07-12 ENCOUNTER — Ambulatory Visit: Payer: BC Managed Care – PPO | Admitting: Internal Medicine

## 2021-07-12 ENCOUNTER — Ambulatory Visit: Payer: Self-pay

## 2021-07-12 MED ORDER — NYSTATIN 100000 UNIT/ML MT SUSP: OROMUCOSAL | 0 refills | Status: DC

## 2021-07-12 NOTE — Telephone Encounter (Signed)
Rx sent in for nystatin suspension.  Keep [place] posted.  Per note, was seen 07/10/21.  Let us know if feel needs a f/u appt regarding covid.

## 2021-07-12 NOTE — Telephone Encounter (Signed)
Placed call to pt. Pt states she has rescheduled the appts and also confirmed her only allergy is to penicillin.

## 2021-07-12 NOTE — Telephone Encounter (Signed)
Due to our protocol, we can change his visit to a follow up virtual visit and reschedule his physical.  Given she just tested positive yesterday, he would still test positive and would advise him to be retested in a few days.  Follow quarantine guidelines.  I can go over all of this with him at virtual appt as well.  Regarding her throat, confirm only allergy is penicillin.  If so, can call her in nystatin suspension - swish and spit tid to see if helps her throat.  Just let me know.

## 2021-07-13 ENCOUNTER — Telehealth: Payer: BC Managed Care – PPO | Admitting: Internal Medicine

## 2021-07-13 NOTE — Telephone Encounter (Signed)
Duplicate message. See previous message.

## 2021-07-13 NOTE — Telephone Encounter (Signed)
Reviewed.  She was see at [facility] and diagnosed with covid.  I gave her nystatin suspension to help with sore throat.  She is taking mucinex for head congestion and I do recommend continuing this medication.  She can also use saline nasal spray - flush nose two times per day and nasacort nasal spray - 2 sprays each nostril one time per day.  Do this in the evening.  If persistent or worsening symptoms or any sob, chest pain, etc needs to be seen.

## 2021-07-13 NOTE — Telephone Encounter (Signed)
Called patient she stated she is breathing fine, just that she has pressure in her face she will try providers recommendations and will have someone pick up meds recommended .

## 2021-07-15 ENCOUNTER — Encounter: Payer: Self-pay | Admitting: Internal Medicine

## 2021-07-15 NOTE — Telephone Encounter (Signed)
Please call Rose Bernard and confirm doing ok.  Also, regarding her question about when to get booster after having covid, here is what is recommended.  Individuals with recent, documented covid infection should have at least recovered from acute infection and met criteria for discontinuation of isolation precautions before receiving a vaccine dose. Additionally, given the low risk of reinfection soon after prior infection, it is reasonable for individuals with covid infection to wait to receive a vaccine dose until three months after infection.  Let me know if any questions.

## 2021-07-19 ENCOUNTER — Ambulatory Visit: Payer: Self-pay

## 2021-07-20 NOTE — Telephone Encounter (Signed)
Patient is aware of below. Confirmed that she is doing ok. No lingering symptoms. No signs of active acute infection. She says her grandson had covid the same time as she did and he was told that he could get his booster on/after 8/18. She tested positive on 7/31 and symptoms started 7/28-7/29. Would you prefer her wait longer than 8/18?

## 2021-07-20 NOTE — Telephone Encounter (Signed)
Patient is aware of below. 

## 2021-07-20 NOTE — Telephone Encounter (Signed)
See previous note - regarding guidelines. If she wants to get the vaccine now, she needs to be over the infection, out of quarantine, etc.  See note.  Risk of reinfection in first 90 days is low, soe some people are electing to wait 90 days.

## 2021-07-26 ENCOUNTER — Other Ambulatory Visit: Payer: Self-pay

## 2021-07-26 ENCOUNTER — Ambulatory Visit (INDEPENDENT_AMBULATORY_CARE_PROVIDER_SITE_OTHER): Payer: BC Managed Care – PPO

## 2021-07-26 ENCOUNTER — Encounter: Payer: Self-pay | Admitting: Internal Medicine

## 2021-07-26 DIAGNOSIS — N3281 Overactive bladder: Secondary | ICD-10-CM

## 2021-07-26 NOTE — Patient Instructions (Signed)
Tracking Your Bladder Symptoms    Patient Name:___________________________________________________   Sample: Day   Daytime Voids  Nighttime Voids Urgency for the Day(0-4) Number of Accidents Beverage Comments  Monday IIII II 2 I Water IIII Coffee  I      Week Starting:____________________________________   Day Daytime  Voids Nighttime  Voids Urgency for  The Day(0-4) Number of Accidents Beverages Comments                                                           This week my symptoms were:  O much better  O better O the same O worse   

## 2021-07-26 NOTE — Progress Notes (Signed)
PTNS  Session # 1 of 45  Health & Social Factors: noticeable swelling in legs, patient states this is normal  Caffeine: 0 (Decaf tea 1 glass daily) Alcohol: 0 Daytime voids #per day: 7-8 Night-time voids #per night: 1 Urgency: severe Incontinence Episodes #per day: 0-1 Ankle used: left (Hx of DVT in right leg) Treatment Setting: 11 Feeling/ Response: both Comments: patient tolerated well, consent discussed and signed at visit  Performed By: Fonnie Jarvis, CMA   Follow Up: 1 week

## 2021-07-27 NOTE — Telephone Encounter (Signed)
PT called to get a status on the form. States that it was sent over today at 3:45pm.

## 2021-07-27 NOTE — Telephone Encounter (Signed)
Holding until form is received

## 2021-07-28 ENCOUNTER — Telehealth: Payer: Self-pay | Admitting: Internal Medicine

## 2021-07-28 ENCOUNTER — Encounter: Payer: Self-pay | Admitting: Internal Medicine

## 2021-07-28 NOTE — Telephone Encounter (Signed)
I have form. Will complete. Patient has been advised

## 2021-07-28 NOTE — Telephone Encounter (Signed)
Left voicemail to notify patient that we have 5 days to complete this type of form & that Dr. Nicki Reaper has been out of the office until yesterday. I also informed patient by voicemail that we will call her or send a mychart once it has been completed.

## 2021-07-28 NOTE — Telephone Encounter (Signed)
Patient is calling to check on the status of her short term disability form.Please call her or send a MyChart message when it is ready.

## 2021-07-28 NOTE — Telephone Encounter (Signed)
I have not seen this.  Do you have this form.  There should be a previous one in the chart.

## 2021-07-30 ENCOUNTER — Encounter: Payer: Self-pay | Admitting: Internal Medicine

## 2021-07-30 NOTE — Telephone Encounter (Signed)
LM to call back, need her return to work date to put on form

## 2021-07-30 NOTE — Telephone Encounter (Signed)
Left message for patient to return call back.  

## 2021-07-30 NOTE — Telephone Encounter (Signed)
Please call her and get her return to work date for her form I am completing

## 2021-07-30 NOTE — Telephone Encounter (Signed)
Patient called back [place]. Patient says Dr Nicki Reaper should fill out the [employer] form.

## 2021-07-30 NOTE — Telephone Encounter (Signed)
PT called to return the missed call 

## 2021-08-01 ENCOUNTER — Encounter: Payer: Self-pay | Admitting: Internal Medicine

## 2021-08-02 ENCOUNTER — Other Ambulatory Visit: Payer: Self-pay

## 2021-08-02 ENCOUNTER — Ambulatory Visit (INDEPENDENT_AMBULATORY_CARE_PROVIDER_SITE_OTHER): Payer: BC Managed Care – PPO | Admitting: Family Medicine

## 2021-08-02 DIAGNOSIS — N3281 Overactive bladder: Secondary | ICD-10-CM

## 2021-08-02 NOTE — Telephone Encounter (Signed)
Back to work date is 07-19-21

## 2021-08-02 NOTE — Telephone Encounter (Signed)
Please see my previous message for my response.  Notify Terri.  Let me know if any questions.

## 2021-08-02 NOTE — Patient Instructions (Signed)
Tracking Your Bladder Symptoms    Patient Name:___________________________________________________   Sample: Day   Daytime Voids  Nighttime Voids Urgency for the Day(0-4) Number of Accidents Beverage Comments  Monday IIII II 2 I Water IIII Coffee  I      Week Starting:____________________________________   Day Daytime  Voids Nighttime  Voids Urgency for  The Day(0-4) Number of Accidents Beverages Comments                                                           This week my symptoms were:  O much better  O better O the same O worse   

## 2021-08-02 NOTE — Progress Notes (Signed)
PTNS  Session # 2 of 45  Health & Social Factors: no change Caffeine: 0-1 Alcohol: 0 Daytime voids #per day: 6-7 Night-time voids #per night: 1 Urgency: severe Incontinence Episodes #per day: 1 Ankle used: left Treatment Setting: 2 Feeling/ Response: sensory Comments: patient tolerated well  Performed By: Elberta Leatherwood, CMA  Follow Up: 1 week 3 of 45

## 2021-08-04 ENCOUNTER — Encounter: Payer: Self-pay | Admitting: Internal Medicine

## 2021-08-04 ENCOUNTER — Ambulatory Visit (INDEPENDENT_AMBULATORY_CARE_PROVIDER_SITE_OTHER): Payer: BC Managed Care – PPO | Admitting: Internal Medicine

## 2021-08-04 DIAGNOSIS — K219 Gastro-esophageal reflux disease without esophagitis: Secondary | ICD-10-CM

## 2021-08-04 DIAGNOSIS — D0512 Intraductal carcinoma in situ of left breast: Secondary | ICD-10-CM

## 2021-08-04 DIAGNOSIS — R079 Chest pain, unspecified: Secondary | ICD-10-CM

## 2021-08-04 DIAGNOSIS — F439 Reaction to severe stress, unspecified: Secondary | ICD-10-CM

## 2021-08-04 NOTE — Telephone Encounter (Signed)
Patient calling to check status of the form. States she only has a short window to get this turned.   Patient would like a call back at [phone].

## 2021-08-04 NOTE — Telephone Encounter (Signed)
See phone note

## 2021-08-04 NOTE — Patient Instructions (Signed)
Pepcid '20mg'$  - take one tablet 30 minutes before breakfast

## 2021-08-04 NOTE — Progress Notes (Signed)
Subjective:    Patient ID: Rose Bernard, female    DOB: 27-Jan-1966, 55 y.o.   MRN: [id number]  This visit occurred during the SARS-CoV-2 public health emergency.  Safety protocols were in place, including screening questions prior to the visit, additional usage of staff PPE, and extensive cleaning of exam room while observing appropriate contact time as indicated for disinfecting solutions.   Patient here for work in appt.    Chief Complaint  Patient presents with   Chest Pain   .   Chest Pain  This is a new problem. The current episode started yesterday. The problem occurs intermittently. The quality of the pain is described as pressure and tightness. Pertinent negatives include no abdominal pain, cough, dizziness, headaches, nausea, palpitations, shortness of breath or vomiting. The pain is aggravated by exertion. She has tried antacids and rest for the symptoms.   Walked in to pick up work form and had reported chest pain - since yesterday.  Reported eating rice/cheese/pepper yesterday around 2:00.  Had symptoms around 4:00.  Discussed fullness in her chest.  Episode last 5-10 minutes.  Took TUMS 0 - also baking soda and water.  Some indigestion and burning - intermittently.  Tries to stay active.  No significant chest pain with increased activity or exertion.  Breathing overall relatively stable.  May notice some sob with exertion.  No increased cough or congestion.  No abdominal pain currently.  Bowels stable.  Has had covid recently.    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 Hysterectomy and bladder repair with subsequent appendectomy. L Ovary remain in place     APPENDECTOMY     2001 due to scar tissue   BLADDER BOTOX  2014, 2015, 2016   BREAST BIOPSY Bilateral 1991   BREAST BIOPSY Left 12/12/2019   [place] bx, path pending   BREAST SURGERY Bilateral 2002   benign mass removed   COLONOSCOPY  2014   EXCISION OF BREAST BIOPSY Left 01/10/2020   Procedure: EXCISION OF BREAST BIOPSY w/ Needle localization;  Surgeon: Herbert Pun, MD;  Location: [facility] OR[facility];  Service: General;  Laterality: Left;   FOOT SURGERY Bilateral    [place], PARTIAL Left 01/31/2020   Procedure: MASTECTOMY PARTIAL;  Surgeon: Herbert Pun, MD;  Location: [facility] OR[facility];  Service: General;  Laterality: Left;   reconstruction of bladder   1995    2/2 delivery complications with 3rd child    Family History  Problem Relation Age of Onset   Colon cancer Father        With ostomy in place    Breast cancer Paternal Aunt 58   Breast cancer Maternal Aunt    Breast cancer Maternal Aunt    Social History   Socioeconomic History   Marital status: Married    Spouse name: Not on file   Number of children: 2   Years of education: Not on file   Highest education level: Not on file  Occupational History   Not on file  Tobacco Use   Smoking status: Never   Smokeless tobacco: Never  Vaping Use   Vaping Use:  Never used  Substance and Sexual Activity   Alcohol use: No    Alcohol/week: 0.0 standard drinks   Drug use: No   Sexual activity: Yes    Birth control/protection: Surgical  Other Topics Concern   Not on file  Social History Narrative   Not on file   Social Determinants of Health   Financial Resource Strain: Not on file  Food Insecurity: Not on file  Transportation Needs: Not on file  Physical Activity: Not on file  Stress: Not on file  Social Connections: Not on file     Review of Systems  Constitutional:  Negative for appetite change and unexpected weight change.  HENT:  Negative for congestion and sinus pressure.    Respiratory:  Negative for cough, chest tightness and shortness of breath.   Cardiovascular:  Positive for chest pain. Negative for palpitations and leg swelling.  Gastrointestinal:  Negative for abdominal pain, diarrhea, nausea and vomiting.  Genitourinary:  Negative for difficulty urinating.  Musculoskeletal:  Negative for joint swelling and myalgias.  Skin:  Negative for color change and rash.  Neurological:  Negative for dizziness, light-headedness and headaches.  Psychiatric/Behavioral:  Negative for agitation and dysphoric mood.       Objective:     BP 128/64   Pulse 66   Resp 16   Ht '5\' 7"'$  (1.702 m)   Wt 140 lb (63.5 kg)   LMP 02/07/1994   SpO2 99%   BMI 21.93 kg/m  Wt Readings from Last 3 Encounters:  08/04/21 140 lb (63.5 kg)  07/10/21 140 lb (63.5 kg)  07/07/21 140 lb (63.5 kg)    Physical Exam Vitals reviewed.  Constitutional:      General: She is not in acute distress.    Appearance: Normal appearance.  HENT:     Head: Normocephalic and atraumatic.     Right Ear: External ear normal.     Left Ear: External ear normal.  Eyes:     General: No scleral icterus.       Right eye: No discharge.        Left eye: No discharge.     Conjunctiva/sclera: Conjunctivae normal.  Neck:     Thyroid: No thyromegaly.  Cardiovascular:     Rate and Rhythm: Normal rate and regular rhythm.  Pulmonary:     Effort: No respiratory distress.     Breath sounds: Normal breath sounds. No wheezing.  Abdominal:     General: Bowel sounds are normal.     Palpations: Abdomen is soft.     Tenderness: There is no abdominal tenderness.  Musculoskeletal:        General: No swelling or tenderness.     Cervical back: Neck supple. No tenderness.  Lymphadenopathy:     Cervical: No cervical adenopathy.  Skin:    Findings: No erythema or rash.  Neurological:     Mental Status: She is alert.  Psychiatric:        Mood and Affect: Mood normal.        Behavior: Behavior normal.      Outpatient Encounter Medications as of 08/04/2021  Medication Sig   benzonatate (TESSALON) 100 MG capsule Take 1 capsule (100 mg total) by mouth 3 (three) times daily as needed for cough.   nystatin (MYCOSTATIN) 100000 UNIT/ML suspension 5 cc's swish and spit tid   No facility-administered encounter medications on file as of 08/04/2021.     Lab Results  Component Value Date   WBC 4.8 11/09/2020  HGB 13.3 11/09/2020   HCT 39.8 11/09/2020   PLT 259.0 11/09/2020   GLUCOSE 80 03/29/2021   CHOL 200 03/29/2021   TRIG 85.0 03/29/2021   HDL 71.30 03/29/2021   LDLCALC 112 (H) 03/29/2021   ALT 12 03/29/2021   AST 14 03/29/2021   NA 141 03/29/2021   K 4.2 03/29/2021   CL 104 03/29/2021   CREATININE 0.68 03/29/2021   BUN 14 03/29/2021   CO2 31 03/29/2021   TSH 1.87 11/09/2020   HGBA1C 5.6 10/01/2015       Assessment & Plan:   Problem List Items Addressed This Visit     Chest pain    Chest pain as outlined.  Discussed possible etiologies.  EKG - SR with TWI in III (isolated lead).  No acute ischemic changes.  Discussed possible GI origin.  Treat with pepcid as outlined.  Follow for triggers.  Given this episode and current symptoms, discussed further cardiac evaluation, including possible calcium scoring, etc.   Refer to cardiology for further evaluation and question of need for further w/up. Schedule f/u with me soon. Pending cardiac evaluation and response to pepcid, may need further GI evaluation ( including abdominal ultrasound, etc).        Relevant Orders   Ambulatory referral to Cardiology   DCIS (ductal carcinoma in situ)    S/p excision.  Being followed by oncology.  Completed XRT 03/2018.  Was on arimidex.  Off now.  Continue f/u with oncology.       GERD (gastroesophageal reflux disease)    Not on any medication now.  Discussed starting pepcid '20mg'$  q day.  Take before breakfast.  Follow.       Stress    Increased stress.  Overall appears to be handling things  relatively well.  Follow.         The entirety of the information documented in the History of Present Illness, Review of Systems and Physical Exam were personally obtained by me. Portions of this information were initially documented by the CMA and reviewed by me for thoroughness and accuracy.    Einar Pheasant, MD

## 2021-08-04 NOTE — Telephone Encounter (Signed)
Rose Bernard was seen in office for chest pain when she came to pick up her form.  See note.

## 2021-08-04 NOTE — Telephone Encounter (Signed)
See other note

## 2021-08-04 NOTE — Telephone Encounter (Signed)
FYI-  Called patient to let her know that Dr Nicki Reaper has signed her form that I completed but she does need to complete her part of them form before I can send. While on the phone, patient asked about getting her 4 covid vaccine (there is another note about this.) Patient noted that last night she started having chest pain and pressure that last about 10-15 minutes in the sternum area and radiates to her back. She thinks this is indigestion. Denies sob, palpitations, etc. Noted bilateral lower extremity swelling but says this is persistent. She used tums and alka seltzer to get some relief but has had the same symptoms again this morning. Advised that given her symptoms that she does not need to get the covid vaccine and she needs to go to the ED for evaluation today. Patient declined. She says that she is going to wait this out because this has happened in the past and has been nothing. Advised again that Dr Nicki Reaper is going to want her to be evaluated. Patient stated that she understood and if sx got bad enough she would be evaluated but right now she is not going. She is going to come to office to sign form so I could send and still declined to be evaluated.

## 2021-08-09 ENCOUNTER — Ambulatory Visit (INDEPENDENT_AMBULATORY_CARE_PROVIDER_SITE_OTHER): Payer: BC Managed Care – PPO | Admitting: *Deleted

## 2021-08-09 ENCOUNTER — Other Ambulatory Visit: Payer: Self-pay

## 2021-08-09 DIAGNOSIS — H6693 Otitis media, unspecified, bilateral: Secondary | ICD-10-CM | POA: Diagnosis not present

## 2021-08-09 DIAGNOSIS — N3281 Overactive bladder: Secondary | ICD-10-CM | POA: Diagnosis not present

## 2021-08-09 DIAGNOSIS — Z03818 Encounter for observation for suspected exposure to other biological agents ruled out: Secondary | ICD-10-CM | POA: Diagnosis not present

## 2021-08-09 DIAGNOSIS — J029 Acute pharyngitis, unspecified: Secondary | ICD-10-CM | POA: Diagnosis not present

## 2021-08-09 NOTE — Telephone Encounter (Signed)
Patient calling to check on the status of her short term disability form.She stated her job is requesting this information.

## 2021-08-09 NOTE — Patient Instructions (Signed)
Tracking Your Bladder Symptoms    Patient Name:___________________________________________________   Sample: Day   Daytime Voids  Nighttime Voids Urgency for the Day(0-4) Number of Accidents Beverage Comments  Monday IIII II 2 I Water IIII Coffee  I      Week Starting:____________________________________   Day Daytime  Voids Nighttime  Voids Urgency for  The Day(0-4) Number of Accidents Beverages Comments                                                           This week my symptoms were:  O much better  O better O the same O worse   

## 2021-08-09 NOTE — Progress Notes (Signed)
Session # 3 of 45   Health & Social Factors: no change Caffeine: 0-1 Alcohol: 0 Daytime voids #per day: 6-7 Night-time voids #per night: 1 Urgency: severe Incontinence Episodes #per day: 1-2 Ankle used: left Treatment Setting: 5 Feeling/ Response: sensory Comments: patient tolerated well   Performed By: Gaspar Cola CMA    Follow up  1 week 4 of 45

## 2021-08-15 ENCOUNTER — Encounter: Payer: Self-pay | Admitting: Internal Medicine

## 2021-08-15 NOTE — Assessment & Plan Note (Signed)
Increased stress.  Overall appears to be handling things relatively well.  Follow.   

## 2021-08-15 NOTE — Assessment & Plan Note (Signed)
Not on any medication now.  Discussed starting pepcid '20mg'$  q day.  Take before breakfast.  Follow.

## 2021-08-15 NOTE — Assessment & Plan Note (Addendum)
S/p excision.  Being followed by oncology.  Completed XRT 03/2018.  Was on arimidex.  Off now.  Continue f/u with oncology.

## 2021-08-15 NOTE — Assessment & Plan Note (Addendum)
Chest pain as outlined.  Discussed possible etiologies.  EKG - SR with TWI in III (isolated lead).  No acute ischemic changes.  Discussed possible GI origin.  Treat with pepcid as outlined.  Follow for triggers.  Given this episode and current symptoms, discussed further cardiac evaluation, including possible calcium scoring, etc.   Refer to cardiology for further evaluation and question of need for further w/up. Schedule f/u with me soon. Pending cardiac evaluation and response to pepcid, may need further GI evaluation ( including abdominal ultrasound, etc).

## 2021-08-17 ENCOUNTER — Encounter: Payer: Self-pay | Admitting: Internal Medicine

## 2021-08-17 ENCOUNTER — Ambulatory Visit (INDEPENDENT_AMBULATORY_CARE_PROVIDER_SITE_OTHER): Payer: BC Managed Care – PPO | Admitting: *Deleted

## 2021-08-17 ENCOUNTER — Ambulatory Visit (INDEPENDENT_AMBULATORY_CARE_PROVIDER_SITE_OTHER): Payer: BC Managed Care – PPO | Admitting: Internal Medicine

## 2021-08-17 ENCOUNTER — Other Ambulatory Visit: Payer: Self-pay

## 2021-08-17 DIAGNOSIS — D0512 Intraductal carcinoma in situ of left breast: Secondary | ICD-10-CM

## 2021-08-17 DIAGNOSIS — F439 Reaction to severe stress, unspecified: Secondary | ICD-10-CM | POA: Diagnosis not present

## 2021-08-17 DIAGNOSIS — Z800 Family history of malignant neoplasm of digestive organs: Secondary | ICD-10-CM

## 2021-08-17 DIAGNOSIS — N3281 Overactive bladder: Secondary | ICD-10-CM | POA: Diagnosis not present

## 2021-08-17 DIAGNOSIS — N3020 Other chronic cystitis without hematuria: Secondary | ICD-10-CM

## 2021-08-17 DIAGNOSIS — J3489 Other specified disorders of nose and nasal sinuses: Secondary | ICD-10-CM

## 2021-08-17 DIAGNOSIS — K219 Gastro-esophageal reflux disease without esophagitis: Secondary | ICD-10-CM

## 2021-08-17 DIAGNOSIS — D649 Anemia, unspecified: Secondary | ICD-10-CM

## 2021-08-17 DIAGNOSIS — R079 Chest pain, unspecified: Secondary | ICD-10-CM

## 2021-08-17 MED ORDER — DOXYCYCLINE HYCLATE 100 MG PO TABS: 100.0000 mg | ORAL_TABLET | Freq: Two times a day (BID) | ORAL | 0 refills | Status: DC

## 2021-08-17 NOTE — Progress Notes (Deleted)
PTNS   Session # 2 of 45   Health & Social Factors: no change Caffeine: 0-1 Alcohol: 0 Daytime voids #per day: 6-7 Night-time voids #per night: 1 Urgency: severe Incontinence Episodes #per day: 1 Ankle used: left Treatment Setting: 2 Feeling/ Response: sensory Comments: patient tolerated well   Performed By: Elberta Leatherwood, CMA   Follow Up: 1 week 3 of 45

## 2021-08-17 NOTE — Progress Notes (Signed)
Session # 4 of 45   Health & Social Factors: no change Caffeine: 0-1 Alcohol: 0 Daytime voids #per day: 6-7 Night-time voids #per night: 1 Urgency: severe Incontinence Episodes #per day: 1-2 Ankle used: left Treatment Setting: 5 Feeling/ Response: sensory Comments: patient tolerated well   Performed By: Gaspar Cola CMA    Follow up  1 week 5 of 45

## 2021-08-17 NOTE — Progress Notes (Signed)
Patient ID: Rose Bernard, female   DOB: [birth year], 55 y.o.   MRN: [id number]   Subjective:    Patient ID: Rose Bernard, female    DOB: Jul 21, 1966, 55 y.o.   MRN: [id number]  This visit occurred during the SARS-CoV-2 public health emergency.  Safety protocols were in place, including screening questions prior to the visit, additional usage of staff PPE, and extensive cleaning of exam room while observing appropriate contact time as indicated for disinfecting solutions.   Patient here for scheduled follow up.  Chief Complaint  Patient presents with   Follow-up    Pt wants ear checked again by provider. Pt states she was feeling better and then the ear pain came back and she is having pressure on the rt side of her face.   Gastroesophageal Reflux   Stress   .   HPI Was recently evaluated here as a work in appt for chest pain.  Was placed on pepcid.  EKG obtained.  Referred to cardiology.  She reports chest pain has resolved.  Taking pepcid.  Declines cardiollogy evaluation now. Does not feel she needs.  Stays active.  No chest pain with activity.  No sob.  No acid reflux on pepcid.  No abdominal pain reported.  Followed by urology.  She does report seen 08/09/21 and diagnosed with ear infection.  Zpak prescribed.  Felt better.  Has noticed some return of symptoms - sinus pressure and right ear fullness.  No fever.  No cough or congestion.  No sob.  Feels like was partially treated and now symptoms returiing.     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 Hysterectomy and bladder repair with subsequent appendectomy. L Ovary remain in place    APPENDECTOMY     2001 due to  scar tissue   BLADDER BOTOX  2014, 2015, 2016   BREAST BIOPSY Bilateral 1991   BREAST BIOPSY Left 12/12/2019   [place] bx, path pending   BREAST SURGERY Bilateral 2002   benign mass removed   COLONOSCOPY  2014   EXCISION OF BREAST BIOPSY Left 01/10/2020   Procedure: EXCISION OF BREAST BIOPSY w/ Needle localization;  Surgeon: Herbert Pun, MD;  Location: [facility];  Service: General;  Laterality: Left;   FOOT SURGERY Bilateral    [place], PARTIAL Left 01/31/2020   Procedure: MASTECTOMY PARTIAL;  Surgeon: Herbert Pun, MD;  Location: [facility];  Service: General;  Laterality: Left;   reconstruction of bladder   1995    2/2 delivery complications with 3rd child    Family History  Problem Relation Age of Onset   Colon cancer Father        With ostomy in place    Breast cancer Paternal Aunt 44   Breast cancer Maternal Aunt    Breast cancer Maternal Aunt    Social History   Socioeconomic History   Marital status: Married    Spouse name: Not on file   Number of children: 2   Years of education: Not on file   Highest education level: Not on file  Occupational History   Not on file  Tobacco Use   Smoking status: Never   Smokeless tobacco: Never  Vaping Use   Vaping Use: Never used  Substance and Sexual Activity   Alcohol use: No    Alcohol/week: 0.0 standard drinks   Drug use: No   Sexual activity: Yes    Birth control/protection: Surgical  Other Topics Concern   Not on file  Social History Narrative   Not on file   Social Determinants of Health   Financial Resource Strain: Not on file  Food Insecurity: Not on file  Transportation Needs: Not on file  Physical Activity: Not on file  Stress: Not on file  Social Connections: Not on file     Review of Systems  Constitutional:  Negative for appetite change and unexpected weight change.  HENT:  Positive for congestion and sinus pressure.   Respiratory:  Negative for  cough, chest tightness and shortness of breath.   Cardiovascular:  Negative for chest pain, palpitations and leg swelling.  Gastrointestinal:  Negative for abdominal pain, diarrhea, nausea and vomiting.  Genitourinary:  Negative for difficulty urinating and dysuria.  Musculoskeletal:  Negative for joint swelling and myalgias.  Skin:  Negative for color change and rash.  Neurological:  Negative for dizziness, light-headedness and headaches.  Psychiatric/Behavioral:  Negative for agitation and dysphoric mood.       Objective:     BP 120/68   Pulse 65   Temp 97.7 F (36.5 C)   Ht 5' 7.01" (1.702 m)   Wt 143 lb 9.6 oz (65.1 kg)   LMP 02/07/1994   SpO2 96%   BMI 22.49 kg/m  Wt Readings from Last 3 Encounters:  08/17/21 143 lb 9.6 oz (65.1 kg)  08/04/21 140 lb (63.5 kg)  07/10/21 140 lb (63.5 kg)    Physical Exam Vitals reviewed.  Constitutional:      General: She is not in acute distress.    Appearance: Normal appearance.  HENT:     Head: Normocephalic and atraumatic.     Comments: Minimal tenderness - over the sinuses.      Right Ear: Tympanic membrane, ear canal and external ear normal.     Left Ear: Tympanic membrane, ear canal and external ear normal.  Eyes:     General: No scleral icterus.       Right eye: No discharge.        Left eye: No discharge.     Conjunctiva/sclera: Conjunctivae normal.  Neck:     Thyroid: No thyromegaly.  Cardiovascular:     Rate and Rhythm: Normal rate and regular rhythm.  Pulmonary:     Effort: No respiratory distress.     Breath sounds: Normal breath sounds. No wheezing.  Abdominal:     General: Bowel sounds are normal.     Palpations: Abdomen is soft.     Tenderness: There is no abdominal tenderness.  Musculoskeletal:        General: No swelling or tenderness.     Cervical back: Neck supple. No tenderness.  Lymphadenopathy:     Cervical: No cervical adenopathy.  Skin:    Findings: No erythema or rash.  Neurological:      Mental Status: She is alert.  Psychiatric:        Mood and Affect: Mood normal.        Behavior: Behavior normal.     Outpatient Encounter Medications as of 08/17/2021  Medication Sig   doxycycline (VIBRA-TABS) 100 MG tablet Take 1 tablet (100 mg total) by mouth  2 (two) times daily.   Famotidine (PEPCID PO) Take by mouth.   [DISCONTINUED] benzonatate (TESSALON) 100 MG capsule Take 1 capsule (100 mg total) by mouth 3 (three) times daily as needed for cough. (Patient not taking: Reported on 08/17/2021)   [DISCONTINUED] nystatin (MYCOSTATIN) 100000 UNIT/ML suspension 5 cc's swish and spit tid (Patient not taking: Reported on 08/17/2021)   No facility-administered encounter medications on file as of 08/17/2021.     Lab Results  Component Value Date   WBC 4.8 11/09/2020   HGB 13.3 11/09/2020   HCT 39.8 11/09/2020   PLT 259.0 11/09/2020   GLUCOSE 80 03/29/2021   CHOL 200 03/29/2021   TRIG 85.0 03/29/2021   HDL 71.30 03/29/2021   LDLCALC 112 (H) 03/29/2021   ALT 12 03/29/2021   AST 14 03/29/2021   NA 141 03/29/2021   K 4.2 03/29/2021   CL 104 03/29/2021   CREATININE 0.68 03/29/2021   BUN 14 03/29/2021   CO2 31 03/29/2021   TSH 1.87 11/09/2020   HGBA1C 5.6 10/01/2015    No results found.     Assessment & Plan:   Problem List Items Addressed This Visit     Anemia    Follow cbc.       Chest pain    Evaluated last visit as outlined.  Referred to cardiology. Placed on pepcid.  Symptoms resolved.  Desires no further testing or evaluation.  Wants to cancel cardiology referral.       Chronic cystitis    Followed by urology.       DCIS (ductal carcinoma in situ)    S/p excision.  Being followed by oncology.  Completed XRT 03/2018.  Was on arimidex.  Off for now.  Plans to f/u with oncology.        Relevant Medications   doxycycline (VIBRA-TABS) 100 MG tablet   Family history of colon cancer    Colonoscopy 07/2018 - diverticulum and internal hemorrhoids.       GERD  (gastroesophageal reflux disease)    On pepcid.  Symptoms resolved.  Elects to follow.       Relevant Medications   Famotidine (PEPCID PO)   Sinus pressure    Symptoms appear to be c/w sinus infection.  Recently evaluated and diagnosed with ear infection.  Treated with zpak.  Symptoms improved, but did not resolve.  Treat with doxycycline as directed.  Saline nasal spray/steroid nasal spray as directed.  Follow.  Probiotic daily while on abx and for two weeks after.  Notify if persistent symptoms.       Stress    Increased stress.  Overall appears to be handling things relatively well.  Follow.         Einar Pheasant, MD

## 2021-08-17 NOTE — Patient Instructions (Signed)
Tracking Your Bladder Symptoms    Patient Name:___________________________________________________   Sample: Day   Daytime Voids  Nighttime Voids Urgency for the Day(0-4) Number of Accidents Beverage Comments  Monday IIII II 2 I Water IIII Coffee  I      Week Starting:____________________________________   Day Daytime  Voids Nighttime  Voids Urgency for  The Day(0-4) Number of Accidents Beverages Comments                                                           This week my symptoms were:  O much better  O better O the same O worse   

## 2021-08-22 ENCOUNTER — Encounter: Payer: Self-pay | Admitting: Internal Medicine

## 2021-08-22 ENCOUNTER — Telehealth: Payer: Self-pay | Admitting: Internal Medicine

## 2021-08-22 DIAGNOSIS — J3489 Other specified disorders of nose and nasal sinuses: Secondary | ICD-10-CM | POA: Insufficient documentation

## 2021-08-22 NOTE — Assessment & Plan Note (Signed)
Follow cbc.  

## 2021-08-22 NOTE — Assessment & Plan Note (Signed)
Colonoscopy 07/2018 - diverticulum and internal hemorrhoids.  

## 2021-08-22 NOTE — Telephone Encounter (Signed)
Saw Rose Bernard in f/u.  Chest pain resolved.  She wanted to cancel cardiology appt.  I had placed cardiology referral previously, but she declines now.  Thank you.

## 2021-08-22 NOTE — Assessment & Plan Note (Signed)
Followed by urology.   

## 2021-08-22 NOTE — Assessment & Plan Note (Signed)
Increased stress.  Overall appears to be handling things relatively well.  Follow.   

## 2021-08-22 NOTE — Assessment & Plan Note (Signed)
Symptoms appear to be c/w sinus infection.  Recently evaluated and diagnosed with ear infection.  Treated with zpak.  Symptoms improved, but did not resolve.  Treat with doxycycline as directed.  Saline nasal spray/steroid nasal spray as directed.  Follow.  Probiotic daily while on abx and for two weeks after.  Notify if persistent symptoms.

## 2021-08-22 NOTE — Assessment & Plan Note (Signed)
S/p excision.  Being followed by oncology.  Completed XRT 03/2018.  Was on arimidex.  Off for now.  Plans to f/u with oncology.

## 2021-08-22 NOTE — Assessment & Plan Note (Signed)
Evaluated last visit as outlined.  Referred to cardiology. Placed on pepcid.  Symptoms resolved.  Desires no further testing or evaluation.  Wants to cancel cardiology referral.

## 2021-08-22 NOTE — Assessment & Plan Note (Signed)
On pepcid.  Symptoms resolved.  Elects to follow.

## 2021-08-23 ENCOUNTER — Ambulatory Visit (INDEPENDENT_AMBULATORY_CARE_PROVIDER_SITE_OTHER): Payer: BC Managed Care – PPO | Admitting: Family Medicine

## 2021-08-23 ENCOUNTER — Other Ambulatory Visit: Payer: Self-pay

## 2021-08-23 DIAGNOSIS — N3281 Overactive bladder: Secondary | ICD-10-CM

## 2021-08-23 NOTE — Progress Notes (Signed)
PTNS  Session # 5 of 45  Health & Social Factors: no change Caffeine: 2-3  Alcohol: 0 Daytime voids #per day: 7 Night-time voids #per night: 0-1 Urgency: mild Incontinence Episodes #per day: 2 Ankle used: left Treatment Setting: 3 Feeling/ Response: sensory Comments: patient tolerated well  Performed By: Elberta Leatherwood, CMA  Follow Up: 1 week #6 of 45

## 2021-08-23 NOTE — Patient Instructions (Signed)
Tracking Your Bladder Symptoms    Patient Name:___________________________________________________   Sample: Day   Daytime Voids  Nighttime Voids Urgency for the Day(0-4) Number of Accidents Beverage Comments  Monday IIII II 2 I Water IIII Coffee  I      Week Starting:____________________________________   Day Daytime  Voids Nighttime  Voids Urgency for  The Day(0-4) Number of Accidents Beverages Comments                                                           This week my symptoms were:  O much better  O better O the same O worse   

## 2021-08-29 ENCOUNTER — Other Ambulatory Visit: Payer: Self-pay

## 2021-08-29 ENCOUNTER — Encounter: Payer: Self-pay | Admitting: Emergency Medicine

## 2021-08-29 ENCOUNTER — Ambulatory Visit
Admission: EM | Admit: 2021-08-29 | Discharge: 2021-08-29 | Disposition: A | Discharge status: Home / Self Care | Payer: BC Managed Care – PPO | Attending: Physician Assistant | Admitting: Physician Assistant

## 2021-08-29 DIAGNOSIS — R0981 Nasal congestion: Secondary | ICD-10-CM | POA: Diagnosis not present

## 2021-08-29 DIAGNOSIS — H6501 Acute serous otitis media, right ear: Secondary | ICD-10-CM | POA: Diagnosis not present

## 2021-08-29 DIAGNOSIS — H9201 Otalgia, right ear: Secondary | ICD-10-CM

## 2021-08-29 MED ORDER — FLUTICASONE PROPIONATE 50 MCG/ACT NA SUSP: 2.0000 | Freq: Every day | NASAL | 0 refills | Status: DC

## 2021-08-29 MED ORDER — PREDNISONE 20 MG PO TABS: 40.0000 mg | ORAL_TABLET | Freq: Every day | ORAL | 0 refills | Status: AC

## 2021-08-29 MED ORDER — PSEUDOEPHEDRINE HCL ER 120 MG PO TB12: 120.0000 mg | ORAL_TABLET | Freq: Two times a day (BID) | ORAL | 0 refills | Status: AC

## 2021-08-29 NOTE — ED Provider Notes (Signed)
[facility]    CSN: [id number] Arrival date & time: 08/29/21  0808      History   Chief Complaint Chief Complaint  Patient presents with   Otalgia    right   Sinus Problem    HPI Rose Bernard is a 55 y.o. female presenting for 3-week history of right-sided ear pain.  Patient also admits to sinus congestion and sinus pressure which is worse on the right side.  Patient was initially seen by [facility] on 08/09/2021 and prescribed Z-Pak for ear infection.  Her symptoms apparently improved with the medication and then she followed up with her PCP on 08/17/2021 and was prescribed 7-day course of doxycycline which she finished 4 days ago.  Patient says she felt better while she was taking that but her symptoms seem to get worse within a couple days after completing the medication.  She says she was also taking decongestants and using nasal sprays when taking the antibiotics and stopped taking that at the time she finished the antibiotics.  Patient does report history of allergies.  She denies any worsening of her symptoms.  She denies any fever, fatigue, cough, chest pain, breathing occultly, vomiting or diarrhea.  No known COVID exposure or concern for COVID-19.  Personal history of COVID-19 5 weeks ago.  She has no other complaints.  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     Patient Active Problem List   Diagnosis Date Noted   Sinus pressure 08/22/2021   Pain in Achilles tendon 03/11/2021   Chronic venous insufficiency 11/30/2020   Lymphedema 11/10/2020   Lab test positive for detection of COVID-19 virus 07/31/2020   Family history of thrombosis 04/28/2020   Family history of cancer 04/28/2020   Thrombophlebitis of superficial veins of left lower extremity 04/28/2020    URI (upper respiratory infection) 04/27/2020   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   Internal nasal lesion 11/24/2019   Foot pain, right 11/24/2019   GERD (gastroesophageal reflux disease) 11/24/2019   Abdominal pain 09/15/2019   Chest pain 09/15/2019   Stress 04/23/2018   Anemia 03/31/2018   Shingles 01/18/2018   Gross hematuria 10/24/2017   Nephrolithiasis [id number]   Renal colic [id number]   Left carotid bruit 08/02/2017   Hot flashes 02/09/2016   Loss of weight 10/02/2015   Myofascial pain 09/16/2015   Chronic female pelvic pain 09/16/2015   Abdominal pain, left lower quadrant 09/07/2015   Abnormal mammogram 04/12/2015   Health care maintenance 04/12/2015   Sacral pain 12/29/2013   Urinary frequency 05/15/2013   Urge incontinence of urine 05/15/2013   UTI (urinary tract infection) 02/27/2013   Family history of colon cancer 02/20/2013   Headache 02/11/2013   Chronic cystitis 02/11/2013   History of ovarian cyst 02/11/2013   Reflux, vesicoureteral 10/15/2012   Symptoms involving urinary system 10/15/2012   Mixed incontinence 10/15/2012   Incomplete bladder emptying 10/15/2012   Disorder of bladder function 10/15/2012    Past Surgical History:  Procedure Laterality Date   ABDOMINAL HYSTERECTOMY  1995   2/2 delivery complications with 3rd child. Hysterectomy and bladder repair with subsequent appendectomy. L Ovary remain in place    APPENDECTOMY     2001 due to scar tissue   BLADDER BOTOX  2014, 2015, 2016   BREAST BIOPSY Bilateral 1991   BREAST BIOPSY Left 12/12/2019   [place] bx, path pending   BREAST SURGERY Bilateral 2002   benign mass removed   COLONOSCOPY  2014   EXCISION OF BREAST BIOPSY Left 01/10/2020   Procedure: EXCISION OF BREAST BIOPSY w/ Needle localization;  Surgeon: Herbert Pun, MD;  Location: [facility];  Service: General;  Laterality: Left;   FOOT SURGERY Bilateral    LAPAROSCOPIC HYSTERECTOMY  1995   MASTECTOMY, PARTIAL Left 01/31/2020    Procedure: MASTECTOMY PARTIAL;  Surgeon: Herbert Pun, MD;  Location: [facility];  Service: General;  Laterality: Left;   reconstruction of bladder   1995    2/2 delivery complications with 3rd child     OB History     Gravida  3   Para  2   Term      Preterm      AB      Living  2      SAB      IAB      Ectopic      Multiple      Live Births           Obstetric Comments  One still born 1st Menstrual Cycle:  48  1st Pregnancy:  24           Home Medications    Prior to Admission medications   Medication Sig Start Date End Date Taking? Authorizing Provider  Famotidine (PEPCID PO) Take by mouth.   Yes [provider]  fluticasone (FLONASE) 50 MCG/ACT nasal spray Place 2 sprays into both nostrils daily. 08/29/21  Yes Danton Clap, PA-C  predniSONE (DELTASONE) 20 MG tablet Take 2 tablets (40 mg total) by mouth daily for 5 days. 08/29/21 09/03/21 Yes Danton Clap, PA-C  pseudoephedrine (SUDAFED 12 HOUR) 120 MG 12 hr tablet Take 1 tablet (120 mg total) by mouth 2 (two) times daily for 7 days. 08/29/21 09/05/21 Yes Danton Clap, PA-C    Family History Family History  Problem Relation Age of Onset   Colon cancer Father        With ostomy in place    Breast cancer Paternal Aunt 34   Breast cancer Maternal Aunt    Breast cancer Maternal Aunt     Social History Social History   Tobacco Use   Smoking status: Never   Smokeless tobacco: Never  Vaping Use   Vaping Use: Never used  Substance Use Topics   Alcohol use: No    Alcohol/week: 0.0 standard drinks   Drug use: No     Allergies   Penicillin v potassium   Review of Systems Review of Systems  Constitutional:  Positive for fatigue. Negative for chills, diaphoresis and fever.  HENT:  Positive for congestion, ear pain, rhinorrhea, sinus pressure and sinus pain. Negative for sore throat.   Respiratory:  Negative for cough and shortness of breath.   Gastrointestinal:   Negative for abdominal pain, nausea and vomiting.  Musculoskeletal:  Negative for arthralgias and myalgias.  Skin:  Negative for rash.  Neurological:  Negative for weakness and headaches.  Hematological:  Negative for adenopathy.    Physical Exam Triage Vital Signs ED Triage Vitals  Enc Vitals Group     BP 08/29/21 0824 131/62     Pulse Rate 08/29/21 0824 64     Resp 08/29/21 0824 14     Temp 08/29/21 0824 97.9 F (36.6 C)  Temp Source 08/29/21 0824 Oral     SpO2 08/29/21 0824 100 %     Weight 08/29/21 0820 140 lb (63.5 kg)     Height 08/29/21 0820 '5\' 7"'$  (1.702 m)     Head Circumference --      Peak Flow --      Pain Score 08/29/21 0820 8     Pain Loc --      Pain Edu? --      Excl. in [place]? --    No data found.  Updated Vital Signs BP 131/62 (BP Location: Left Arm)   Pulse 64   Temp 97.9 F (36.6 C) (Oral)   Resp 14   Ht '5\' 7"'$  (1.702 m)   Wt 140 lb (63.5 kg)   LMP 02/07/1994   SpO2 100%   BMI 21.93 kg/m     Physical Exam Vitals and nursing note reviewed.  Constitutional:      General: She is not in acute distress.    Appearance: Normal appearance. She is not ill-appearing or toxic-appearing.  HENT:     Head: Normocephalic and atraumatic.     Right Ear: Hearing, ear canal and external ear normal. A middle ear effusion is present. Tympanic membrane is scarred. Tympanic membrane is not erythematous, retracted or bulging.     Left Ear: Hearing, ear canal and external ear normal. A middle ear effusion is present. Tympanic membrane is scarred. Tympanic membrane is not erythematous, retracted or bulging.     Nose: Congestion present.     Mouth/Throat:     Mouth: Mucous membranes are moist.     Pharynx: Oropharynx is clear.  Eyes:     General: No scleral icterus.       Right eye: No discharge.        Left eye: No discharge.     Conjunctiva/sclera: Conjunctivae normal.  Cardiovascular:     Rate and Rhythm: Normal rate and regular rhythm.     Heart sounds: Normal  heart sounds.  Pulmonary:     Effort: Pulmonary effort is normal. No respiratory distress.     Breath sounds: Normal breath sounds.  Musculoskeletal:     Cervical back: Neck supple.  Skin:    General: Skin is dry.  Neurological:     General: No focal deficit present.     Mental Status: She is alert. Mental status is at baseline.     Motor: No weakness.     Gait: Gait normal.  Psychiatric:        Mood and Affect: Mood normal.        Behavior: Behavior normal.        Thought Content: Thought content normal.     UC Treatments / Results  Labs (all labs ordered are listed, but only abnormal results are displayed) Labs Reviewed - No data to display  EKG   Radiology No results found.  Procedures Procedures (including critical care time)  Medications Ordered in UC Medications - No data to display  Initial Impression / Assessment and Plan / UC Course  I have reviewed the triage vital signs and the nursing notes.  Pertinent labs & imaging results that were available during my care of the patient were reviewed by me and considered in my medical decision making (see chart for details).  55 year old female presenting for right-sided ear pain and sinus congestion for the past 3 weeks.  Patient has already had azithromycin and doxycycline during this time period.  She has also used  nasal decongestants and oral decongestants which she has stopped taking after she stopped taking the antibiotics and symptoms worsened over the past 3 to 4 days.  Today her exam is consistent with effusions of bilateral TMs without erythema or bulging.  No evidence of infection.  Reviewed this with patient and advised her that it can take weeks for the fluid to completely resolve but it is imperative that she resume oral and nasal decongestants.  I have also sent in prednisone for her.  Advised following up with PCP when she finishes the medication or sooner if she is not feeling better for recheck of her ears.   Prescribing a third round of antibiotics would likely be unhelpful at this time.  Final Clinical Impressions(s) / UC Diagnoses   Final diagnoses:  Right acute serous otitis media, recurrence not specified  Right ear pain  Sinus congestion     Discharge Instructions      -Your ear does not look infected.  There is fluid behind the ear.  There could have been an infection before but 2 different antibiotics cleared the infection.  You are still left with some fluid.  I have sent in a oral decongestant, nasal decongestant and prednisone.  This should all help to decrease the inflammation in your sinuses.  Also consider use of nasal saline rinses and humidifier.  Please follow-up with PCP in the next 7 to 10 days to have the ear rechecked or sooner if you are not feeling better.     ED Prescriptions     Medication Sig Dispense Auth. Provider   predniSONE (DELTASONE) 20 MG tablet Take 2 tablets (40 mg total) by mouth daily for 5 days. 10 tablet Danton Clap, PA-C   pseudoephedrine (SUDAFED 12 HOUR) 120 MG 12 hr tablet Take 1 tablet (120 mg total) by mouth 2 (two) times daily for 7 days. 14 tablet Laurene Footman B, PA-C   fluticasone (FLONASE) 50 MCG/ACT nasal spray Place 2 sprays into both nostrils daily. 1 g Danton Clap, PA-C      PDMP not reviewed this encounter.   Danton Clap, PA-C 08/29/21 [phone]

## 2021-08-29 NOTE — Discharge Instructions (Addendum)
-  Your ear does not look infected.  There is fluid behind the ear.  There could have been an infection before but 2 different antibiotics cleared the infection.  You are still left with some fluid.  I have sent in a oral decongestant, nasal decongestant and prednisone.  This should all help to decrease the inflammation in your sinuses.  Also consider use of nasal saline rinses and humidifier.  Please follow-up with PCP in the next 7 to 10 days to have the ear rechecked or sooner if you are not feeling better.

## 2021-08-29 NOTE — ED Triage Notes (Addendum)
Patient c/o right ear pain and sinus pressure in her facial cheeks on Friday.  Patient denies fevers.  Patient finished Doxycycline on Monday.  Patient denies sore throat but states that she does have some bumps on the roof of her mouth.

## 2021-08-30 ENCOUNTER — Ambulatory Visit (INDEPENDENT_AMBULATORY_CARE_PROVIDER_SITE_OTHER): Payer: BC Managed Care – PPO

## 2021-08-30 ENCOUNTER — Encounter: Payer: Self-pay | Admitting: Internal Medicine

## 2021-08-30 DIAGNOSIS — N3281 Overactive bladder: Secondary | ICD-10-CM | POA: Diagnosis not present

## 2021-08-30 NOTE — Progress Notes (Signed)
PTNS  Session # 6  Health & Social Factors: No change Caffeine: 0 Alcohol: 0 Daytime voids #per day: 6-7 Night-time voids #per night: 0 Urgency: Strong Incontinence Episodes #per day: 1 Ankle used: left Treatment Setting: 4 Feeling/ Response: Sensory Comments: Patient tolerated well, no complications noted.   Performed By: Bradly Bienenstock, CMA    Follow Up: RTC for next PTNS.

## 2021-08-30 NOTE — Patient Instructions (Signed)
Tracking Your Bladder Symptoms    Patient Name:___________________________________________________   Sample: Day   Daytime Voids  Nighttime Voids Urgency for the Day(0-4) Number of Accidents Beverage Comments  Monday IIII II 2 I Water IIII Coffee  I      Week Starting:____________________________________   Day Daytime  Voids Nighttime  Voids Urgency for  The Day(0-4) Number of Accidents Beverages Comments                                                           This week my symptoms were:  O much better  O better O the same O worse   

## 2021-09-01 NOTE — Telephone Encounter (Signed)
Patient is returning your call from earlier. 

## 2021-09-01 NOTE — Telephone Encounter (Signed)
LMTCB

## 2021-09-03 NOTE — Telephone Encounter (Signed)
Patient said that she has been having persistent issues with her right ear. Was having these issues at last appt. She went to the walk in 9/18 and was treated again with abx and prednisone but stopped the medication because it caused her to have nausea and diarrhea. Still having pressure in her ear. She has called ENT and scheduled an appt for 10/14. Is there another ENT office we can try to get her in with her do you want me to call [facility] and see if there are any earlier appts?

## 2021-09-03 NOTE — Telephone Encounter (Signed)
Patient returning [name]'s phone call.

## 2021-09-03 NOTE — Telephone Encounter (Signed)
This is the only local ent office.  There is an ent in [place], but I am not sure if they can get her in any earlier.  Can all ent and see if can be seen for earlier appt.  If acute issues, needs to be reevaluated.

## 2021-09-06 ENCOUNTER — Ambulatory Visit (INDEPENDENT_AMBULATORY_CARE_PROVIDER_SITE_OTHER): Payer: BC Managed Care – PPO | Admitting: Family Medicine

## 2021-09-06 ENCOUNTER — Other Ambulatory Visit: Payer: Self-pay

## 2021-09-06 DIAGNOSIS — N3281 Overactive bladder: Secondary | ICD-10-CM

## 2021-09-06 NOTE — Patient Instructions (Signed)
Tracking Your Bladder Symptoms    Patient Name:___________________________________________________   Sample: Day   Daytime Voids  Nighttime Voids Urgency for the Day(0-4) Number of Accidents Beverage Comments  Monday IIII II 2 I Water IIII Coffee  I      Week Starting:____________________________________   Day Daytime  Voids Nighttime  Voids Urgency for  The Day(0-4) Number of Accidents Beverages Comments                                                           This week my symptoms were:  O much better  O better O the same O worse   

## 2021-09-06 NOTE — Progress Notes (Signed)
PTNS  Session # 7  Health & Social Factors: no change Caffeine: 1-2 Alcohol: 0 Daytime voids #per day: 7 Night-time voids #per night: 1 Urgency: strong Incontinence Episodes #per day: 1 Ankle used: left Treatment Setting: 7 Feeling/ Response: sensory Comments: patient tolerated well  Performed By: Jacinto Halim  Follow Up: 1 week #8

## 2021-09-13 ENCOUNTER — Ambulatory Visit: Payer: BC Managed Care – PPO | Admitting: Internal Medicine

## 2021-09-13 ENCOUNTER — Ambulatory Visit (INDEPENDENT_AMBULATORY_CARE_PROVIDER_SITE_OTHER): Payer: BC Managed Care – PPO

## 2021-09-13 ENCOUNTER — Other Ambulatory Visit: Payer: Self-pay

## 2021-09-13 DIAGNOSIS — N3281 Overactive bladder: Secondary | ICD-10-CM | POA: Diagnosis not present

## 2021-09-13 NOTE — Patient Instructions (Signed)
Tracking Your Bladder Symptoms    Patient Name:___________________________________________________   Sample: Day   Daytime Voids  Nighttime Voids Urgency for the Day(0-4) Number of Accidents Beverage Comments  Monday IIII II 2 I Water IIII Coffee  I      Week Starting:____________________________________   Day Daytime  Voids Nighttime  Voids Urgency for  The Day(0-4) Number of Accidents Beverages Comments                                                           This week my symptoms were:  O much better  O better O the same O worse   

## 2021-09-13 NOTE — Progress Notes (Signed)
PTNS  Session # 8 of 45  Health & Social Factors: no change Caffeine: 1-2 Alcohol: 0 Daytime voids #per day: 6-7 Night-time voids #per night: 0-1 Urgency: 2-3 Incontinence Episodes #per day: 0-2 Ankle used: left Treatment Setting: 6 Feeling/ Response: sensory Comments: patient tolerated well  Performed By: Fonnie Jarvis, CMA  Follow Up: 1 week

## 2021-09-15 ENCOUNTER — Encounter: Payer: Self-pay | Admitting: Podiatry

## 2021-09-15 ENCOUNTER — Other Ambulatory Visit: Payer: Self-pay

## 2021-09-15 ENCOUNTER — Ambulatory Visit (INDEPENDENT_AMBULATORY_CARE_PROVIDER_SITE_OTHER): Payer: BC Managed Care – PPO | Admitting: Podiatry

## 2021-09-15 DIAGNOSIS — M722 Plantar fascial fibromatosis: Secondary | ICD-10-CM | POA: Diagnosis not present

## 2021-09-15 MED ORDER — NABUMETONE 500 MG PO TABS: 500.0000 mg | ORAL_TABLET | Freq: Two times a day (BID) | ORAL | 0 refills | Status: DC

## 2021-09-15 NOTE — Patient Instructions (Signed)

## 2021-09-16 ENCOUNTER — Encounter: Payer: Self-pay | Admitting: Internal Medicine

## 2021-09-16 DIAGNOSIS — Z1231 Encounter for screening mammogram for malignant neoplasm of breast: Secondary | ICD-10-CM

## 2021-09-16 DIAGNOSIS — Z853 Personal history of malignant neoplasm of breast: Secondary | ICD-10-CM

## 2021-09-16 NOTE — Telephone Encounter (Signed)
Patient is calling in to check on the status of the message below. 

## 2021-09-17 NOTE — Telephone Encounter (Signed)
Ok to refer her back to the cancer center at [place]?

## 2021-09-17 NOTE — Telephone Encounter (Signed)
Orders placed for diagnostic mammogram to be done at [facility].  She may need to get [facility] mammograms.  Also, I have place order for referral to oncology - f/u with Dr Tasia Catchings.

## 2021-09-20 ENCOUNTER — Ambulatory Visit (INDEPENDENT_AMBULATORY_CARE_PROVIDER_SITE_OTHER): Payer: BC Managed Care – PPO | Admitting: *Deleted

## 2021-09-20 ENCOUNTER — Other Ambulatory Visit: Payer: Self-pay

## 2021-09-20 DIAGNOSIS — N3281 Overactive bladder: Secondary | ICD-10-CM | POA: Diagnosis not present

## 2021-09-20 NOTE — Progress Notes (Signed)
PTNS   Session # 9  of 45   Health & Social Factors: no change Caffeine: 1-2 Alcohol: 0 Daytime voids #per day: 6-7 Night-time voids #per night: 0-1 Urgency: 2-3 Incontinence Episodes #per day: 0-2 Ankle used: left Treatment Setting: 1 Feeling/ Response: sensory Comments: patient tolerated well   Performed By: Lendon Collar CMA   Follow Up: 1 week

## 2021-09-20 NOTE — Progress Notes (Signed)
  Subjective:  Patient ID: JOSETTA WIGAL, female    DOB: Feb 18, 1966,  MRN: [id number]  Chief Complaint  Patient presents with   Foot Pain    Patient presents for right heel pain and top of foot to toes painful x 1 month    55 y.o. female returns for follow-up with the above complaint. History confirmed with patient.  Achilles and neuromas are stable.  She now has mostly right heel pain on the bottom of the heel  Objective:  Physical Exam: warm, good capillary refill, no trophic changes or ulcerative lesions, normal DP and PT pulses and normal sensory exam.  Right Foot: Sharp pain on palpation of plantar heel Radiographs: X-ray of the right foot: no fracture, dislocation, swelling or degenerative changes noted no evidence of stress fracture  Assessment:   1. Plantar fasciitis of right foot      Plan:  Patient was evaluated and treated and all questions answered.  Discussed the etiology and treatment options for plantar fasciitis including stretching, formal physical therapy, supportive shoegears such as a running shoe or sneaker, pre fabricated orthoses, injection therapy, and oral medications. We also discussed the role of surgical treatment of this for patients who do not improve after exhausting non-surgical treatment options.   -XR reviewed with patient -Educated patient on stretching and icing of the affected limb -Plantar fascial brace dispensed -Injection delivered to the plantar fascia of the right foot. -Rx for Relafen. Educated on use, risks and benefits of the medication -She will bring her CMO's to her next visit for me to evaluate and adjust possibly.   Return in about 1 month (around 10/16/2021) for recheck plantar fasciitis.

## 2021-09-20 NOTE — Patient Instructions (Signed)
Tracking Your Bladder Symptoms    Patient Name:___________________________________________________   Sample: Day   Daytime Voids  Nighttime Voids Urgency for the Day(0-4) Number of Accidents Beverage Comments  Monday IIII II 2 I Water IIII Coffee  I      Week Starting:____________________________________   Day Daytime  Voids Nighttime  Voids Urgency for  The Day(0-4) Number of Accidents Beverages Comments                                                           This week my symptoms were:  O much better  O better O the same O worse   

## 2021-09-24 DIAGNOSIS — H6983 Other specified disorders of Eustachian tube, bilateral: Secondary | ICD-10-CM | POA: Diagnosis not present

## 2021-09-24 DIAGNOSIS — H9203 Otalgia, bilateral: Secondary | ICD-10-CM | POA: Diagnosis not present

## 2021-09-24 DIAGNOSIS — H903 Sensorineural hearing loss, bilateral: Secondary | ICD-10-CM | POA: Diagnosis not present

## 2021-09-24 DIAGNOSIS — J301 Allergic rhinitis due to pollen: Secondary | ICD-10-CM | POA: Diagnosis not present

## 2021-09-27 ENCOUNTER — Ambulatory Visit (INDEPENDENT_AMBULATORY_CARE_PROVIDER_SITE_OTHER): Payer: BC Managed Care – PPO | Admitting: Family Medicine

## 2021-09-27 ENCOUNTER — Other Ambulatory Visit: Payer: Self-pay

## 2021-09-27 DIAGNOSIS — N3281 Overactive bladder: Secondary | ICD-10-CM

## 2021-09-27 NOTE — Patient Instructions (Signed)
Tracking Your Bladder Symptoms    Patient Name:___________________________________________________   Sample: Day   Daytime Voids  Nighttime Voids Urgency for the Day(0-4) Number of Accidents Beverage Comments  Monday IIII II 2 I Water IIII Coffee  I      Week Starting:____________________________________   Day Daytime  Voids Nighttime  Voids Urgency for  The Day(0-4) Number of Accidents Beverages Comments                                                           This week my symptoms were:  O much better  O better O the same O worse   

## 2021-09-27 NOTE — Progress Notes (Signed)
PTNS  Session # 10 of 45  Health & Social Factors: no change Caffeine: 2  Alcohol: 0 Daytime voids #per day: 7 Night-time voids #per night: 0-1 Urgency: mild Incontinence Episodes #per day: 0 Ankle used: left Treatment Setting: 3 Feeling/ Response: sensory Comments: patient tolerated well  Performed By: Elberta Leatherwood, CMA  Follow Up: 1 week #11

## 2021-09-28 ENCOUNTER — Inpatient Hospital Stay
Admission: RE | Admit: 2021-09-28 | Discharge: 2021-09-28 | Disposition: A | Discharge status: Home / Self Care | Payer: Self-pay | Source: Ambulatory Visit | Attending: *Deleted | Admitting: *Deleted

## 2021-09-28 ENCOUNTER — Other Ambulatory Visit: Payer: Self-pay | Admitting: Internal Medicine

## 2021-09-28 ENCOUNTER — Other Ambulatory Visit: Payer: Self-pay | Admitting: *Deleted

## 2021-09-28 DIAGNOSIS — Z1231 Encounter for screening mammogram for malignant neoplasm of breast: Secondary | ICD-10-CM

## 2021-09-28 DIAGNOSIS — Z853 Personal history of malignant neoplasm of breast: Secondary | ICD-10-CM

## 2021-09-30 ENCOUNTER — Ambulatory Visit: Payer: BC Managed Care – PPO | Admitting: Oncology

## 2021-10-04 ENCOUNTER — Ambulatory Visit (INDEPENDENT_AMBULATORY_CARE_PROVIDER_SITE_OTHER): Payer: BC Managed Care – PPO | Admitting: Family Medicine

## 2021-10-04 ENCOUNTER — Other Ambulatory Visit: Payer: Self-pay

## 2021-10-04 DIAGNOSIS — N3281 Overactive bladder: Secondary | ICD-10-CM

## 2021-10-04 NOTE — Progress Notes (Signed)
PTNS  Session # 11  Health & Social Factors: no change Caffeine: 2 Alcohol: 0 Daytime voids #per day: 8 Night-time voids #per night: 0-1 Urgency: mild Incontinence Episodes #per day: 1 Ankle used: left Treatment Setting: 3 Feeling/ Response: sensory Comments: Patient tolerated well  Performed By: Elberta Leatherwood, CMA  Follow Up: 1 week #12

## 2021-10-04 NOTE — Patient Instructions (Signed)
Tracking Your Bladder Symptoms    Patient Name:___________________________________________________   Sample: Day   Daytime Voids  Nighttime Voids Urgency for the Day(0-4) Number of Accidents Beverage Comments  Monday IIII II 2 I Water IIII Coffee  I      Week Starting:____________________________________   Day Daytime  Voids Nighttime  Voids Urgency for  The Day(0-4) Number of Accidents Beverages Comments                                                           This week my symptoms were:  O much better  O better O the same O worse   

## 2021-10-11 ENCOUNTER — Ambulatory Visit
Admission: RE | Admit: 2021-10-11 | Discharge: 2021-10-11 | Disposition: A | Discharge status: Home / Self Care | Payer: BC Managed Care – PPO | Source: Ambulatory Visit | Attending: Internal Medicine | Admitting: Internal Medicine

## 2021-10-11 ENCOUNTER — Other Ambulatory Visit: Payer: Self-pay

## 2021-10-11 ENCOUNTER — Ambulatory Visit (INDEPENDENT_AMBULATORY_CARE_PROVIDER_SITE_OTHER): Payer: BC Managed Care – PPO | Admitting: Physician Assistant

## 2021-10-11 DIAGNOSIS — Z853 Personal history of malignant neoplasm of breast: Secondary | ICD-10-CM

## 2021-10-11 DIAGNOSIS — N3281 Overactive bladder: Secondary | ICD-10-CM

## 2021-10-11 DIAGNOSIS — Z1231 Encounter for screening mammogram for malignant neoplasm of breast: Secondary | ICD-10-CM

## 2021-10-11 DIAGNOSIS — R922 Inconclusive mammogram: Secondary | ICD-10-CM | POA: Diagnosis not present

## 2021-10-11 NOTE — Progress Notes (Signed)
PTNS  Session # 12/45  Health & Social Factors: No Change Caffeine: 2  Alcohol: 0  Daytime voids #per day: 7 Night-time voids #per night: 1 Urgency: Strong Incontinence Episodes #per day: 2 Ankle used: Left Treatment Setting: 1 Feeling/ Response: Sensory & Toe Flex Comments: Pt scheduled for 1 month f/u, Pt c/o lots od pressure with low volumes of urine when she attempts to void.  Performed By: Gordy Clement, CM  Follow Up: RTC in 1 month for f/u

## 2021-10-15 ENCOUNTER — Inpatient Hospital Stay: Payer: BC Managed Care – PPO

## 2021-10-15 ENCOUNTER — Inpatient Hospital Stay: Payer: BC Managed Care – PPO | Attending: Oncology | Admitting: Oncology

## 2021-10-15 ENCOUNTER — Encounter: Payer: Self-pay | Admitting: Oncology

## 2021-10-15 ENCOUNTER — Other Ambulatory Visit: Payer: Self-pay

## 2021-10-15 VITALS — BP 111/64 | HR 58 | Temp 97.6°F | Resp 18 | Wt 144.2 lb

## 2021-10-15 DIAGNOSIS — Z87440 Personal history of urinary (tract) infections: Secondary | ICD-10-CM | POA: Diagnosis not present

## 2021-10-15 DIAGNOSIS — M722 Plantar fascial fibromatosis: Secondary | ICD-10-CM | POA: Diagnosis not present

## 2021-10-15 DIAGNOSIS — R232 Flushing: Secondary | ICD-10-CM | POA: Diagnosis not present

## 2021-10-15 DIAGNOSIS — Z8249 Family history of ischemic heart disease and other diseases of the circulatory system: Secondary | ICD-10-CM | POA: Insufficient documentation

## 2021-10-15 DIAGNOSIS — Z9049 Acquired absence of other specified parts of digestive tract: Secondary | ICD-10-CM | POA: Insufficient documentation

## 2021-10-15 DIAGNOSIS — D0512 Intraductal carcinoma in situ of left breast: Secondary | ICD-10-CM | POA: Insufficient documentation

## 2021-10-15 DIAGNOSIS — Z803 Family history of malignant neoplasm of breast: Secondary | ICD-10-CM | POA: Insufficient documentation

## 2021-10-15 DIAGNOSIS — K219 Gastro-esophageal reflux disease without esophagitis: Secondary | ICD-10-CM | POA: Diagnosis not present

## 2021-10-15 DIAGNOSIS — Z800 Family history of malignant neoplasm of digestive organs: Secondary | ICD-10-CM | POA: Diagnosis not present

## 2021-10-15 DIAGNOSIS — R519 Headache, unspecified: Secondary | ICD-10-CM | POA: Insufficient documentation

## 2021-10-15 DIAGNOSIS — Z79899 Other long term (current) drug therapy: Secondary | ICD-10-CM | POA: Insufficient documentation

## 2021-10-15 DIAGNOSIS — Z87442 Personal history of urinary calculi: Secondary | ICD-10-CM | POA: Insufficient documentation

## 2021-10-15 DIAGNOSIS — M898X9 Other specified disorders of bone, unspecified site: Secondary | ICD-10-CM | POA: Diagnosis not present

## 2021-10-15 DIAGNOSIS — M8580 Other specified disorders of bone density and structure, unspecified site: Secondary | ICD-10-CM | POA: Insufficient documentation

## 2021-10-15 LAB — COMPREHENSIVE METABOLIC PANEL
ALT: 15 U/L (ref 0–44)
AST: 16 U/L (ref 15–41)
Albumin: 3.9 g/dL (ref 3.5–5.0)
Alkaline Phosphatase: 82 U/L (ref 38–126)
Anion gap: 8 (ref 5–15)
BUN: 20 mg/dL (ref 6–20)
CO2: 28 mmol/L (ref 22–32)
Calcium: 8.5 mg/dL — ABNORMAL LOW (ref 8.9–10.3)
Chloride: 103 mmol/L (ref 98–111)
Creatinine, Ser: 0.72 mg/dL (ref 0.44–1.00)
GFR, Estimated: >60 mL/min (ref >60)
Glucose, Bld: 107 mg/dL — ABNORMAL HIGH (ref 70–99)
Potassium: 3.7 mmol/L (ref 3.5–5.1)
Sodium: 139 mmol/L (ref 135–145)
Total Bilirubin: 0.5 mg/dL (ref 0.3–1.2)
Total Protein: 7.3 g/dL (ref 6.5–8.1)

## 2021-10-15 LAB — CBC WITH DIFFERENTIAL/PLATELET
Abs Immature Granulocytes: 0.01 10*3/uL (ref 0.00–0.07)
Basophils Absolute: 0 10*3/uL (ref 0.0–0.1)
Basophils Relative: 1 %
Eosinophils Absolute: 0 10*3/uL (ref 0.0–0.5)
Eosinophils Relative: 1 %
HCT: 40.7 % (ref 36.0–46.0)
Hemoglobin: 13.1 g/dL (ref 12.0–15.0)
Immature Granulocytes: 0 %
Lymphocytes Relative: 27 %
Lymphs Abs: 1.3 10*3/uL (ref 0.7–4.0)
MCH: 30 pg (ref 26.0–34.0)
MCHC: 32.2 g/dL (ref 30.0–36.0)
MCV: 93.3 fL (ref 80.0–100.0)
Monocytes Absolute: 0.5 10*3/uL (ref 0.1–1.0)
Monocytes Relative: 10 %
Neutro Abs: 2.9 10*3/uL (ref 1.7–7.7)
Neutrophils Relative %: 61 %
Platelets: 252 10*3/uL (ref 150–400)
RBC: 4.36 MIL/uL (ref 3.87–5.11)
RDW: 12.7 % (ref 11.5–15.5)
WBC: 4.7 10*3/uL (ref 4.0–10.5)
nRBC: 0 % (ref 0.0–0.2)

## 2021-10-15 NOTE — Progress Notes (Signed)
Pt here for follow up. No new concerns voiced.   

## 2021-10-15 NOTE — Progress Notes (Addendum)
Hematology/Oncology  Follow up note [facility] [phone] Fax:([phone]   Patient Care Team: Einar Pheasant, MD as PCP - General (Internal Medicine) Einar Pheasant, MD (Internal Medicine) Bary Castilla, Forest Gleason, MD (General Surgery) Earlie Server, MD as Consulting Physician (Oncology)  REFERRING PROVIDER: Einar Pheasant, MD  CHIEF COMPLAINTS/REASON FOR VISIT:  Follow up for DCIS  HISTORY OF PRESENTING ILLNESS:   Rose Bernard is a  55 y.o.  female with PMH listed below was seen in consultation at the request of  Einar Pheasant, MD  for evaluation of DCIS. 10/15/2019 screening bilateral mammogram showed possible left breast mass and calcification. 10/25/2019 patient had unilateral diagnostic mammogram of left breast which showed indeterminate mass in the left breast at 4:00.  Patient underwent ultrasound-guided biopsy of the left breast mass 12/12/2019 left breast ultrasound-guided biopsy showed atypical ductal hyperplastic. Patient was evaluated by Dr. Peyton Najjar and had left breast mass excisional biopsy on 01/10/2020. Pathology showed DCIS, nuclear grade 1-2, background columnar cell changes, papillary change, fibrocystic changes.  Size of the DCIS 21 mm, margins positive for DCIS in the anterior and inferior multifocal.  Patient was referred to cancer center for discussion of further management. Today she denies any new complaints.  Some soreness at the site of excisional biopsy. Menarche age 42. Patient had a hysterectomy at age of 2. She takes estradiol for hot flash symptoms for about a year half. Family history positive for maternal aunt and paternal aunt were both diagnosed with breast cancer, her father was diagnosed with colon cancer.  She had remote breast biopsy in 1991.    01/10/20 Initial lumpectomy showed positive DCIS margin. 01/31/20 Patient underwent reexcision and achieved a negative margin. Patient finished adjuvant radiation on  03/30/2020.  # Patient started on Arimidex around 04/18/2020 and after taking 2 days, she noticed left ankle swelling for about a week.  She presented to the emergency room on 04/25/2020 for evaluation.There was no DVT., however there does appear to be a superficial thrombophlebitis involving medial superficial veins overlying the ankle. Patient was advised to try NSAIDs and compression stocking.   Family history of cancer, declined genetic testing. Family history of thrombosis and personal history of superficial thrombophlebitis.  She had hypercoagulable work-up done including negative antiphospholipid antibody profile, negative factor V Leiden gene mutation, negative prothrombin gene mutation.  INTERVAL HISTORY Rose Bernard is a 55 y.o. female who has above history reviewed by me today presents to reestablish care for history of DCIS Patient was last seen by me on 05/12/2020.  At that time patient has developed superficial thrombophlebitis. patient then switched care to [facility] for second opinion.  Patient was initially recommended to resume Arimidex.  Patient developed a headache and a bone pain and was switched to exemestane.  Per care everywhere, no additional documented records of another visit with hematology oncology.  Per patient, she was not able to tolerate this as well and eventually she was recommended by [facility] to stay off medication. Patient was seen by primary care provider Dr. Nicki Reaper and bilateral diagnostic mammogram was obtained on 10/11/2021. Stable left lumpectomy site.  No evidence of malignancy. Patient was referred back to establish care with me for follow-up of DCIS. Patient denies any new breast concerns.  09/15/2021, patient was diagnosed of plantar fasciitis.  Patient was recommended to wear plantar fascial brace.  Review of Systems  Constitutional:  Negative for appetite change, chills, fatigue and fever.  HENT:   Negative for hearing loss and voice  change.   Eyes:  Negative  for eye problems.  Respiratory:  Negative for chest tightness and cough.   Cardiovascular:  Negative for chest pain.  Gastrointestinal:  Negative for abdominal distention, abdominal pain and blood in stool.  Endocrine: Negative for hot flashes.  Genitourinary:  Negative for difficulty urinating and frequency.   Musculoskeletal:  Negative for arthralgias.  Skin:  Negative for itching and rash.  Neurological:  Negative for extremity weakness.  Hematological:  Negative for adenopathy.  Psychiatric/Behavioral:  Negative for confusion.    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 Hysterectomy and bladder repair with subsequent appendectomy. L Ovary remain in place    APPENDECTOMY     2001 due to scar tissue   BLADDER BOTOX  2014, 2015, 2016   BREAST BIOPSY Bilateral 1991   BREAST BIOPSY Left 12/12/2019   [place] bx, path pending   BREAST SURGERY Bilateral 2002   benign mass removed   COLONOSCOPY  2014   EXCISION OF BREAST BIOPSY Left 01/10/2020   Procedure: EXCISION OF BREAST BIOPSY w/ Needle localization;  Surgeon: Herbert Pun, MD;  Location: [facility] ORS;  Service: General;  Laterality: Left;   FOOT SURGERY Bilateral    [place], PARTIAL Left 01/31/2020   Procedure: MASTECTOMY PARTIAL;  Surgeon: Herbert Pun, MD;  Location: [facility] ORS;  Service: General;  Laterality: Left;   reconstruction of bladder   1995    2/2 delivery complications with 3rd child     SOCIAL HISTORY: Social History   Socioeconomic History   Marital status: Married     Spouse name: Not on file   Number of children: 2   Years of education: Not on file   Highest education level: Not on file  Occupational History   Not on file  Tobacco Use   Smoking status: Never   Smokeless tobacco: Never  Vaping Use   Vaping Use: Never used  Substance and Sexual Activity   Alcohol use: No    Alcohol/week: 0.0 standard drinks   Drug use: No   Sexual activity: Yes    Birth control/protection: Surgical  Other Topics Concern   Not on file  Social History Narrative   Not on file   Social Determinants of Health   Financial Resource Strain: Not on file  Food Insecurity: Not on file  Transportation Needs: Not on file  Physical Activity: Not on file  Stress: Not on file  Social Connections: Not on file  Intimate Partner Violence: Not on file    FAMILY HISTORY: Family History  Problem Relation Age of Onset   Colon cancer Father        With ostomy in place    Breast cancer Paternal Aunt 58   Breast cancer Maternal Aunt    Breast cancer Maternal Aunt     ALLERGIES:  is allergic to penicillin v potassium.  MEDICATIONS:  Current Outpatient Medications  Medication Sig Dispense Refill   fluticasone (FLONASE) 50 MCG/ACT nasal spray Place 2 sprays into both nostrils daily. 1 g 0   Famotidine (  PEPCID PO) Take by mouth. (Patient not taking: Reported on 10/15/2021)     No current facility-administered medications for this visit.     PHYSICAL EXAMINATION: ECOG PERFORMANCE STATUS: 1 - Symptomatic but completely ambulatory Vitals:   10/15/21 1050  BP: 111/64  Pulse: (!) 58  Resp: 18  Temp: 97.6 F (36.4 C)   Filed Weights   10/15/21 1050  Weight: 144 lb 3.2 oz (65.4 kg)    Physical Exam Constitutional:      General: She is not in acute distress. HENT:     Head: Normocephalic and atraumatic.  Eyes:     General: No scleral icterus. Cardiovascular:     Rate and Rhythm: Normal rate and regular rhythm.     Heart sounds: Normal heart sounds.   Pulmonary:     Effort: Pulmonary effort is normal. No respiratory distress.     Breath sounds: No wheezing.  Abdominal:     General: Bowel sounds are normal. There is no distension.     Palpations: Abdomen is soft.  Musculoskeletal:        General: No deformity. Normal range of motion.     Cervical back: Normal range of motion and neck supple.  Skin:    General: Skin is warm and dry.     Findings: No erythema or rash.  Neurological:     Mental Status: She is alert and oriented to person, place, and time. Mental status is at baseline.     Cranial Nerves: No cranial nerve deficit.     Coordination: Coordination normal.  Psychiatric:        Mood and Affect: Mood normal.   Breast exam was performed in seated and lying down position. Left breast status post lumpectomy, tissue thickening at the lumpectomy sites.  Mild tender.  Otherwise no palpable discrete masses bilaterally.  No palpable axillary lymphadenopathy bilaterally.   LABORATORY DATA:  I have reviewed the data as listed Lab Results  Component Value Date   WBC 4.7 10/15/2021   HGB 13.1 10/15/2021   HCT 40.7 10/15/2021   MCV 93.3 10/15/2021   PLT 252 10/15/2021   Recent Labs    11/09/20 1147 03/29/21 1040 10/15/21 1119  NA 141 141 139  K 4.0 4.2 3.7  CL 104 104 103  CO2 32 31 28  GLUCOSE 81 80 107*  BUN 15 14 20   CREATININE 0.71 0.68 0.72  CALCIUM 9.2 9.2 8.5*  GFRNONAA  --   --  >60  PROT 6.7 6.9 7.3  ALBUMIN 4.1 4.1 3.9  AST 14 14 16   ALT 12 12 15   ALKPHOS 66 89 82  BILITOT 0.4 0.6 0.5    Iron/TIBC/Ferritin/ %Sat    Component Value Date/Time   IRON 94 04/20/2018 1611   TIBC 360 04/20/2018 1611   FERRITIN 11 04/20/2018 1611   IRONPCTSAT 26 04/20/2018 1611      RADIOGRAPHIC STUDIES: I have personally reviewed the radiological images as listed and agreed with the findings in the report.  MM DIAG BREAST TOMO BILATERAL  Result Date: 10/11/2021 CLINICAL DATA:  Left lumpectomy.  Annual  mammography. EXAM: DIGITAL DIAGNOSTIC BILATERAL MAMMOGRAM WITH TOMOSYNTHESIS AND CAD TECHNIQUE: Bilateral digital diagnostic mammography and breast tomosynthesis was performed. The images were evaluated with computer-aided detection. COMPARISON:  Previous exam(s). ACR Breast Density Category c: The breast tissue is heterogeneously dense, which may obscure small masses. FINDINGS: No suspicious masses, calcifications, or distortion are identified in either breast. The left lumpectomy site is stable. IMPRESSION: Stable  left lumpectomy site.  No evidence of malignancy. RECOMMENDATION: Annual diagnostic mammography. I have discussed the findings and recommendations with the patient. If applicable, a reminder letter will be sent to the patient regarding the next appointment. BI-RADS CATEGORY  2: Benign. Electronically Signed   By: Dorise Bullion III M.D.   On: 10/11/2021 09:49  MM Outside Films Mammo  Result Date: 09/28/2021 This examination belongs to an outside facility and is stored here for comparison purposes only.  Contact the originating outside institution for any associated report or interpretation.      ASSESSMENT & PLAN:  1. Ductal carcinoma in situ (DCIS) of left breast   2. Osteopenia, unspecified location    Left DCIS, grade 1-2 Status post lumpectomy and adjuvant radiation. Patient is not able to tolerate aromatase inhibitor.  Currently off endocrine therapy. Continue surveillance with annual mammogram. October 2022 mammogram was obtained by primary care provider.  She may continue to have mammogram ordered through primary care provider's office.  She knows to call my office if she wants me to order.  #History superficial thrombophlebitis of the left lower extremity, Resolved.  #Osteopenia, bone density was on 04/23/2020. Patient is not currently on any adjuvant endocrine therapy.  I will defer to her primary care provider for management plan.    Orders Placed This Encounter   Procedures   CBC with Differential/Platelet    Standing Status:   Future    Number of Occurrences:   1    Standing Expiration Date:   10/15/2022   Comprehensive metabolic panel    Standing Status:   Future    Number of Occurrences:   1    Standing Expiration Date:   10/15/2022   CBC with Differential/Platelet    Standing Status:   Future    Standing Expiration Date:   10/15/2022   Comprehensive metabolic panel    Standing Status:   Future    Standing Expiration Date:   10/15/2022    All questions were answered. The patient knows to call the clinic with any problems questions or concerns.   Return of visit: 12 months. Earlie Server, MD, PhD [facility] Hematology Oncology 10/15/2021

## 2021-10-20 ENCOUNTER — Ambulatory Visit (INDEPENDENT_AMBULATORY_CARE_PROVIDER_SITE_OTHER): Payer: BC Managed Care – PPO | Admitting: Podiatry

## 2021-10-20 ENCOUNTER — Ambulatory Visit (INDEPENDENT_AMBULATORY_CARE_PROVIDER_SITE_OTHER): Payer: BC Managed Care – PPO

## 2021-10-20 ENCOUNTER — Other Ambulatory Visit: Payer: Self-pay

## 2021-10-20 DIAGNOSIS — M722 Plantar fascial fibromatosis: Secondary | ICD-10-CM

## 2021-10-24 NOTE — Progress Notes (Signed)
  Subjective:  Patient ID: Rose Bernard, female    DOB: 01/05/1966,  MRN: [id number]  Chief Complaint  Patient presents with   Plantar Fasciitis    Right foot still very painful    55 y.o. female returns for follow-up with the above complaint. History confirmed with patient.  Plantar heel continues to be very painful, some slight relief since last visit  Objective:  Physical Exam: warm, good capillary refill, no trophic changes or ulcerative lesions, normal DP and PT pulses and normal sensory exam.  Right Foot: Sharp pain on palpation of plantar heel Radiographs: X-ray of the right foot: no fracture, dislocation, swelling or degenerative changes noted no evidence of stress fracture, no changes since last images Assessment:   1. Plantar fasciitis of right foot      Plan:  Patient was evaluated and treated and all questions answered.  Discussed the etiology and treatment options for plantar fasciitis including stretching, formal physical therapy, supportive shoegears such as a running shoe or sneaker, pre fabricated orthoses, injection therapy, and oral medications. We also discussed the role of surgical treatment of this for patients who do not improve after exhausting non-surgical treatment options.   -XR reviewed with patient -Educated patient on stretching and icing of the affected limb -Plantar fascial brace dispensed -Injection delivered to the plantar fascia of the right foot. -Continue Relafen. Educated on use, risks and benefits of the medication -CMO's inspected they are relatively new and they are in good shape.  I advised her to get a silicone gel pad to offload more pressure from the heel  After sterile prep with povidone-iodine solution and alcohol, the right heel was injected with 0.5cc 2% xylocaine plain, 0.5cc 0.5% marcaine plain, 5mg  triamcinolone acetonide, and 2mg  dexamethasone was injected along the medial plantar fascia at the insertion on the plantar  calcaneus. The patient tolerated the procedure well without complication.   No follow-ups on file.

## 2021-11-08 ENCOUNTER — Other Ambulatory Visit: Payer: Self-pay

## 2021-11-08 ENCOUNTER — Encounter: Payer: Self-pay | Admitting: Urology

## 2021-11-08 ENCOUNTER — Ambulatory Visit: Payer: BC Managed Care – PPO | Admitting: Urology

## 2021-11-08 ENCOUNTER — Ambulatory Visit (INDEPENDENT_AMBULATORY_CARE_PROVIDER_SITE_OTHER): Payer: BC Managed Care – PPO | Admitting: Urology

## 2021-11-08 VITALS — BP 109/70 | HR 76 | Ht 67.0 in | Wt 144.0 lb

## 2021-11-08 DIAGNOSIS — N3946 Mixed incontinence: Secondary | ICD-10-CM | POA: Diagnosis not present

## 2021-11-08 DIAGNOSIS — N3281 Overactive bladder: Secondary | ICD-10-CM

## 2021-11-08 LAB — URINALYSIS, COMPLETE
Bilirubin, UA: NEGATIVE
Glucose, UA: NEGATIVE
Ketones, UA: NEGATIVE
Leukocytes,UA: NEGATIVE
Nitrite, UA: NEGATIVE
Protein,UA: NEGATIVE
RBC, UA: NEGATIVE
Specific Gravity, UA: 1.02 (ref 1.005–1.030)
Urobilinogen, Ur: 0.2 mg/dL (ref 0.2–1.0)
pH, UA: 6 (ref 5.0–7.5)

## 2021-11-08 LAB — MICROSCOPIC EXAMINATION: Bacteria, UA: NONE SEEN

## 2021-11-08 MED ORDER — GEMTESA 75 MG PO TABS: 75.0000 mg | ORAL_TABLET | Freq: Every day | ORAL | 11 refills | Status: DC

## 2021-11-08 NOTE — Progress Notes (Signed)
11/08/2021 12:58 PM   Rose Bernard 10-03-1966 [id number]  Referring provider: Einar Pheasant, [address] [place],  [place] [address]  No chief complaint on file.   HPI: Dr Erlene Quan: Patient has refractory overactive bladder treated with Botox treatments.  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  She has urinary tract infections.  By history she has tried multiple medications and failed.   Patient has had approximately 2 Botox treatments for about 8 to 10 years.  The last 2 did not work well.  Years ago she was 90% improved and now they are less effective gradually.  Currently she has urge incontinence.  She leaks with coughing sneezing and when she lifts at work.  Both are significant.  She wears 2 pads a day that are soaked.  She has high-volume bedwetting.   She voids every hour depending on fluid intake and cannot hold urination for 2 hours.  She gets up to 3 times a night.  Flow was reasonable   It appears that she had a bladder injury during her childbirth with the reimplantation but has not had a bladder suspension.  She used to get bladder infections   Patient has mixed incontinence.  She also has stress incontinence but it does appear that the urge component is more severe especially with the bedwetting.   Patient was inquiring about InterStim but understands why the test was ordered   On urodynamics patient voided 64 mils of maximal flow of 36 mils per second.  Residual was 50 mils.  Maximum bladder capacity was 327 mL.  Bladder was stable.  There was no stress incontinence with a Valsalva pressure of 71 cm of water.  During voluntary voiding she voided 133 mL with a maximum flow of 5 mils per second.  Maximum voiding pressure 5 cm of water.  Residual 193 mL.  EMG mildly increased during the voiding phase.  She said she leaks more when she is standing up.  Uroflow pattern depressed and prolonged.     Ankles are healthy appearing with good  vascular supply.  No edema.  Few peripheral veins.  No neuropathy.  No steroids.  Importantly she had a blood clot in her right leg years ago.  If she has an ankle implant we would use the left    She has refractory urgency incontinence.  She may have mild stress incontinence not demonstrated on urodynamics.  She has failed Botox.  She has failed a number of medications over the years.  We talked about sacral nerve stimulation with usual template.   Handout of percutaneous tibial nerve stimulation and InterStim given.    I talked about the new FDA approved ankle implant and she does have private insurance that is changing.  We talked about efficacy and risks including infection, wound issues, pain, migration and sequelae, battery life 3 years approximate and changing hopefully to 5 years, MRI issues and activity issues.  I talked about the study and she understands that the battery would be rechargeable.  She would like to be called regarding it.  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  There is one machine that if she has a foreign body with metal in her body she would need a letter saying that she cannot go in it with either InterStim or ankle implant.  She would need to be off for approximately 2 weeks to avoid heavy lifting and working she does at work.  We would use left leg as noted  Today Patient had called in July stating she could not do the InterStim because of the work issue noted above.  She was treated with percutaneous tibial nerve stimulation.  Patient is doing the treatments and she thinks they helped some but not much if she drinks a lot of fluids like she like to.  She came in today with some diffuse nonspecific high back pain on the right and left.  Urine may be a little bit cloudy but not foul-smelling.  No particular increase in frequency.  Has had a kidney stone in the past.  Last 2 cultures negative.  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 Hysterectomy and bladder repair with subsequent appendectomy. L Ovary remain in place    APPENDECTOMY     2001 due to scar tissue   BLADDER BOTOX  2014, 2015, 2016   BREAST BIOPSY Bilateral 1991   BREAST BIOPSY Left 12/12/2019   [place] bx, path pending   BREAST SURGERY Bilateral 2002   benign mass removed   COLONOSCOPY  2014   EXCISION OF BREAST BIOPSY Left 01/10/2020   Procedure: EXCISION OF BREAST BIOPSY w/ Needle localization;  Surgeon: Herbert Pun, MD;  Location: [facility] ORS;  Service: General;  Laterality: Left;   FOOT SURGERY Bilateral    [place], PARTIAL Left 01/31/2020   Procedure: MASTECTOMY PARTIAL;  Surgeon: Herbert Pun, MD;  Location: [facility] ORS;  Service: General;  Laterality: Left;   reconstruction of bladder   1995    2/2 delivery complications with 3rd child     Home Medications:  Allergies as of 11/08/2021       Reactions   Penicillin V Potassium Rash   Did it involve swelling of the face/tongue/throat, SOB, or low BP? No Did it involve sudden or severe rash/hives, skin peeling, or any reaction on the inside of your mouth or nose? No Did you need to seek medical attention at a hospital or doctor's office? No When did it last happen?      25 years ago If all above answers are "NO", may proceed with cephalosporin use.        Medication List        Accurate as of November 08, 2021 12:58 PM. If you have any questions, ask your nurse or doctor.          fluticasone  50 MCG/ACT nasal spray Commonly known as: FLONASE Place 2 sprays into both nostrils daily.   PEPCID PO Take by mouth.        Allergies:  Allergies  Allergen Reactions   Penicillin V Potassium Rash    Did it involve swelling of the face/tongue/throat, SOB, or low BP? No Did it involve sudden or severe rash/hives, skin peeling, or any reaction on the inside of your mouth or nose? No Did you need to seek medical attention at a hospital or doctor's office? No When did it last  happen?      25 years ago If all above answers are "NO", may proceed with cephalosporin use.     Family History: Family History  Problem Relation Age of Onset   Colon cancer Father        With ostomy in place    Breast cancer Paternal Aunt 3   Breast cancer Maternal Aunt    Breast cancer Maternal Aunt     Social History:  reports that she has never smoked. She has never used smokeless tobacco. She reports that she does not drink alcohol and does not use drugs.  ROS:                                        Physical Exam: LMP 02/07/1994   Constitutional:  Alert and oriented, No acute distress. HEENT: [place] AT, moist mucus membranes.  Trachea midline, no masses.  Laboratory Data: Lab Results  Component Value Date   WBC 4.7 10/15/2021   HGB 13.1 10/15/2021   HCT 40.7 10/15/2021   MCV 93.3 10/15/2021   PLT 252 10/15/2021    Lab Results  Component Value Date   CREATININE 0.72 10/15/2021    No results found for: PSA  No results found for: TESTOSTERONE  Lab Results  Component Value Date   HGBA1C 5.6 10/01/2015    Urinalysis    Component Value Date/Time   COLORURINE YELLOW 02/08/2016 1733   APPEARANCEUR Hazy (A) 04/02/2021 1106   LABSPEC 1.020 02/08/2016 1733   PHURINE 6.0 02/08/2016 1733   GLUCOSEU Negative 04/02/2021 1106   GLUCOSEU NEGATIVE 02/08/2016 1733   HGBUR NEGATIVE 02/08/2016 1733   BILIRUBINUR Negative 04/02/2021 1106   KETONESUR NEGATIVE  02/08/2016 1733   PROTEINUR Negative 04/02/2021 1106   UROBILINOGEN 0.2 03/18/2016 1632   UROBILINOGEN 0.2 02/08/2016 1733   NITRITE Negative 04/02/2021 1106   NITRITE NEGATIVE 02/08/2016 1733   LEUKOCYTESUR Negative 04/02/2021 1106    Pertinent Imaging:   Assessment & Plan: Treatment options limited for refractory overactive bladder.  Likely does not have a bladder infection.  Call if her culture differs.  Role of Gemtesa discussed.  She did find out she cannot have a cardiac pacemaker and therefore cannot have InterStim at her job.  I gave her the Gemtesa samples.  Continue with percutaneous tibial nerve stimulation.  Urine looked normal but call if culture positive.  Only treat if positive.   There are no diagnoses linked to this encounter.  No follow-ups on file.  Reece Packer, MD  [facility] [address], [place], [place] [address] [phone]

## 2021-11-11 ENCOUNTER — Encounter: Payer: Self-pay | Admitting: Internal Medicine

## 2021-11-11 ENCOUNTER — Ambulatory Visit (INDEPENDENT_AMBULATORY_CARE_PROVIDER_SITE_OTHER): Payer: BC Managed Care – PPO | Admitting: Internal Medicine

## 2021-11-11 ENCOUNTER — Other Ambulatory Visit: Payer: Self-pay

## 2021-11-11 VITALS — BP 106/68 | HR 66 | Temp 96.5°F | Ht 67.0 in | Wt 147.4 lb

## 2021-11-11 DIAGNOSIS — R350 Frequency of micturition: Secondary | ICD-10-CM

## 2021-11-11 DIAGNOSIS — D649 Anemia, unspecified: Secondary | ICD-10-CM

## 2021-11-11 DIAGNOSIS — Z800 Family history of malignant neoplasm of digestive organs: Secondary | ICD-10-CM

## 2021-11-11 DIAGNOSIS — F439 Reaction to severe stress, unspecified: Secondary | ICD-10-CM

## 2021-11-11 DIAGNOSIS — Z0000 Encounter for general adult medical examination without abnormal findings: Secondary | ICD-10-CM

## 2021-11-11 DIAGNOSIS — D0512 Intraductal carcinoma in situ of left breast: Secondary | ICD-10-CM

## 2021-11-11 DIAGNOSIS — K219 Gastro-esophageal reflux disease without esophagitis: Secondary | ICD-10-CM

## 2021-11-11 MED ORDER — ZOSTER VAC RECOMB ADJUVANTED 50 MCG/0.5ML IM SUSR: 0.5000 mL | Freq: Once | INTRAMUSCULAR | 1 refills | Status: AC

## 2021-11-11 NOTE — Assessment & Plan Note (Signed)
Physical today 11/11/21.  Colonoscopy 07/2018.  Mammogram 10/11/21 - birads II.  PAP 07/18/19 - negative with negative HPV - (s/p hysterectomy)

## 2021-11-11 NOTE — Progress Notes (Signed)
Patient ID: Rose Bernard, female   DOB: [birth year], 55 y.o.   MRN: [id number]   Subjective:    Patient ID: Rose Bernard, female    DOB: 02-15-1966, 55 y.o.   MRN: [id number]  This visit occurred during the SARS-CoV-2 public health emergency.  Safety protocols were in place, including screening questions prior to the visit, additional usage of staff PPE, and extensive cleaning of exam room while observing appropriate contact time as indicated for disinfecting solutions.   Patient here for her physical exam.   Chief Complaint  Patient presents with   Annual Exam    Physical    .   HPI Continues to see urology for OAB. On gemtesa. Does help.  No chest pain or sob reported.  Acid reflux - pepcid.  Previous trigger finger.  S/p injection.  Resolved.  Bowels stable. 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  Not able to tolerate aromatase inhibitor.  Handling stress.     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 Hysterectomy and bladder repair with subsequent appendectomy. L Ovary remain in place    APPENDECTOMY     2001 due to scar tissue   BLADDER BOTOX  2014, 2015, 2016   BREAST BIOPSY Bilateral 1991   BREAST BIOPSY Left 12/12/2019   [place] bx, path pending   BREAST SURGERY Bilateral 2002   benign mass removed   COLONOSCOPY  2014   EXCISION OF BREAST BIOPSY Left 01/10/2020   Procedure: EXCISION OF BREAST BIOPSY w/ Needle localization;  Surgeon: Herbert Pun, MD;  Location: [facility] ORS;  Service: General;  Laterality: Left;   FOOT SURGERY Bilateral    [place], PARTIAL Left 01/31/2020    Procedure: MASTECTOMY PARTIAL;  Surgeon: Herbert Pun, MD;  Location: [facility] ORS;  Service: General;  Laterality: Left;   reconstruction of bladder   1995    2/2 delivery complications with 3rd child    Family History  Problem Relation Age of Onset   Colon cancer Father        With ostomy in place    Breast cancer Paternal Aunt 49   Breast cancer Maternal Aunt    Breast cancer Maternal Aunt    Social History   Socioeconomic History   Marital status: Married    Spouse name: Not on file   Number of children: 2   Years of education: Not on file   Highest education level: Not on file  Occupational History   Not on file  Tobacco Use   Smoking status: Never   Smokeless tobacco: Never  Vaping Use   Vaping Use: Never used  Substance and Sexual Activity   Alcohol use: No    Alcohol/week: 0.0 standard drinks   Drug use: No   Sexual activity: Yes    Birth control/protection: Surgical  Other Topics Concern   Not on file  Social History Narrative   Not on file   Social Determinants of Health   Financial Resource Strain: Not on file  Food Insecurity: Not on file  Transportation Needs: Not on file  Physical Activity: Not on file  Stress: Not on file  Social Connections: Not on file     Review of Systems  Constitutional:  Negative for appetite change and unexpected weight change.  HENT:  Negative for congestion, sinus pressure and sore throat.   Eyes:  Negative for pain and visual disturbance.  Respiratory:  Negative for cough, chest tightness and shortness of breath.   Cardiovascular:  Negative for chest pain, palpitations and leg swelling.  Gastrointestinal:  Negative for abdominal pain, diarrhea, nausea and vomiting.  Genitourinary:  Negative for difficulty urinating and dysuria.  Musculoskeletal:  Negative for joint swelling and myalgias.  Skin:  Negative for color change and rash.  Neurological:  Negative for dizziness, light-headedness and headaches.   Hematological:  Negative for adenopathy. Does not bruise/bleed easily.  Psychiatric/Behavioral:  Negative for agitation and dysphoric mood.       Objective:     BP 106/68 (BP Location: Left Arm, Patient Position: Sitting, Cuff Size: Normal)   Pulse 66   Temp (!) 96.5 F (35.8 C) (Temporal)   Ht 5\' 7"  (1.702 m)   Wt 147 lb 6.4 oz (66.9 kg)   LMP 02/07/1994   SpO2 99%   BMI 23.09 kg/m  Wt Readings from Last 3 Encounters:  11/11/21 147 lb 6.4 oz (66.9 kg)  11/08/21 144 lb (65.3 kg)  10/15/21 144 lb 3.2 oz (65.4 kg)    Physical Exam Vitals reviewed.  Constitutional:      General: She is not in acute distress.    Appearance: Normal appearance. She is well-developed.  HENT:     Head: Normocephalic and atraumatic.     Right Ear: External ear normal.     Left Ear: External ear normal.  Eyes:     General: No scleral icterus.       Right eye: No discharge.        Left eye: No discharge.     Conjunctiva/sclera: Conjunctivae normal.  Neck:     Thyroid: No thyromegaly.  Cardiovascular:     Rate and Rhythm: Normal rate and regular rhythm.  Pulmonary:     Effort: No tachypnea, accessory muscle usage or respiratory distress.     Breath sounds: Normal breath sounds. No decreased breath sounds or wheezing.  Chest:  Breasts:    Right: No inverted nipple, mass, nipple discharge or tenderness (no axillary adenopathy).     Left: No inverted nipple, mass, nipple discharge or tenderness (no axilarry adenopathy).  Abdominal:     General: Bowel sounds are normal.     Palpations: Abdomen is soft.     Tenderness: There is no abdominal tenderness.  Musculoskeletal:        General: No swelling or tenderness.     Cervical back: Neck supple.  Lymphadenopathy:     Cervical: No cervical adenopathy.  Skin:    Findings: No erythema or rash.  Neurological:     Mental Status: She is alert and oriented to person, place, and time.  Psychiatric:        Mood and Affect: Mood normal.         Behavior: Behavior normal.     Outpatient Encounter Medications as of 11/11/2021  Medication Sig   Famotidine (PEPCID PO) Take by mouth.   fluticasone (FLONASE) 50 MCG/ACT nasal spray Place 2 sprays into both nostrils daily.   Vibegron (GEMTESA) 75 MG TABS Take 75 mg by mouth daily at 6 (six) AM.   vitamin E 1000 UNIT capsule Take 1,000  Units by mouth daily.   [EXPIRED] Zoster Vaccine Adjuvanted [facility]) injection Inject 0.5 mLs into the muscle once for 1 dose.   No facility-administered encounter medications on file as of 11/11/2021.     Lab Results  Component Value Date   WBC 4.7 10/15/2021   HGB 13.1 10/15/2021   HCT 40.7 10/15/2021   PLT 252 10/15/2021   GLUCOSE 107 (H) 10/15/2021   CHOL 200 03/29/2021   TRIG 85.0 03/29/2021   HDL 71.30 03/29/2021   LDLCALC 112 (H) 03/29/2021   ALT 15 10/15/2021   AST 16 10/15/2021   NA 139 10/15/2021   K 3.7 10/15/2021   CL 103 10/15/2021   CREATININE 0.72 10/15/2021   BUN 20 10/15/2021   CO2 28 10/15/2021   TSH 1.87 11/09/2020   HGBA1C 5.6 10/01/2015    MM DIAG BREAST TOMO BILATERAL  Result Date: 10/11/2021 CLINICAL DATA:  Left lumpectomy.  Annual mammography. EXAM: DIGITAL DIAGNOSTIC BILATERAL MAMMOGRAM WITH TOMOSYNTHESIS AND CAD TECHNIQUE: Bilateral digital diagnostic mammography and breast tomosynthesis was performed. The images were evaluated with computer-aided detection. COMPARISON:  Previous exam(s). ACR Breast Density Category c: The breast tissue is heterogeneously dense, which may obscure small masses. FINDINGS: No suspicious masses, calcifications, or distortion are identified in either breast. The left lumpectomy site is stable. IMPRESSION: Stable left lumpectomy site.  No evidence of malignancy. RECOMMENDATION: Annual diagnostic mammography. I have discussed the findings and recommendations with the patient. If applicable, a reminder letter will be sent to the patient regarding the next appointment. BI-RADS CATEGORY  2:  Benign. Electronically Signed   By: Dorise Bullion III M.D.   On: 10/11/2021 09:49      Assessment & Plan:   Problem List Items Addressed This Visit     Anemia    Follow cbc.       DCIS (ductal carcinoma in situ)    S/p excision.  Being followed by oncology.  Completed XRT 03/2018.  Was on arimidex.  Off now.  Followed by oncology.  Per last notes, mammograms through our office.       Family history of colon cancer    Colonoscopy 07/2018 - diverticulum and internal hemorrhoids.       GERD (gastroesophageal reflux disease)    pepcid for acid reflux.        Health care maintenance    Physical today 11/11/21.  Colonoscopy 07/2018.  Mammogram 10/11/21 - birads II.  PAP 07/18/19 - negative with negative HPV - (s/p hysterectomy)       Stress    Overall appears to be handling things relatively well.  Follow.       Urinary frequency    Followed by urology.  Has tried multiple treatment modalities.  On gemtesa.  Follow.       Other Visit Diagnoses     Routine general medical examination at a health care facility    -  Primary        Einar Pheasant, MD

## 2021-11-14 ENCOUNTER — Encounter: Payer: Self-pay | Admitting: Internal Medicine

## 2021-11-14 NOTE — Assessment & Plan Note (Signed)
Follow cbc.  

## 2021-11-14 NOTE — Assessment & Plan Note (Signed)
Followed by urology.  Has tried multiple treatment modalities.  On gemtesa.  Follow.

## 2021-11-14 NOTE — Assessment & Plan Note (Signed)
pepcid for acid reflux.

## 2021-11-14 NOTE — Assessment & Plan Note (Signed)
Colonoscopy 07/2018 - diverticulum and internal hemorrhoids.  

## 2021-11-14 NOTE — Assessment & Plan Note (Signed)
S/p excision.  Being followed by oncology.  Completed XRT 03/2018.  Was on arimidex.  Off now.  Followed by oncology.  Per last notes, mammograms through our office.

## 2021-11-14 NOTE — Assessment & Plan Note (Signed)
Overall appears to be handling things relatively well.  Follow.   

## 2021-11-18 ENCOUNTER — Telehealth: Payer: Self-pay

## 2021-11-18 NOTE — Telephone Encounter (Addendum)
Spoke with patient-urine culture was not ordered only urinalysis. Denies any urinary symptoms. Has been taking Gemtesa daily with some improvement. Will keep follow up in January as scheduled.

## 2021-11-18 NOTE — Telephone Encounter (Signed)
Pt called triage line she states she is looking for the results of her urine culture from last office visit. I advised pt that I do not see that a culture was ordered, however I would check with the assistants who had the provider that day.

## 2021-11-22 ENCOUNTER — Other Ambulatory Visit: Payer: Self-pay

## 2021-11-22 ENCOUNTER — Ambulatory Visit (INDEPENDENT_AMBULATORY_CARE_PROVIDER_SITE_OTHER): Payer: BC Managed Care – PPO | Admitting: Podiatry

## 2021-11-22 DIAGNOSIS — M722 Plantar fascial fibromatosis: Secondary | ICD-10-CM | POA: Diagnosis not present

## 2021-11-22 NOTE — Progress Notes (Signed)
  Subjective:  Patient ID: Rose Bernard, female    DOB: October 26, 1966,  MRN: [id number]  Chief Complaint  Patient presents with   Plantar Fasciitis    5 week follow up right foot    55 y.o. female returns for follow-up with the above complaint. History confirmed with patient.  Plantar heel continues to be very painful, some slight relief since last visit  Objective:  Physical Exam: warm, good capillary refill, no trophic changes or ulcerative lesions, normal DP and PT pulses and normal sensory exam.  Right Foot: Sharp pain on palpation of plantar heel.  Gastrocnemius equinus is present Radiographs: X-ray of the right foot: no fracture, dislocation, swelling or degenerative changes noted no evidence of stress fracture, no changes since last images Assessment:   1. Plantar fasciitis of right foot      Plan:  Patient was evaluated and treated and all questions answered.  Discussed the etiology and treatment options for plantar fasciitis including stretching, formal physical therapy, supportive shoegears such as a running shoe or sneaker, pre fabricated orthoses, injection therapy, and oral medications. We also discussed the role of surgical treatment of this for patients who do not improve after exhausting non-surgical treatment options.  -I recommended we begin physical therapy and she will do this at [facility] or [place] location twice weekly -I recommend using a night splint because of the hurts when she gets out of bed in the middle the night or in the morning and she does have some equinus -Relafen was only slightly helpful she prefers using over-the-counter Aleve and she will continue Using this and wediscussed that she can use 2 of these twice daily -If not improving by next visit I recommend we get an MRI to evaluate and discuss other treatment options including possible surgical intervention  Return in about 6 weeks (around 01/03/2022) for recheck plantar fasciitis.

## 2021-12-01 ENCOUNTER — Telehealth: Payer: Self-pay | Admitting: Urology

## 2021-12-01 NOTE — Telephone Encounter (Signed)
Unable to reach patient. Sent my chart message. Samples left up front. Awaiting response on PA.

## 2021-12-01 NOTE — Telephone Encounter (Signed)
Patient states that she only has 1 pill left of the Gemtesa.  She is still waiting for her insurance to approve.  She is requesting to get samples  from our office to help her through the next week or so until she hears back from insurance.  She stated that the medication is helping and doesn't want to stop taking it.

## 2021-12-02 NOTE — Telephone Encounter (Addendum)
Prior authorization placed through insurance via telephone [phone]  PA pending. PA reference # [id number]    PA Approved. See scanned documents.

## 2021-12-08 MED ORDER — GEMTESA 75 MG PO TABS: 75.0000 mg | ORAL_TABLET | Freq: Every day | ORAL | 3 refills | Status: DC

## 2021-12-08 NOTE — Addendum Note (Signed)
Addended by: Alvera Novel on: 12/08/2021 02:34 PM   Modules accepted: Orders

## 2021-12-08 NOTE — Addendum Note (Signed)
Addended by: Alvera Novel on: 12/08/2021 02:18 PM   Modules accepted: Orders

## 2021-12-22 DIAGNOSIS — M25571 Pain in right ankle and joints of right foot: Secondary | ICD-10-CM | POA: Diagnosis not present

## 2021-12-22 DIAGNOSIS — R2689 Other abnormalities of gait and mobility: Secondary | ICD-10-CM | POA: Diagnosis not present

## 2021-12-27 ENCOUNTER — Telehealth: Payer: Self-pay

## 2021-12-27 ENCOUNTER — Other Ambulatory Visit: Payer: Self-pay

## 2021-12-27 ENCOUNTER — Ambulatory Visit (INDEPENDENT_AMBULATORY_CARE_PROVIDER_SITE_OTHER): Payer: BC Managed Care – PPO | Admitting: Urology

## 2021-12-27 ENCOUNTER — Encounter: Payer: Self-pay | Admitting: Urology

## 2021-12-27 VITALS — BP 127/67 | HR 84 | Ht 67.0 in | Wt 147.0 lb

## 2021-12-27 DIAGNOSIS — N3281 Overactive bladder: Secondary | ICD-10-CM | POA: Diagnosis not present

## 2021-12-27 DIAGNOSIS — N3946 Mixed incontinence: Secondary | ICD-10-CM | POA: Diagnosis not present

## 2021-12-27 DIAGNOSIS — R350 Frequency of micturition: Secondary | ICD-10-CM | POA: Diagnosis not present

## 2021-12-27 LAB — MICROSCOPIC EXAMINATION
Bacteria, UA: NONE SEEN
Epithelial Cells (non renal): NONE SEEN /hpf (ref 0–10)

## 2021-12-27 LAB — URINALYSIS, COMPLETE
Bilirubin, UA: NEGATIVE
Glucose, UA: NEGATIVE
Ketones, UA: NEGATIVE
Leukocytes,UA: NEGATIVE
Nitrite, UA: NEGATIVE
Protein,UA: NEGATIVE
RBC, UA: NEGATIVE
Specific Gravity, UA: 1.015 (ref 1.005–1.030)
Urobilinogen, Ur: 0.2 mg/dL (ref 0.2–1.0)
pH, UA: 7 (ref 5.0–7.5)

## 2021-12-27 NOTE — Telephone Encounter (Signed)
As per Dr.MacDiarmid give atleast 3 months of gemtesa samples at a time.   Will let pt know when samples come in.

## 2021-12-27 NOTE — Progress Notes (Signed)
12/27/2021 3:33 PM   Rose Bernard [birth year] [id number]  Referring provider: Einar Pheasant, [address] [place],  [place] [address]  Chief Complaint  Patient presents with   Over Active Bladder    HPI: Dr Erlene Quan: Patient has refractory overactive bladder treated with Botox treatments.  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  She has urinary tract infections.  By history she has tried multiple medications and failed.   Patient has had approximately 2 Botox treatments for about 8 to 10 years.  The last 2 did not work well.  Years ago she was 90% improved and now they are less effective gradually.  Currently she has urge incontinence.  She leaks with coughing sneezing and when she lifts at work.  Both are significant.  She wears 2 pads a day that are soaked.  She has high-volume bedwetting.   She voids every hour depending on fluid intake and cannot hold urination for 2 hours.  She gets up to 3 times a night.  Flow was reasonable   It appears that she had a bladder injury during her childbirth with the reimplantation but has not had a bladder suspension.  She used to get bladder infections   Patient has mixed incontinence.  She also has stress incontinence but it does appear that the urge component is more severe especially with the bedwetting.   Patient was inquiring about InterStim but understands why the test was ordered   On urodynamics patient voided 64 mils of maximal flow of 36 mils per second.  Residual was 50 mils.  Maximum bladder capacity was 327 mL.  Bladder was stable.  There was no stress incontinence with a Valsalva pressure of 71 cm of water.  During voluntary voiding she voided 133 mL with a maximum flow of 5 mils per second.  Maximum voiding pressure 5 cm of water.  Residual 193 mL.  EMG mildly increased during the voiding phase.  She said she leaks more when she is standing up.  Uroflow pattern depressed and prolonged.      Ankles are healthy appearing with good vascular supply.  No edema.  Few peripheral veins.  No neuropathy.  No steroids.  Importantly she had a blood clot in her right leg years ago.  If she has an ankle implant we would use the left     She has refractory urgency incontinence.  She may have mild stress incontinence not demonstrated on urodynamics.  She has failed Botox.  She has failed a number of medications over the years.  We talked about sacral nerve stimulation with usual template.   Handout of percutaneous tibial nerve stimulation and InterStim given.    I talked about the new FDA approved ankle implant and she does have private insurance that is changing.  We talked about efficacy and risks including infection, wound issues, pain, migration and sequelae, battery life 3 years approximate and changing hopefully to 5 years, MRI issues and activity issues.  I talked about the study and she understands that the battery would be rechargeable.  She would like to be called regarding it.  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  There is one machine that if she has a foreign body with metal in her body she would need a letter saying that she cannot go in it with either InterStim or ankle implant.  She would need to be off for approximately 2 weeks to avoid  heavy lifting and working she does at work.  We would use left leg as noted  Patient had called in July stating she could not do the InterStim because of the work issue noted above.  She was treated with percutaneous tibial nerve stimulation.   Patient is doing the treatments and she thinks they helped some but not much if she drinks a lot of fluids like she like to.  She came in today with some diffuse nonspecific high back pain on the right and left.  Urine may be a little bit cloudy but not foul-smelling.  No particular increase in frequency.  Has had a kidney stone in the past.  Last 2 cultures negative.  No kidney  stone on CT scan December 25, 2020    Treatment options limited for refractory overactive bladder.  Likely does not have a bladder infection.  Call if her culture differs.  Role of Gemtesa discussed.  She did find out she cannot have a cardiac pacemaker and therefore cannot have InterStim at her job.  I gave her the Gemtesa samples.  Continue with percutaneous tibial nerve stimulation.  Urine looked normal but call if culture positive.  Only treat if positive.   Today Frequency stable.  Culture was not sent last time.  Urine today looked good but was sent for culture. Patient is dramatically better on Gemtesa.  Rarely leaks during the day.  Rarely leaks at night.  It is $250 for 90 days.  I will give her 3 months of samples every 6 months to cut the cost in half.   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 Hysterectomy and bladder repair with subsequent appendectomy. L Ovary remain in place    APPENDECTOMY     2001 due to scar tissue   BLADDER BOTOX  2014, 2015, 2016   BREAST BIOPSY Bilateral 1991   BREAST BIOPSY Left 12/12/2019   [place] bx, path pending   BREAST SURGERY Bilateral 2002   benign mass removed   COLONOSCOPY  2014   EXCISION OF BREAST BIOPSY Left 01/10/2020   Procedure: EXCISION OF BREAST BIOPSY w/ Needle localization;  Surgeon: Herbert Pun, MD;  Location: [facility] ORS;  Service: General;  Laterality: Left;   FOOT SURGERY Bilateral    [place], PARTIAL Left 01/31/2020   Procedure: MASTECTOMY PARTIAL;  Surgeon: Herbert Pun, MD;  Location: [facility] ORS;   Service: General;  Laterality: Left;   reconstruction of bladder   1995    2/2 delivery complications with 3rd child     Home Medications:  Allergies as of 12/27/2021       Reactions   Penicillin V Potassium Rash   Did it involve swelling of the face/tongue/throat, SOB, or low BP? No Did it involve sudden or severe rash/hives, skin peeling, or any reaction on the inside of your mouth or nose? No Did you need to seek medical attention at a hospital or doctor's office? No When did it last happen?      25 years ago If all above answers are NO, may  proceed with cephalosporin use.        Medication List        Accurate as of December 27, 2021  3:33 PM. If you have any questions, ask your nurse or doctor.          fluticasone 50 MCG/ACT nasal spray Commonly known as: FLONASE Place 2 sprays into both nostrils daily.   Gemtesa 75 MG Tabs Generic drug: Vibegron Take 75 mg by mouth daily at 6 (six) AM.   PEPCID PO Take by mouth.   vitamin E 1000 UNIT capsule Take 1,000 Units by mouth daily.        Allergies:  Allergies  Allergen Reactions   Penicillin V Potassium Rash    Did it involve swelling of the face/tongue/throat, SOB, or low BP? No Did it involve sudden or severe rash/hives, skin peeling, or any reaction on the inside of your mouth or nose? No Did you need to seek medical attention at a hospital or doctor's office? No When did it last happen?      25 years ago If all above answers are NO, may proceed with cephalosporin use.     Family History: Family History  Problem Relation Age of Onset   Colon cancer Father        With ostomy in place    Breast cancer Paternal Aunt 30   Breast cancer Maternal Aunt    Breast cancer Maternal Aunt     Social History:  reports that she has never smoked. She has never used smokeless tobacco. She reports that she does not drink alcohol and does not use drugs.  ROS:                                         Physical Exam: LMP 02/07/1994   Constitutional:  Alert and oriented, No acute distress.   Laboratory Data: Lab Results  Component Value Date   WBC 4.7 10/15/2021   HGB 13.1 10/15/2021   HCT 40.7 10/15/2021   MCV 93.3 10/15/2021   PLT 252 10/15/2021    Lab Results  Component Value Date   CREATININE 0.72 10/15/2021    No results found for: PSA  No results found for: TESTOSTERONE  Lab Results  Component Value Date   HGBA1C 5.6 10/01/2015    Urinalysis    Component Value Date/Time   COLORURINE YELLOW 02/08/2016 1733   APPEARANCEUR Clear 11/08/2021 1314   LABSPEC 1.020 02/08/2016 1733   PHURINE 6.0 02/08/2016 1733   GLUCOSEU Negative 11/08/2021 1314   GLUCOSEU NEGATIVE 02/08/2016 1733   HGBUR NEGATIVE 02/08/2016 1733   BILIRUBINUR Negative 11/08/2021 1314   KETONESUR NEGATIVE 02/08/2016 1733   PROTEINUR Negative 11/08/2021 1314   UROBILINOGEN 0.2 03/18/2016 1632   UROBILINOGEN 0.2 02/08/2016 1733   NITRITE Negative 11/08/2021 1314   NITRITE NEGATIVE 02/08/2016 1733   LEUKOCYTESUR Negative 11/08/2021 1314    Pertinent Imaging:   Assessment & Plan: See every 6 months and give samples.  I am delighted it works so well  There are no diagnoses linked to this encounter.  No follow-ups on file.  Reece Packer, MD  [facility] [address] [place], [place] [address] [phone]

## 2021-12-28 DIAGNOSIS — M25571 Pain in right ankle and joints of right foot: Secondary | ICD-10-CM | POA: Diagnosis not present

## 2021-12-28 DIAGNOSIS — R2689 Other abnormalities of gait and mobility: Secondary | ICD-10-CM | POA: Diagnosis not present

## 2021-12-29 LAB — CULTURE, URINE COMPREHENSIVE

## 2021-12-30 NOTE — Telephone Encounter (Signed)
Samples left up front for patient. Left detailed message

## 2021-12-31 DIAGNOSIS — M25571 Pain in right ankle and joints of right foot: Secondary | ICD-10-CM | POA: Diagnosis not present

## 2021-12-31 DIAGNOSIS — R2689 Other abnormalities of gait and mobility: Secondary | ICD-10-CM | POA: Diagnosis not present

## 2022-01-03 ENCOUNTER — Telehealth: Payer: Self-pay

## 2022-01-03 DIAGNOSIS — M25571 Pain in right ankle and joints of right foot: Secondary | ICD-10-CM | POA: Diagnosis not present

## 2022-01-03 DIAGNOSIS — R2689 Other abnormalities of gait and mobility: Secondary | ICD-10-CM | POA: Diagnosis not present

## 2022-01-03 MED ORDER — GEMTESA 75 MG PO TABS: 75.0000 mg | ORAL_TABLET | Freq: Every day | ORAL | 3 refills | Status: DC

## 2022-01-03 NOTE — Telephone Encounter (Signed)
I spoke with Gemtesa rep. He advised me to send Gemtesa to [facility] and see if they are able to help her with decreasing price.

## 2022-01-05 ENCOUNTER — Ambulatory Visit: Payer: BC Managed Care – PPO | Admitting: Podiatry

## 2022-01-05 DIAGNOSIS — R2689 Other abnormalities of gait and mobility: Secondary | ICD-10-CM | POA: Diagnosis not present

## 2022-01-05 DIAGNOSIS — M25571 Pain in right ankle and joints of right foot: Secondary | ICD-10-CM | POA: Diagnosis not present

## 2022-01-10 DIAGNOSIS — R2689 Other abnormalities of gait and mobility: Secondary | ICD-10-CM | POA: Diagnosis not present

## 2022-01-10 DIAGNOSIS — M25571 Pain in right ankle and joints of right foot: Secondary | ICD-10-CM | POA: Diagnosis not present

## 2022-01-12 DIAGNOSIS — M25571 Pain in right ankle and joints of right foot: Secondary | ICD-10-CM | POA: Diagnosis not present

## 2022-01-12 DIAGNOSIS — R2689 Other abnormalities of gait and mobility: Secondary | ICD-10-CM | POA: Diagnosis not present

## 2022-01-17 DIAGNOSIS — R2689 Other abnormalities of gait and mobility: Secondary | ICD-10-CM | POA: Diagnosis not present

## 2022-01-17 DIAGNOSIS — M25571 Pain in right ankle and joints of right foot: Secondary | ICD-10-CM | POA: Diagnosis not present

## 2022-01-19 DIAGNOSIS — M25571 Pain in right ankle and joints of right foot: Secondary | ICD-10-CM | POA: Diagnosis not present

## 2022-01-19 DIAGNOSIS — R2689 Other abnormalities of gait and mobility: Secondary | ICD-10-CM | POA: Diagnosis not present

## 2022-01-20 NOTE — Telephone Encounter (Signed)
Sent through cover my meds.

## 2022-01-20 NOTE — Telephone Encounter (Signed)
Incoming call from covermymeds rep who wants to provide the KEY# for covermymeds PA.  Key- [id number]

## 2022-01-24 DIAGNOSIS — M25571 Pain in right ankle and joints of right foot: Secondary | ICD-10-CM | POA: Diagnosis not present

## 2022-01-24 DIAGNOSIS — R2689 Other abnormalities of gait and mobility: Secondary | ICD-10-CM | POA: Diagnosis not present

## 2022-01-25 NOTE — Telephone Encounter (Signed)
Prior auth not approved.

## 2022-01-26 DIAGNOSIS — R2689 Other abnormalities of gait and mobility: Secondary | ICD-10-CM | POA: Diagnosis not present

## 2022-01-26 DIAGNOSIS — M25571 Pain in right ankle and joints of right foot: Secondary | ICD-10-CM | POA: Diagnosis not present

## 2022-01-27 NOTE — Telephone Encounter (Signed)
Sandy from [facility] received a message from [place] regarding this patient and Logan Bores. [employer] did retart a new PA via cover my meds. KEY : [id number]. PA currently pending.   [facility] from [place]

## 2022-01-31 DIAGNOSIS — M25571 Pain in right ankle and joints of right foot: Secondary | ICD-10-CM | POA: Diagnosis not present

## 2022-01-31 DIAGNOSIS — R2689 Other abnormalities of gait and mobility: Secondary | ICD-10-CM | POA: Diagnosis not present

## 2022-02-01 NOTE — Telephone Encounter (Signed)
PA approved. See scanned documents.

## 2022-02-02 DIAGNOSIS — M25571 Pain in right ankle and joints of right foot: Secondary | ICD-10-CM | POA: Diagnosis not present

## 2022-02-02 DIAGNOSIS — R2689 Other abnormalities of gait and mobility: Secondary | ICD-10-CM | POA: Diagnosis not present

## 2022-02-04 ENCOUNTER — Encounter: Payer: Self-pay | Admitting: Internal Medicine

## 2022-02-07 DIAGNOSIS — M25571 Pain in right ankle and joints of right foot: Secondary | ICD-10-CM | POA: Diagnosis not present

## 2022-02-07 DIAGNOSIS — R2689 Other abnormalities of gait and mobility: Secondary | ICD-10-CM | POA: Diagnosis not present

## 2022-02-09 ENCOUNTER — Ambulatory Visit: Payer: BC Managed Care – PPO | Admitting: Podiatry

## 2022-02-09 DIAGNOSIS — R2689 Other abnormalities of gait and mobility: Secondary | ICD-10-CM | POA: Diagnosis not present

## 2022-02-09 DIAGNOSIS — M25571 Pain in right ankle and joints of right foot: Secondary | ICD-10-CM | POA: Diagnosis not present

## 2022-02-14 DIAGNOSIS — M79676 Pain in unspecified toe(s): Secondary | ICD-10-CM

## 2022-02-16 ENCOUNTER — Encounter: Payer: Self-pay | Admitting: Internal Medicine

## 2022-02-16 DIAGNOSIS — M25571 Pain in right ankle and joints of right foot: Secondary | ICD-10-CM | POA: Diagnosis not present

## 2022-02-16 DIAGNOSIS — R2689 Other abnormalities of gait and mobility: Secondary | ICD-10-CM | POA: Diagnosis not present

## 2022-02-23 ENCOUNTER — Encounter: Payer: Self-pay | Admitting: Internal Medicine

## 2022-02-25 ENCOUNTER — Telehealth: Payer: Self-pay | Admitting: *Deleted

## 2022-02-25 NOTE — Telephone Encounter (Signed)
Ms. Brucks requested her medical records on 02/14/2022.  I called and left her a message that her records and x-ray copies were ready to be picked up.  I informed her that she must show her identification and that there's a $5 charge for the x-ray disk. ?

## 2022-03-02 ENCOUNTER — Telehealth: Payer: Self-pay

## 2022-03-02 DIAGNOSIS — M25571 Pain in right ankle and joints of right foot: Secondary | ICD-10-CM | POA: Diagnosis not present

## 2022-03-02 NOTE — Telephone Encounter (Signed)
LMTCB to discuss FMLA ?

## 2022-03-07 ENCOUNTER — Encounter: Payer: Self-pay | Admitting: Internal Medicine

## 2022-03-07 DIAGNOSIS — M25579 Pain in unspecified ankle and joints of unspecified foot: Secondary | ICD-10-CM | POA: Insufficient documentation

## 2022-03-07 NOTE — Telephone Encounter (Signed)
Patient wanted to make sure that Dr Nicki Reaper knew that her FMLA paper work is due tomorrow, 03/08/2022. ?

## 2022-03-08 NOTE — Telephone Encounter (Signed)
Signed and placed in box.   

## 2022-03-09 DIAGNOSIS — M25571 Pain in right ankle and joints of right foot: Secondary | ICD-10-CM | POA: Diagnosis not present

## 2022-03-10 NOTE — Telephone Encounter (Signed)
Patient called and said her FMLA was not received, please refax to [phone].  ?

## 2022-03-14 ENCOUNTER — Encounter: Payer: Self-pay | Admitting: Internal Medicine

## 2022-03-14 ENCOUNTER — Ambulatory Visit (INDEPENDENT_AMBULATORY_CARE_PROVIDER_SITE_OTHER): Payer: BC Managed Care – PPO | Admitting: Internal Medicine

## 2022-03-14 DIAGNOSIS — D649 Anemia, unspecified: Secondary | ICD-10-CM | POA: Diagnosis not present

## 2022-03-14 DIAGNOSIS — M79671 Pain in right foot: Secondary | ICD-10-CM

## 2022-03-14 DIAGNOSIS — F439 Reaction to severe stress, unspecified: Secondary | ICD-10-CM | POA: Diagnosis not present

## 2022-03-14 DIAGNOSIS — R350 Frequency of micturition: Secondary | ICD-10-CM | POA: Diagnosis not present

## 2022-03-14 DIAGNOSIS — N319 Neuromuscular dysfunction of bladder, unspecified: Secondary | ICD-10-CM | POA: Diagnosis not present

## 2022-03-14 DIAGNOSIS — Z0289 Encounter for other administrative examinations: Secondary | ICD-10-CM

## 2022-03-14 DIAGNOSIS — D0512 Intraductal carcinoma in situ of left breast: Secondary | ICD-10-CM

## 2022-03-14 NOTE — Telephone Encounter (Signed)
Discussed at appt. FMLA paperwork was sent to scan ?

## 2022-03-14 NOTE — Progress Notes (Signed)
Patient ID: Rose Bernard, female   DOB: 07-Jan-1966, 56 y.o.   MRN: [id number] ? ? ?Subjective:  ? ? Patient ID: Rose Bernard, female    DOB: 07-Jul-1966, 56 y.o.   MRN: [id number] ? ?This visit occurred during the SARS-CoV-2 public health emergency.  Safety protocols were in place, including screening questions prior to the visit, additional usage of staff PPE, and extensive cleaning of exam room while observing appropriate contact time as indicated for disinfecting solutions.  ? ?Patient here for a scheduled follow up.  ? ?Chief Complaint  ?Patient presents with  ? Follow-up  ?  4 mo f/u - pt reports having back pain. Wondering if it is related to her kidneys.  ? .  ? ?HPI ?Seeing urology for continued bladder issues.  Given samples of gemtesa.  Still persistent issues.  Discussed f/u with urology.  No chest pain or sob reported.  No cough or congestion.  No abdominal pain.  Bowels moving.  Right foot pain.  Seeing ortho.  Has f/u planned 03/21/22.   ? ? ?Past Medical History:  ?Diagnosis Date  ? Chronic cystitis   ? Frequent UTI   ? GERD (gastroesophageal reflux disease)   ? History of kidney stones   ? Incomplete bladder emptying   ? 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 ?Past Surgical History:  ?Procedure Laterality Date  ? ABDOMINAL HYSTERECTOMY  1995  ? 2/2 delivery complications with 3rd child. Hysterectomy and bladder repair with subsequent appendectomy. L Ovary remain in place   ? APPENDECTOMY    ? 2001 due to scar tissue  ? BLADDER BOTOX  2014, 2015, 2016  ? BREAST BIOPSY Bilateral 1991  ? BREAST BIOPSY Left 12/12/2019  ? [place] bx, path pending  ? BREAST SURGERY Bilateral 2002  ? benign mass removed  ? COLONOSCOPY  2014  ? EXCISION OF BREAST BIOPSY Left 01/10/2020  ? Procedure: EXCISION OF BREAST BIOPSY w/ Needle localization;  Surgeon: Herbert Pun, MD;  Location: [facility];  Service: General;  Laterality: Left;  ? FOOT SURGERY Bilateral   ?  LAPAROSCOPIC HYSTERECTOMY  1995  ? MASTECTOMY, PARTIAL Left 01/31/2020  ? Procedure: MASTECTOMY PARTIAL;  Surgeon: Herbert Pun, MD;  Location: [facility];  Service: General;  Laterality: Left;  ? reconstruction of bladder   1995   ? 2/2 delivery complications with 3rd child   ? ?Family History  ?Problem Relation Age of Onset  ? Colon cancer Father   ?     With ostomy in place   ? Breast cancer Paternal Aunt 34  ? Breast cancer Maternal Aunt   ? Breast cancer Maternal Aunt   ? ?Social History  ? ?Socioeconomic History  ? Marital status: Married  ?  Spouse name: Not on file  ? Number of children: 2  ? Years of education: Not on file  ? Highest education level: Not on file  ?Occupational History  ? Not on file  ?Tobacco Use  ? Smoking status: Never  ? Smokeless tobacco: Never  ?Vaping Use  ? Vaping Use: Never used  ?Substance and Sexual Activity  ? Alcohol use: No  ?  Alcohol/week: 0.0 standard drinks  ? Drug use: No  ? Sexual activity: Yes  ?  Birth control/protection: Surgical  ?Other Topics Concern  ? Not on file  ?Social History Narrative  ? Not on file  ? ?Social Determinants of Health  ? ?Financial Resource Strain: Not on  file  ?Food Insecurity: Not on file  ?Transportation Needs: Not on file  ?Physical Activity: Not on file  ?Stress: Not on file  ?Social Connections: Not on file  ? ? ? ?Review of Systems  ?Constitutional:  Negative for appetite change and unexpected weight change.  ?HENT:  Negative for congestion and sinus pressure.   ?Respiratory:  Negative for cough, chest tightness and shortness of breath.   ?Cardiovascular:  Negative for chest pain, palpitations and leg swelling.  ?Gastrointestinal:  Negative for abdominal pain, diarrhea, nausea and vomiting.  ?Genitourinary:  Negative for difficulty urinating.  ?     Urinary issues as outlined.   ?Musculoskeletal:  Negative for joint swelling and myalgias.  ?Skin:  Negative for color change and rash.  ?Neurological:  Negative for dizziness,  light-headedness and headaches.  ?Psychiatric/Behavioral:  Negative for agitation and dysphoric mood.   ? ?   ?Objective:  ?  ? ?BP 124/82 (BP Location: Left Arm, Patient Position: Sitting, Cuff Size: Small)   Pulse 85   Temp 98.2 ?F (36.8 ?C) (Temporal)   Ht '5\' 7"'$  (1.702 m)   Wt 152 lb 6.4 oz (69.1 kg)   LMP 02/07/1994   SpO2 100%   BMI 23.87 kg/m?  ?Wt Readings from Last 3 Encounters:  ?03/14/22 152 lb 6.4 oz (69.1 kg)  ?12/27/21 147 lb (66.7 kg)  ?11/11/21 147 lb 6.4 oz (66.9 kg)  ? ? ?Physical Exam ?Vitals reviewed.  ?Constitutional:   ?   General: She is not in acute distress. ?   Appearance: Normal appearance.  ?HENT:  ?   Head: Normocephalic and atraumatic.  ?   Right Ear: External ear normal.  ?   Left Ear: External ear normal.  ?Eyes:  ?   General: No scleral icterus.    ?   Right eye: No discharge.     ?   Left eye: No discharge.  ?   Conjunctiva/sclera: Conjunctivae normal.  ?Neck:  ?   Thyroid: No thyromegaly.  ?Cardiovascular:  ?   Rate and Rhythm: Normal rate and regular rhythm.  ?Pulmonary:  ?   Effort: No respiratory distress.  ?   Breath sounds: Normal breath sounds. No wheezing.  ?Abdominal:  ?   General: Bowel sounds are normal.  ?   Palpations: Abdomen is soft.  ?   Tenderness: There is no abdominal tenderness.  ?Musculoskeletal:     ?   General: No swelling or tenderness.  ?   Cervical back: Neck supple. No tenderness.  ?Lymphadenopathy:  ?   Cervical: No cervical adenopathy.  ?Skin: ?   Findings: No erythema or rash.  ?Neurological:  ?   Mental Status: She is alert.  ?Psychiatric:     ?   Mood and Affect: Mood normal.     ?   Behavior: Behavior normal.  ? ? ? ?Outpatient Encounter Medications as of 03/14/2022  ?Medication Sig  ? Famotidine (PEPCID PO) Take by mouth.  ? fluticasone (FLONASE) 50 MCG/ACT nasal spray Place 2 sprays into both nostrils daily.  ? Vibegron (GEMTESA) 75 MG TABS Take 75 mg by mouth daily at 6 (six) AM.  ? vitamin E 1000 UNIT capsule Take 1,000 Units by mouth daily.   ? ?No facility-administered encounter medications on file as of 03/14/2022.  ?  ? ?Lab Results  ?Component Value Date  ? WBC 4.7 10/15/2021  ? HGB 13.1 10/15/2021  ? HCT 40.7 10/15/2021  ? PLT 252 10/15/2021  ? GLUCOSE 107 (H) 10/15/2021  ?  CHOL 200 03/29/2021  ? TRIG 85.0 03/29/2021  ? HDL 71.30 03/29/2021  ? LDLCALC 112 (H) 03/29/2021  ? ALT 15 10/15/2021  ? AST 16 10/15/2021  ? NA 139 10/15/2021  ? K 3.7 10/15/2021  ? CL 103 10/15/2021  ? CREATININE 0.72 10/15/2021  ? BUN 20 10/15/2021  ? CO2 28 10/15/2021  ? TSH 1.87 11/09/2020  ? HGBA1C 5.6 10/01/2015  ? ? ?MM DIAG BREAST TOMO BILATERAL ? ?Result Date: 10/11/2021 ?CLINICAL DATA:  Left lumpectomy.  Annual mammography. EXAM: DIGITAL DIAGNOSTIC BILATERAL MAMMOGRAM WITH TOMOSYNTHESIS AND CAD TECHNIQUE: Bilateral digital diagnostic mammography and breast tomosynthesis was performed. The images were evaluated with computer-aided detection. COMPARISON:  Previous exam(s). ACR Breast Density Category c: The breast tissue is heterogeneously dense, which may obscure small masses. FINDINGS: No suspicious masses, calcifications, or distortion are identified in either breast. The left lumpectomy site is stable. IMPRESSION: Stable left lumpectomy site.  No evidence of malignancy. RECOMMENDATION: Annual diagnostic mammography. I have discussed the findings and recommendations with the patient. If applicable, a reminder letter will be sent to the patient regarding the next appointment. BI-RADS CATEGORY  2: Benign. Electronically Signed   By: Dorise Bullion III M.D.   On: 10/11/2021 09:49 ? ? ?   ?Assessment & Plan:  ? ?Problem List Items Addressed This Visit   ? ? Anemia  ?  Follow cbc.  ?  ?  ? DCIS (ductal carcinoma in situ)  ?  S/p excision.  Being followed by oncology.  Completed XRT 03/2018.  Was on arimidex.  Off now.  Followed by oncology.  Per last notes, mammograms through our office.  Mammogram 10/11/21 - Birads II.  ?  ?  ? Disorder of bladder function  ?  Followed by  urology.  On gemtesa.  ?  ?  ? Encounter for completion of form with patient  ?  Forms completed and faxed.  Apparently did not receive.  Re fax.  ?  ?  ? Foot pain, right  ?  Seeing ortho.  Has f/u planned 03/21/22

## 2022-03-16 ENCOUNTER — Encounter: Payer: Self-pay | Admitting: Internal Medicine

## 2022-03-17 NOTE — Telephone Encounter (Signed)
Correct FMLA paper with appeal letter was faxed 03/17/22 '@16'$ :19. Confirmation was received with an OK status. ?

## 2022-03-17 NOTE — Telephone Encounter (Signed)
Pt called about FMLA paperwork. Pt wanted an update ?

## 2022-03-17 NOTE — Telephone Encounter (Signed)
Patient would like a call about her FMLA paper work ?

## 2022-03-17 NOTE — Telephone Encounter (Signed)
Per Rose Bernard, this has been taken care of. ?

## 2022-03-18 ENCOUNTER — Encounter: Payer: Self-pay | Admitting: Internal Medicine

## 2022-03-18 DIAGNOSIS — Z0289 Encounter for other administrative examinations: Secondary | ICD-10-CM | POA: Insufficient documentation

## 2022-03-18 NOTE — Assessment & Plan Note (Signed)
Followed by urology.  On gemtesa.   ?

## 2022-03-18 NOTE — Assessment & Plan Note (Signed)
Forms completed and faxed.  Apparently did not receive.  Re fax.  ?

## 2022-03-18 NOTE — Assessment & Plan Note (Signed)
Follow cbc.  

## 2022-03-18 NOTE — Assessment & Plan Note (Signed)
Overall appears to be handling things relatively well.  Follow.   

## 2022-03-18 NOTE — Assessment & Plan Note (Signed)
Followed by urology.  On gemtesa.  ?

## 2022-03-18 NOTE — Assessment & Plan Note (Addendum)
S/p excision.  Being followed by oncology.  Completed XRT 03/2018.  Was on arimidex.  Off now.  Followed by oncology.  Per last notes, mammograms through our office.  Mammogram 10/11/21 - Birads II.  

## 2022-03-18 NOTE — Assessment & Plan Note (Signed)
Seeing ortho.  Has f/u planned 03/21/22.  ?

## 2022-03-21 DIAGNOSIS — M25871 Other specified joint disorders, right ankle and foot: Secondary | ICD-10-CM | POA: Diagnosis not present

## 2022-03-21 DIAGNOSIS — M19071 Primary osteoarthritis, right ankle and foot: Secondary | ICD-10-CM | POA: Diagnosis not present

## 2022-03-21 DIAGNOSIS — M722 Plantar fascial fibromatosis: Secondary | ICD-10-CM | POA: Diagnosis not present

## 2022-03-28 ENCOUNTER — Telehealth: Payer: Self-pay | Admitting: Urology

## 2022-03-28 NOTE — Telephone Encounter (Signed)
Left message for pt to return call, pt may have UTI , should see PA this week to rule out UTI. ?

## 2022-03-28 NOTE — Telephone Encounter (Signed)
Patient is scheduled for Thursday 03/31/22 to see PA. ?

## 2022-03-28 NOTE — Telephone Encounter (Signed)
Patient is having problems with GEMTESA 75 MG. Her symptoms are urin urgency at night and day w/ leakage. Having back and abdominal pain off and on. Please avise ?

## 2022-03-31 ENCOUNTER — Ambulatory Visit (INDEPENDENT_AMBULATORY_CARE_PROVIDER_SITE_OTHER): Payer: BC Managed Care – PPO | Admitting: Physician Assistant

## 2022-03-31 ENCOUNTER — Encounter: Payer: Self-pay | Admitting: Physician Assistant

## 2022-03-31 VITALS — BP 146/66 | HR 76 | Ht 67.0 in | Wt 152.0 lb

## 2022-03-31 DIAGNOSIS — R350 Frequency of micturition: Secondary | ICD-10-CM

## 2022-03-31 LAB — URINALYSIS, COMPLETE
Bilirubin, UA: NEGATIVE
Glucose, UA: NEGATIVE
Ketones, UA: NEGATIVE
Leukocytes,UA: NEGATIVE
Nitrite, UA: NEGATIVE
Protein,UA: NEGATIVE
RBC, UA: NEGATIVE
Specific Gravity, UA: 1.025 (ref 1.005–1.030)
Urobilinogen, Ur: 0.2 mg/dL (ref 0.2–1.0)
pH, UA: 5.5 (ref 5.0–7.5)

## 2022-03-31 LAB — MICROSCOPIC EXAMINATION
Bacteria, UA: NONE SEEN
Epithelial Cells (non renal): NONE SEEN /hpf (ref 0–10)
WBC, UA: NONE SEEN /hpf (ref 0–5)

## 2022-03-31 LAB — BLADDER SCAN AMB NON-IMAGING: Scan Result: 13

## 2022-03-31 NOTE — Progress Notes (Signed)
? ?03/31/2022 ?2:10 PM  ? ?[name] ?01-01-1966 ?[id number] ? ?CC: ?Chief Complaint  ?Patient presents with  ? Urinary Frequency  ? ?HPI: ?Rose Bernard is a 56 y.o. female with PMH ureteral reimplant in the 1990s and severe refractory OAB who previously failed multiple medications, intravesical Botox, and PTNS currently on Gemtesa who presents today for evaluation of possible UTI.  ? ?Today she reports an approximate 1 month history of worsening urgency, frequency, and urinary leakage.  She is not sure that the [place] is working at all anymore, or at least not as well as it used to.  She has been engaging in fluid restriction.  Specifically, she notes increased stress incontinence. ? ?She underwent CT stone study on 12/25/2020 with no evidence of urolithiasis. ? ?In-office UA and microscopy today pan negative. PVR 76m. ? ?PMH: ?Past Medical History:  ?Diagnosis Date  ? Chronic cystitis   ? Frequent UTI   ? GERD (gastroesophageal reflux disease)   ? History of kidney stones   ? Incomplete bladder emptying   ? 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 ? ?Surgical History: ?Past Surgical History:  ?Procedure Laterality Date  ? ABDOMINAL HYSTERECTOMY  1995  ? 2/2 delivery complications with 3rd child. Hysterectomy and bladder repair with subsequent appendectomy. L Ovary remain in place   ? APPENDECTOMY    ? 2001 due to scar tissue  ? BLADDER BOTOX  2014, 2015, 2016  ? BREAST BIOPSY Bilateral 1991  ? BREAST BIOPSY Left 12/12/2019  ? [place]x, path pending  ? BREAST SURGERY Bilateral 2002  ? benign mass removed  ? COLONOSCOPY  2014  ? EXCISION OF BREAST BIOPSY Left 01/10/2020  ? Procedure: EXCISION OF BREAST BIOPSY w/ Needle localization;  Surgeon: CHerbert Pun MD;  Location: [facility];  Service: General;  Laterality: Left;  ? FOOT SURGERY Bilateral   ? LAPAROSCOPIC HYSTERECTOMY  1995  ? MASTECTOMY, PARTIAL Left 01/31/2020  ? Procedure: MASTECTOMY  PARTIAL;  Surgeon: CHerbert Pun MD;  Location: [facility];  Service: General;  Laterality: Left;  ? reconstruction of bladder   1995   ? 2/2 delivery complications with 3rd child   ? ? ?Home Medications:  ?Allergies as of 03/31/2022   ? ?   Reactions  ? Penicillin V Potassium Rash  ? Did it involve swelling of the face/tongue/throat, SOB, or low BP? No ?Did it involve sudden or severe rash/hives, skin peeling, or any reaction on the inside of your mouth or nose? No ?Did you need to seek medical attention at a hospital or doctor's office? No ?When did it last happen?      25 years ago ?If all above answers are ?NO?, may proceed with cephalosporin use.  ? ?  ? ?  ?Medication List  ?  ? ?  ? Accurate as of March 31, 2022  2:10 PM. If you have any questions, ask your nurse or doctor.  ?  ?  ? ?  ? ?fluticasone 50 MCG/ACT nasal spray ?Commonly known as: FLONASE ?Place 2 sprays into both nostrils daily. ?  ?Gemtesa 75 MG Tabs ?Generic drug: Vibegron ?Take 75 mg by mouth daily at 6 (six) AM. ?  ?PEPCID PO ?Take by mouth. ?  ?vitamin E 1000 UNIT capsule ?Take 1,000 Units by mouth daily. ?  ? ?  ? ? ?Allergies:  ?Allergies  ?Allergen Reactions  ? Penicillin V Potassium Rash  ?  Did it involve swelling  of the face/tongue/throat, SOB, or low BP? No ?Did it involve sudden or severe rash/hives, skin peeling, or any reaction on the inside of your mouth or nose? No ?Did you need to seek medical attention at a hospital or doctor's office? No ?When did it last happen?      25 years ago ?If all above answers are ?NO?, may proceed with cephalosporin use. ?  ? ? ?Family History: ?Family History  ?Problem Relation Age of Onset  ? Colon cancer Father   ?     With ostomy in place   ? Breast cancer Paternal Aunt 45  ? Breast cancer Maternal Aunt   ? Breast cancer Maternal Aunt   ? ? ?Social History:  ? reports that she has never smoked. She has never used smokeless tobacco. She reports that she does not drink alcohol and does not  use drugs. ? ?Physical Exam: ?BP (!) 146/66   Pulse 76   Ht '5\' 7"'$  (1.702 m)   Wt 152 lb (68.9 kg)   LMP 02/07/1994   BMI 23.81 kg/m?   ?Constitutional:  Alert and oriented, no acute distress, nontoxic appearing ?HEENT: [place], AT ?Cardiovascular: No clubbing, cyanosis, or edema ?Respiratory: Normal respiratory effort, no increased work of breathing ?Skin: No rashes, bruises or suspicious lesions ?Neurologic: Grossly intact, no focal deficits, moving all 4 extremities ?Psychiatric: Normal mood and affect ? ?Laboratory Data: ?Results for orders placed or performed in visit on 03/31/22  ?Microscopic Examination  ? Urine  ?Result Value Ref Range  ? WBC, UA None seen 0 - 5 /hpf  ? RBC 0-2 0 - 2 /hpf  ? Epithelial Cells (non renal) None seen 0 - 10 /hpf  ? Bacteria, UA None seen None seen/Few  ?Urinalysis, Complete  ?Result Value Ref Range  ? Specific Gravity, UA 1.025 1.005 - 1.030  ? pH, UA 5.5 5.0 - 7.5  ? Color, UA Yellow Yellow  ? Appearance Ur Clear Clear  ? Leukocytes,UA Negative Negative  ? Protein,UA Negative Negative/Trace  ? Glucose, UA Negative Negative  ? Ketones, UA Negative Negative  ? RBC, UA Negative Negative  ? Bilirubin, UA Negative Negative  ? Urobilinogen, Ur 0.2 0.2 - 1.0 mg/dL  ? Nitrite, UA Negative Negative  ? Microscopic Examination See below:   ?Bladder Scan (Post Void Residual) in office  ?Result Value Ref Range  ? Scan Result 13   ? ?Assessment & Plan:   ?1. Urinary frequency ?UA bland today, will send for culture to confirm, though my suspicion is this will be negative.  Suspect she is beginning to feel Gemtesa.  We discussed following up with her regularly scheduled appointment with Dr. Matilde Sprang next month to discuss other treatment options, though she knows these options are limited. ?- Urinalysis, Complete ?- Bladder Scan (Post Void Residual) in office ?- CULTURE, URINE COMPREHENSIVE  ? ?Return if symptoms worsen or fail to improve. ? ?Debroah Loop, PA-C ? ?[facility] ?[address] ?[place], [place] [address] ?[phone] ?   ?

## 2022-04-03 LAB — CULTURE, URINE COMPREHENSIVE

## 2022-04-04 ENCOUNTER — Telehealth: Payer: Self-pay

## 2022-04-04 NOTE — Telephone Encounter (Signed)
Pt LM on triage line stating that she is requesting the results of her most recent urine cx. She is still symptomatic. Please advise.  ?

## 2022-04-18 DIAGNOSIS — M19071 Primary osteoarthritis, right ankle and foot: Secondary | ICD-10-CM | POA: Diagnosis not present

## 2022-04-18 DIAGNOSIS — M2041 Other hammer toe(s) (acquired), right foot: Secondary | ICD-10-CM | POA: Diagnosis not present

## 2022-04-18 DIAGNOSIS — M2021 Hallux rigidus, right foot: Secondary | ICD-10-CM | POA: Diagnosis not present

## 2022-04-25 ENCOUNTER — Ambulatory Visit: Payer: BC Managed Care – PPO | Admitting: Urology

## 2022-05-02 ENCOUNTER — Ambulatory Visit: Payer: BC Managed Care – PPO | Admitting: Urology

## 2022-05-10 DIAGNOSIS — Z01818 Encounter for other preprocedural examination: Secondary | ICD-10-CM | POA: Diagnosis not present

## 2022-05-20 DIAGNOSIS — M19071 Primary osteoarthritis, right ankle and foot: Secondary | ICD-10-CM | POA: Diagnosis not present

## 2022-05-20 DIAGNOSIS — M2021 Hallux rigidus, right foot: Secondary | ICD-10-CM | POA: Diagnosis not present

## 2022-05-20 DIAGNOSIS — M79671 Pain in right foot: Secondary | ICD-10-CM | POA: Diagnosis not present

## 2022-05-20 DIAGNOSIS — M2041 Other hammer toe(s) (acquired), right foot: Secondary | ICD-10-CM | POA: Diagnosis not present

## 2022-05-20 DIAGNOSIS — G8918 Other acute postprocedural pain: Secondary | ICD-10-CM | POA: Diagnosis not present

## 2022-05-20 DIAGNOSIS — M24574 Contracture, right foot: Secondary | ICD-10-CM | POA: Diagnosis not present

## 2022-05-20 DIAGNOSIS — M205X1 Other deformities of toe(s) (acquired), right foot: Secondary | ICD-10-CM | POA: Diagnosis not present

## 2022-05-20 DIAGNOSIS — M2011 Hallux valgus (acquired), right foot: Secondary | ICD-10-CM | POA: Diagnosis not present

## 2022-06-02 DIAGNOSIS — M2041 Other hammer toe(s) (acquired), right foot: Secondary | ICD-10-CM | POA: Diagnosis not present

## 2022-06-02 DIAGNOSIS — M19071 Primary osteoarthritis, right ankle and foot: Secondary | ICD-10-CM | POA: Diagnosis not present

## 2022-06-02 DIAGNOSIS — M2021 Hallux rigidus, right foot: Secondary | ICD-10-CM | POA: Diagnosis not present

## 2022-06-13 ENCOUNTER — Encounter: Payer: Self-pay | Admitting: Internal Medicine

## 2022-06-13 ENCOUNTER — Ambulatory Visit (INDEPENDENT_AMBULATORY_CARE_PROVIDER_SITE_OTHER): Payer: BC Managed Care – PPO | Admitting: Internal Medicine

## 2022-06-13 VITALS — BP 128/64 | HR 70 | Temp 98.7°F | Resp 18 | Ht 67.0 in | Wt 155.4 lb

## 2022-06-13 DIAGNOSIS — Z13220 Encounter for screening for lipoid disorders: Secondary | ICD-10-CM

## 2022-06-13 DIAGNOSIS — D649 Anemia, unspecified: Secondary | ICD-10-CM

## 2022-06-13 DIAGNOSIS — Z800 Family history of malignant neoplasm of digestive organs: Secondary | ICD-10-CM

## 2022-06-13 DIAGNOSIS — Z9889 Other specified postprocedural states: Secondary | ICD-10-CM

## 2022-06-13 DIAGNOSIS — N319 Neuromuscular dysfunction of bladder, unspecified: Secondary | ICD-10-CM | POA: Diagnosis not present

## 2022-06-13 DIAGNOSIS — N3020 Other chronic cystitis without hematuria: Secondary | ICD-10-CM | POA: Diagnosis not present

## 2022-06-13 DIAGNOSIS — D0512 Intraductal carcinoma in situ of left breast: Secondary | ICD-10-CM

## 2022-06-13 DIAGNOSIS — F439 Reaction to severe stress, unspecified: Secondary | ICD-10-CM

## 2022-06-13 NOTE — Progress Notes (Signed)
Patient ID: Rose Bernard, female   DOB: 08/29/1966, 56 y.o.   MRN: [id number]   Subjective:    Patient ID: Rose Bernard, female    DOB: [birth year], 56 y.o.   MRN: [id number]   Patient here for a scheduled follow up.  Marland Kitchen   HPI Here to follow up regarding her bladder issues and is s/p recent foot/toe surgery.  Seeing ortho.  Needs ankle surgery as well.  Planning for this surgery in August - pending healing of her toe.  Limited activity and driving.  No chest pain or sob reported.  No abdominal pain reported.  Seeing urology  - OAB. On gemtesa.     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 Hysterectomy and bladder repair with subsequent appendectomy. L Ovary remain in place    APPENDECTOMY     2001 due to scar tissue   BLADDER BOTOX  2014, 2015, 2016   BREAST BIOPSY Bilateral 1991   BREAST BIOPSY Left 12/12/2019   [place] bx, path pending   BREAST SURGERY Bilateral 2002   benign mass removed   COLONOSCOPY  2014   EXCISION OF BREAST BIOPSY Left 01/10/2020   Procedure: EXCISION OF BREAST BIOPSY w/ Needle localization;  Surgeon: Herbert Pun, MD;  Location: [facility];  Service: General;  Laterality: Left;   FOOT SURGERY Bilateral    LAPAROSCOPIC HYSTERECTOMY  1995   MASTECTOMY, PARTIAL Left 01/31/2020   Procedure: MASTECTOMY PARTIAL;  Surgeon: Herbert Pun, MD;  Location: [facility];  Service: General;  Laterality: Left;   reconstruction of bladder   1995    2/2 delivery complications with 3rd child    Family History  Problem Relation Age of Onset   Colon cancer Father        With ostomy in place    Breast  cancer Paternal Aunt 74   Breast cancer Maternal Aunt    Breast cancer Maternal Aunt    Social History   Socioeconomic History   Marital status: Married    Spouse name: Not on file   Number of children: 2   Years of education: Not on file   Highest education level: Not on file  Occupational History   Not on file  Tobacco Use   Smoking status: Never   Smokeless tobacco: Never  Vaping Use   Vaping Use: Never used  Substance and Sexual Activity   Alcohol use: No    Alcohol/week: 0.0 standard drinks of alcohol   Drug use: No   Sexual activity: Yes    Birth control/protection: Surgical  Other Topics Concern   Not on file  Social History Narrative   Not on file   Social Determinants of Health   Financial Resource Strain: Not on file  Food Insecurity: Not on file  Transportation Needs: Not on file  Physical Activity: Not on file  Stress: Not on file  Social Connections: Not on file     Review of Systems  Constitutional:  Negative for appetite change and unexpected weight change.  HENT:  Negative for congestion and sinus pressure.   Respiratory:  Negative for cough,  chest tightness and shortness of breath.   Cardiovascular:  Negative for chest pain and palpitations.       No increased leg swelling.  Some soft tissue swelling s/p surgery - foot.   Gastrointestinal:  Negative for abdominal pain, diarrhea, nausea and vomiting.  Genitourinary:  Negative for difficulty urinating and dysuria.  Musculoskeletal:  Negative for joint swelling and myalgias.  Skin:  Negative for color change and rash.  Neurological:  Negative for dizziness, light-headedness and headaches.  Psychiatric/Behavioral:  Negative for agitation and dysphoric mood.        Objective:     BP 128/64 (BP Location: Left Arm, Patient Position: Sitting, Cuff Size: Small)   Pulse 70   Temp 98.7 F (37.1 C) (Temporal)   Resp 18   Ht '5\' 7"'$  (1.702 m)   Wt 155 lb 6.4 oz (70.5 kg)   LMP 02/07/1994   SpO2 98%    BMI 24.34 kg/m  Wt Readings from Last 3 Encounters:  06/13/22 155 lb 6.4 oz (70.5 kg)  03/31/22 152 lb (68.9 kg)  03/14/22 152 lb 6.4 oz (69.1 kg)    Physical Exam Vitals reviewed.  Constitutional:      General: She is not in acute distress.    Appearance: Normal appearance.  HENT:     Head: Normocephalic and atraumatic.     Right Ear: External ear normal.     Left Ear: External ear normal.  Eyes:     General: No scleral icterus.       Right eye: No discharge.        Left eye: No discharge.     Conjunctiva/sclera: Conjunctivae normal.  Neck:     Thyroid: No thyromegaly.  Cardiovascular:     Rate and Rhythm: Normal rate and regular rhythm.  Pulmonary:     Effort: No respiratory distress.     Breath sounds: Normal breath sounds. No wheezing.  Abdominal:     General: Bowel sounds are normal.     Palpations: Abdomen is soft.     Tenderness: There is no abdominal tenderness.  Musculoskeletal:        General: No swelling or tenderness.     Cervical back: Neck supple. No tenderness.  Lymphadenopathy:     Cervical: No cervical adenopathy.  Skin:    Findings: No erythema or rash.  Neurological:     Mental Status: She is alert.  Psychiatric:        Mood and Affect: Mood normal.        Behavior: Behavior normal.      Outpatient Encounter Medications as of 06/13/2022  Medication Sig   Famotidine (PEPCID PO) Take by mouth.   fluticasone (FLONASE) 50 MCG/ACT nasal spray Place 2 sprays into both nostrils daily.   Vibegron (GEMTESA) 75 MG TABS Take 75 mg by mouth daily at 6 (six) AM.   vitamin E 1000 UNIT capsule Take 1,000 Units by mouth daily.   No facility-administered encounter medications on file as of 06/13/2022.     Lab Results  Component Value Date   WBC 4.7 10/15/2021   HGB 13.1 10/15/2021   HCT 40.7 10/15/2021   PLT 252 10/15/2021   GLUCOSE 107 (H) 10/15/2021   CHOL 200 03/29/2021   TRIG 85.0 03/29/2021   HDL 71.30 03/29/2021   LDLCALC 112 (H) 03/29/2021    ALT 15 10/15/2021   AST 16 10/15/2021   NA 139 10/15/2021   K 3.7 10/15/2021   CL 103 10/15/2021   CREATININE  0.72 10/15/2021   BUN 20 10/15/2021   CO2 28 10/15/2021   TSH 1.87 11/09/2020   HGBA1C 5.6 10/01/2015    MM DIAG BREAST TOMO BILATERAL  Result Date: 10/11/2021 CLINICAL DATA:  Left lumpectomy.  Annual mammography. EXAM: DIGITAL DIAGNOSTIC BILATERAL MAMMOGRAM WITH TOMOSYNTHESIS AND CAD TECHNIQUE: Bilateral digital diagnostic mammography and breast tomosynthesis was performed. The images were evaluated with computer-aided detection. COMPARISON:  Previous exam(s). ACR Breast Density Category c: The breast tissue is heterogeneously dense, which may obscure small masses. FINDINGS: No suspicious masses, calcifications, or distortion are identified in either breast. The left lumpectomy site is stable. IMPRESSION: Stable left lumpectomy site.  No evidence of malignancy. RECOMMENDATION: Annual diagnostic mammography. I have discussed the findings and recommendations with the patient. If applicable, a reminder letter will be sent to the patient regarding the next appointment. BI-RADS CATEGORY  2: Benign. Electronically Signed   By: Dorise Bullion III M.D.   On: 10/11/2021 09:49      Assessment & Plan:   Problem List Items Addressed This Visit     Anemia    Follow cbc.      Relevant Orders   CBC with Differential/Platelet   TSH   Chronic cystitis    Followed by urology.       DCIS (ductal carcinoma in situ)    S/p excision.  Being followed by oncology.  Completed XRT 03/2018.  Was on arimidex.  Off now.  Followed by oncology.  Per last notes, mammograms through our office.  Mammogram 10/11/21 - Birads II.       Relevant Orders   Comprehensive metabolic panel   Disorder of bladder function    On gemtesa.  Followed by urology.       Family history of colon cancer    Colonoscopy 07/2018 - diverticulum and internal hemorrhoids.       History of foot surgery    Being  followed by ortho.        Stress    Overall appears to be handling things relatively well.  Follow.       Other Visit Diagnoses     Screening cholesterol level    -  Primary   Relevant Orders   Lipid panel        Einar Pheasant, MD

## 2022-06-19 ENCOUNTER — Encounter: Payer: Self-pay | Admitting: Internal Medicine

## 2022-06-19 DIAGNOSIS — Z9889 Other specified postprocedural states: Secondary | ICD-10-CM | POA: Insufficient documentation

## 2022-06-19 NOTE — Assessment & Plan Note (Signed)
Followed by urology.   

## 2022-06-19 NOTE — Assessment & Plan Note (Signed)
S/p excision.  Being followed by oncology.  Completed XRT 03/2018.  Was on arimidex.  Off now.  Followed by oncology.  Per last notes, mammograms through our office.  Mammogram 10/11/21 - Birads II.

## 2022-06-19 NOTE — Assessment & Plan Note (Signed)
Being followed by ortho.   

## 2022-06-19 NOTE — Assessment & Plan Note (Signed)
Colonoscopy 07/2018 - diverticulum and internal hemorrhoids.  

## 2022-06-19 NOTE — Assessment & Plan Note (Signed)
Follow cbc.  

## 2022-06-19 NOTE — Assessment & Plan Note (Signed)
Overall appears to be handling things relatively well.  Follow.   

## 2022-06-19 NOTE — Assessment & Plan Note (Signed)
On gemtesa.  Followed by urology.

## 2022-06-27 ENCOUNTER — Ambulatory Visit: Payer: BC Managed Care – PPO | Admitting: Urology

## 2022-06-27 ENCOUNTER — Ambulatory Visit (INDEPENDENT_AMBULATORY_CARE_PROVIDER_SITE_OTHER): Payer: BC Managed Care – PPO | Admitting: Urology

## 2022-06-27 ENCOUNTER — Encounter: Payer: Self-pay | Admitting: Urology

## 2022-06-27 VITALS — BP 122/74 | HR 61 | Ht 67.0 in | Wt 154.0 lb

## 2022-06-27 DIAGNOSIS — N3281 Overactive bladder: Secondary | ICD-10-CM | POA: Diagnosis not present

## 2022-06-27 DIAGNOSIS — N3946 Mixed incontinence: Secondary | ICD-10-CM

## 2022-06-27 LAB — URINALYSIS, COMPLETE
Bilirubin, UA: NEGATIVE
Glucose, UA: NEGATIVE
Ketones, UA: NEGATIVE
Nitrite, UA: NEGATIVE
Protein,UA: NEGATIVE
RBC, UA: NEGATIVE
Specific Gravity, UA: 1.02 (ref 1.005–1.030)
Urobilinogen, Ur: 0.2 mg/dL (ref 0.2–1.0)
pH, UA: 5.5 (ref 5.0–7.5)

## 2022-06-27 LAB — MICROSCOPIC EXAMINATION: WBC, UA: >30 /hpf — AB (ref 0–5)

## 2022-06-27 NOTE — Progress Notes (Signed)
06/27/2022 8:55 AM   Rose Bernard Mar 22, 1966 [id number]  Referring provider: Einar Pheasant, [address] [place],  [place] [address]  No chief complaint on file.   HPI: Reviewed the extensive history from December 27, 2021.  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  She has failed Botox.  She has been on Gemtesa and percutaneous tibial nerve stimulation.  Following nurse practitioner in April with worsening symptoms.  Culture was normal on that day  Patient no longer does PTNS or takes [place]  She had pressure in April but a negative culture.  She had little bit of pressure yesterday and took some cranberry.  She has some bedwetting but is not nightly.  She has urge incontinence during the day but does not wear a pad.  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 Hysterectomy and bladder repair with subsequent appendectomy. L Ovary remain in place    APPENDECTOMY     2001 due to scar tissue   BLADDER BOTOX  2014, 2015, 2016   BREAST BIOPSY Bilateral 1991   BREAST BIOPSY Left 12/12/2019   [place] bx, path pending   BREAST SURGERY Bilateral 2002   benign mass removed   COLONOSCOPY  2014   EXCISION OF BREAST BIOPSY Left 01/10/2020   Procedure: EXCISION OF BREAST BIOPSY w/ Needle localization;  Surgeon: Herbert Pun, MD;  Location: [facility];  Service: General;  Laterality: Left;   FOOT SURGERY Bilateral    [place],  PARTIAL Left 01/31/2020   Procedure: MASTECTOMY PARTIAL;  Surgeon: Herbert Pun, MD;  Location: [facility];  Service: General;  Laterality: Left;   reconstruction of bladder   1995    2/2 delivery complications with 3rd child     Home Medications:  Allergies as of 06/27/2022       Reactions   Penicillin V Potassium Rash   Did it involve swelling of the face/tongue/throat, SOB, or low BP? No Did it involve sudden or severe rash/hives, skin peeling, or any reaction on the inside of your mouth or nose? No Did you need to seek medical attention at a hospital or doctor's office? No When did it last happen?      25 years ago If all above answers are "NO", may proceed with cephalosporin use.        Medication List        Accurate as of June 27, 2022  8:55 AM. If you have any questions, ask your nurse or doctor.          fluticasone 50 MCG/ACT nasal spray Commonly known as: FLONASE Place 2 sprays into both nostrils daily.   Gemtesa 75 MG Tabs Generic drug: Vibegron Take 75 mg by mouth daily at 6 (six) AM.   PEPCID PO Take by mouth.   vitamin E 1000 UNIT capsule Take 1,000 Units by mouth daily.        Allergies:  Allergies  Allergen Reactions   Penicillin V Potassium Rash    Did it involve swelling of the face/tongue/throat, SOB, or low BP? No Did it involve sudden or severe rash/hives, skin peeling, or any reaction on the inside of your mouth or nose? No Did you need to seek medical attention at a hospital or doctor's office? No When did it last happen?      25 years ago If all above answers are "NO", may proceed with cephalosporin use.     Family History: Family History  Problem Relation Age of Onset   Colon cancer Father        With ostomy in place    Breast cancer Paternal Aunt 87   Breast cancer Maternal Aunt    Breast cancer Maternal Aunt     Social History:  reports that she has never smoked. She has never used smokeless tobacco. She reports  that she does not drink alcohol and does not use drugs.  ROS:                                        Physical Exam: LMP 02/07/1994   Constitutional:  Alert and oriented, No acute distress.  Laboratory Data: Lab Results  Component Value Date   WBC 4.7 10/15/2021   HGB 13.1 10/15/2021   HCT 40.7 10/15/2021   MCV 93.3 10/15/2021   PLT 252 10/15/2021    Lab Results  Component Value Date   CREATININE 0.72 10/15/2021    No results found for: "PSA"  No results found for: "TESTOSTERONE"  Lab Results  Component Value Date   HGBA1C 5.6 10/01/2015    Urinalysis    Component Value Date/Time   COLORURINE YELLOW 02/08/2016 1733   APPEARANCEUR Clear 03/31/2022 1355   LABSPEC 1.020 02/08/2016 1733   PHURINE 6.0 02/08/2016 1733   GLUCOSEU Negative 03/31/2022 1355   GLUCOSEU NEGATIVE 02/08/2016 1733   HGBUR NEGATIVE 02/08/2016 1733   BILIRUBINUR Negative 03/31/2022 1355   KETONESUR NEGATIVE 02/08/2016 1733   PROTEINUR Negative 03/31/2022 1355   UROBILINOGEN 0.2 03/18/2016 1632   UROBILINOGEN 0.2 02/08/2016 1733   NITRITE Negative 03/31/2022 1355   NITRITE NEGATIVE 02/08/2016 1733   LEUKOCYTESUR Negative 03/31/2022 1355    Pertinent Imaging:   Assessment & Plan: Urine sent for culture.  I think we have reached the end of the treatment pathway.  I will see her as needed  There are no diagnoses linked to this encounter.  No follow-ups on file.  Reece Packer, MD  [facility] [address], [place], [place] [address] [phone]

## 2022-07-01 ENCOUNTER — Telehealth: Payer: Self-pay

## 2022-07-01 LAB — CULTURE, URINE COMPREHENSIVE

## 2022-07-01 MED ORDER — NITROFURANTOIN MONOHYD MACRO 100 MG PO CAPS: 100.0000 mg | ORAL_CAPSULE | Freq: Two times a day (BID) | ORAL | 0 refills | Status: DC

## 2022-07-01 NOTE — Telephone Encounter (Signed)
Pt aware, medication sent to pharmacy,

## 2022-07-01 NOTE — Telephone Encounter (Signed)
-----   Message from Bjorn Loser, MD sent at 07/01/2022 11:45 AM EDT ----- Macrodantin 100 mg twice a day for 7 days ----- Message ----- From: Alvera Novel, CMA Sent: 07/01/2022   8:57 AM EDT To: Bjorn Loser, MD   ----- Message ----- From: Interface, [facility] Lab Results In Sent: 06/27/2022  11:36 AM EDT To: [facility]

## 2022-07-04 DIAGNOSIS — M25871 Other specified joint disorders, right ankle and foot: Secondary | ICD-10-CM | POA: Diagnosis not present

## 2022-07-04 DIAGNOSIS — M2041 Other hammer toe(s) (acquired), right foot: Secondary | ICD-10-CM | POA: Diagnosis not present

## 2022-07-04 DIAGNOSIS — M65871 Other synovitis and tenosynovitis, right ankle and foot: Secondary | ICD-10-CM | POA: Diagnosis not present

## 2022-07-04 DIAGNOSIS — M2021 Hallux rigidus, right foot: Secondary | ICD-10-CM | POA: Diagnosis not present

## 2022-07-05 ENCOUNTER — Other Ambulatory Visit (INDEPENDENT_AMBULATORY_CARE_PROVIDER_SITE_OTHER): Payer: BC Managed Care – PPO

## 2022-07-05 DIAGNOSIS — Z13220 Encounter for screening for lipoid disorders: Secondary | ICD-10-CM

## 2022-07-05 DIAGNOSIS — D649 Anemia, unspecified: Secondary | ICD-10-CM | POA: Diagnosis not present

## 2022-07-05 DIAGNOSIS — D0512 Intraductal carcinoma in situ of left breast: Secondary | ICD-10-CM

## 2022-07-05 LAB — LIPID PANEL
Cholesterol: 230 mg/dL — ABNORMAL HIGH (ref 0–200)
HDL: 70.2 mg/dL (ref >39.00)
LDL Cholesterol: 141 mg/dL — ABNORMAL HIGH (ref 0–99)
NonHDL: 159.97
Total CHOL/HDL Ratio: 3
Triglycerides: 96 mg/dL (ref 0.0–149.0)
VLDL: 19.2 mg/dL (ref 0.0–40.0)

## 2022-07-05 LAB — COMPREHENSIVE METABOLIC PANEL
ALT: 14 U/L (ref 0–35)
AST: 14 U/L (ref 0–37)
Albumin: 4.3 g/dL (ref 3.5–5.2)
Alkaline Phosphatase: 95 U/L (ref 39–117)
BUN: 16 mg/dL (ref 6–23)
CO2: 30 mEq/L (ref 19–32)
Calcium: 9 mg/dL (ref 8.4–10.5)
Chloride: 104 mEq/L (ref 96–112)
Creatinine, Ser: 0.68 mg/dL (ref 0.40–1.20)
GFR: 97.33 mL/min (ref >60.00)
Glucose, Bld: 81 mg/dL (ref 70–99)
Potassium: 3.8 mEq/L (ref 3.5–5.1)
Sodium: 141 mEq/L (ref 135–145)
Total Bilirubin: 0.5 mg/dL (ref 0.2–1.2)
Total Protein: 6.9 g/dL (ref 6.0–8.3)

## 2022-07-05 LAB — CBC WITH DIFFERENTIAL/PLATELET
Basophils Absolute: 0.1 10*3/uL (ref 0.0–0.1)
Basophils Relative: 2.1 % (ref 0.0–3.0)
Eosinophils Absolute: 0.1 10*3/uL (ref 0.0–0.7)
Eosinophils Relative: 2.5 % (ref 0.0–5.0)
HCT: 39.7 % (ref 36.0–46.0)
Hemoglobin: 13.3 g/dL (ref 12.0–15.0)
Lymphocytes Relative: 36.5 % (ref 12.0–46.0)
Lymphs Abs: 1.4 10*3/uL (ref 0.7–4.0)
MCHC: 33.6 g/dL (ref 30.0–36.0)
MCV: 91.7 fl (ref 78.0–100.0)
Monocytes Absolute: 0.4 10*3/uL (ref 0.1–1.0)
Monocytes Relative: 11.9 % (ref 3.0–12.0)
Neutro Abs: 1.7 10*3/uL (ref 1.4–7.7)
Neutrophils Relative %: 47 % (ref 43.0–77.0)
Platelets: 269 10*3/uL (ref 150.0–400.0)
RBC: 4.33 Mil/uL (ref 3.87–5.11)
RDW: 13.5 % (ref 11.5–15.5)
WBC: 3.7 10*3/uL — ABNORMAL LOW (ref 4.0–10.5)

## 2022-07-05 LAB — TSH: TSH: 2.72 u[IU]/mL (ref 0.35–5.50)

## 2022-07-06 ENCOUNTER — Other Ambulatory Visit: Payer: Self-pay

## 2022-07-06 DIAGNOSIS — D72819 Decreased white blood cell count, unspecified: Secondary | ICD-10-CM

## 2022-07-15 DIAGNOSIS — M659 Synovitis and tenosynovitis, unspecified: Secondary | ICD-10-CM | POA: Diagnosis not present

## 2022-07-15 DIAGNOSIS — G8918 Other acute postprocedural pain: Secondary | ICD-10-CM | POA: Diagnosis not present

## 2022-07-15 DIAGNOSIS — M25871 Other specified joint disorders, right ankle and foot: Secondary | ICD-10-CM | POA: Diagnosis not present

## 2022-07-15 DIAGNOSIS — M65871 Other synovitis and tenosynovitis, right ankle and foot: Secondary | ICD-10-CM | POA: Diagnosis not present

## 2022-07-16 DIAGNOSIS — M25871 Other specified joint disorders, right ankle and foot: Secondary | ICD-10-CM | POA: Diagnosis not present

## 2022-07-26 DIAGNOSIS — L821 Other seborrheic keratosis: Secondary | ICD-10-CM | POA: Diagnosis not present

## 2022-07-26 DIAGNOSIS — L570 Actinic keratosis: Secondary | ICD-10-CM | POA: Diagnosis not present

## 2022-07-26 DIAGNOSIS — D2271 Melanocytic nevi of right lower limb, including hip: Secondary | ICD-10-CM | POA: Diagnosis not present

## 2022-07-29 ENCOUNTER — Other Ambulatory Visit: Payer: Self-pay

## 2022-07-29 ENCOUNTER — Telehealth: Payer: Self-pay | Admitting: Internal Medicine

## 2022-07-29 DIAGNOSIS — D72819 Decreased white blood cell count, unspecified: Secondary | ICD-10-CM

## 2022-07-29 NOTE — Telephone Encounter (Signed)
Lab ordered.

## 2022-07-29 NOTE — Telephone Encounter (Signed)
Patient has a lab appt 08/01/2022, there are no orders in.

## 2022-08-01 ENCOUNTER — Other Ambulatory Visit (INDEPENDENT_AMBULATORY_CARE_PROVIDER_SITE_OTHER): Payer: BC Managed Care – PPO

## 2022-08-01 DIAGNOSIS — D72819 Decreased white blood cell count, unspecified: Secondary | ICD-10-CM

## 2022-08-01 LAB — CBC WITH DIFFERENTIAL/PLATELET
Basophils Absolute: 0.1 10*3/uL (ref 0.0–0.1)
Basophils Relative: 1.7 % (ref 0.0–3.0)
Eosinophils Absolute: 0.1 10*3/uL (ref 0.0–0.7)
Eosinophils Relative: 2.6 % (ref 0.0–5.0)
HCT: 38.3 % (ref 36.0–46.0)
Hemoglobin: 12.8 g/dL (ref 12.0–15.0)
Lymphocytes Relative: 29.9 % (ref 12.0–46.0)
Lymphs Abs: 1.2 10*3/uL (ref 0.7–4.0)
MCHC: 33.4 g/dL (ref 30.0–36.0)
MCV: 91.1 fl (ref 78.0–100.0)
Monocytes Absolute: 0.4 10*3/uL (ref 0.1–1.0)
Monocytes Relative: 10.9 % (ref 3.0–12.0)
Neutro Abs: 2.3 10*3/uL (ref 1.4–7.7)
Neutrophils Relative %: 54.9 % (ref 43.0–77.0)
Platelets: 299 10*3/uL (ref 150.0–400.0)
RBC: 4.2 Mil/uL (ref 3.87–5.11)
RDW: 13.2 % (ref 11.5–15.5)
WBC: 4.1 10*3/uL (ref 4.0–10.5)

## 2022-08-11 DIAGNOSIS — R2241 Localized swelling, mass and lump, right lower limb: Secondary | ICD-10-CM | POA: Diagnosis not present

## 2022-08-11 DIAGNOSIS — M79661 Pain in right lower leg: Secondary | ICD-10-CM | POA: Diagnosis not present

## 2022-08-17 DIAGNOSIS — M25471 Effusion, right ankle: Secondary | ICD-10-CM | POA: Diagnosis not present

## 2022-08-17 DIAGNOSIS — M6281 Muscle weakness (generalized): Secondary | ICD-10-CM | POA: Diagnosis not present

## 2022-08-17 DIAGNOSIS — M25571 Pain in right ankle and joints of right foot: Secondary | ICD-10-CM | POA: Diagnosis not present

## 2022-08-22 DIAGNOSIS — M25471 Effusion, right ankle: Secondary | ICD-10-CM | POA: Diagnosis not present

## 2022-08-22 DIAGNOSIS — M25571 Pain in right ankle and joints of right foot: Secondary | ICD-10-CM | POA: Diagnosis not present

## 2022-08-22 DIAGNOSIS — M6281 Muscle weakness (generalized): Secondary | ICD-10-CM | POA: Diagnosis not present

## 2022-08-24 DIAGNOSIS — M6281 Muscle weakness (generalized): Secondary | ICD-10-CM | POA: Diagnosis not present

## 2022-08-24 DIAGNOSIS — M25571 Pain in right ankle and joints of right foot: Secondary | ICD-10-CM | POA: Diagnosis not present

## 2022-08-24 DIAGNOSIS — M25471 Effusion, right ankle: Secondary | ICD-10-CM | POA: Diagnosis not present

## 2022-08-26 DIAGNOSIS — J019 Acute sinusitis, unspecified: Secondary | ICD-10-CM | POA: Diagnosis not present

## 2022-08-26 DIAGNOSIS — J301 Allergic rhinitis due to pollen: Secondary | ICD-10-CM | POA: Diagnosis not present

## 2022-08-26 DIAGNOSIS — M25571 Pain in right ankle and joints of right foot: Secondary | ICD-10-CM | POA: Diagnosis not present

## 2022-08-26 DIAGNOSIS — M6281 Muscle weakness (generalized): Secondary | ICD-10-CM | POA: Diagnosis not present

## 2022-08-26 DIAGNOSIS — M25471 Effusion, right ankle: Secondary | ICD-10-CM | POA: Diagnosis not present

## 2022-08-29 ENCOUNTER — Other Ambulatory Visit: Payer: Self-pay | Admitting: Internal Medicine

## 2022-08-29 ENCOUNTER — Other Ambulatory Visit: Payer: Self-pay | Admitting: Oncology

## 2022-08-29 DIAGNOSIS — Z1231 Encounter for screening mammogram for malignant neoplasm of breast: Secondary | ICD-10-CM

## 2022-08-29 DIAGNOSIS — Z853 Personal history of malignant neoplasm of breast: Secondary | ICD-10-CM

## 2022-08-30 DIAGNOSIS — M25471 Effusion, right ankle: Secondary | ICD-10-CM | POA: Diagnosis not present

## 2022-08-30 DIAGNOSIS — M25571 Pain in right ankle and joints of right foot: Secondary | ICD-10-CM | POA: Diagnosis not present

## 2022-08-30 DIAGNOSIS — M6281 Muscle weakness (generalized): Secondary | ICD-10-CM | POA: Diagnosis not present

## 2022-09-01 DIAGNOSIS — M25471 Effusion, right ankle: Secondary | ICD-10-CM | POA: Diagnosis not present

## 2022-09-01 DIAGNOSIS — M25571 Pain in right ankle and joints of right foot: Secondary | ICD-10-CM | POA: Diagnosis not present

## 2022-09-01 DIAGNOSIS — M6281 Muscle weakness (generalized): Secondary | ICD-10-CM | POA: Diagnosis not present

## 2022-09-06 DIAGNOSIS — M25571 Pain in right ankle and joints of right foot: Secondary | ICD-10-CM | POA: Diagnosis not present

## 2022-09-06 DIAGNOSIS — M6281 Muscle weakness (generalized): Secondary | ICD-10-CM | POA: Diagnosis not present

## 2022-09-06 DIAGNOSIS — M25471 Effusion, right ankle: Secondary | ICD-10-CM | POA: Diagnosis not present

## 2022-09-08 ENCOUNTER — Telehealth: Payer: Self-pay | Admitting: Internal Medicine

## 2022-09-08 DIAGNOSIS — M7989 Other specified soft tissue disorders: Secondary | ICD-10-CM

## 2022-09-08 NOTE — Telephone Encounter (Signed)
Requesting a Vein and Vascular referral. Patient went to [facility] for her leg. He Roma Kayser [phone]) stated that she should go to vein and vascular

## 2022-09-09 DIAGNOSIS — M25571 Pain in right ankle and joints of right foot: Secondary | ICD-10-CM | POA: Diagnosis not present

## 2022-09-09 DIAGNOSIS — M25471 Effusion, right ankle: Secondary | ICD-10-CM | POA: Diagnosis not present

## 2022-09-09 DIAGNOSIS — M6281 Muscle weakness (generalized): Secondary | ICD-10-CM | POA: Diagnosis not present

## 2022-09-09 NOTE — Telephone Encounter (Signed)
Copy of note from [facility] is in the medical records folder.  [place] Dod[place],cma

## 2022-09-09 NOTE — Telephone Encounter (Signed)
I have placed the referral to [facility].  See note.  Pt states was seen at [facility] and they recommended referral.  Need copy of [facility] office note.

## 2022-09-14 DIAGNOSIS — M6281 Muscle weakness (generalized): Secondary | ICD-10-CM | POA: Diagnosis not present

## 2022-09-14 DIAGNOSIS — M25471 Effusion, right ankle: Secondary | ICD-10-CM | POA: Diagnosis not present

## 2022-09-14 DIAGNOSIS — M25571 Pain in right ankle and joints of right foot: Secondary | ICD-10-CM | POA: Diagnosis not present

## 2022-09-17 ENCOUNTER — Telehealth: Payer: Self-pay | Admitting: Internal Medicine

## 2022-09-17 NOTE — Telephone Encounter (Signed)
See previous phone note.  Referral for vascular has been placed and she has appt scheduled.  Previous records have been sent from [facility].  Need any notes from 08/11/22 until now (if any).  Please request for review.  Thanks.

## 2022-09-19 NOTE — Telephone Encounter (Signed)
Noted  

## 2022-09-20 DIAGNOSIS — M25571 Pain in right ankle and joints of right foot: Secondary | ICD-10-CM | POA: Diagnosis not present

## 2022-09-20 DIAGNOSIS — M25471 Effusion, right ankle: Secondary | ICD-10-CM | POA: Diagnosis not present

## 2022-09-20 DIAGNOSIS — M6281 Muscle weakness (generalized): Secondary | ICD-10-CM | POA: Diagnosis not present

## 2022-09-22 DIAGNOSIS — J329 Chronic sinusitis, unspecified: Secondary | ICD-10-CM | POA: Diagnosis not present

## 2022-09-22 DIAGNOSIS — J301 Allergic rhinitis due to pollen: Secondary | ICD-10-CM | POA: Diagnosis not present

## 2022-09-30 DIAGNOSIS — J301 Allergic rhinitis due to pollen: Secondary | ICD-10-CM | POA: Diagnosis not present

## 2022-09-30 DIAGNOSIS — R519 Headache, unspecified: Secondary | ICD-10-CM | POA: Diagnosis not present

## 2022-10-04 ENCOUNTER — Encounter (INDEPENDENT_AMBULATORY_CARE_PROVIDER_SITE_OTHER): Payer: Self-pay | Admitting: Vascular Surgery

## 2022-10-04 ENCOUNTER — Ambulatory Visit (INDEPENDENT_AMBULATORY_CARE_PROVIDER_SITE_OTHER): Payer: BC Managed Care – PPO | Admitting: Vascular Surgery

## 2022-10-04 VITALS — BP 146/72 | HR 72 | Resp 16 | Wt 157.4 lb

## 2022-10-04 DIAGNOSIS — I890 Lymphedema, not elsewhere classified: Secondary | ICD-10-CM | POA: Diagnosis not present

## 2022-10-04 DIAGNOSIS — I872 Venous insufficiency (chronic) (peripheral): Secondary | ICD-10-CM | POA: Diagnosis not present

## 2022-10-04 NOTE — Assessment & Plan Note (Signed)
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  Venous reflux at her convenience.

## 2022-10-04 NOTE — Progress Notes (Signed)
Patient ID: Rose Bernard, female   DOB: [birth year], 56 y.o.   MRN: [id number]  Chief Complaint  Patient presents with   Follow-up    Ref Scott right leg swelling    HPI Rose Bernard is a 56 y.o. female.  I am asked to see the patient by Dr. Nicki Reaper for evaluation of right leg swelling.  She was actually seen a couple of years ago by my partner and diagnosed with some degree of lymphedema at that time.  She has a previous history of a DVT in that right leg several years ago.  She does not have any left leg symptoms.  No ulceration or infection.  No fevers or chills.  She has been wearing compression socks now for a few months without significant improvement in her leg swelling.  No chest pain or shortness of breath.  She denies any current bleeding issues.  She had 2 foot and ankle surgeries and the swelling seemed to get significantly worse after that.     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 Hysterectomy and bladder repair with subsequent appendectomy. L Ovary remain in place    APPENDECTOMY     2001 due to scar tissue   BLADDER BOTOX  2014, 2015, 2016   BREAST BIOPSY Bilateral 1991   BREAST BIOPSY Left 12/12/2019   [place] bx, path pending   BREAST SURGERY Bilateral 2002   benign mass removed   COLONOSCOPY  2014   EXCISION OF BREAST BIOPSY Left 01/10/2020   Procedure: EXCISION OF BREAST BIOPSY w/ Needle localization;  Surgeon: Herbert Pun, MD;  Location: [facility];  Service: General;  Laterality: Left;   FOOT SURGERY Bilateral    LAPAROSCOPIC HYSTERECTOMY  1995   MASTECTOMY, PARTIAL Left 01/31/2020    Procedure: MASTECTOMY PARTIAL;  Surgeon: Herbert Pun, MD;  Location: [facility];  Service: General;  Laterality: Left;   reconstruction of bladder   1995    2/2 delivery complications with 3rd child      Family History  Problem Relation Age of Onset   Colon cancer Father        With ostomy in place    Breast cancer Paternal Aunt 58   Breast cancer Maternal Aunt    Breast cancer Maternal Aunt       Social History   Tobacco Use   Smoking status: Never   Smokeless tobacco: Never  Vaping Use   Vaping Use: Never used  Substance Use Topics   Alcohol use: No    Alcohol/week: 0.0 standard drinks of alcohol   Drug use: No     Allergies  Allergen Reactions   Penicillin V Potassium Rash    Did it involve swelling of the face/tongue/throat, SOB, or low BP? No Did it involve sudden or severe rash/hives, skin peeling, or any reaction on the inside of your mouth or nose? No Did you need to seek medical attention at a hospital or doctor's office? No When did it last happen?      25 years ago If all above answers are "NO", may proceed with  cephalosporin use.     Current Outpatient Medications  Medication Sig Dispense Refill   fluticasone (FLONASE) 50 MCG/ACT nasal spray Place 2 sprays into both nostrils daily. 1 g 0   vitamin E 1000 UNIT capsule Take 1,000 Units by mouth daily.     Famotidine (PEPCID PO) Take by mouth. (Patient not taking: Reported on 10/04/2022)     nitrofurantoin, macrocrystal-monohydrate, (MACROBID) 100 MG capsule Take 1 capsule (100 mg total) by mouth every 12 (twelve) hours. (Patient not taking: Reported on 10/04/2022) 14 capsule 0   Vibegron (GEMTESA) 75 MG TABS Take 75 mg by mouth daily at 6 (six) AM. (Patient not taking: Reported on 10/04/2022) 90 tablet 3   No current facility-administered medications for this visit.      REVIEW OF SYSTEMS (Negative unless checked)  Constitutional: '[]'$ Weight loss  '[]'$ Fever  '[]'$ Chills Cardiac: '[]'$ Chest pain    '[]'$ Chest pressure   '[]'$ Palpitations   '[]'$ Shortness of breath when laying flat   '[]'$ Shortness of breath at rest   '[]'$ Shortness of breath with exertion. Vascular:  '[]'$ Pain in legs with walking   '[]'$ Pain in legs at rest   '[]'$ Pain in legs when laying flat   '[]'$ Claudication   '[]'$ Pain in feet when walking  '[]'$ Pain in feet at rest  '[]'$ Pain in feet when laying flat   '[x]'$ History of DVT   '[]'$ Phlebitis   '[x]'$ Swelling in legs   '[]'$ Varicose veins   '[]'$ Non-healing ulcers Pulmonary:   '[]'$ Uses home oxygen   '[]'$ Productive cough   '[]'$ Hemoptysis   '[]'$ Wheeze  '[]'$ COPD   '[]'$ Asthma Neurologic:  '[]'$ Dizziness  '[]'$ Blackouts   '[]'$ Seizures   '[]'$ History of stroke   '[]'$ History of TIA  '[]'$ Aphasia   '[]'$ Temporary blindness   '[]'$ Dysphagia   '[]'$ Weakness or numbness in arms   '[]'$ Weakness or numbness in legs Musculoskeletal:  '[x]'$ Arthritis   '[]'$ Joint swelling   '[]'$ Joint pain   '[]'$ Low back pain Hematologic:  '[]'$ Easy bruising  '[]'$ Easy bleeding   '[]'$ Hypercoagulable state   '[]'$ Anemic  '[]'$ Hepatitis Gastrointestinal:  '[]'$ Blood in stool   '[]'$ Vomiting blood  '[x]'$ Gastroesophageal reflux/heartburn   '[]'$ Abdominal pain Genitourinary:  '[]'$ Chronic kidney disease   '[]'$ Difficult urination  '[]'$ Frequent urination  '[]'$ Burning with urination   '[]'$ Hematuria Skin:  '[]'$ Rashes   '[]'$ Ulcers   '[]'$ Wounds Psychological:  '[]'$ History of anxiety   '[]'$  History of major depression.    Physical Exam BP (!) 146/72 (BP Location: Right Arm)   Pulse 72   Resp 16   Wt 157 lb 6.4 oz (71.4 kg)   LMP 02/07/1994   BMI 24.65 kg/m  Gen:  WD/WN, NAD Head: [place]/AT, No temporalis wasting.  Ear/Nose/Throat: Hearing grossly intact, nares w/o erythema or drainage, oropharynx w/o Erythema/Exudate Eyes: Conjunctiva clear, sclera non-icteric  Neck: trachea midline.  No JVD.  Pulmonary:  Good air movement, respirations not labored, no use of accessory muscles  Cardiac: RRR, no JVD Vascular:  Vessel Right Left  Radial Palpable Palpable                          DP 1+ 2+  PT 1+ 2+   Gastrointestinal:. No masses, surgical  incisions, or scars. Musculoskeletal: M/S 5/5 throughout.  Extremities without ischemic changes.  No deformity or atrophy. 1+ RLE edema. Neurologic: Sensation grossly intact in extremities.  Symmetrical.  Speech is fluent. Motor exam as listed above. Psychiatric: Judgment intact, Mood & affect appropriate for pt's clinical situation. Dermatologic: No rashes or ulcers noted.  No cellulitis or open wounds.    Radiology No  results found.  Labs Recent Results (from the past 2160 hour(s))  CBC with Differential/Platelet     Status: None   Collection Time: 08/01/22  9:43 AM  Result Value Ref Range   WBC 4.1 4.0 - 10.5 K/uL   RBC 4.20 3.87 - 5.11 Mil/uL   Hemoglobin 12.8 12.0 - 15.0 g/dL   HCT 38.3 36.0 - 46.0 %   MCV 91.1 78.0 - 100.0 fl   MCHC 33.4 30.0 - 36.0 g/dL   RDW 13.2 11.5 - 15.5 %   Platelets 299.0 150.0 - 400.0 K/uL   Neutrophils Relative % 54.9 43.0 - 77.0 %   Lymphocytes Relative 29.9 12.0 - 46.0 %   Monocytes Relative 10.9 3.0 - 12.0 %   Eosinophils Relative 2.6 0.0 - 5.0 %   Basophils Relative 1.7 0.0 - 3.0 %   Neutro Abs 2.3 1.4 - 7.7 K/uL   Lymphs Abs 1.2 0.7 - 4.0 K/uL   Monocytes Absolute 0.4 0.1 - 1.0 K/uL   Eosinophils Absolute 0.1 0.0 - 0.7 K/uL   Basophils Absolute 0.1 0.0 - 0.1 K/uL    Assessment/Plan:  Lymphedema Patient has chronic swelling of the right leg which is likely a result of postphlebitic changes in addition to lymphedema.  She had worsening with surgery which is typical of lymphedema.  She is to keep wearing her compression socks on a daily basis and elevating her legs.  She needs to remain active.  We will obtain a venous reflux study in the near future at her convenience to assess her venous system.  Pending the results of that, a lymphedema pump may be of benefit for her.  Chronic venous insufficiency History of thrombotic issues of the right leg and likely some postphlebitic component.  Venous reflux at her convenience.      Leotis Pain 10/04/2022, 2:03 PM   This note was created with Dragon medical transcription system.  Any errors from dictation are unintentional.

## 2022-10-04 NOTE — Assessment & Plan Note (Signed)
Patient has chronic swelling of the right leg which is likely a result of postphlebitic changes in addition to lymphedema.  She had worsening with surgery which is typical of lymphedema.  She is to keep wearing her compression socks on a daily basis and elevating her legs.  She needs to remain active.  We will obtain a venous reflux study in the near future at her convenience to assess her venous system.  Pending the results of that, a lymphedema pump may be of benefit for her.

## 2022-10-05 ENCOUNTER — Ambulatory Visit (INDEPENDENT_AMBULATORY_CARE_PROVIDER_SITE_OTHER): Payer: BC Managed Care – PPO

## 2022-10-05 ENCOUNTER — Encounter: Payer: Self-pay | Admitting: Internal Medicine

## 2022-10-05 DIAGNOSIS — I890 Lymphedema, not elsewhere classified: Secondary | ICD-10-CM | POA: Diagnosis not present

## 2022-10-05 DIAGNOSIS — R600 Localized edema: Secondary | ICD-10-CM | POA: Insufficient documentation

## 2022-10-05 DIAGNOSIS — I872 Venous insufficiency (chronic) (peripheral): Secondary | ICD-10-CM | POA: Diagnosis not present

## 2022-10-10 ENCOUNTER — Encounter (INDEPENDENT_AMBULATORY_CARE_PROVIDER_SITE_OTHER): Payer: Self-pay

## 2022-10-11 ENCOUNTER — Ambulatory Visit (INDEPENDENT_AMBULATORY_CARE_PROVIDER_SITE_OTHER): Payer: BC Managed Care – PPO | Admitting: Nurse Practitioner

## 2022-10-11 ENCOUNTER — Encounter (INDEPENDENT_AMBULATORY_CARE_PROVIDER_SITE_OTHER): Payer: Self-pay | Admitting: Nurse Practitioner

## 2022-10-11 VITALS — BP 156/76 | HR 78 | Resp 16 | Wt 158.4 lb

## 2022-10-11 DIAGNOSIS — I83811 Varicose veins of right lower extremities with pain: Secondary | ICD-10-CM | POA: Diagnosis not present

## 2022-10-12 ENCOUNTER — Other Ambulatory Visit: Payer: BC Managed Care – PPO

## 2022-10-12 ENCOUNTER — Ambulatory Visit
Admission: RE | Admit: 2022-10-12 | Discharge: 2022-10-12 | Disposition: A | Discharge status: Home / Self Care | Payer: BC Managed Care – PPO | Source: Ambulatory Visit | Attending: Oncology | Admitting: Oncology

## 2022-10-12 DIAGNOSIS — Z853 Personal history of malignant neoplasm of breast: Secondary | ICD-10-CM | POA: Insufficient documentation

## 2022-10-12 DIAGNOSIS — Z1231 Encounter for screening mammogram for malignant neoplasm of breast: Secondary | ICD-10-CM | POA: Diagnosis not present

## 2022-10-12 DIAGNOSIS — R92333 Mammographic heterogeneous density, bilateral breasts: Secondary | ICD-10-CM | POA: Diagnosis not present

## 2022-10-12 HISTORY — DX: Personal history of irradiation: Z92.3

## 2022-10-14 ENCOUNTER — Inpatient Hospital Stay (HOSPITAL_BASED_OUTPATIENT_CLINIC_OR_DEPARTMENT_OTHER): Payer: BC Managed Care – PPO | Admitting: Oncology

## 2022-10-14 ENCOUNTER — Inpatient Hospital Stay: Payer: BC Managed Care – PPO | Attending: Oncology

## 2022-10-14 ENCOUNTER — Encounter: Payer: Self-pay | Admitting: Oncology

## 2022-10-14 VITALS — BP 130/68 | HR 59 | Temp 98.0°F | Resp 18 | Wt 157.5 lb

## 2022-10-14 DIAGNOSIS — M8580 Other specified disorders of bone density and structure, unspecified site: Secondary | ICD-10-CM | POA: Insufficient documentation

## 2022-10-14 DIAGNOSIS — D0512 Intraductal carcinoma in situ of left breast: Secondary | ICD-10-CM | POA: Insufficient documentation

## 2022-10-14 DIAGNOSIS — Z79811 Long term (current) use of aromatase inhibitors: Secondary | ICD-10-CM | POA: Insufficient documentation

## 2022-10-14 LAB — CBC WITH DIFFERENTIAL/PLATELET
Abs Immature Granulocytes: 0.02 10*3/uL (ref 0.00–0.07)
Basophils Absolute: 0.1 10*3/uL (ref 0.0–0.1)
Basophils Relative: 1 %
Eosinophils Absolute: 0.1 10*3/uL (ref 0.0–0.5)
Eosinophils Relative: 3 %
HCT: 38.9 % (ref 36.0–46.0)
Hemoglobin: 12.5 g/dL (ref 12.0–15.0)
Immature Granulocytes: 0 %
Lymphocytes Relative: 29 %
Lymphs Abs: 1.3 10*3/uL (ref 0.7–4.0)
MCH: 29.7 pg (ref 26.0–34.0)
MCHC: 32.1 g/dL (ref 30.0–36.0)
MCV: 92.4 fL (ref 80.0–100.0)
Monocytes Absolute: 0.5 10*3/uL (ref 0.1–1.0)
Monocytes Relative: 10 %
Neutro Abs: 2.6 10*3/uL (ref 1.7–7.7)
Neutrophils Relative %: 57 %
Platelets: 242 10*3/uL (ref 150–400)
RBC: 4.21 MIL/uL (ref 3.87–5.11)
RDW: 12.9 % (ref 11.5–15.5)
WBC: 4.5 10*3/uL (ref 4.0–10.5)
nRBC: 0 % (ref 0.0–0.2)

## 2022-10-14 LAB — COMPREHENSIVE METABOLIC PANEL
ALT: 18 U/L (ref 0–44)
AST: 17 U/L (ref 15–41)
Albumin: 3.8 g/dL (ref 3.5–5.0)
Alkaline Phosphatase: 89 U/L (ref 38–126)
Anion gap: 5 (ref 5–15)
BUN: 15 mg/dL (ref 6–20)
CO2: 28 mmol/L (ref 22–32)
Calcium: 9.1 mg/dL (ref 8.9–10.3)
Chloride: 108 mmol/L (ref 98–111)
Creatinine, Ser: 0.73 mg/dL (ref 0.44–1.00)
GFR, Estimated: >60 mL/min (ref >60)
Glucose, Bld: 94 mg/dL (ref 70–99)
Potassium: 4.1 mmol/L (ref 3.5–5.1)
Sodium: 141 mmol/L (ref 135–145)
Total Bilirubin: 0.7 mg/dL (ref 0.3–1.2)
Total Protein: 7.1 g/dL (ref 6.5–8.1)

## 2022-10-14 NOTE — Assessment & Plan Note (Signed)
#  Osteopenia, bone density was on 04/23/2020. Continue calcium and vitamin D Patient is not currently on any adjuvant endocrine therapy.  I will defer to her primary care provider for treatment.

## 2022-10-15 NOTE — Progress Notes (Signed)
Hematology/Oncology Progress note [phone] [phone]      ASSESSMENT & PLAN:   Osteopenia #Osteopenia, bone density was on 04/23/2020. Continue calcium and vitamin D supplementation.  Not on any adjuvant endocrine therapy.   I defer to her primary care provider for decision of bisphosphonate treatment and surveillance.  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 Patient is not able to tolerate aromatase inhibitor.  Currently off endocrine therapy. Continue surveillance with annual mammogram. Recent bilateral mammogram results were reviewed with patient.  Continue annual mammogram screening.    Orders Placed This Encounter  Procedures   MM 3D SCREEN BREAST BILATERAL    Standing Status:   Future    Standing Expiration Date:   10/15/2023    Order Specific Question:   Reason for Exam (SYMPTOM  OR DIAGNOSIS REQUIRED)    Answer:   history of DCIS, no new breast concerns    Order Specific Question:   Preferred imaging location?    Answer:   [facility]    Order Specific Question:   Is the patient pregnant?    Answer:   No   CBC with Differential/Platelet    Standing Status:   Future    Standing Expiration Date:   10/15/2023   Comprehensive metabolic panel    Standing Status:   Future    Standing Expiration Date:   10/15/2023   Follow up  1 year bilateral screening mammogram  Prior to lab MD cbc cmp + breast exam   All questions were answered. The patient knows to call the clinic with any problems, questions or concerns.  Earlie Server, MD, PhD [facility] Hematology Oncology 10/14/2022   CHIEF COMPLAINTS/REASON FOR VISIT:  Follow up for DCIS  HISTORY OF PRESENTING ILLNESS:   Rose Bernard is a  56 y.o.  female with PMH listed below was seen in consultation at the request of  Einar Pheasant, MD  for evaluation of DCIS. 10/15/2019 screening bilateral mammogram showed possible  left breast mass and calcification. 10/25/2019 patient had unilateral diagnostic mammogram of left breast which showed indeterminate mass in the left breast at 4:00.  Patient underwent ultrasound-guided biopsy of the left breast mass 12/12/2019 left breast ultrasound-guided biopsy showed atypical ductal hyperplastic. Patient was evaluated by Dr. Peyton Najjar and had left breast mass excisional biopsy on 01/10/2020. Pathology showed DCIS, nuclear grade 1-2, background columnar cell changes, papillary change, fibrocystic changes.  Size of the DCIS 21 mm, margins positive for DCIS in the anterior and inferior multifocal.  Patient was referred to cancer center for discussion of further management. Today she denies any new complaints.  Some soreness at the site of excisional biopsy. Menarche age 42. Patient had a hysterectomy at age of 56. She takes estradiol for hot flash symptoms for about a year half. Family history positive for maternal aunt and paternal aunt were both diagnosed with breast cancer, her father was diagnosed with colon cancer.  She had remote breast biopsy in 1991.    01/10/20 Initial lumpectomy showed positive DCIS margin. 01/31/20 Patient underwent reexcision and achieved a negative margin. Patient finished adjuvant radiation on 03/30/2020.  # Patient started on Arimidex around 04/18/2020 and after taking 2 days, she noticed left ankle swelling for about a week.  She presented to the emergency room on 04/25/2020 for evaluation.There was no DVT., however there does appear to be a superficial thrombophlebitis involving medial superficial veins overlying the ankle.  Patient was advised to try NSAIDs and compression stocking.   Family history of cancer, declined genetic testing. Family history of thrombosis and personal history of superficial thrombophlebitis.  She had hypercoagulable work-up done including negative antiphospholipid antibody profile, negative factor V Leiden gene mutation,  negative prothrombin gene mutation.   Patient was last seen by me on 05/12/2020.  At that time patient has developed superficial thrombophlebitis. patient then switched care to [facility] for second opinion.  Patient was initially recommended to resume Arimidex.  Patient developed a headache and a bone pain and was switched to Exemestane.  Per care everywhere, no additional documented records of another visit with hematology oncology there.  Per patient, she was not able to tolerate Exemestane as well and eventually she was recommended by [facility] to stay off medication.  Patient was seen by primary care provider Dr. Nicki Reaper and bilateral diagnostic mammogram was obtained on 10/11/2021. Stable left lumpectomy site.  No evidence of malignancy.  09/15/2021, patient was diagnosed of plantar fasciitis.  Patient was recommended to wear plantar fascial brace.  10/15/2021 Re- establish care with me for follow-up of DCIS.   INTERVAL HISTORY Rose Bernard is a 56 y.o. female who has above history reviewed by me today presents to reestablish care for history of DCIS Patient did not tolerate aromatase inhibitor, off endocrine therapy.  She has no new breast concern today.   Review of Systems  Constitutional:  Negative for appetite change, chills, fatigue and fever.  HENT:   Negative for hearing loss and voice change.   Eyes:  Negative for eye problems.  Respiratory:  Negative for chest tightness and cough.   Cardiovascular:  Negative for chest pain.  Gastrointestinal:  Negative for abdominal distention, abdominal pain and blood in stool.  Endocrine: Negative for hot flashes.  Genitourinary:  Negative for difficulty urinating and frequency.   Musculoskeletal:  Negative for arthralgias.  Skin:  Negative for itching and rash.  Neurological:  Negative for extremity weakness.  Hematological:  Negative for adenopathy.  Psychiatric/Behavioral:  Negative for confusion.     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 Hysterectomy and bladder repair with subsequent appendectomy. L Ovary remain in place    APPENDECTOMY     2001 due to scar tissue   BLADDER BOTOX  2014, 2015, 2016   BREAST BIOPSY Bilateral 1991   BREAST BIOPSY Left 12/12/2019   [place] bx, path pending   BREAST LUMPECTOMY     BREAST SURGERY Bilateral 2002   benign mass removed   COLONOSCOPY  2014   EXCISION OF BREAST BIOPSY Left 01/10/2020   Procedure: EXCISION OF BREAST BIOPSY w/ Needle localization;  Surgeon: Herbert Pun, MD;  Location: [facility] ORS;  Service: General;  Laterality: Left;   FOOT SURGERY Bilateral    [place], PARTIAL Left 01/31/2020   Procedure: MASTECTOMY PARTIAL;  Surgeon: Herbert Pun, MD;  Location: [facility] ORS;  Service: General;  Laterality: Left;   reconstruction of bladder   1995   2/2  delivery complications with 3rd child     SOCIAL HISTORY: Social History   Socioeconomic History   Marital status: Married    Spouse name: Not on file   Number of children: 2   Years of education: Not on file   Highest education level: Not on file  Occupational History   Not on file  Tobacco Use   Smoking status: Never   Smokeless tobacco: Never  Vaping Use   Vaping Use: Never used  Substance and Sexual Activity   Alcohol use: No    Alcohol/week: 0.0 standard drinks of alcohol   Drug use: No   Sexual activity: Yes    Birth control/protection: Surgical  Other Topics Concern   Not on file  Social History Narrative   Not on file    Social Determinants of Health   Financial Resource Strain: Not on file  Food Insecurity: Not on file  Transportation Needs: Not on file  Physical Activity: Not on file  Stress: Not on file  Social Connections: Not on file  Intimate Partner Violence: Not on file    FAMILY HISTORY: Family History  Problem Relation Age of Onset   Colon cancer Father        With ostomy in place    Breast cancer Paternal Aunt 66   Breast cancer Maternal Aunt    Breast cancer Maternal Aunt     ALLERGIES:  is allergic to penicillin v potassium.  MEDICATIONS:  Current Outpatient Medications  Medication Sig Dispense Refill   vitamin E 1000 UNIT capsule Take 1,000 Units by mouth daily.     Famotidine (PEPCID PO) Take by mouth. (Patient not taking: Reported on 10/04/2022)     fluticasone (FLONASE) 50 MCG/ACT nasal spray Place 2 sprays into both nostrils daily. (Patient not taking: Reported on 10/14/2022) 1 g 0   Vibegron (GEMTESA) 75 MG TABS Take 75 mg by mouth daily at 6 (six) AM. (Patient not taking: Reported on 10/04/2022) 90 tablet 3   No current facility-administered medications for this visit.     PHYSICAL EXAMINATION: ECOG PERFORMANCE STATUS: 1 - Symptomatic but completely ambulatory Vitals:   10/14/22 1127  BP: 130/68  Pulse: (!) 59  Resp: 18  Temp: 98 F (36.7 C)   Filed Weights   10/14/22 1127  Weight: 157 lb 8 oz (71.4 kg)    Physical Exam Constitutional:      General: She is not in acute distress. HENT:     Head: Normocephalic and atraumatic.  Eyes:     General: No scleral icterus. Cardiovascular:     Rate and Rhythm: Normal rate and regular rhythm.     Heart sounds: Normal heart sounds.  Pulmonary:     Effort: Pulmonary effort is normal. No respiratory distress.     Breath sounds: No wheezing.  Abdominal:     General: Bowel sounds are normal. There is no distension.     Palpations: Abdomen is soft.  Musculoskeletal:        General: No deformity. Normal range of  motion.     Cervical back: Normal range of motion and neck supple.  Skin:    General: Skin is warm and dry.     Findings: No erythema or rash.  Neurological:     Mental Status: She is alert and oriented to person, place, and time. Mental status is at baseline.     Cranial Nerves: No cranial nerve deficit.     Coordination: Coordination normal.  Psychiatric:  Mood and Affect: Mood normal.    Breast exam was performed in seated and lying down position. Left breast status post lumpectomy, tissue thickening at the lumpectomy sites, with mild tenderness.  Otherwise no palpable discrete masses bilaterally.  No palpable axillary lymphadenopathy bilaterally.   LABORATORY DATA:  I have reviewed the data as listed     Latest Ref Rng & Units 10/14/2022   11:12 AM 08/01/2022    9:43 AM 07/05/2022    8:29 AM  CBC  WBC 4.0 - 10.5 K/uL 4.5  4.1  3.7   Hemoglobin 12.0 - 15.0 g/dL 12.5  12.8  13.3   Hematocrit 36.0 - 46.0 % 38.9  38.3  39.7   Platelets 150 - 400 K/uL 242  299.0  269.0       Latest Ref Rng & Units 10/14/2022   11:12 AM 07/05/2022    8:29 AM 10/15/2021   11:19 AM  CMP  Glucose 70 - 99 mg/dL 94  81  107   BUN 6 - 20 mg/dL '15  16  20   '$ Creatinine 0.44 - 1.00 mg/dL 0.73  0.68  0.72   Sodium 135 - 145 mmol/L 141  141  139   Potassium 3.5 - 5.1 mmol/L 4.1  3.8  3.7   Chloride 98 - 111 mmol/L 108  104  103   CO2 22 - 32 mmol/L '28  30  28   '$ Calcium 8.9 - 10.3 mg/dL 9.1  9.0  8.5   Total Protein 6.5 - 8.1 g/dL 7.1  6.9  7.3   Total Bilirubin 0.3 - 1.2 mg/dL 0.7  0.5  0.5   Alkaline Phos 38 - 126 U/L 89  95  82   AST 15 - 41 U/L '17  14  16   '$ ALT 0 - 44 U/L '18  14  15      '$ RADIOGRAPHIC STUDIES: I have personally reviewed the radiological images as listed and agreed with the findings in the report.  MM DIAG BREAST TOMO BILATERAL  Result Date: 10/12/2022 CLINICAL DATA:  Status post left lumpectomy in February 2021. EXAM: DIGITAL DIAGNOSTIC BILATERAL MAMMOGRAM WITH  TOMOSYNTHESIS TECHNIQUE: Bilateral digital diagnostic mammography and breast tomosynthesis was performed. COMPARISON:  Previous exam(s). ACR Breast Density Category c: The breast tissue is heterogeneously dense, which may obscure small masses. FINDINGS: Stable postsurgical changes in the left breast. No new suspicious mass, calcification, or other findings in bilateral breasts. IMPRESSION: No evidence of malignancy. RECOMMENDATION: Per protocol, as the patient is now 2 or more years status post lumpectomy, she may return to annual screening mammography in 1 year. However, given the history of breast cancer, the patient remains eligible for annual diagnostic mammography if preferred. I have discussed the findings and recommendations with the patient. If applicable, a reminder letter will be sent to the patient regarding the next appointment. BI-RADS CATEGORY  2: Benign. Electronically Signed   By: Beryle Flock M.D.   On: 10/12/2022 11:07  VAS [place] LOWER EXTREMITY VENOUS REFLUX  Result Date: 10/10/2022  Lower Venous Reflux Study Patient Name:  LAKIAH DHINGRA  Date of Exam:   10/05/2022 Medical Rec #: [id number]          Accession #:    [id number] Date of Birth: [birth year]           Patient Gender: F Patient Age:   20 years Exam Location:  [facility] Procedure:      VAS [place] LOWER EXTREMITY VENOUS  REFLUX Referring Phys: Corene Cornea DEW --------------------------------------------------------------------------------  Indications: Swelling, Edema, and Pain.  Risk Factors: Surgery Recent right ankle surgery. Performing Technologist: Delorise Shiner RVT  Examination Guidelines: A complete evaluation includes B-mode imaging, spectral Doppler, color Doppler, and power Doppler as needed of all accessible portions of each vessel. Bilateral testing is considered an integral part of a complete examination. Limited examinations for reoccurring indications may be performed as noted. The reflux portion of the exam is  performed with the patient in reverse Trendelenburg. Significant venous reflux is defined as >500 ms in the superficial venous system, and >1 second in the deep venous system.  Venous Reflux Times +--------------+---------+------+-----------+------------+--------+ RIGHT         Reflux NoRefluxReflux TimeDiameter cmsComments                         Yes                                  +--------------+---------+------+-----------+------------+--------+ CFV           no                                             +--------------+---------+------+-----------+------------+--------+ FV prox       no                                             +--------------+---------+------+-----------+------------+--------+ FV mid        no                                             +--------------+---------+------+-----------+------------+--------+ FV dist       no                                             +--------------+---------+------+-----------+------------+--------+ Popliteal     no                                             +--------------+---------+------+-----------+------------+--------+ GSV at [facility]    no                            0.69             +--------------+---------+------+-----------+------------+--------+ GSV prox thighno                            0.41             +--------------+---------+------+-----------+------------+--------+ GSV mid thigh no                            0.40             +--------------+---------+------+-----------+------------+--------+ GSV dist thighno  0.36             +--------------+---------+------+-----------+------------+--------+ GSV at knee   no                            0.32             +--------------+---------+------+-----------+------------+--------+ GSV prox calf no                            0.39              +--------------+---------+------+-----------+------------+--------+ SSV Pop Fossa no                            0.32             +--------------+---------+------+-----------+------------+--------+ SSV prox calf no                            0.22             +--------------+---------+------+-----------+------------+--------+ SSV mid calf            yes    >500 ms      0.22             +--------------+---------+------+-----------+------------+--------+   Summary: Right: - No evidence of deep vein thrombosis seen in the right lower extremity, from the common femoral through the popliteal veins. - No evidence of superficial venous thrombosis in the right lower extremity. - No evidence of superficial venous reflux seen in the right greater saphenous vein. - Venous reflux is noted in the right short saphenous vein.  *See table(s) above for measurements and observations. Electronically signed by Hortencia Pilar MD on 10/10/2022 at 5:21:45 PM.    Final

## 2022-10-15 NOTE — Assessment & Plan Note (Signed)
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 Patient is not able to tolerate aromatase inhibitor.  Currently off endocrine therapy. Continue surveillance with annual mammogram. Recent bilateral mammogram results were reviewed with patient.  Continue annual mammogram screening.

## 2022-10-17 ENCOUNTER — Encounter (INDEPENDENT_AMBULATORY_CARE_PROVIDER_SITE_OTHER): Payer: Self-pay | Admitting: Nurse Practitioner

## 2022-10-17 NOTE — Progress Notes (Incomplete)
Subjective:    Patient ID: ANAJULIA LEYENDECKER, female    DOB: [birth year], 56 y.o.   MRN: [id number] Chief Complaint  Patient presents with  . Follow-up    Ultrasound results    HPI  Review of Systems     Objective:   Physical Exam  BP (!) 156/76 (BP Location: Right Arm)   Pulse 78   Resp 16   Wt 158 lb 6.4 oz (71.8 kg)   LMP 02/07/1994   BMI 24.81 kg/m   Past Medical History:  Diagnosis Date  . Chronic cystitis   . Frequent UTI   . GERD (gastroesophageal reflux disease)   . History of kidney stones   . Incomplete bladder emptying   . Personal history of radiation therapy   . Status post breast lumpectomy 11/11/2020  . 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 Marital status: Married    Spouse name: Not on file  . Number of children: 2  . Years of education: Not on file  . Highest education level: Not on file  Occupational History  . Not on file  Tobacco Use  . Smoking status: Never  . Smokeless tobacco: Never  Vaping Use  . Vaping Use: Never used  Substance and Sexual Activity  . Alcohol use: No    Alcohol/week: 0.0 standard drinks of alcohol  . Drug use: No  . Sexual activity: Yes    Birth control/protection: Surgical  Other Topics Concern  . Not on file  Social History Narrative  . Not on file   Social Determinants of Health   Financial Resource Strain: Not on file  Food Insecurity: Not on file  Transportation Needs: Not on file  Physical Activity: Not on file  Stress: Not on file  Social Connections: Not on file  Intimate Partner Violence: Not on file    Past Surgical History:  Procedure Laterality Date  . ABDOMINAL HYSTERECTOMY  1995   2/2 delivery complications with 3rd child. Hysterectomy and bladder repair with subsequent appendectomy. L Ovary remain in place   . APPENDECTOMY     2001 due to scar tissue  . BLADDER BOTOX  2014, 2015, 2016  . BREAST BIOPSY Bilateral 1991  .  BREAST BIOPSY Left 12/12/2019   [place] bx, path pending  . BREAST LUMPECTOMY    . BREAST SURGERY Bilateral 2002   benign mass removed  . COLONOSCOPY  2014  . EXCISION OF BREAST BIOPSY Left 01/10/2020   Procedure: EXCISION OF BREAST BIOPSY w/ Needle localization;  Surgeon: Herbert Pun, MD;  Location: [facility] ORS;  Service: General;  Laterality: Left;  . FOOT SURGERY Bilateral   . LAPAROSCOPIC HYSTERECTOMY  1995  . MASTECTOMY, PARTIAL Left 01/31/2020   Procedure: MASTECTOMY PARTIAL;  Surgeon: Herbert Pun, MD;  Location: [facility] ORS;  Service: General;  Laterality: Left;  . reconstruction of bladder   1995   2/2 delivery complications with 3rd child     Family History  Problem Relation Age of Onset  . Colon cancer Father        With ostomy in place   . Breast cancer Paternal Aunt 28  . Breast cancer Maternal Aunt   . Breast cancer Maternal Aunt     Allergies  Allergen Reactions  . Penicillin V Potassium Rash    Did it involve swelling of the face/tongue/throat, SOB, or low BP? No Did it involve sudden or severe rash/hives, skin peeling, or any reaction  on the inside of your mouth or nose? No Did you need to seek medical attention at a hospital or doctor's office? No When did it last happen?      25 years ago If all above answers are "NO", may proceed with cephalosporin use.        Latest Ref Rng & Units 10/14/2022   11:12 AM 08/01/2022    9:43 AM 07/05/2022    8:29 AM  CBC  WBC 4.0 - 10.5 K/uL 4.5  4.1  3.7   Hemoglobin 12.0 - 15.0 g/dL 12.5  12.8  13.3   Hematocrit 36.0 - 46.0 % 38.9  38.3  39.7   Platelets 150 - 400 K/uL 242  299.0  269.0       CMP     Component Value Date/Time   NA 141 10/14/2022 1112   K 4.1 10/14/2022 1112   CL 108 10/14/2022 1112   CO2 28 10/14/2022 1112   GLUCOSE 94 10/14/2022 1112   BUN 15 10/14/2022 1112   BUN 15 09/02/2015 0000   CREATININE 0.73 10/14/2022 1112   CALCIUM 9.1 10/14/2022 1112   PROT 7.1 10/14/2022 1112    ALBUMIN 3.8 10/14/2022 1112   AST 17 10/14/2022 1112   ALT 18 10/14/2022 1112   ALKPHOS 89 10/14/2022 1112   BILITOT 0.7 10/14/2022 1112   GFRNONAA >60 10/14/2022 1112   GFRAA >60 04/25/2020 1909     No results found.     Assessment & Plan:   1. Varicose veins of right lower extremity with pain Recommend  I have reviewed my previous  discussion with the patient regarding  varicose veins and why they cause symptoms. Patient will continue  wearing graduated compression stockings class 1 on a daily basis, beginning first thing in the morning and removing them in the evening.  The patient is CEAP [id number]   In addition, behavioral modification including elevation during the day was again discussed and this will continue.  The patient has utilized over the counter pain medications and has been exercising.  However, at this time conservative therapy has not alleviated the patient's symptoms of leg pain and swelling  Recommend: laser ablation of the right small saphenous veins to eliminate the symptoms of pain and swelling of the lower extremities caused by the severe superficial venous reflux disease.    Current Outpatient Medications on File Prior to Visit  Medication Sig Dispense Refill  . fluticasone (FLONASE) 50 MCG/ACT nasal spray Place 2 sprays into both nostrils daily. (Patient not taking: Reported on 10/14/2022) 1 g 0  . Famotidine (PEPCID PO) Take by mouth. (Patient not taking: Reported on 10/04/2022)    . Vibegron (GEMTESA) 75 MG TABS Take 75 mg by mouth daily at 6 (six) AM. (Patient not taking: Reported on 10/04/2022) 90 tablet 3  . vitamin E 1000 UNIT capsule Take 1,000 Units by mouth daily.     No current facility-administered medications on file prior to visit.    There are no Patient Instructions on file for this visit. No follow-ups on file.   Kris Hartmann, NP

## 2022-10-17 NOTE — Progress Notes (Signed)
Subjective:    Patient ID: Rose Bernard, female    DOB: 23-Jan-1966, 56 y.o.   MRN: [id number] Chief Complaint  Patient presents with   Follow-up    Ultrasound results    Rose Bernard is a 56 y.o. female.  I am asked to see the patient by Dr. Nicki Reaper for evaluation of right leg swelling.  The patient was previously diagnosed with lymphedema as well as with a previous history of DVT in the right leg several years ago.  Currently she denies any left leg symptoms.  She notes that the right calf is tender and painful.  The patient has been wearing compression socks for well over the last 6 months without any significant improvement in leg swelling or pain.  She currently denies any chest pain or shortness of breath.  She previously had 2 foot and ankle surgeries and the swelling notably became worse after these.  The pain is becoming significant and beginning to affect activities of daily living.  Today noninvasive study showed no evidence of DVT.  Evidence of right small saphenous vein reflux     Review of Systems  Cardiovascular:  Positive for leg swelling.  All other systems reviewed and are negative.      Objective:   Physical Exam Vitals reviewed.  HENT:     Head: Normocephalic.  Cardiovascular:     Rate and Rhythm: Normal rate.     Pulses: Normal pulses.  Pulmonary:     Effort: Pulmonary effort is normal.  Skin:    General: Skin is warm and dry.  Neurological:     Mental Status: She is alert and oriented to person, place, and time.  Psychiatric:        Mood and Affect: Mood normal.        Behavior: Behavior normal.        Thought Content: Thought content normal.        Judgment: Judgment normal.     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     Social History   Socioeconomic History   Marital status: Married    Spouse name: Not on file   Number of children: 2   Years of education: Not on file   Highest education level: Not on file  Occupational History   Not on file  Tobacco Use   Smoking status: Never   Smokeless tobacco: Never  Vaping Use   Vaping Use: Never used  Substance and Sexual Activity   Alcohol use: No    Alcohol/week: 0.0 standard drinks of alcohol   Drug use: No   Sexual activity: Yes    Birth control/protection: Surgical  Other Topics Concern   Not on file  Social History Narrative   Not on file   Social Determinants of Health   Financial Resource Strain: Not on file  Food Insecurity: Not on file  Transportation Needs: Not on file  Physical Activity: Not on file  Stress: Not on file  Social Connections: Not on file  Intimate Partner Violence: Not on file    Past Surgical History:  Procedure Laterality Date   [place]   2/2 delivery complications with 3rd child. Hysterectomy and bladder repair with subsequent appendectomy. L Ovary remain in place    APPENDECTOMY     2001 due to scar tissue   BLADDER BOTOX  2014, 2015, 2016   BREAST BIOPSY Bilateral 1991   BREAST BIOPSY Left 12/12/2019   [place] bx, path pending   BREAST LUMPECTOMY     BREAST SURGERY Bilateral 2002   benign mass removed   COLONOSCOPY  2014   EXCISION OF BREAST BIOPSY Left 01/10/2020   Procedure: EXCISION OF BREAST BIOPSY w/ Needle localization;  Surgeon: Herbert Pun, MD;  Location: [facility] ORS;  Service: General;  Laterality: Left;   FOOT SURGERY Bilateral    LAPAROSCOPIC HYSTERECTOMY  1995   MASTECTOMY, PARTIAL Left 01/31/2020   Procedure: MASTECTOMY PARTIAL;  Surgeon: Herbert Pun, MD;  Location: [facility]  ORS;  Service: General;  Laterality: Left;   reconstruction of bladder   1995   2/2 delivery complications with 3rd child     Family History  Problem Relation Age of Onset   Colon cancer Father        With ostomy in place    Breast cancer Paternal Aunt 58   Breast cancer Maternal Aunt    Breast cancer Maternal Aunt     Allergies  Allergen Reactions   Penicillin V Potassium Rash    Did it involve swelling of the face/tongue/throat, SOB, or low BP? No Did it involve sudden or severe rash/hives, skin peeling, or any reaction on the inside of your mouth or nose? No Did you need to seek medical attention at a hospital or doctor's office? No When did it last happen?      25 years ago If all above answers are "NO", may proceed with cephalosporin use.        Latest Ref Rng & Units 10/14/2022   11:12 AM 08/01/2022    9:43 AM 07/05/2022    8:29 AM  CBC  WBC 4.0 - 10.5 K/uL 4.5  4.1  3.7   Hemoglobin 12.0 - 15.0 g/dL 12.5  12.8  13.3   Hematocrit 36.0 - 46.0 % 38.9  38.3  39.7   Platelets 150 - 400 K/uL 242  299.0  269.0       CMP     Component Value Date/Time   NA 141 10/14/2022 1112   K 4.1 10/14/2022 1112   CL 108 10/14/2022 1112   CO2 28 10/14/2022 1112   GLUCOSE 94 10/14/2022 1112   BUN 15 10/14/2022 1112   BUN 15 09/02/2015 0000   CREATININE 0.73 10/14/2022 1112   CALCIUM 9.1 10/14/2022 1112   PROT 7.1 10/14/2022 1112   ALBUMIN 3.8 10/14/2022 1112   AST 17 10/14/2022 1112   ALT 18 10/14/2022 1112   ALKPHOS 89 10/14/2022 1112   BILITOT 0.7 10/14/2022 1112   GFRNONAA >60 10/14/2022 1112   GFRAA >60 04/25/2020 1909     No results found.     Assessment & Plan:   1. Varicose veins of right lower extremity with pain Recommend  I have reviewed my previous  discussion with the patient regarding  varicose veins and why they cause symptoms. Patient will continue  wearing graduated compression stockings class 1 on a daily basis, beginning first thing in the morning  and removing them in the  evening.  The patient is CEAP [id number]   In addition, behavioral modification including elevation during the day was again discussed and this will continue.  The patient has utilized over the counter pain medications and has been exercising.  However, at this time conservative therapy has not alleviated the patient's symptoms of leg pain and swelling  Recommend: laser ablation of the right small saphenous veins to eliminate the symptoms of pain and swelling of the lower extremities caused by the severe superficial venous reflux disease.    Current Outpatient Medications on File Prior to Visit  Medication Sig Dispense Refill   fluticasone (FLONASE) 50 MCG/ACT nasal spray Place 2 sprays into both nostrils daily. (Patient not taking: Reported on 10/14/2022) 1 g 0   Famotidine (PEPCID PO) Take by mouth. (Patient not taking: Reported on 10/04/2022)     Vibegron (GEMTESA) 75 MG TABS Take 75 mg by mouth daily at 6 (six) AM. (Patient not taking: Reported on 10/04/2022) 90 tablet 3   vitamin E 1000 UNIT capsule Take 1,000 Units by mouth daily.     No current facility-administered medications on file prior to visit.    There are no Patient Instructions on file for this visit. No follow-ups on file.   Kris Hartmann, NP

## 2022-10-18 ENCOUNTER — Encounter: Payer: Self-pay | Admitting: Internal Medicine

## 2022-10-18 DIAGNOSIS — I8390 Asymptomatic varicose veins of unspecified lower extremity: Secondary | ICD-10-CM | POA: Insufficient documentation

## 2022-10-21 ENCOUNTER — Telehealth (INDEPENDENT_AMBULATORY_CARE_PROVIDER_SITE_OTHER): Payer: Self-pay | Admitting: Vascular Surgery

## 2022-10-21 NOTE — Telephone Encounter (Signed)
Patient called to schedule her laser    Please call and advise

## 2022-10-24 ENCOUNTER — Telehealth (INDEPENDENT_AMBULATORY_CARE_PROVIDER_SITE_OTHER): Payer: Self-pay | Admitting: Vascular Surgery

## 2022-10-24 NOTE — Telephone Encounter (Signed)
LVM for pt advising that Dr. Lucky Cowboy is booked for the rest of the year (barring any cancellations) for laser procedures. I advised pt TCB and let me know if insurance is changing or not. Nothing further needed at this time.

## 2022-10-26 DIAGNOSIS — L821 Other seborrheic keratosis: Secondary | ICD-10-CM | POA: Diagnosis not present

## 2022-10-26 DIAGNOSIS — D225 Melanocytic nevi of trunk: Secondary | ICD-10-CM | POA: Diagnosis not present

## 2022-10-26 DIAGNOSIS — L578 Other skin changes due to chronic exposure to nonionizing radiation: Secondary | ICD-10-CM | POA: Diagnosis not present

## 2022-10-26 DIAGNOSIS — Z872 Personal history of diseases of the skin and subcutaneous tissue: Secondary | ICD-10-CM | POA: Diagnosis not present

## 2022-10-26 DIAGNOSIS — D485 Neoplasm of uncertain behavior of skin: Secondary | ICD-10-CM | POA: Diagnosis not present

## 2022-10-27 ENCOUNTER — Telehealth (INDEPENDENT_AMBULATORY_CARE_PROVIDER_SITE_OTHER): Payer: Self-pay | Admitting: Vascular Surgery

## 2022-10-27 NOTE — Telephone Encounter (Signed)
LVM for pt TCB to talk about laser procedure. I will need to talk about insurance for 2024 and prior auth.

## 2022-10-28 NOTE — Telephone Encounter (Signed)
Spoke with pt regarding the status of scheduling laser procedure. I advised that it will be 2024 before I can get her in, barring any cancellations or something is worked out with scheduling. I advised that if it is next year, I will have to look into prior auth after 1.1.24. Pt acknowledged and I advised that I will call her ASAP to schedule. Nothing further is needed at this time.

## 2022-11-17 ENCOUNTER — Ambulatory Visit (INDEPENDENT_AMBULATORY_CARE_PROVIDER_SITE_OTHER): Payer: BC Managed Care – PPO | Admitting: Internal Medicine

## 2022-11-17 ENCOUNTER — Encounter: Payer: Self-pay | Admitting: Internal Medicine

## 2022-11-17 ENCOUNTER — Encounter: Payer: BC Managed Care – PPO | Admitting: Internal Medicine

## 2022-11-17 VITALS — BP 110/68 | HR 65 | Temp 98.1°F | Resp 16 | Ht 67.0 in | Wt 155.2 lb

## 2022-11-17 DIAGNOSIS — I872 Venous insufficiency (chronic) (peripheral): Secondary | ICD-10-CM | POA: Diagnosis not present

## 2022-11-17 DIAGNOSIS — R600 Localized edema: Secondary | ICD-10-CM

## 2022-11-17 DIAGNOSIS — Z0000 Encounter for general adult medical examination without abnormal findings: Secondary | ICD-10-CM

## 2022-11-17 DIAGNOSIS — Z9889 Other specified postprocedural states: Secondary | ICD-10-CM

## 2022-11-17 DIAGNOSIS — F439 Reaction to severe stress, unspecified: Secondary | ICD-10-CM

## 2022-11-17 DIAGNOSIS — N3020 Other chronic cystitis without hematuria: Secondary | ICD-10-CM | POA: Diagnosis not present

## 2022-11-17 DIAGNOSIS — Z800 Family history of malignant neoplasm of digestive organs: Secondary | ICD-10-CM

## 2022-11-17 DIAGNOSIS — J3489 Other specified disorders of nose and nasal sinuses: Secondary | ICD-10-CM

## 2022-11-17 LAB — POCT URINALYSIS DIPSTICK
Bilirubin, UA: NEGATIVE
Blood, UA: NEGATIVE
Glucose, UA: NEGATIVE
Ketones, UA: NEGATIVE
Nitrite, UA: NEGATIVE
Protein, UA: POSITIVE — AB
Spec Grav, UA: 1.025 (ref 1.010–1.025)
Urobilinogen, UA: 0.2 E.U./dL
pH, UA: 6 (ref 5.0–8.0)

## 2022-11-17 MED ORDER — AZELASTINE HCL 137 MCG/SPRAY NA SOLN: 1.0000 | Freq: Two times a day (BID) | NASAL | 0 refills | Status: AC

## 2022-11-17 MED ORDER — DOXYCYCLINE HYCLATE 100 MG PO TABS: 100.0000 mg | ORAL_TABLET | Freq: Two times a day (BID) | ORAL | 0 refills | Status: DC

## 2022-11-17 MED ORDER — FLUTICASONE PROPIONATE 50 MCG/ACT NA SUSP: 2.0000 | Freq: Every day | NASAL | 1 refills | Status: AC

## 2022-11-17 NOTE — Progress Notes (Signed)
Patient ID: Rose Bernard, female   DOB: [birth year], 56 y.o.   MRN: [id number]   Subjective:    Patient ID: Rose Bernard, female    DOB: 22-Nov-1966, 56 y.o.   MRN: [id number]   Patient here for  Chief Complaint  Patient presents with   Annual Exam    CPE   .   HPI S/p foot/toe surgery.  - doing ok.  Saw [facility] - recommended compression hose.  Also reports for > one month - back pain.  Has noticed urine smells.  Not constant.  Thirsty.  Mouth dry.  Also has noticed increased sinus pressure and drainage.  Teeth hurts.  Taking mucinex.  ENT - flonase and astelin.  Helped when using.  No increased headache.  No chest pain or sob reported.  Eating.     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 Hysterectomy and bladder repair with subsequent appendectomy. L Ovary remain in place    APPENDECTOMY     2001 due to scar tissue   BLADDER BOTOX  2014, 2015, 2016   BREAST BIOPSY Bilateral 1991   BREAST BIOPSY Left 12/12/2019   [place] bx, path pending   BREAST LUMPECTOMY     BREAST SURGERY Bilateral 2002   benign mass removed   COLONOSCOPY  2014   EXCISION OF BREAST BIOPSY Left 01/10/2020   Procedure: EXCISION OF BREAST BIOPSY w/ Needle localization;  Surgeon: Herbert Pun, MD;  Location: [facility] OR[facility];  Service: General;  Laterality: Left;   FOOT SURGERY Bilateral    LAPAROSCOPIC HYSTERECTOMY  1995   MASTECTOMY, PARTIAL Left 01/31/2020   Procedure: MASTECTOMY PARTIAL;  Surgeon: Herbert Pun, MD;  Location: [facility] ORS;  Service: General;  Laterality: Left;   reconstruction of bladder    1995   2/2 delivery complications with 3rd child    Family History  Problem Relation Age of Onset   Colon cancer Father        With ostomy in place    Breast cancer Paternal Aunt 82   Breast cancer Maternal Aunt    Breast cancer Maternal Aunt    Social History   Socioeconomic History   Marital status: Married    Spouse name: Not on file   Number of children: 2   Years of education: Not on file   Highest education level: Not on file  Occupational History   Not on file  Tobacco Use   Smoking status: Never   Smokeless tobacco: Never  Vaping Use   Vaping Use: Never used  Substance and Sexual Activity   Alcohol use: No    Alcohol/week: 0.0 standard drinks of alcohol   Drug use: No   Sexual activity: Yes    Birth control/protection: Surgical  Other Topics Concern   Not on file  Social History Narrative   Not on file   Social Determinants of Health   Financial Resource Strain: Not on file  Food Insecurity: Not on file  Transportation Needs: Not on file  Physical Activity: Not on file  Stress: Not on file  Social Connections: Not on file     Review of Systems  Constitutional:  Negative for appetite change and unexpected weight change.  HENT:  Positive for congestion, postnasal drip and sinus pressure. Negative for sore throat.   Eyes:  Negative for pain and visual disturbance.  Respiratory:  Negative for cough, chest tightness and shortness of breath.   Cardiovascular:  Negative for chest pain and palpitations.       Chronic swelling - lymphedema  - saw [facility].   Gastrointestinal:  Negative for abdominal pain, diarrhea, nausea and vomiting.  Genitourinary:  Negative for difficulty urinating and dysuria.  Musculoskeletal:  Positive for back pain. Negative for joint swelling and myalgias.  Skin:  Negative for color change and rash.  Neurological:  Negative for dizziness and headaches.  Hematological:  Negative for adenopathy. Does not bruise/bleed easily.   Psychiatric/Behavioral:  Negative for agitation and dysphoric mood.        Objective:     BP 110/68 (BP Location: Left Arm, Patient Position: Sitting, Cuff Size: Small)   Pulse 65   Temp 98.1 F (36.7 C) (Temporal)   Resp 16   Ht '5\' 7"'$  (1.702 m)   Wt 155 lb 3.2 oz (70.4 kg)   LMP 02/07/1994   SpO2 99%   BMI 24.31 kg/m  Wt Readings from Last 3 Encounters:  11/17/22 155 lb 3.2 oz (70.4 kg)  10/14/22 157 lb 8 oz (71.4 kg)  10/11/22 158 lb 6.4 oz (71.8 kg)    Physical Exam Vitals reviewed.  Constitutional:      General: She is not in acute distress.    Appearance: Normal appearance. She is well-developed.  HENT:     Head: Normocephalic and atraumatic.     Right Ear: External ear normal.     Left Ear: External ear normal.  Eyes:     General: No scleral icterus.       Right eye: No discharge.        Left eye: No discharge.     Conjunctiva/sclera: Conjunctivae normal.  Neck:     Thyroid: No thyromegaly.  Cardiovascular:     Rate and Rhythm: Normal rate and regular rhythm.  Pulmonary:     Effort: No tachypnea, accessory muscle usage or respiratory distress.     Breath sounds: Normal breath sounds. No decreased breath sounds or wheezing.  Chest:  Breasts:    Right: No inverted nipple, mass, nipple discharge or tenderness (no axillary adenopathy).     Left: No inverted nipple, mass, nipple discharge or tenderness (no axilarry adenopathy).  Abdominal:     General: Bowel sounds are normal.     Palpations: Abdomen is soft.     Tenderness: There is no abdominal tenderness.  Musculoskeletal:        General: No swelling or tenderness.     Cervical back: Neck supple.  Lymphadenopathy:     Cervical: No cervical adenopathy.  Skin:    Findings: No erythema or rash.  Neurological:     Mental Status: She is alert and oriented to person, place, and time.  Psychiatric:        Mood and Affect: Mood normal.        Behavior: Behavior normal.      Outpatient Encounter  Medications as of 11/17/2022  Medication Sig   fluticasone (FLONASE) 50 MCG/ACT nasal spray Place 2 sprays into both nostrils daily.   vitamin E 1000 UNIT capsule Take 1,000 Units by mouth daily.   [DISCONTINUED] Azelastine HCl  137 MCG/SPRAY SOLN Place into both nostrils.   [DISCONTINUED] doxycycline (VIBRA-TABS) 100 MG tablet Take 1 tablet (100 mg total) by mouth 2 (two) times daily.   Azelastine HCl 137 MCG/SPRAY SOLN Place 1 spray into both nostrils 2 (two) times daily.   [DISCONTINUED] Famotidine (PEPCID PO) Take by mouth. (Patient not taking: Reported on 10/04/2022)   [DISCONTINUED] fluticasone (FLONASE) 50 MCG/ACT nasal spray Place 2 sprays into both nostrils daily. (Patient not taking: Reported on 10/14/2022)   [DISCONTINUED] Vibegron (GEMTESA) 75 MG TABS Take 75 mg by mouth daily at 6 (six) AM. (Patient not taking: Reported on 10/04/2022)   No facility-administered encounter medications on file as of 11/17/2022.     Lab Results  Component Value Date   WBC 4.5 10/14/2022   HGB 12.5 10/14/2022   HCT 38.9 10/14/2022   PLT 242 10/14/2022   GLUCOSE 94 10/14/2022   CHOL 230 (H) 07/05/2022   TRIG 96.0 07/05/2022   HDL 70.20 07/05/2022   LDLCALC 141 (H) 07/05/2022   ALT 18 10/14/2022   AST 17 10/14/2022   NA 141 10/14/2022   K 4.1 10/14/2022   CL 108 10/14/2022   CREATININE 0.73 10/14/2022   BUN 15 10/14/2022   CO2 28 10/14/2022   TSH 2.72 07/05/2022   HGBA1C 5.6 10/01/2015    MM DIAG BREAST TOMO BILATERAL  Result Date: 10/12/2022 CLINICAL DATA:  Status post left lumpectomy in February 2021. EXAM: DIGITAL DIAGNOSTIC BILATERAL MAMMOGRAM WITH TOMOSYNTHESIS TECHNIQUE: Bilateral digital diagnostic mammography and breast tomosynthesis was performed. COMPARISON:  Previous exam(s). ACR Breast Density Category c: The breast tissue is heterogeneously dense, which may obscure small masses. FINDINGS: Stable postsurgical changes in the left breast. No new suspicious mass, calcification, or  other findings in bilateral breasts. IMPRESSION: No evidence of malignancy. RECOMMENDATION: Per protocol, as the patient is now 2 or more years status post lumpectomy, she may return to annual screening mammography in 1 year. However, given the history of breast cancer, the patient remains eligible for annual diagnostic mammography if preferred. I have discussed the findings and recommendations with the patient. If applicable, a reminder letter will be sent to the patient regarding the next appointment. BI-RADS CATEGORY  2: Benign. Electronically Signed   By: Beryle Flock M.D.   On: 10/12/2022 11:07      Assessment & Plan:   Problem List Items Addressed This Visit     Chronic cystitis    Followed by urology.  Describes symptoms as outlined.  Will send for culture. Placed on doxycycline for sinus infection.  Follow.       Relevant Orders   POCT Urinalysis Dipstick (Completed)   Urine Microscopic Only (Completed)   Urine Culture (Completed)   Chronic venous insufficiency    History of thrombosis - right leg.  Saw [facility].  Recommended compression hose.        Family history of colon cancer    Colonoscopy 07/2018 - diverticulum and internal hemorrhoids.       Health care maintenance    Physical today 11/17/22.  Colonoscopy 07/2018.  Mammogram 10/12/22 - birads II.  PAP 07/18/19 - negative with negative HPV - (s/p hysterectomy)       History of foot surgery    Being followed by ortho.        Lower extremity edema    Dr Lucky Cowboy 10/04/22 - plan venous reflux study.  Compression hose.        Sinus pressure    Nasal congestion / sinus pressure.  Symptoms c/w sinus infection.  Continue saline nasal spray, flonase and astelin nasal spray.  Doxycycline.  Follow.       Stress    Overall appears to be handling things relatively well.  Follow.       Other Visit Diagnoses     Routine general medical examination at a health care facility    -  Primary        Einar Pheasant, MD

## 2022-11-18 LAB — URINALYSIS, MICROSCOPIC ONLY

## 2022-11-20 LAB — URINE CULTURE
MICRO NUMBER:: 14290187
SPECIMEN QUALITY:: ADEQUATE

## 2022-11-22 ENCOUNTER — Encounter: Payer: Self-pay | Admitting: Internal Medicine

## 2022-11-28 MED ORDER — NITROFURANTOIN MONOHYD MACRO 100 MG PO CAPS: 100.0000 mg | ORAL_CAPSULE | Freq: Two times a day (BID) | ORAL | 0 refills | Status: DC

## 2022-11-28 NOTE — Telephone Encounter (Signed)
Spoke to [place].  Sinus symptoms better.  Still with urinary symptoms.  Urine culture sensitive to macrobid.  Macrobid x 1 week sent in to [place].  Instructed on probiotics.

## 2022-11-29 NOTE — Telephone Encounter (Signed)
Notify Terri, I spoke to [employer] and she said she would be happy to see her.  All Rose Bernard has to do is call urology and they will schedule an appt with her.  Let me know if any problems.

## 2022-12-05 ENCOUNTER — Encounter: Payer: Self-pay | Admitting: Internal Medicine

## 2022-12-05 NOTE — Assessment & Plan Note (Signed)
Colonoscopy 07/2018 - diverticulum and internal hemorrhoids.

## 2022-12-05 NOTE — Assessment & Plan Note (Signed)
Nasal congestion / sinus pressure.  Symptoms c/w sinus infection.  Continue saline nasal spray, flonase and astelin nasal spray.  Doxycycline.  Follow.  

## 2022-12-05 NOTE — Assessment & Plan Note (Signed)
Being followed by ortho.

## 2022-12-05 NOTE — Assessment & Plan Note (Signed)
Overall appears to be handling things relatively well.  Follow.   

## 2022-12-05 NOTE — Assessment & Plan Note (Signed)
Dr Lucky Cowboy 10/04/22 - plan venous reflux study.  Compression hose.

## 2022-12-05 NOTE — Assessment & Plan Note (Signed)
History of thrombosis - right leg.  Saw [facility].  Recommended compression hose.   

## 2022-12-05 NOTE — Assessment & Plan Note (Signed)
Physical today 11/17/22.  Colonoscopy 07/2018.  Mammogram 10/12/22 - birads II.  PAP 07/18/19 - negative with negative HPV - (s/p hysterectomy)

## 2022-12-05 NOTE — Assessment & Plan Note (Signed)
Followed by urology.  Describes symptoms as outlined.  Will send for culture. Placed on doxycycline for sinus infection.  Follow.

## 2022-12-20 ENCOUNTER — Ambulatory Visit: Payer: BC Managed Care – PPO | Admitting: Physician Assistant

## 2022-12-29 ENCOUNTER — Encounter: Payer: Self-pay | Admitting: Physician Assistant

## 2022-12-29 ENCOUNTER — Ambulatory Visit (INDEPENDENT_AMBULATORY_CARE_PROVIDER_SITE_OTHER): Payer: BC Managed Care – PPO | Admitting: Physician Assistant

## 2022-12-29 VITALS — BP 109/68 | HR 71 | Ht 67.0 in | Wt 150.0 lb

## 2022-12-29 DIAGNOSIS — N390 Urinary tract infection, site not specified: Secondary | ICD-10-CM | POA: Diagnosis not present

## 2022-12-29 DIAGNOSIS — N3281 Overactive bladder: Secondary | ICD-10-CM

## 2022-12-29 DIAGNOSIS — R350 Frequency of micturition: Secondary | ICD-10-CM | POA: Diagnosis not present

## 2022-12-29 LAB — URINALYSIS, COMPLETE
Bilirubin, UA: NEGATIVE
Glucose, UA: NEGATIVE
Ketones, UA: NEGATIVE
Nitrite, UA: NEGATIVE
Protein,UA: NEGATIVE
RBC, UA: NEGATIVE
Specific Gravity, UA: >1.03 — ABNORMAL HIGH (ref 1.005–1.030)
Urobilinogen, Ur: 0.2 mg/dL (ref 0.2–1.0)
pH, UA: 6 (ref 5.0–7.5)

## 2022-12-29 LAB — MICROSCOPIC EXAMINATION

## 2022-12-29 MED ORDER — ESTRADIOL 0.1 MG/GM VA CREA: TOPICAL_CREAM | VAGINAL | 12 refills | Status: DC

## 2022-12-29 NOTE — Progress Notes (Signed)
12/29/2022 5:00 PM   Rose Bernard 12/20/1965 [id number]  CC: Chief Complaint  Patient presents with   Follow-up   Urinary Tract Infection   HPI: Rose Bernard is a 57 y.o. 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   Today she reports she saw her PCP on 11/17/2022 with reports of low back pain and malodorous urine as well as sinus pressure and drainage.  UA was notable for pyuria and bacteriuria and she was started on doxycycline to treat both UTI and sinusitis.  Her urine culture finalized with pansensitive E. coli.  Her urinary symptoms did not resolve, so she was prescribed Macrobid x 7 days.  Today she reports that she continues to have worsened urgency/leakage over baseline, lower abdominal pain, low back pain, and malodorous urine especially in the morning.  She describes some mild R>L flank pain as well.  She denies gross hematuria, fever, chills, nausea, or vomiting.  Overall she reports her urinary symptoms have improved on antibiotics, however they worsen again when she completes them.  She wonders why she is getting UTIs all of a sudden and wonders if this could be associated with her OAB.  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  She shows me today warning sign from her workplace which notifies employees of heavy electromagnetic fields associated with the machines that can interfere with cardiac pacemakers.  She wants to clarify if this sort of electromagnetic field would also interfere with an Inter[employer] device.  In-office UA today positive for trace leukocytes; urine microscopy with 11-30 WBCs/HPF and moderate bacteria.  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 Hysterectomy and bladder repair with subsequent appendectomy. L Ovary remain in place    APPENDECTOMY     2001 due to scar tissue   BLADDER BOTOX  2014, 2015, 2016   BREAST BIOPSY Bilateral 1991   BREAST BIOPSY Left 12/12/2019   [place] bx, path pending   BREAST LUMPECTOMY     BREAST SURGERY Bilateral 2002   benign mass removed   COLONOSCOPY  2014   EXCISION OF BREAST BIOPSY Left 01/10/2020   Procedure: EXCISION OF BREAST BIOPSY w/ Needle localization;  Surgeon: Herbert Pun, MD;  Location: [facility];  Service: General;  Laterality: Left;   FOOT SURGERY Bilateral    [place], PARTIAL Left 01/31/2020   Procedure: MASTECTOMY PARTIAL;  Surgeon: Herbert Pun, MD;  Location: [facility];  Service: General;  Laterality: Left;   reconstruction of bladder   1995   2/2 delivery complications with 3rd child     Home Medications:  Allergies as of 12/29/2022       Reactions   Penicillin V Potassium Rash   Did it involve swelling of the face/tongue/throat, SOB, or low BP? No Did it involve sudden or severe rash/hives, skin peeling, or any reaction  on the inside of your mouth or nose? No Did you need to seek medical attention at a hospital or doctor's office? No When did it last happen?      25 years ago If all above answers are "NO", may proceed with cephalosporin use.        Medication List        Accurate as of December 29, 2022  5:00 PM. If you have any questions, ask your nurse or doctor.          STOP taking these medications    nitrofurantoin (macrocrystal-monohydrate) 100 MG  capsule Commonly known as: Macrobid Stopped by: Debroah Loop, PA-C       TAKE these medications    Azelastine HCl 137 MCG/SPRAY Soln Place 1 spray into both nostrils 2 (two) times daily.   estradiol 0.1 MG/GM vaginal cream Commonly known as: ESTRACE Apply one pea-sized amount around the opening of the urethra daily for 2 weeks, then 3 times weekly moving forward. Started by: Debroah Loop, PA-C   fluticasone 50 MCG/ACT nasal spray Commonly known as: FLONASE Place 2 sprays into both nostrils daily.   vitamin E 1000 UNIT capsule Take 1,000 Units by mouth daily.        Allergies:  Allergies  Allergen Reactions   Penicillin V Potassium Rash    Did it involve swelling of the face/tongue/throat, SOB, or low BP? No Did it involve sudden or severe rash/hives, skin peeling, or any reaction on the inside of your mouth or nose? No Did you need to seek medical attention at a hospital or doctor's office? No When did it last happen?      25 years ago If all above answers are "NO", may proceed with cephalosporin use.     Family History: Family History  Problem Relation Age of Onset   Colon cancer Father        With ostomy in place    Breast cancer Paternal Aunt 24   Breast cancer Maternal Aunt    Breast cancer Maternal Aunt     Social History:   reports that she has never smoked. She has never been exposed to tobacco smoke. She has never used smokeless tobacco. She reports that she does not drink alcohol and does not use drugs.  Physical Exam: BP 109/68   Pulse 71   Ht '5\' 7"'$  (1.702 m)   Wt 150 lb (68 kg)   LMP 02/07/1994   BMI 23.49 kg/m   Constitutional:  Alert and oriented, no acute distress, nontoxic appearing HEENT: [place], AT Cardiovascular: No clubbing, cyanosis, or edema Respiratory: Normal respiratory effort, no increased work of breathing Skin: No rashes, bruises or suspicious lesions Neurologic: Grossly intact, no focal deficits, moving all 4  extremities Psychiatric: Normal mood and affect  Laboratory Data: Results for orders placed or performed in visit on 12/29/22  Microscopic Examination   Urine  Result Value Ref Range   WBC, UA 11-30 (A) 0 - 5 /hpf   RBC, Urine 0-2 0 - 2 /hpf   Epithelial Cells (non renal) 0-10 0 - 10 /hpf   Bacteria, UA Moderate (A) None seen/Few  Urinalysis, Complete  Result Value Ref Range   Specific Gravity, UA >1.030 (H) 1.005 - 1.030   pH, UA 6.0 5.0 - 7.5   Color, UA Yellow Yellow   Appearance Ur Hazy (A) Clear   Leukocytes,UA Trace (A) Negative   Protein,UA Negative Negative/Trace   Glucose, UA Negative Negative   Ketones, UA Negative  Negative   RBC, UA Negative Negative   Bilirubin, UA Negative Negative   Urobilinogen, Ur 0.2 0.2 - 1.0 mg/dL   Nitrite, UA Negative Negative   Microscopic Examination See below:    Assessment & Plan:   1. Recurrent UTI UA today notable for pyuria and bacteriuria, though she is minimally symptomatic.  Will send urine for culture today and treat per results, as I would like to avoid starting empiric antibiotics after she completed 2 other courses in the past month.  She is in agreement with this plan.  Will obtain CT stone study to rule out underlying stone nidus or other structural abnormality that could be contributory, though she had no evidence of urolithiasis on CT stone study 2 years ago.  I would also like her to start topical vaginal estrogen cream for UTI prevention.  We also discussed that this may help with her OAB. - Urinalysis, Complete - CULTURE, URINE COMPREHENSIVE - CT RENAL STONE STUDY; Future - estradiol (ESTRACE) 0.1 MG/GM vaginal cream; Apply one pea-sized amount around the opening of the urethra daily for 2 weeks, then 3 times weekly moving forward.  Dispense: 42.5 g; Refill: 12  2. OAB (overactive bladder) Will reach out to her [employer] rep to see if the device would be safe given the electromagnetic fields at her  workplace.  Return for Will call with results.  Debroah Loop, PA-C  [facility] [address], [place], [place] [address] [phone]

## 2022-12-30 ENCOUNTER — Telehealth: Payer: Self-pay | Admitting: Physician Assistant

## 2022-12-30 NOTE — Telephone Encounter (Signed)
Per DPR ok to leave message on VM, left detailed messaged advised pt to call back if any questions.

## 2022-12-30 NOTE — Telephone Encounter (Signed)
Can you please let her know I reached out to our [employer] rep and she agreed that the electromagnetic fields in her workplace are likely incompatible with the InterSt[employer] device. We will have to defer this upon until her retirement.

## 2023-01-01 LAB — CULTURE, URINE COMPREHENSIVE

## 2023-01-04 ENCOUNTER — Telehealth: Payer: Self-pay | Admitting: *Deleted

## 2023-01-04 MED ORDER — SULFAMETHOXAZOLE-TRIMETHOPRIM 800-160 MG PO TABS: 1.0000 | ORAL_TABLET | Freq: Two times a day (BID) | ORAL | 0 refills | Status: DC

## 2023-01-04 NOTE — Telephone Encounter (Signed)
-----  Message from Debroah Loop, [place] sent at 01/04/2023 11:30 AM EST ----- Bactrim DS BID x7 days, keep plans for CT scan. Will call with results when available.

## 2023-01-04 NOTE — Telephone Encounter (Signed)
.  left message to have patient return my call.

## 2023-01-04 NOTE — Telephone Encounter (Signed)
Patient advised and RX sent in to [facility].

## 2023-01-05 ENCOUNTER — Ambulatory Visit: Payer: BC Managed Care – PPO

## 2023-01-09 DIAGNOSIS — D235 Other benign neoplasm of skin of trunk: Secondary | ICD-10-CM | POA: Diagnosis not present

## 2023-01-11 ENCOUNTER — Ambulatory Visit: Payer: BC Managed Care – PPO

## 2023-01-13 ENCOUNTER — Ambulatory Visit
Admission: EM | Admit: 2023-01-13 | Discharge: 2023-01-13 | Disposition: A | Discharge status: Home / Self Care | Payer: BC Managed Care – PPO | Attending: Nurse Practitioner | Admitting: Nurse Practitioner

## 2023-01-13 DIAGNOSIS — R0981 Nasal congestion: Secondary | ICD-10-CM | POA: Diagnosis not present

## 2023-01-13 DIAGNOSIS — Z28310 Unvaccinated for covid-19: Secondary | ICD-10-CM | POA: Insufficient documentation

## 2023-01-13 DIAGNOSIS — J069 Acute upper respiratory infection, unspecified: Secondary | ICD-10-CM | POA: Diagnosis not present

## 2023-01-13 DIAGNOSIS — Z1152 Encounter for screening for COVID-19: Secondary | ICD-10-CM | POA: Insufficient documentation

## 2023-01-13 LAB — RESP PANEL BY RT-PCR (RSV, FLU A&B, COVID)  RVPGX2
Influenza A by PCR: NEGATIVE
Influenza B by PCR: NEGATIVE
Resp Syncytial Virus by PCR: NEGATIVE
SARS Coronavirus 2 by RT PCR: NEGATIVE

## 2023-01-13 NOTE — ED Triage Notes (Signed)
Pt c/o facial pressure and congestion, headache, nausea, ear pain x3days  Pt asks for a covid test to be done.

## 2023-01-13 NOTE — ED Provider Notes (Signed)
[facility]    CSN: [id number] Arrival date & time: 01/13/23  4008      History   Chief Complaint Chief Complaint  Patient presents with   Nasal Congestion    HPI Rose Bernard is a 57 y.o. female who presents for evaluation of URI symptoms for 3 days. Patient reports associated symptoms of sinus pressure/pain, ear pain. Denies N/V/D, cough, fevers, chills, body aches, sore throat, shortness of breath. Patient does not have a hx of asthma or smoking. No known sick contacts and no recent travel. Pt is not vaccinated for COVID. Pt is vaccinated for flu this season. Pt has taken cough drops OTC for symptoms.  She is currently on Bactrim for pyelonephritis.  Pt has no other concerns at this time.   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   Osteopenia 10/14/2022   Lower extremity edema 10/05/2022   History of foot surgery 06/19/2022   Encounter for completion of form with patient 03/18/2022   Ankle pain 03/07/2022   Sinus pressure 08/22/2021   Pain in Achilles tendon 03/11/2021   Chronic venous insufficiency 11/30/2020   Lymphedema 11/10/2020   Lab test positive for detection of COVID-19 virus 07/31/2020   Family history of thrombosis 04/28/2020   Family history of cancer 04/28/2020   Thrombophlebitis of superficial veins of left lower extremity 04/28/2020   URI (upper respiratory infection) 04/27/2020   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   Internal nasal lesion 11/24/2019   Foot pain, right 11/24/2019   GERD (gastroesophageal reflux disease) 11/24/2019   Abdominal pain 09/15/2019    Chest pain 09/15/2019   Stress 04/23/2018   Anemia 03/31/2018   Shingles 01/18/2018   Gross hematuria 10/24/2017   Nephrolithiasis 67/61/9509   Renal colic 32/67/1245   Left carotid bruit 08/02/2017   Hot flashes 02/09/2016   Loss of weight 10/02/2015   Myofascial pain 09/16/2015   Chronic female pelvic pain 09/16/2015   Abdominal pain, left lower quadrant 09/07/2015   Abnormal mammogram 04/12/2015   Health care maintenance 04/12/2015   Sacral pain 12/29/2013   Urinary frequency 05/15/2013   Urge incontinence of urine 05/15/2013   UTI (urinary tract infection) 02/27/2013   Family history of colon cancer 02/20/2013   Headache 02/11/2013   Chronic cystitis 02/11/2013   History of ovarian cyst 02/11/2013   Reflux, vesicoureteral 10/15/2012   Symptoms involving urinary system 10/15/2012   Mixed incontinence 10/15/2012   Incomplete bladder emptying 10/15/2012   Disorder of bladder function 10/15/2012    Past Surgical History:  Procedure Laterality Date   ABDOMINAL HYSTERECTOMY  1995   2/2 delivery complications with 3rd child. Hysterectomy and bladder repair with subsequent appendectomy. L Ovary remain in place    APPENDECTOMY     2001 due to scar tissue   BLADDER BOTOX  2014, 2015, 2016   BREAST BIOPSY Bilateral 1991   BREAST BIOPSY Left 12/12/2019   [place] bx, path pending   BREAST LUMPECTOMY     BREAST SURGERY Bilateral 2002   benign mass removed   COLONOSCOPY  2014  EXCISION OF BREAST BIOPSY Left 01/10/2020   Procedure: EXCISION OF BREAST BIOPSY w/ Needle localization;  Surgeon: Herbert Pun, MD;  Location: [facility];  Service: General;  Laterality: Left;   FOOT SURGERY Bilateral    LAPAROSCOPIC HYSTERECTOMY  1995   MASTECTOMY, PARTIAL Left 01/31/2020   Procedure: MASTECTOMY PARTIAL;  Surgeon: Herbert Pun, MD;  Location: [facility];  Service: General;  Laterality: Left;   reconstruction of bladder   1995   2/2 delivery complications with 3rd child      OB History     Gravida  3   Para  2   Term      Preterm      AB      Living  2      SAB      IAB      Ectopic      Multiple      Live Births           Obstetric Comments  One still born 1st Menstrual Cycle:  65  1st Pregnancy:  24           Home Medications    Prior to Admission medications   Medication Sig Start Date End Date Taking? Authorizing Provider  Azelastine HCl 137 MCG/SPRAY SOLN Place 1 spray into both nostrils 2 (two) times daily. 11/17/22  Yes Einar Pheasant, MD  estradiol (ESTRACE) 0.1 MG/GM vaginal cream Apply one pea-sized amount around the opening of the urethra daily for 2 weeks, then 3 times weekly moving forward. 12/29/22  Yes Vaillancourt, Samantha, PA-C  fluticasone (FLONASE) 50 MCG/ACT nasal spray Place 2 sprays into both nostrils daily. 11/17/22  Yes Einar Pheasant, MD  sulfamethoxazole-trimethoprim (BACTRIM DS) 800-160 MG tablet Take 1 tablet by mouth 2 (two) times daily. 01/04/23  Yes Vaillancourt, Samantha, PA-C  vitamin E 1000 UNIT capsule Take 1,000 Units by mouth daily.   Yes [provider]    Family History Family History  Problem Relation Age of Onset   Colon cancer Father        With ostomy in place    Breast cancer Paternal Aunt 8   Breast cancer Maternal Aunt    Breast cancer Maternal Aunt     Social History Social History   Tobacco Use   Smoking status: Never    Passive exposure: Never   Smokeless tobacco: Never  Vaping Use   Vaping Use: Never used  Substance Use Topics   Alcohol use: No    Alcohol/week: 0.0 standard drinks of alcohol   Drug use: No     Allergies   Penicillin v potassium   Review of Systems Review of Systems  HENT:  Positive for congestion, ear pain, sinus pressure and sinus pain.      Physical Exam Triage Vital Signs ED Triage Vitals  Enc Vitals Group     BP 01/13/23 0911 130/77     Pulse Rate 01/13/23 0911 71     Resp 01/13/23 0911 18     Temp 01/13/23  0911 98.3 F (36.8 C)     Temp Source 01/13/23 0911 Oral     SpO2 01/13/23 0911 99 %     Weight 01/13/23 0909 150 lb (68 kg)     Height 01/13/23 0909 '5\' 6"'$  (1.676 m)     Head Circumference --      Peak Flow --      Pain Score 01/13/23 0909 6     Pain Loc --  Pain Edu? --      Excl. in [place]? --    No data found.  Updated Vital Signs BP 130/77 (BP Location: Left Arm)   Pulse 71   Temp 98.3 F (36.8 C) (Oral)   Resp 18   Ht '5\' 6"'$  (1.676 m)   Wt 150 lb (68 kg)   LMP 02/07/1994   SpO2 99%   BMI 24.21 kg/m   Visual Acuity Right Eye Distance:   Left Eye Distance:   Bilateral Distance:    Right Eye Near:   Left Eye Near:    Bilateral Near:     Physical Exam Vitals and nursing note reviewed.  Constitutional:      General: She is not in acute distress.    Appearance: She is well-developed. She is not ill-appearing.  HENT:     Head: Normocephalic and atraumatic.     Right Ear: Tympanic membrane and ear canal normal.     Left Ear: Tympanic membrane and ear canal normal.     Nose: Congestion present.     Mouth/Throat:     Mouth: Mucous membranes are moist.     Pharynx: Oropharynx is clear. Uvula midline. No oropharyngeal exudate or posterior oropharyngeal erythema.     Tonsils: No tonsillar exudate or tonsillar abscesses.  Eyes:     Conjunctiva/sclera: Conjunctivae normal.     Pupils: Pupils are equal, round, and reactive to light.  Cardiovascular:     Rate and Rhythm: Normal rate and regular rhythm.     Heart sounds: Normal heart sounds.  Pulmonary:     Effort: Pulmonary effort is normal.     Breath sounds: Normal breath sounds.  Musculoskeletal:     Cervical back: Normal range of motion and neck supple.  Lymphadenopathy:     Cervical: No cervical adenopathy.  Skin:    General: Skin is warm and dry.  Neurological:     General: No focal deficit present.     Mental Status: She is alert and oriented to person, place, and time.  Psychiatric:        Mood and  Affect: Mood normal.        Behavior: Behavior normal.      UC Treatments / Results  Labs (all labs ordered are listed, but only abnormal results are displayed) Labs Reviewed  RESP PANEL BY RT-PCR (RSV, FLU A&B, COVID)  [id number]    EKG   Radiology No results found.  Procedures Procedures (including critical care time)  Medications Ordered in UC Medications - No data to display  Initial Impression / Assessment and Plan / UC Course  I have reviewed the triage vital signs and the nursing notes.  Pertinent labs & imaging results that were available during my care of the patient were reviewed by me and considered in my medical decision making (see chart for details).     Reviewed exam and symptoms with patient.  No red flags on exam. COVID/flu/RSV PCR.  Patient did not want to wait for results and will contact if positive to discuss treatment if needed. Neg results to be viewed on mychart and pt verbalized understanding Discussed viral URI and symptomatic treatment with rest and fluids Nasal rinses as tolerated Flonase OTC Continue Bactrim as prescribed for her pyelonephritis ER precautions reviewed and patient verbalized understanding Follow-up with PCP in 2 to 3 days for recheck Final Clinical Impressions(s) / UC Diagnoses   Final diagnoses:  Viral upper respiratory tract infection     Discharge Instructions  Your symptoms and exam are consistent for a viral illness. Please treat your symptoms with over the counter cough medication, tylenol or ibuprofen, humidifier, and rest. Viral illnesses can last 7-14 days. Please follow up with your PCP if your symptoms are not improving. Please go to the ER for any worsening symptoms. This includes but is not limited to fever you can not control with tylenol or ibuprofen, you are not able to stay hydrated, you have shortness of breath or chest pain.  Thank you for choosing [facility] for your healthcare needs. I hope you feel  better soon!     ED Prescriptions   None    PDMP not reviewed this encounter.   Melynda Ripple, NP 01/13/23 1019

## 2023-01-13 NOTE — Discharge Instructions (Signed)
Your symptoms and exam are consistent for a viral illness. Please treat your symptoms with over the counter cough medication, tylenol or ibuprofen, humidifier, and rest. Viral illnesses can last 7-14 days. Please follow up with your PCP if your symptoms are not improving. Please go to the ER for any worsening symptoms. This includes but is not limited to fever you can not control with tylenol or ibuprofen, you are not able to stay hydrated, you have shortness of breath or chest pain.  Thank you for choosing [facility] for your healthcare needs. I hope you feel better soon!  

## 2023-01-19 ENCOUNTER — Ambulatory Visit
Admission: RE | Admit: 2023-01-19 | Discharge: 2023-01-19 | Disposition: A | Discharge status: Home / Self Care | Payer: BC Managed Care – PPO | Source: Ambulatory Visit | Attending: Physician Assistant | Admitting: Physician Assistant

## 2023-01-19 DIAGNOSIS — Z87442 Personal history of urinary calculi: Secondary | ICD-10-CM | POA: Diagnosis not present

## 2023-01-19 DIAGNOSIS — K5710 Diverticulosis of small intestine without perforation or abscess without bleeding: Secondary | ICD-10-CM | POA: Diagnosis not present

## 2023-01-19 DIAGNOSIS — M160 Bilateral primary osteoarthritis of hip: Secondary | ICD-10-CM | POA: Diagnosis not present

## 2023-01-19 DIAGNOSIS — N390 Urinary tract infection, site not specified: Secondary | ICD-10-CM | POA: Insufficient documentation

## 2023-01-19 DIAGNOSIS — N281 Cyst of kidney, acquired: Secondary | ICD-10-CM | POA: Diagnosis not present

## 2023-01-23 DIAGNOSIS — D235 Other benign neoplasm of skin of trunk: Secondary | ICD-10-CM | POA: Diagnosis not present

## 2023-01-26 ENCOUNTER — Encounter: Payer: Self-pay | Admitting: Internal Medicine

## 2023-01-26 ENCOUNTER — Ambulatory Visit (INDEPENDENT_AMBULATORY_CARE_PROVIDER_SITE_OTHER): Payer: BC Managed Care – PPO | Admitting: Internal Medicine

## 2023-01-26 VITALS — BP 128/70 | HR 71 | Temp 98.0°F | Resp 16 | Ht 67.0 in | Wt 157.0 lb

## 2023-01-26 DIAGNOSIS — R1024 Suprapubic pain: Secondary | ICD-10-CM

## 2023-01-26 DIAGNOSIS — F439 Reaction to severe stress, unspecified: Secondary | ICD-10-CM

## 2023-01-26 DIAGNOSIS — Z0000 Encounter for general adult medical examination without abnormal findings: Secondary | ICD-10-CM

## 2023-01-26 DIAGNOSIS — R600 Localized edema: Secondary | ICD-10-CM

## 2023-01-26 DIAGNOSIS — N3020 Other chronic cystitis without hematuria: Secondary | ICD-10-CM

## 2023-01-26 DIAGNOSIS — D0512 Intraductal carcinoma in situ of left breast: Secondary | ICD-10-CM

## 2023-01-26 DIAGNOSIS — I872 Venous insufficiency (chronic) (peripheral): Secondary | ICD-10-CM

## 2023-01-26 DIAGNOSIS — D649 Anemia, unspecified: Secondary | ICD-10-CM

## 2023-01-26 DIAGNOSIS — Z13220 Encounter for screening for lipoid disorders: Secondary | ICD-10-CM | POA: Diagnosis not present

## 2023-01-26 DIAGNOSIS — R102 Pelvic and perineal pain: Secondary | ICD-10-CM | POA: Diagnosis not present

## 2023-01-26 DIAGNOSIS — J3489 Other specified disorders of nose and nasal sinuses: Secondary | ICD-10-CM

## 2023-01-26 MED ORDER — DOXYCYCLINE HYCLATE 100 MG PO TABS: 100.0000 mg | ORAL_TABLET | Freq: Two times a day (BID) | ORAL | 0 refills | Status: DC

## 2023-01-26 NOTE — Progress Notes (Signed)
Subjective:    Patient ID: Rose Bernard, female    DOB: 04/06/1966, 57 y.o.   MRN: [id number]  Patient here for  Chief Complaint  Patient presents with   Medical Management of Chronic Issues    HPI Here for a physical exam.  Followed by urology for persistent urinary issues. She is having to wear a pad now due to leakage.  She has tried multiple treatments for overactive bladder.  She is concerned she has UTI.  Would like urine checked.  Tries to stay active.  No chest pain or sob reported.  With increased congestion/sinus pressure - sinus infection.     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 Hysterectomy and bladder repair with subsequent appendectomy. L Ovary remain in place    APPENDECTOMY     2001 due to scar tissue   BLADDER BOTOX  2014, 2015, 2016   BREAST BIOPSY Bilateral 1991   BREAST BIOPSY Left 12/12/2019   [place] bx, path pending   BREAST LUMPECTOMY     BREAST SURGERY Bilateral 2002   benign mass removed   COLONOSCOPY  2014   EXCISION OF BREAST BIOPSY Left 01/10/2020   Procedure: EXCISION OF BREAST BIOPSY w/ Needle localization;  Surgeon: Herbert Pun, MD;  Location: [facility];  Service: General;  Laterality: Left;   FOOT SURGERY Bilateral    LAPAROSCOPIC HYSTERECTOMY  1995   MASTECTOMY, PARTIAL Left 01/31/2020   Procedure: MASTECTOMY PARTIAL;  Surgeon: Herbert Pun, MD;  Location: [facility];  Service: General;  Laterality: Left;   reconstruction of bladder   1995   2/2 delivery complications with 3rd child    Family History  Problem Relation Age  of Onset   Colon cancer Father        With ostomy in place    Breast cancer Paternal Aunt 33   Breast cancer Maternal Aunt    Breast cancer Maternal Aunt    Social History   Socioeconomic History   Marital status: Married    Spouse name: Not on file   Number of children: 2   Years of education: Not on file   Highest education level: Not on file  Occupational History   Not on file  Tobacco Use   Smoking status: Never    Passive exposure: Never   Smokeless tobacco: Never  Vaping Use   Vaping Use: Never used  Substance and Sexual Activity   Alcohol use: No    Alcohol/week: 0.0 standard drinks of alcohol   Drug use: No   Sexual activity: Yes    Birth control/protection: Surgical  Other Topics Concern   Not on file  Social History Narrative   Not on file   Social Determinants of Health   Financial Resource Strain: Not on file  Food Insecurity: Not on file  Transportation Needs: Not on file  Physical Activity: Not on file  Stress: Not on file  Social Connections: Not on file     Review of Systems  Constitutional:  Negative for appetite change and unexpected weight change.  HENT:  Negative for congestion, sinus pressure and sore throat.   Eyes:  Negative for pain and visual disturbance.  Respiratory:  Negative for cough, chest tightness and shortness of breath.   Cardiovascular:  Negative for chest pain, palpitations and leg swelling.  Gastrointestinal:  Negative for abdominal pain, diarrhea, nausea and vomiting.  Genitourinary:  Positive for dysuria. Negative for difficulty urinating.  Musculoskeletal:  Negative for joint swelling and myalgias.  Skin:  Negative for color change and rash.  Neurological:  Negative for dizziness and headaches.  Hematological:  Negative for adenopathy. Does not bruise/bleed easily.  Psychiatric/Behavioral:  Negative for agitation and dysphoric mood.        Objective:     BP 128/70   Pulse 71   Temp 98 F (36.7 C)   Resp 16    Ht 5' 7"$  (1.702 m)   Wt 157 lb (71.2 kg)   LMP 02/07/1994   SpO2 98%   BMI 24.59 kg/m  Wt Readings from Last 3 Encounters:  01/26/23 157 lb (71.2 kg)  01/13/23 150 lb (68 kg)  12/29/22 150 lb (68 kg)    Physical Exam Vitals reviewed.  Constitutional:      General: She is not in acute distress.    Appearance: Normal appearance. She is well-developed.  HENT:     Head: Normocephalic and atraumatic.     Right Ear: External ear normal.     Left Ear: External ear normal.  Eyes:     General: No scleral icterus.       Right eye: No discharge.        Left eye: No discharge.     Conjunctiva/sclera: Conjunctivae normal.  Neck:     Thyroid: No thyromegaly.  Cardiovascular:     Rate and Rhythm: Normal rate and regular rhythm.  Pulmonary:     Effort: No tachypnea, accessory muscle usage or respiratory distress.     Breath sounds: Normal breath sounds. No decreased breath sounds or wheezing.  Chest:  Breasts:    Right: No inverted nipple, mass, nipple discharge or tenderness (no axillary adenopathy).     Left: No inverted nipple, mass, nipple discharge or tenderness (no axilarry adenopathy).  Abdominal:     General: Bowel sounds are normal.     Palpations: Abdomen is soft.     Tenderness: There is no abdominal tenderness.  Musculoskeletal:        General: No swelling or tenderness.     Cervical back: Neck supple.  Lymphadenopathy:     Cervical: No cervical adenopathy.  Skin:    Findings: No erythema or rash.  Neurological:     Mental Status: She is alert and oriented to person, place, and time.  Psychiatric:        Mood and Affect: Mood normal.        Behavior: Behavior normal.      Outpatient Encounter Medications as of 01/26/2023  Medication Sig   doxycycline (VIBRA-TABS) 100 MG tablet Take 1 tablet (100 mg total) by mouth 2 (two) times daily.   Azelastine HCl 137 MCG/SPRAY SOLN Place 1 spray into both nostrils 2 (two) times daily.   estradiol (ESTRACE) 0.1 MG/GM  vaginal cream Apply one pea-sized amount around the opening of the urethra daily for 2 weeks, then 3 times weekly moving forward.   fluticasone (FLONASE) 50 MCG/ACT nasal spray Place 2 sprays into both nostrils daily.   vitamin E 1000 UNIT capsule Take 1,000 Units by mouth daily.   [DISCONTINUED] sulfamethoxazole-trimethoprim (BACTRIM DS)  800-160 MG tablet Take 1 tablet by mouth 2 (two) times daily.   No facility-administered encounter medications on file as of 01/26/2023.     Lab Results  Component Value Date   WBC 4.5 10/14/2022   HGB 12.5 10/14/2022   HCT 38.9 10/14/2022   PLT 242 10/14/2022   GLUCOSE 94 10/14/2022   CHOL 230 (H) 07/05/2022   TRIG 96.0 07/05/2022   HDL 70.20 07/05/2022   LDLCALC 141 (H) 07/05/2022   ALT 18 10/14/2022   AST 17 10/14/2022   NA 141 10/14/2022   K 4.1 10/14/2022   CL 108 10/14/2022   CREATININE 0.73 10/14/2022   BUN 15 10/14/2022   CO2 28 10/14/2022   TSH 2.72 07/05/2022   HGBA1C 5.6 10/01/2015    CT RENAL STONE STUDY  Result Date: 01/19/2023 CLINICAL DATA:  Pelvic and bilateral flank pain. History of renal stones. EXAM: CT ABDOMEN AND PELVIS WITHOUT CONTRAST TECHNIQUE: Multidetector CT imaging of the abdomen and pelvis was performed following the standard protocol without IV contrast. RADIATION DOSE REDUCTION: This exam was performed according to the departmental dose-optimization program which includes automated exposure control, adjustment of the mA and/or kV according to patient size and/or use of iterative reconstruction technique. COMPARISON:  December 25, 2020. FINDINGS: Lower chest: No acute abnormality. Hepatobiliary: Unremarkable noncontrast enhanced appearance of the hepatic parenchyma. Gallbladder is unremarkable. No biliary ductal dilation Pancreas: Periampullary duodenal diverticulum. No pancreatic ductal dilation or evidence of acute inflammation. Spleen: No splenomegaly. Adrenals/Urinary Tract: Bilateral adrenal glands appear normal. 3.3  cm left renal sinus cyst is stable and considered benign requiring no independent imaging follow-up. No hydronephrosis. No renal, ureteral or bladder calculi. Urinary bladder is unremarkable for degree of distension Stomach/Bowel: No radiopaque enteric contrast material was administered. Stomach is unremarkable for degree of distension. No pathologic dilation of small or large bowel. No evidence of acute bowel inflammation. Vascular/Lymphatic: Normal caliber abdominal aorta. Smooth IVC contours. No pathologically enlarged abdominal or pelvic lymph nodes. Reproductive: Status post hysterectomy. No adnexal masses. Other: No significant abdominopelvic free fluid Musculoskeletal: Multilevel degenerative changes spine. No acute osseous abnormality. Very mild degenerative spurring of the bilateral hips. IMPRESSION: No acute abnormality in the abdomen or pelvis. Specifically, no evidence of obstructive uropathy. Electronically Signed   By: Dahlia Bailiff M.D.   On: 01/19/2023 18:25       Assessment & Plan:  Routine general medical examination at a health care facility  Screening cholesterol level -     Lipid panel; Future -     Hepatic function panel; Future  Anemia, unspecified type Assessment & Plan: Follow cbc.  Orders: -     CBC with Differential/Platelet; Future -     Basic metabolic panel; Future -     TSH; Future  Suprapubic pain -     Urinalysis, Routine w reflex microscopic -     Urine Culture  Chronic cystitis Assessment & Plan: Urology (12/29/22)- recommended CT stone study.  She is concerned may have UTI.  Request to have urine checked.  F/u with urology regarding further treatment of chronic symptoms.     Chronic venous insufficiency Assessment & Plan: History of thrombosis - right leg.  Saw [facility].  Recommended compression hose.     Ductal carcinoma in situ (DCIS) of left breast Assessment & Plan: Left DCIS, grade 1-2. Status post lumpectomy and adjuvant radiation. Not able to  tolerate aromatase inhibitor.  Currently off endocrine therapy. Continue surveillance with annual mammogram. Continue annual mammogram screening. (Dr  Yu - 10/14/22)   Lower extremity edema Assessment & Plan: Dr Lucky Cowboy 10/04/22 - venous reflux study.  Compression hose.     Sinus pressure Assessment & Plan: Nasal congestion / sinus pressure.  Symptoms c/w sinus infection.  Continue saline nasal spray, flonase and astelin nasal spray.  Doxycycline.  Follow.    Stress Assessment & Plan: Overall appears to be handling things relatively well.  Follow.    Other orders -     Doxycycline Hyclate; Take 1 tablet (100 mg total) by mouth 2 (two) times daily.  Dispense: 14 tablet; Refill: 0     Einar Pheasant, MD

## 2023-01-27 LAB — URINALYSIS, ROUTINE W REFLEX MICROSCOPIC
Bilirubin Urine: NEGATIVE
Hgb urine dipstick: NEGATIVE
Ketones, ur: NEGATIVE
Leukocytes,Ua: NEGATIVE
Nitrite: NEGATIVE
Specific Gravity, Urine: 1.015 (ref 1.000–1.030)
Total Protein, Urine: NEGATIVE
Urine Glucose: NEGATIVE
Urobilinogen, UA: 0.2 (ref 0.0–1.0)
pH: 6 (ref 5.0–8.0)

## 2023-01-29 LAB — URINE CULTURE
MICRO NUMBER:: 14570301
SPECIMEN QUALITY:: ADEQUATE

## 2023-01-30 ENCOUNTER — Encounter: Payer: Self-pay | Admitting: Internal Medicine

## 2023-01-30 NOTE — Assessment & Plan Note (Signed)
Nasal congestion / sinus pressure.  Symptoms c/w sinus infection.  Continue saline nasal spray, flonase and astelin nasal spray.  Doxycycline.  Follow.

## 2023-01-30 NOTE — Assessment & Plan Note (Signed)
History of thrombosis - right leg.  Saw [facility].  Recommended compression hose.

## 2023-01-30 NOTE — Assessment & Plan Note (Signed)
Urology (12/29/22)- recommended CT stone study.  She is concerned may have UTI.  Request to have urine checked.  F/u with urology regarding further treatment of chronic symptoms.

## 2023-01-30 NOTE — Assessment & Plan Note (Signed)
Follow cbc.  

## 2023-01-30 NOTE — Assessment & Plan Note (Signed)
Dr Lucky Cowboy 10/04/22 - venous reflux study.  Compression hose.

## 2023-01-30 NOTE — Assessment & Plan Note (Signed)
Left DCIS, grade 1-2. Status post lumpectomy and adjuvant radiation. Not able to tolerate aromatase inhibitor.  Currently off endocrine therapy. Continue surveillance with annual mammogram. Continue annual mammogram screening. (Dr Tasia Catchings - 10/14/22)

## 2023-01-30 NOTE — Assessment & Plan Note (Signed)
Overall appears to be handling things relatively well.  Follow.   

## 2023-01-31 ENCOUNTER — Telehealth: Payer: Self-pay

## 2023-01-31 NOTE — Telephone Encounter (Signed)
-----   Message from Einar Pheasant, MD sent at 01/31/2023  4:23 AM EST ----- Please call and notify Rose Bernard that her urine culture did reveal a bacteria present, but at low colony count.  Please let her know that I spoke to urology and they are going to be in contact with her to work her in for further f/u as we discussed at her appt.

## 2023-01-31 NOTE — Telephone Encounter (Signed)
LMTCB regarding results.  

## 2023-01-31 NOTE — Telephone Encounter (Signed)
Pt called in returning [place] call. Note below was read to her. Pt aware. She wants to still f/u with Larena Glassman about a vascular doctor as well. She's available after 3pm @336$ -[phone].

## 2023-02-01 NOTE — Telephone Encounter (Signed)
Patient states she has been trying to get in with [facility] but has been unable to get an appt. Advised I will look into this and discuss with Dr Nicki Reaper to see if anything further needs to be done. Referral was placed 08/2022. Will f/u with patient

## 2023-02-08 NOTE — Telephone Encounter (Signed)
LM for [facility] to return my call regarding below.

## 2023-02-09 ENCOUNTER — Other Ambulatory Visit: Payer: BC Managed Care – PPO

## 2023-02-10 ENCOUNTER — Ambulatory Visit (INDEPENDENT_AMBULATORY_CARE_PROVIDER_SITE_OTHER): Payer: BC Managed Care – PPO | Admitting: Physician Assistant

## 2023-02-10 VITALS — BP 130/79 | HR 62 | Ht 67.0 in | Wt 157.0 lb

## 2023-02-10 DIAGNOSIS — N3281 Overactive bladder: Secondary | ICD-10-CM | POA: Diagnosis not present

## 2023-02-10 DIAGNOSIS — N3941 Urge incontinence: Secondary | ICD-10-CM | POA: Diagnosis not present

## 2023-02-10 DIAGNOSIS — R350 Frequency of micturition: Secondary | ICD-10-CM

## 2023-02-10 DIAGNOSIS — Z87440 Personal history of urinary (tract) infections: Secondary | ICD-10-CM

## 2023-02-10 DIAGNOSIS — N390 Urinary tract infection, site not specified: Secondary | ICD-10-CM

## 2023-02-10 LAB — URINALYSIS, COMPLETE
Bilirubin, UA: NEGATIVE
Glucose, UA: NEGATIVE
Ketones, UA: NEGATIVE
Leukocytes,UA: NEGATIVE
Nitrite, UA: NEGATIVE
Protein,UA: NEGATIVE
RBC, UA: NEGATIVE
Specific Gravity, UA: 1.01 (ref 1.005–1.030)
Urobilinogen, Ur: 0.2 mg/dL (ref 0.2–1.0)
pH, UA: 7 (ref 5.0–7.5)

## 2023-02-10 LAB — MICROSCOPIC EXAMINATION: Bacteria, UA: NONE SEEN

## 2023-02-10 NOTE — Patient Instructions (Addendum)
You have severe, refractory OAB and have failed multiple medications, intravesical Botox, and PTNS. Interstim would be the next available treatment option, however it is contraindicated due to your work around [employer].

## 2023-02-10 NOTE — Progress Notes (Signed)
02/10/2023 3:49 PM   Rose Bernard 11-Jan-1966 [id number]  CC: Chief Complaint  Patient presents with   Follow-up   HPI: Rose Bernard is a 57 y.o. female with PMH ureteral reimplant in the 1990s, severe refractory OAB who has failed multiple medications, intravesical Botox, and PTNS, and recent recurrent UTIs who presents today for follow-up.  Her PCP treated her for another UTI last month with urine culture growing colony counts of pansensitive E. coli.  Today she reports ongoing severe, refractory urgency, frequency, and urge incontinence.  She wears 2 heavy pads daily, which are soaked and leaking through.  She remains extremely bothered by her OAB symptoms but is rather discouraged since she has failed other therapies.  We repeated a CT stone study last month with no significant findings or urolithiasis.  In-office UA and microscopy today pan negative.  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 Hysterectomy and bladder repair with subsequent appendectomy. L Ovary remain in place    APPENDECTOMY     2001 due to scar tissue   BLADDER BOTOX  2014, 2015, 2016   BREAST BIOPSY Bilateral 1991   BREAST BIOPSY Left 12/12/2019   [place] bx, path pending   BREAST LUMPECTOMY     BREAST SURGERY Bilateral 2002   benign mass removed   COLONOSCOPY  2014   EXCISION OF BREAST BIOPSY Left 01/10/2020   Procedure: EXCISION OF BREAST BIOPSY w/ Needle localization;  Surgeon: Herbert Pun, MD;  Location: [facility];  Service: General;  Laterality: Left;   FOOT  SURGERY Bilateral    [place], PARTIAL Left 01/31/2020   Procedure: MASTECTOMY PARTIAL;  Surgeon: Herbert Pun, MD;  Location: [facility];  Service: General;  Laterality: Left;   reconstruction of bladder   1995   2/2 delivery complications with 3rd child     Home Medications:  Allergies as of 02/10/2023       Reactions   Penicillin V Potassium Rash   Did it involve swelling of the face/tongue/throat, SOB, or low BP? No Did it involve sudden or severe rash/hives, skin peeling, or any reaction on the inside of your mouth or nose? No Did you need to seek medical attention at a hospital or doctor's office? No When did it last happen?      25 years ago If all above answers are "NO", may proceed with cephalosporin use.        Medication List        Accurate as of February 10, 2023  3:49 PM. If you have any questions, ask your nurse or doctor.          STOP taking these medications    doxycycline 100 MG tablet Commonly known as: VIBRA-TABS       TAKE these medications    Azelastine HCl 137 MCG/SPRAY Soln Place 1 spray into both nostrils 2 (two) times daily.   estradiol 0.1 MG/GM vaginal cream Commonly known as: ESTRACE Apply one pea-sized amount around the opening of the urethra  daily for 2 weeks, then 3 times weekly moving forward.   fluticasone 50 MCG/ACT nasal spray Commonly known as: FLONASE Place 2 sprays into both nostrils daily.   vitamin E 1000 UNIT capsule Take 1,000 Units by mouth daily.        Allergies:  Allergies  Allergen Reactions   Penicillin V Potassium Rash    Did it involve swelling of the face/tongue/throat, SOB, or low BP? No Did it involve sudden or severe rash/hives, skin peeling, or any reaction on the inside of your mouth or nose? No Did you need to seek medical attention at a hospital or doctor's office? No When did it last happen?      25 years ago If all above answers are "NO", may proceed  with cephalosporin use.     Family History: Family History  Problem Relation Age of Onset   Colon cancer Father        With ostomy in place    Breast cancer Paternal Aunt 16   Breast cancer Maternal Aunt    Breast cancer Maternal Aunt     Social History:   reports that she has never smoked. She has never been exposed to tobacco smoke. She has never used smokeless tobacco. She reports that she does not drink alcohol and does not use drugs.  Physical Exam: BP 130/79   Pulse 62   Ht '5\' 7"'$  (1.702 m)   Wt 157 lb (71.2 kg)   LMP 02/07/1994   BMI 24.59 kg/m   Constitutional:  Alert and oriented, no acute distress, nontoxic appearing HEENT: [place], AT Cardiovascular: No clubbing, cyanosis, or edema Respiratory: Normal respiratory effort, no increased work of breathing Skin: No rashes, bruises or suspicious lesions Neurologic: Grossly intact, no focal deficits, moving all 4 extremities Psychiatric: Normal mood and affect  Laboratory Data: Results for orders placed or performed in visit on 02/10/23  Microscopic Examination   Urine  Result Value Ref Range   WBC, UA 0-5 0 - 5 /hpf   RBC, Urine 0-2 0 - 2 /hpf   Epithelial Cells (non renal) 0-10 0 - 10 /hpf   Bacteria, UA None seen None seen/Few  Urinalysis, Complete  Result Value Ref Range   Specific Gravity, UA 1.010 1.005 - 1.030   pH, UA 7.0 5.0 - 7.5   Color, UA Yellow Yellow   Appearance Ur Clear Clear   Leukocytes,UA Negative Negative   Protein,UA Negative Negative/Trace   Glucose, UA Negative Negative   Ketones, UA Negative Negative   RBC, UA Negative Negative   Bilirubin, UA Negative Negative   Urobilinogen, Ur 0.2 0.2 - 1.0 mg/dL   Nitrite, UA Negative Negative   Microscopic Examination See below:    Pertinent Imaging: CT RENAL STONE STUDY  Narrative CLINICAL DATA:  Pelvic and bilateral flank pain. History of renal stones.  EXAM: CT ABDOMEN AND PELVIS WITHOUT CONTRAST  TECHNIQUE: Multidetector CT imaging  of the abdomen and pelvis was performed following the standard protocol without IV contrast.  RADIATION DOSE REDUCTION: This exam was performed according to the departmental dose-optimization program which includes automated exposure control, adjustment of the mA and/or kV according to patient size and/or use of iterative reconstruction technique.  COMPARISON:  December 25, 2020.  FINDINGS: Lower chest: No acute abnormality.  Hepatobiliary: Unremarkable noncontrast enhanced appearance of the hepatic parenchyma. Gallbladder is unremarkable. No biliary ductal dilation  Pancreas: Periampullary duodenal diverticulum. No pancreatic ductal dilation or evidence of acute inflammation.  Spleen: No splenomegaly.  Adrenals/Urinary  Tract: Bilateral adrenal glands appear normal. 3.3 cm left renal sinus cyst is stable and considered benign requiring no independent imaging follow-up. No hydronephrosis. No renal, ureteral or bladder calculi. Urinary bladder is unremarkable for degree of distension  Stomach/Bowel: No radiopaque enteric contrast material was administered. Stomach is unremarkable for degree of distension. No pathologic dilation of small or large bowel. No evidence of acute bowel inflammation.  Vascular/Lymphatic: Normal caliber abdominal aorta. Smooth IVC contours. No pathologically enlarged abdominal or pelvic lymph nodes.  Reproductive: Status post hysterectomy. No adnexal masses.  Other: No significant abdominopelvic free fluid  Musculoskeletal: Multilevel degenerative changes spine. No acute osseous abnormality. Very mild degenerative spurring of the bilateral hips.  IMPRESSION: No acute abnormality in the abdomen or pelvis. Specifically, no evidence of obstructive uropathy.   Electronically Signed By: Dahlia Bailiff M.D. On: 01/19/2023 18:25   I personally reviewed the images referenced above and note no significant urologic findings.  Assessment & Plan:    1. Recurrent UTI UA bland today.  Continue vaginal estrogen cream.  Will continue to monitor.  No evidence of stone nidus on recent CT. - Urinalysis, Complete  2. OAB (overactive bladder) Severe, refractory OAB that is failed multiple treatments.  We discussed that unfortunately the only remaining third line therapy to offer is InterStim, however we have deferred this due to the electromagnetic fields in her workplace.  I offered her a second opinion, but she reasonably declined.  We discussed a trial of indwelling Foley catheter, however I am concerned that this may cause her bladder spasms and urinary leakage around the catheter tubing and may not be a good solution either.  She reasonably declined this as well.   Return if symptoms worsen or fail to improve.  Debroah Loop, PA-C  [facility] [address] [place], [place] [address] [phone]

## 2023-02-13 ENCOUNTER — Encounter: Payer: Self-pay | Admitting: Internal Medicine

## 2023-02-13 ENCOUNTER — Telehealth: Payer: Self-pay

## 2023-02-13 NOTE — Telephone Encounter (Signed)
.  left message to have patient return my call.  

## 2023-02-13 NOTE — Telephone Encounter (Signed)
Pt LM on triage line and states she would like a call back from [place].

## 2023-02-14 NOTE — Telephone Encounter (Signed)
Pt is trying to get full disability for her bladder (appt 03/06/23 with disability) if she has to get interstim. Pt will need a letter from the rep stating that she can't work with interstim. Pt works at a [employer]. Pt will also be requesting full records for the disability office.  ( Those are the questions she had)

## 2023-02-15 ENCOUNTER — Encounter: Payer: Self-pay | Admitting: *Deleted

## 2023-02-15 NOTE — Telephone Encounter (Signed)
Responded via MyChart. I received documentation from [employer] regarding InterStim and non-compatibility with electromagnetic fields and printed these documents for the patient. She will come in to sign medical records release.

## 2023-02-20 ENCOUNTER — Telehealth: Payer: Self-pay | Admitting: Internal Medicine

## 2023-02-20 NOTE — Telephone Encounter (Signed)
Copied from [place] [id number]. Topic: General - Other >> Feb 17, 2023  4:32 PM Rose Bernard wrote: Reason for CRM: The patient has called to confirm that their records release has been received   Please contact the patient further after 3 PM.   Can you please see what needs to be done.  Thanks.

## 2023-02-23 NOTE — Telephone Encounter (Signed)
Pt called back regarding her records

## 2023-02-28 ENCOUNTER — Encounter: Payer: Self-pay | Admitting: Internal Medicine

## 2023-02-28 ENCOUNTER — Ambulatory Visit (INDEPENDENT_AMBULATORY_CARE_PROVIDER_SITE_OTHER): Payer: BC Managed Care – PPO | Admitting: Physician Assistant

## 2023-02-28 VITALS — BP 147/76 | HR 65 | Ht 67.0 in | Wt 157.0 lb

## 2023-02-28 DIAGNOSIS — N390 Urinary tract infection, site not specified: Secondary | ICD-10-CM | POA: Diagnosis not present

## 2023-02-28 DIAGNOSIS — R102 Pelvic and perineal pain: Secondary | ICD-10-CM

## 2023-02-28 DIAGNOSIS — R300 Dysuria: Secondary | ICD-10-CM | POA: Diagnosis not present

## 2023-02-28 DIAGNOSIS — M549 Dorsalgia, unspecified: Secondary | ICD-10-CM

## 2023-02-28 MED ORDER — TRIMETHOPRIM 100 MG PO TABS: 100.0000 mg | ORAL_TABLET | Freq: Every day | ORAL | 2 refills | Status: DC

## 2023-02-28 NOTE — Progress Notes (Signed)
02/28/2023 4:09 PM   Rose Bernard 1966-02-12 [id number]  CC: Chief Complaint  Patient presents with   Follow-up   HPI: Rose Bernard is a 58 y.o. 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  Today she reports a 5-day history of back pain radiating down the BLEs, groin pain, and dysuria including shooting pelvic pains.  She has been taking Aleve, but it is not particularly helping.  In-office UA today positive for trace leukocytes; urine microscopy with 6-10 WBCs/HPF.  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 Hysterectomy and bladder repair with subsequent appendectomy. L Ovary remain in place    APPENDECTOMY     2001 due to scar tissue   BLADDER BOTOX  2014, 2015, 2016   BREAST BIOPSY Bilateral 1991   BREAST BIOPSY Left 12/12/2019   [place] bx, path pending   BREAST LUMPECTOMY     BREAST SURGERY Bilateral 2002   benign mass removed   COLONOSCOPY  2014   EXCISION OF BREAST BIOPSY Left 01/10/2020   Procedure: EXCISION OF BREAST BIOPSY w/ Needle localization;  Surgeon: Herbert Pun, MD;  Location: [facility] ORS;  Service: General;  Laterality: Left;   FOOT SURGERY Bilateral    [place], PARTIAL Left 01/31/2020   Procedure: MASTECTOMY PARTIAL;  Surgeon: Herbert Pun, MD;  Location: [facility] ORS;  Service: General;  Laterality: Left;    reconstruction of bladder   1995   2/2 delivery complications with 3rd child     Home Medications:  Allergies as of 02/28/2023       Reactions   Penicillin V Potassium Rash   Did it involve swelling of the face/tongue/throat, SOB, or low BP? No Did it involve sudden or severe rash/hives, skin peeling, or any reaction on the inside of your mouth or nose? No Did you need to seek medical attention at a hospital or doctor's office? No When did it last happen?      25 years ago If all above answers are "NO", may proceed with cephalosporin use.        Medication List        Accurate as of February 28, 2023  4:09 PM. If you have any questions, ask your nurse or doctor.          Azelastine HCl 137 MCG/SPRAY Soln Place 1 spray into both nostrils 2 (two) times daily.   estradiol 0.1 MG/GM vaginal cream Commonly known as: ESTRACE Apply one pea-sized amount around the opening of the urethra daily for 2 weeks, then 3 times weekly moving forward.   fluticasone 50 MCG/ACT nasal spray Commonly known as: FLONASE Place 2 sprays into both nostrils daily.   trimethoprim 100 MG tablet Commonly known as: TRIMPEX Take 1 tablet (100 mg total) by mouth daily.   vitamin E 1000 UNIT capsule Take 1,000 Units by mouth daily.  Allergies:  Allergies  Allergen Reactions   Penicillin V Potassium Rash    Did it involve swelling of the face/tongue/throat, SOB, or low BP? No Did it involve sudden or severe rash/hives, skin peeling, or any reaction on the inside of your mouth or nose? No Did you need to seek medical attention at a hospital or doctor's office? No When did it last happen?      25 years ago If all above answers are "NO", may proceed with cephalosporin use.     Family History: Family History  Problem Relation Age of Onset   Colon cancer Father        With ostomy in place    Breast cancer Paternal Aunt 48   Breast cancer Maternal Aunt    Breast cancer Maternal Aunt      Social History:   reports that she has never smoked. She has never been exposed to tobacco smoke. She has never used smokeless tobacco. She reports that she does not drink alcohol and does not use drugs.  Physical Exam: BP (!) 147/76   Pulse 65   Ht 5\' 7"  (1.702 m)   Wt 157 lb (71.2 kg)   LMP 02/07/1994   BMI 24.59 kg/m   Constitutional:  Alert and oriented, no acute distress, nontoxic appearing HEENT: [place], AT Cardiovascular: No clubbing, cyanosis, or edema Respiratory: Normal respiratory effort, no increased work of breathing Skin: No rashes, bruises or suspicious lesions Neurologic: Grossly intact, no focal deficits, moving all 4 extremities Psychiatric: Normal mood and affect  Laboratory Data: See Epic.   Assessment & Plan:   1. Recurrent UTI UA today is rather bland, suspect pain more likely musculoskeletal in origin today.  Will send for culture to confirm and treat as indicated.  We discussed starting suppressive antibiotics for UTI prevention and she is in agreement.  Will start with 3 months of trimethoprim and continue based on clinical course. - Urinalysis, Complete - CULTURE, URINE COMPREHENSIVE - trimethoprim (TRIMPEX) 100 MG tablet; Take 1 tablet (100 mg total) by mouth daily.  Dispense: 30 tablet; Refill: 2  Return for Will call with results.  Debroah Loop, PA-C  [facility] [address], [place], [place] [address] [phone]

## 2023-03-01 LAB — URINALYSIS, COMPLETE
Bilirubin, UA: NEGATIVE
Glucose, UA: NEGATIVE
Ketones, UA: NEGATIVE
Nitrite, UA: NEGATIVE
Protein,UA: NEGATIVE
RBC, UA: NEGATIVE
Specific Gravity, UA: 1.015 (ref 1.005–1.030)
Urobilinogen, Ur: 0.2 mg/dL (ref 0.2–1.0)
pH, UA: 6.5 (ref 5.0–7.5)

## 2023-03-01 LAB — MICROSCOPIC EXAMINATION

## 2023-03-04 LAB — CULTURE, URINE COMPREHENSIVE

## 2023-03-06 NOTE — Telephone Encounter (Signed)
Spoke with patient. She is in the process of trying to get disability and dealing with HIM to get her records. Patient confirmed that she has received her records. Nothing further needed at this time.

## 2023-03-07 ENCOUNTER — Other Ambulatory Visit: Payer: Self-pay | Admitting: *Deleted

## 2023-03-07 ENCOUNTER — Telehealth: Payer: Self-pay | Admitting: *Deleted

## 2023-03-07 MED ORDER — NITROFURANTOIN MONOHYD MACRO 100 MG PO CAPS: 100.0000 mg | ORAL_CAPSULE | Freq: Two times a day (BID) | ORAL | 0 refills | Status: AC

## 2023-03-07 NOTE — Telephone Encounter (Signed)
Urine culture did grow a small volume of bacteria. Let's treat with Macrobid 100mg  BID x5 days. She should stop suppressive trimethoprim while on Macrobid and can resume it the day after she finishes the [place].    Notified patient as instructed, patient pleased. Discussed follow-up appointments

## 2023-03-13 ENCOUNTER — Other Ambulatory Visit (INDEPENDENT_AMBULATORY_CARE_PROVIDER_SITE_OTHER): Payer: BC Managed Care – PPO

## 2023-03-13 ENCOUNTER — Ambulatory Visit (INDEPENDENT_AMBULATORY_CARE_PROVIDER_SITE_OTHER): Payer: BC Managed Care – PPO | Admitting: Urology

## 2023-03-13 VITALS — BP 125/72 | HR 55 | Ht 67.0 in | Wt 157.0 lb

## 2023-03-13 DIAGNOSIS — Z13220 Encounter for screening for lipoid disorders: Secondary | ICD-10-CM

## 2023-03-13 DIAGNOSIS — Z87440 Personal history of urinary (tract) infections: Secondary | ICD-10-CM

## 2023-03-13 DIAGNOSIS — N390 Urinary tract infection, site not specified: Secondary | ICD-10-CM | POA: Diagnosis not present

## 2023-03-13 DIAGNOSIS — N3281 Overactive bladder: Secondary | ICD-10-CM

## 2023-03-13 DIAGNOSIS — N3946 Mixed incontinence: Secondary | ICD-10-CM

## 2023-03-13 DIAGNOSIS — D649 Anemia, unspecified: Secondary | ICD-10-CM | POA: Diagnosis not present

## 2023-03-13 LAB — CBC WITH DIFFERENTIAL/PLATELET
Basophils Absolute: 0 10*3/uL (ref 0.0–0.1)
Basophils Relative: 1.3 % (ref 0.0–3.0)
Eosinophils Absolute: 0.1 10*3/uL (ref 0.0–0.7)
Eosinophils Relative: 2.1 % (ref 0.0–5.0)
HCT: 39.5 % (ref 36.0–46.0)
Hemoglobin: 13.3 g/dL (ref 12.0–15.0)
Lymphocytes Relative: 41.2 % (ref 12.0–46.0)
Lymphs Abs: 1.6 10*3/uL (ref 0.7–4.0)
MCHC: 33.5 g/dL (ref 30.0–36.0)
MCV: 90.5 fl (ref 78.0–100.0)
Monocytes Absolute: 0.4 10*3/uL (ref 0.1–1.0)
Monocytes Relative: 11 % (ref 3.0–12.0)
Neutro Abs: 1.7 10*3/uL (ref 1.4–7.7)
Neutrophils Relative %: 44.4 % (ref 43.0–77.0)
Platelets: 309 10*3/uL (ref 150.0–400.0)
RBC: 4.37 Mil/uL (ref 3.87–5.11)
RDW: 13.8 % (ref 11.5–15.5)
WBC: 3.8 10*3/uL — ABNORMAL LOW (ref 4.0–10.5)

## 2023-03-13 LAB — BASIC METABOLIC PANEL
BUN: 17 mg/dL (ref 6–23)
CO2: 28 mEq/L (ref 19–32)
Calcium: 8.8 mg/dL (ref 8.4–10.5)
Chloride: 106 mEq/L (ref 96–112)
Creatinine, Ser: 0.72 mg/dL (ref 0.40–1.20)
GFR: 92.99 mL/min (ref >60.00)
Glucose, Bld: 90 mg/dL (ref 70–99)
Potassium: 3.6 mEq/L (ref 3.5–5.1)
Sodium: 140 mEq/L (ref 135–145)

## 2023-03-13 LAB — LIPID PANEL
Cholesterol: 211 mg/dL — ABNORMAL HIGH (ref 0–200)
HDL: 70.8 mg/dL (ref >39.00)
LDL Cholesterol: 126 mg/dL — ABNORMAL HIGH (ref 0–99)
NonHDL: 140.34
Total CHOL/HDL Ratio: 3
Triglycerides: 74 mg/dL (ref 0.0–149.0)
VLDL: 14.8 mg/dL (ref 0.0–40.0)

## 2023-03-13 LAB — HEPATIC FUNCTION PANEL
ALT: 12 U/L (ref 0–35)
AST: 14 U/L (ref 0–37)
Albumin: 4.3 g/dL (ref 3.5–5.2)
Alkaline Phosphatase: 85 U/L (ref 39–117)
Bilirubin, Direct: 0.1 mg/dL (ref 0.0–0.3)
Total Bilirubin: 0.6 mg/dL (ref 0.2–1.2)
Total Protein: 6.7 g/dL (ref 6.0–8.3)

## 2023-03-13 LAB — TSH: TSH: 1.77 u[IU]/mL (ref 0.35–5.50)

## 2023-03-13 MED ORDER — NITROFURANTOIN MONOHYD MACRO 100 MG PO CAPS: 100.0000 mg | ORAL_CAPSULE | Freq: Every day | ORAL | 0 refills | Status: DC

## 2023-03-13 MED ORDER — NITROFURANTOIN MONOHYD MACRO 100 MG PO CAPS: 100.0000 mg | ORAL_CAPSULE | Freq: Every day | ORAL | 3 refills | Status: DC

## 2023-03-13 NOTE — Addendum Note (Signed)
Addended byMariam Dollar on: 03/13/2023 09:13 AM   Modules accepted: Orders

## 2023-03-13 NOTE — Progress Notes (Addendum)
03/13/2023 8:41 AM   [name] [birth year] [id number]  Referring provider: Einar Pheasant, [place] [id number] [place],  [place] [address]  Chief Complaint  Patient presents with   Follow-up    HPI: Reviewed the extensive history from December 27, 2021.  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  She has failed Botox.  She has been on Gemtesa and percutaneous tibial nerve stimulation.  Following nurse practitioner in April with worsening symptoms.  Culture was normal on that day   Patient no longer does PTNS or takes [place]  She had pressure in April but a negative culture.  She had little bit of pressure yesterday and took some cranberry.  She has some bedwetting but is not nightly.  She has urge incontinence during the day but does not wear a pad.  Clinically I do not think she has infection    Urine sent for culture. I think we have reached the end of the treatment pathway. I will see her as needed   Chart review Dr Erlene Quan: Patient has refractory overactive bladder treated with Botox treatments.  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  She has urinary tract infections.  By history she has tried multiple medications and failed.   Patient has had approximately 2 Botox treatments for about 8 to 10 years.  The last 2 did not work well.  Years ago she was 90% improved and now they are less effective gradually.  Currently she has urge incontinence.  She leaks with coughing sneezing and when she lifts at work.  Both are significant.  She wears 2 pads a day that are soaked.  She has high-volume bedwetting.   She voids every hour depending on fluid intake and cannot hold urination for 2 hours.  She gets up to 3 times a night.  Flow was reasonable   It appears that she had a bladder injury during her childbirth with the reimplantation but has not had a bladder suspension.  She used to get  bladder infections   Patient has mixed incontinence.  She also has stress incontinence but it does appear that the urge component is more severe especially with the bedwetting.   Patient was inquiring about InterStim but understands why the test was ordered   On urodynamics patient voided 64 mils of maximal flow of 36 mils per second.  Residual was 50 mils.  Maximum bladder capacity was 327 mL.  Bladder was stable.  There was no stress incontinence with a Valsalva pressure of 71 cm of water.  During voluntary voiding she voided 133 mL with a maximum flow of 5 mils per second.  Maximum voiding pressure 5 cm of water.  Residual 193 mL.  EMG mildly increased during the voiding phase.  She said she leaks more when she is standing up.  Uroflow pattern depressed and prolonged.     Ankles are healthy appearing with good vascular supply.  No edema.  Few peripheral veins.  No neuropathy.  No steroids.  Importantly she had a blood clot in her right leg years ago.  If she has an ankle implant we would use the left     She has refractory urgency incontinence.  She may have mild stress incontinence not demonstrated on urodynamics.  She has failed Botox.  She has failed a number of medications over the years.  We talked about sacral nerve stimulation with usual template.  Handout of percutaneous tibial nerve stimulation and InterStim given.    I talked about the new FDA approved ankle implant and she does have private insurance that is changing.  We talked about efficacy and risks including infection, wound issues, pain, migration and sequelae, battery life 3 years approximate and changing hopefully to 5 years, MRI issues and activity issues.  I talked about the study and she understands that the battery would be rechargeable.  She would like to be called regarding it.  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  There is one machine that if she has a foreign body with metal in her body  she would need a letter saying that she cannot go in it with either InterStim or ankle implant.  She would need to be off for approximately 2 weeks to avoid heavy lifting and working she does at work.  We would use left leg as noted  Patient had called in July stating she could not do the InterStim because of the work issue noted above.  She was treated with percutaneous tibial nerve stimulation.   Patient is doing the treatments and she thinks they helped some but not much if she drinks a lot of fluids like she like to.  She came in today with some diffuse nonspecific high back pain on the right and left.  Urine may be a little bit cloudy but not foul-smelling.  No particular increase in frequency.  Has had a kidney stone in the past.  Last 2 cultures negative.  No kidney stone on CT scan December 25, 2020     Treatment options limited for refractory overactive bladder.  Likely does not have a bladder infection.  Call if her culture differs.  Role of Gemtesa discussed.  She did find out she cannot have a cardiac pacemaker and therefore cannot have InterStim at her job.  I gave her the Gemtesa samples.  Continue with percutaneous tibial nerve stimulation.  Urine looked normal but call if culture positive.  Only treat if positive.    Today Frequency stable.  Culture was not sent last time.  Urine today looked good but was sent for culture. Patient is dramatically better on Gemtesa.  Rarely leaks during the day.  Rarely leaks at night.  It is $250 for 90 days.  I will give her 3 months of samples every 6 months to cut the cost in half.  Today Patient saw a nurse practitioner number times with multiple positive cultures and was put on daily trimethoprim February 28, 2023  Patient just finished Macrobid and clinically is infection free.  She will try to leave another sample and sent for culture.  She understands her back pain is not from her kidneys.  She has been getting recurrent cystitis with frequency  worsening urge incontinence and lower pelvic discomfort since November of last year  When she is not infected she has urge incontinence wearing 1 or 2 pads a day moderately wet significant impacting her quality life.  She is reducing fluids.  She has failed behavioral medical therapy.  She has mild bedwetting.  She was getting a little bit of nausea from the trimethoprim that she took for few days before she was treated with Macrobid.     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 Hysterectomy and bladder repair with subsequent appendectomy. L Ovary remain in place    APPENDECTOMY     2001 due to scar tissue   BLADDER BOTOX  2014, 2015, 2016   BREAST BIOPSY Bilateral 1991   BREAST BIOPSY Left 12/12/2019   [place] bx, path pending   BREAST LUMPECTOMY     BREAST SURGERY Bilateral 2002   benign mass removed   COLONOSCOPY  2014   EXCISION OF BREAST BIOPSY Left 01/10/2020   Procedure: EXCISION OF BREAST BIOPSY w/ Needle localization;  Surgeon: Herbert Pun, MD;  Location: [facility] ORS;  Service: General;  Laterality: Left;   FOOT SURGERY Bilateral    [place], PARTIAL Left 01/31/2020   Procedure: MASTECTOMY PARTIAL;  Surgeon: Herbert Pun, MD;  Location: [facility] ORS;  Service: General;  Laterality: Left;   reconstruction of bladder   1995   2/2 delivery complications with 3rd child     Home Medications:  Allergies as of 03/13/2023       Reactions   Penicillin V Potassium Rash   Did it involve swelling of the  face/tongue/throat, SOB, or low BP? No Did it involve sudden or severe rash/hives, skin peeling, or any reaction on the inside of your mouth or nose? No Did you need to seek medical attention at a hospital or doctor's office? No When did it last happen?      25 years ago If all above answers are "NO", may proceed with cephalosporin use.        Medication List        Accurate as of March 13, 2023  8:41 AM. If you have any questions, ask your nurse or doctor.          Azelastine HCl 137 MCG/SPRAY Soln Place 1 spray into both nostrils 2 (two) times daily.   estradiol 0.1 MG/GM vaginal cream Commonly known as: ESTRACE Apply one pea-sized amount around the opening of the urethra daily for 2 weeks, then 3 times weekly moving forward.   fluticasone 50 MCG/ACT nasal spray Commonly known as: FLONASE Place 2 sprays into both nostrils daily.   trimethoprim 100 MG tablet Commonly known as: TRIMPEX Take 1 tablet (100 mg total) by mouth daily.   vitamin E 1000 UNIT capsule Take 1,000 Units by mouth daily.        Allergies:  Allergies  Allergen Reactions   Penicillin V Potassium Rash    Did it involve swelling of the face/tongue/throat, SOB, or low BP? No Did it involve sudden or severe rash/hives, skin peeling, or any reaction on the inside of your mouth or nose? No Did you need to seek medical attention at a hospital or doctor's office? No When did it last happen?      25 years ago If all above answers are "NO", may proceed with cephalosporin use.     Family History: Family History  Problem Relation Age of Onset   Colon cancer Father        With ostomy in place    Breast cancer Paternal Aunt 68   Breast cancer Maternal Aunt    Breast cancer Maternal Aunt     Social History:  reports that she has never smoked. She has never been exposed to tobacco smoke. She has never used smokeless tobacco. She reports that she does not drink alcohol and does not use drugs.  ROS:                                         Physical Exam: BP 125/72   Pulse (!) 55   Ht 5\' 7"  (1.702 m)   Wt 71.2 kg   LMP 02/07/1994   BMI 24.59 kg/m   Constitutional:  Alert and oriented, No acute distress.  Laboratory Data: Lab Results  Component Value Date   WBC 4.5 10/14/2022   HGB 12.5 10/14/2022   HCT 38.9 10/14/2022   MCV 92.4 10/14/2022   PLT 242 10/14/2022    Lab Results  Component Value Date   CREATININE 0.73 10/14/2022    No results found for: "PSA"  No results found for: "TESTOSTERONE"  Lab Results  Component Value Date   HGBA1C 5.6 10/01/2015    Urinalysis    Component Value Date/Time   COLORURINE YELLOW 01/26/2023 1555   APPEARANCEUR Hazy (A) 02/28/2023 1539   LABSPEC 1.015 01/26/2023 1555   PHURINE 6.0 01/26/2023 1555   GLUCOSEU Negative 02/28/2023 1539   GLUCOSEU NEGATIVE 01/26/2023 1555   [place] 01/26/2023 1555   BILIRUBINUR Negative 02/28/2023 1539   KETONESUR NEGATIVE 01/26/2023 1555   PROTEINUR Negative 02/28/2023 1539   UROBILINOGEN 0.2 01/26/2023 1555   NITRITE Negative 02/28/2023 1539   NITRITE NEGATIVE 01/26/2023 1555   LEUKOCYTESUR Trace (A) 02/28/2023 1539   LEUKOCYTESUR NEGATIVE 01/26/2023 1555    Pertinent Imaging:   Assessment & Plan: Patient having recurrent bladder infections.  I will call if she is able to give a urine culture with the results.  She will be started on daily Macrobid 90 x 3 sent to mail-order in 1 month supply sent to pharmacy.  I was very clear on differentiating her recurrent urinary tract infections with pelvic discomfort and worsening incontinence from her refractory overactive bladder.  She says the refractory overactive bladder when she is not infected is significantly impacting her quality life and it has as well in the past.  She is hoping to retire and she may go on short-term disability and wants to proceed with InterStim.  I went over InterStim with full template detail  again.  I will do the test in [place] and we can do the implant here in [place] and this was discussed.  She understands the InterStim will not impact back pain or her UTIs and they are separate issues.  We talked about staying on antibiotics for a few months and see how much her symptoms down regulate but she want to proceed as planned I thought this was very reasonable in her case.  The patient's incontinence severity and using InterStim was discussed but she said it is dramatically affecting her quality life and she reduce his fluids because of it  There are no diagnoses linked to this encounter.  No follow-ups on file.  Reece Packer, MD  [facility] [address] [place], [place] [address] [phone]

## 2023-03-14 ENCOUNTER — Encounter: Payer: Self-pay | Admitting: Internal Medicine

## 2023-03-14 NOTE — Telephone Encounter (Signed)
Should have copy of old FMLA

## 2023-03-15 ENCOUNTER — Telehealth: Payer: Self-pay

## 2023-03-15 ENCOUNTER — Encounter: Payer: Self-pay | Admitting: Urology

## 2023-03-15 DIAGNOSIS — D72819 Decreased white blood cell count, unspecified: Secondary | ICD-10-CM

## 2023-03-15 NOTE — Telephone Encounter (Signed)
Patient dropped off document FMLA, to be filled out by provider. Patient requested to send it via Fax within 5-days. Document is located in providers tray at front office(color folder)Please advise at Mobile [phone] (mobile). Pt mentioned that theres a fax number in there to be fax back.

## 2023-03-15 NOTE — Telephone Encounter (Signed)
LMTCB. Ok to give results. Needs non fasting lab in 3-4 weeks. Lab ordered.

## 2023-03-15 NOTE — Telephone Encounter (Signed)
-----   Message from Einar Pheasant, MD sent at 03/13/2023  9:49 PM EDT ----- Notify - cholesterol has improved from last check.  White blood cell count is slightly decreased.  This can be a norma variant. We will follow.  Recheck cbc (dx leukopenia) - in 3-4 weeks.  Thyroid test, kidney function tests and liver function tests are wnl.

## 2023-03-16 LAB — CULTURE, URINE COMPREHENSIVE

## 2023-03-17 DIAGNOSIS — Z0279 Encounter for issue of other medical certificate: Secondary | ICD-10-CM

## 2023-03-17 NOTE — Telephone Encounter (Signed)
Ok

## 2023-03-17 NOTE — Telephone Encounter (Signed)
FMLA completed but need to confirm you are okay with writing 8 hr for flare ups. We specified 3-4 hours for appointments last year.

## 2023-03-20 ENCOUNTER — Encounter: Payer: Self-pay | Admitting: Internal Medicine

## 2023-03-23 ENCOUNTER — Telehealth: Payer: Self-pay

## 2023-03-23 NOTE — Telephone Encounter (Signed)
Noted  

## 2023-03-23 NOTE — Telephone Encounter (Signed)
Pt returned [place] LPN call. Note below was read to her. Pt aware. As per pt, she needs a copy of paperwork to be mail to her.

## 2023-03-23 NOTE — Telephone Encounter (Signed)
Faxed her FMLA paperwork to [facility] source. LM for patient to let her know. Also need to know if she wants a copy of her paperwork.

## 2023-03-27 ENCOUNTER — Other Ambulatory Visit: Payer: Self-pay

## 2023-03-27 ENCOUNTER — Encounter: Payer: Self-pay | Admitting: Internal Medicine

## 2023-04-05 ENCOUNTER — Other Ambulatory Visit (INDEPENDENT_AMBULATORY_CARE_PROVIDER_SITE_OTHER): Payer: BC Managed Care – PPO

## 2023-04-05 DIAGNOSIS — D72819 Decreased white blood cell count, unspecified: Secondary | ICD-10-CM

## 2023-04-06 LAB — CBC WITH DIFFERENTIAL/PLATELET
Basophils Absolute: 0.1 10*3/uL (ref 0.0–0.1)
Basophils Relative: 1.2 % (ref 0.0–3.0)
Eosinophils Absolute: 0.1 10*3/uL (ref 0.0–0.7)
Eosinophils Relative: 1.8 % (ref 0.0–5.0)
HCT: 36.9 % (ref 36.0–46.0)
Hemoglobin: 12.5 g/dL (ref 12.0–15.0)
Lymphocytes Relative: 40.5 % (ref 12.0–46.0)
Lymphs Abs: 2.3 10*3/uL (ref 0.7–4.0)
MCHC: 33.9 g/dL (ref 30.0–36.0)
MCV: 91.1 fl (ref 78.0–100.0)
Monocytes Absolute: 0.5 10*3/uL (ref 0.1–1.0)
Monocytes Relative: 8.6 % (ref 3.0–12.0)
Neutro Abs: 2.7 10*3/uL (ref 1.4–7.7)
Neutrophils Relative %: 47.9 % (ref 43.0–77.0)
Platelets: 283 10*3/uL (ref 150.0–400.0)
RBC: 4.05 Mil/uL (ref 3.87–5.11)
RDW: 13.2 % (ref 11.5–15.5)
WBC: 5.6 10*3/uL (ref 4.0–10.5)

## 2023-04-11 ENCOUNTER — Telehealth (INDEPENDENT_AMBULATORY_CARE_PROVIDER_SITE_OTHER): Payer: Self-pay | Admitting: Vascular Surgery

## 2023-04-11 NOTE — Telephone Encounter (Signed)
Spoke with pt regarding prior auth for laser ablation. There is no PA required, however, the patients last day of work is today. So she will be getting "new" retirement insurance. She wants to wait until she gets that information to me and I will check for a PA at that time with the new insurance. Patient states she will call me when she has the new information for me. Nothing further is needed at this time.

## 2023-05-02 ENCOUNTER — Telehealth: Payer: Self-pay

## 2023-05-02 NOTE — Telephone Encounter (Signed)
Incoming call from company "[employer]" they are calling to ensure we have received the patient's forms for disability. Informed call that forms have been received and is at the discretion of the provider if he will approve.

## 2023-05-23 DIAGNOSIS — N3946 Mixed incontinence: Secondary | ICD-10-CM | POA: Diagnosis not present

## 2023-05-23 DIAGNOSIS — N3941 Urge incontinence: Secondary | ICD-10-CM | POA: Diagnosis not present

## 2023-05-29 ENCOUNTER — Encounter: Payer: Self-pay | Admitting: Internal Medicine

## 2023-05-29 ENCOUNTER — Ambulatory Visit (INDEPENDENT_AMBULATORY_CARE_PROVIDER_SITE_OTHER): Payer: BC Managed Care – PPO | Admitting: Internal Medicine

## 2023-05-29 VITALS — BP 122/70 | HR 83 | Temp 97.9°F | Resp 16 | Ht 67.0 in | Wt 155.6 lb

## 2023-05-29 DIAGNOSIS — Z800 Family history of malignant neoplasm of digestive organs: Secondary | ICD-10-CM

## 2023-05-29 DIAGNOSIS — D649 Anemia, unspecified: Secondary | ICD-10-CM

## 2023-05-29 DIAGNOSIS — I872 Venous insufficiency (chronic) (peripheral): Secondary | ICD-10-CM

## 2023-05-29 DIAGNOSIS — F439 Reaction to severe stress, unspecified: Secondary | ICD-10-CM

## 2023-05-29 DIAGNOSIS — N390 Urinary tract infection, site not specified: Secondary | ICD-10-CM

## 2023-05-29 DIAGNOSIS — Z13220 Encounter for screening for lipoid disorders: Secondary | ICD-10-CM

## 2023-05-29 DIAGNOSIS — N3020 Other chronic cystitis without hematuria: Secondary | ICD-10-CM

## 2023-05-29 DIAGNOSIS — D0512 Intraductal carcinoma in situ of left breast: Secondary | ICD-10-CM | POA: Diagnosis not present

## 2023-05-29 DIAGNOSIS — R350 Frequency of micturition: Secondary | ICD-10-CM

## 2023-05-29 DIAGNOSIS — Z1211 Encounter for screening for malignant neoplasm of colon: Secondary | ICD-10-CM

## 2023-05-29 NOTE — Progress Notes (Signed)
Subjective:    Patient ID: Rose Bernard, female    DOB: 03-01-1966, 57 y.o.   MRN: [id number]  Patient here for  Chief Complaint  Patient presents with   Medical Management of Chronic Issues    HPI Here for a scheduled follow up.  Has had recurring bladder issues and recurring UTIs. Saw urology 03/13/23 - started on daily macrobid.  Interested in InterStim.  Test - has helped.  Plans to f/u with urology.  She has retired. Taking care of her aunt now.  No chest pain.  Breathing stable.  No abdominal pain.  Handling stress.    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 Hysterectomy and bladder repair with subsequent appendectomy. L Ovary remain in place    APPENDECTOMY     2001 due to scar tissue   BLADDER BOTOX  2014, 2015, 2016   BREAST BIOPSY Bilateral 1991   BREAST BIOPSY Left 12/12/2019   [place] bx, path pending   BREAST LUMPECTOMY     BREAST SURGERY Bilateral 2002   benign mass removed   COLONOSCOPY  2014   EXCISION OF BREAST BIOPSY Left 01/10/2020   Procedure: EXCISION OF BREAST BIOPSY w/ Needle localization;  Surgeon: Carolan Shiver, MD;  Location: [facility] OR[facility];  Service: General;  Laterality: Left;   FOOT SURGERY Bilateral    LAPAROSCOPIC HYSTERECTOMY  1995   MASTECTOMY, PARTIAL Left 01/31/2020   Procedure: MASTECTOMY PARTIAL;  Surgeon: Carolan Shiver, MD;  Location: [facility];  Service: General;  Laterality: Left;   reconstruction of bladder   1995   2/2 delivery complications with 3rd child    Family History  Problem Relation Age of Onset   Colon cancer Father         With ostomy in place    Breast cancer Paternal Aunt 50   Breast cancer Maternal Aunt    Breast cancer Maternal Aunt    Social History   Socioeconomic History   Marital status: Married    Spouse name: Not on file   Number of children: 2   Years of education: Not on file   Highest education level: Not on file  Occupational History   Not on file  Tobacco Use   Smoking status: Never    Passive exposure: Never   Smokeless tobacco: Never  Vaping Use   Vaping Use: Never used  Substance and Sexual Activity   Alcohol use: No    Alcohol/week: 0.0 standard drinks of alcohol   Drug use: No   Sexual activity: Yes    Birth control/protection: Surgical  Other Topics Concern   Not on file  Social History Narrative   Not on file   Social Determinants of Health   Financial Resource Strain: Not on file  Food Insecurity: Not on file  Transportation Needs: Not on file  Physical Activity: Not on file  Stress: Not on file  Social Connections: Not on file     Review of Systems  Constitutional:  Negative for appetite change and unexpected weight change.  HENT:  Negative for congestion  and sinus pressure.   Respiratory:  Negative for cough, chest tightness and shortness of breath.   Cardiovascular:  Negative for chest pain and palpitations.       No increased swelling.   Gastrointestinal:  Negative for abdominal pain, diarrhea, nausea and vomiting.  Genitourinary:        Urinary issues as outlined.   Musculoskeletal:  Negative for joint swelling and myalgias.  Skin:  Negative for color change and rash.  Neurological:  Negative for dizziness and headaches.  Psychiatric/Behavioral:  Negative for agitation and dysphoric mood.        Objective:     BP 122/70   Pulse 83   Temp 97.9 F (36.6 C)   Resp 16   Ht 5\' 7"  (1.702 m)   Wt 155 lb 9.6 oz (70.6 kg)   LMP 02/07/1994   SpO2 98%   BMI 24.37 kg/m  Wt Readings from Last 3 Encounters:  05/29/23 155 lb 9.6 oz (70.6 kg)   03/13/23 157 lb (71.2 kg)  02/28/23 157 lb (71.2 kg)    Physical Exam Constitutional:      General: She is not in acute distress.    Appearance: Normal appearance.  HENT:     Head: Normocephalic and atraumatic.     Nose: Nose normal.     Mouth/Throat:     Pharynx: No oropharyngeal exudate or posterior oropharyngeal erythema.  Neck:     Thyroid: No thyromegaly.  Cardiovascular:     Rate and Rhythm: Normal rate and regular rhythm.  Pulmonary:     Effort: No respiratory distress.     Breath sounds: Normal breath sounds. No wheezing.  Abdominal:     General: Bowel sounds are normal.     Palpations: Abdomen is soft.     Tenderness: There is no abdominal tenderness.  Musculoskeletal:        General: No swelling or tenderness.     Cervical back: Neck supple. No tenderness.  Lymphadenopathy:     Cervical: No cervical adenopathy.  Skin:    Findings: No erythema or rash.  Neurological:     Mental Status: She is alert.  Psychiatric:        Mood and Affect: Mood normal.        Behavior: Behavior normal.      Outpatient Encounter Medications as of 05/29/2023  Medication Sig   Azelastine HCl 137 MCG/SPRAY SOLN Place 1 spray into both nostrils 2 (two) times daily.   estradiol (ESTRACE) 0.1 MG/GM vaginal cream Apply one pea-sized amount around the opening of the urethra daily for 2 weeks, then 3 times weekly moving forward.   fluticasone (FLONASE) 50 MCG/ACT nasal spray Place 2 sprays into both nostrils daily.   nitrofurantoin, macrocrystal-monohydrate, (MACROBID) 100 MG capsule Take 1 capsule (100 mg total) by mouth daily.   nitrofurantoin, macrocrystal-monohydrate, (MACROBID) 100 MG capsule Take 1 capsule (100 mg total) by mouth daily.   vitamin E 1000 UNIT capsule Take 1,000 Units by mouth daily.   No facility-administered encounter medications on file as of 05/29/2023.     Lab Results  Component Value Date   WBC 5.6 04/05/2023   HGB 12.5 04/05/2023   HCT 36.9 04/05/2023    PLT 283.0 04/05/2023   GLUCOSE 90 03/13/2023   CHOL 211 (H) 03/13/2023   TRIG 74.0 03/13/2023   HDL 70.80 03/13/2023   LDLCALC 126 (H) 03/13/2023   ALT 12 03/13/2023   AST 14 03/13/2023   NA 140 03/13/2023   K  3.6 03/13/2023   CL 106 03/13/2023   CREATININE 0.72 03/13/2023   BUN 17 03/13/2023   CO2 28 03/13/2023   TSH 1.77 03/13/2023   HGBA1C 5.6 10/01/2015    CT RENAL STONE STUDY  Result Date: 01/19/2023 CLINICAL DATA:  Pelvic and bilateral flank pain. History of renal stones. EXAM: CT ABDOMEN AND PELVIS WITHOUT CONTRAST TECHNIQUE: Multidetector CT imaging of the abdomen and pelvis was performed following the standard protocol without IV contrast. RADIATION DOSE REDUCTION: This exam was performed according to the departmental dose-optimization program which includes automated exposure control, adjustment of the mA and/or kV according to patient size and/or use of iterative reconstruction technique. COMPARISON:  December 25, 2020. FINDINGS: Lower chest: No acute abnormality. Hepatobiliary: Unremarkable noncontrast enhanced appearance of the hepatic parenchyma. Gallbladder is unremarkable. No biliary ductal dilation Pancreas: Periampullary duodenal diverticulum. No pancreatic ductal dilation or evidence of acute inflammation. Spleen: No splenomegaly. Adrenals/Urinary Tract: Bilateral adrenal glands appear normal. 3.3 cm left renal sinus cyst is stable and considered benign requiring no independent imaging follow-up. No hydronephrosis. No renal, ureteral or bladder calculi. Urinary bladder is unremarkable for degree of distension Stomach/Bowel: No radiopaque enteric contrast material was administered. Stomach is unremarkable for degree of distension. No pathologic dilation of small or large bowel. No evidence of acute bowel inflammation. Vascular/Lymphatic: Normal caliber abdominal aorta. Smooth IVC contours. No pathologically enlarged abdominal or pelvic lymph nodes. Reproductive: Status post  hysterectomy. No adnexal masses. Other: No significant abdominopelvic free fluid Musculoskeletal: Multilevel degenerative changes spine. No acute osseous abnormality. Very mild degenerative spurring of the bilateral hips. IMPRESSION: No acute abnormality in the abdomen or pelvis. Specifically, no evidence of obstructive uropathy. Electronically Signed   By: Maudry Mayhew M.D.   On: 01/19/2023 18:25       Assessment & Plan:  Screening cholesterol level -     Lipid panel; Future  Anemia, unspecified type Assessment & Plan: Follow cbc.  Orders: -     Hepatic function panel; Future -     Basic metabolic panel; Future  Colon cancer screening -     Ambulatory referral to Gastroenterology  Chronic cystitis Assessment & Plan: Has had recurring bladder issues and recurring UTIs. Saw urology 03/13/23 - started on daily macrobid.  Interested in InterStim.  Test - has helped.  Plans to f/u with urology.    Chronic venous insufficiency Assessment & Plan: Continue compression hose.  Follow up with [facility].    Ductal carcinoma in situ (DCIS) of left breast Assessment & Plan: Left DCIS, grade 1-2. Status post lumpectomy and adjuvant radiation. Not able to tolerate aromatase inhibitor.  Currently off endocrine therapy. Continue surveillance with annual mammogram. Continue annual mammogram screening. (Dr Cathie Hoops - 10/14/22)   Family history of colon cancer Assessment & Plan: Colonoscopy 07/2018 - diverticulum and internal hemorrhoids. Refer back to GI for f/u colonoscopy.  Due screening.    Recurrent UTI Assessment & Plan: Urology - 02/2023 - Suppressive abx - trimethoprim daily (3 months)   Stress Assessment & Plan: Overall appears to be handling things relatively well.  Follow.    Urinary frequency Assessment & Plan: Saw urology 03/13/23 - started on daily macrobid.  Interested in InterStim.  Test - has helped.  Plans to f/u with urology.       Dale [place], MD

## 2023-05-31 ENCOUNTER — Telehealth: Payer: Self-pay

## 2023-05-31 NOTE — Telephone Encounter (Signed)
Patient left message on triage line at 3:38 pm, stating that she had temporary Interstim placed by Dr Sherron Monday last Monday 05/22/23 and noticed today that the phone that was given to her that operates the Interstim reading "error" message. Patient called Interstim company and was advised to call her provider about this. I spoke with [facility] and was told that patient already walked in to the office to ask about this around 4:10 pm. Geraldine Contras advised patient that we are not able to to trouble shoot the temporary device and she would need to follow up with [facility] office.

## 2023-06-02 ENCOUNTER — Encounter: Payer: Self-pay | Admitting: Urology

## 2023-06-02 DIAGNOSIS — N3946 Mixed incontinence: Secondary | ICD-10-CM | POA: Diagnosis not present

## 2023-06-03 ENCOUNTER — Encounter: Payer: Self-pay | Admitting: Internal Medicine

## 2023-06-03 NOTE — Assessment & Plan Note (Signed)
Continue compression hose.  Follow up with [facility].

## 2023-06-03 NOTE — Assessment & Plan Note (Addendum)
Colonoscopy 07/2018 - diverticulum and internal hemorrhoids. Refer back to GI for f/u colonoscopy.  Due screening.

## 2023-06-03 NOTE — Assessment & Plan Note (Signed)
Left DCIS, grade 1-2. Status post lumpectomy and adjuvant radiation. Not able to tolerate aromatase inhibitor.  Currently off endocrine therapy. Continue surveillance with annual mammogram. Continue annual mammogram screening. (Dr Yu - 10/14/22) 

## 2023-06-03 NOTE — Assessment & Plan Note (Signed)
Urology - 02/2023 - Suppressive abx - trimethoprim daily (3 months)

## 2023-06-03 NOTE — Assessment & Plan Note (Signed)
Overall appears to be handling things relatively well.  Follow.   

## 2023-06-03 NOTE — Assessment & Plan Note (Signed)
Follow cbc.  

## 2023-06-03 NOTE — Assessment & Plan Note (Signed)
Has had recurring bladder issues and recurring UTIs. Saw urology 03/13/23 - started on daily macrobid.  Interested in InterStim.  Test - has helped.  Plans to f/u with urology.

## 2023-06-03 NOTE — Assessment & Plan Note (Signed)
Saw urology 03/13/23 - started on daily macrobid.  Interested in InterStim.  Test - has helped.  Plans to f/u with urology.

## 2023-06-06 ENCOUNTER — Telehealth: Payer: Self-pay | Admitting: Urology

## 2023-06-06 NOTE — Telephone Encounter (Signed)
Pam from [facility] returned your call.  She was out of the office.  She said give her a call at [phone] ext [phone]

## 2023-06-07 ENCOUNTER — Other Ambulatory Visit: Payer: Self-pay

## 2023-06-07 ENCOUNTER — Telehealth: Payer: Self-pay

## 2023-06-07 DIAGNOSIS — N3941 Urge incontinence: Secondary | ICD-10-CM

## 2023-06-07 NOTE — Telephone Encounter (Signed)
I spoke with Mrs. Rose Bernard. We have discussed possible surgery dates and Monday July 8th, 2024 was agreed upon by all parties. Patient given information about surgery date, what to expect pre-operatively and post operatively.  We discussed that a Pre-Admission Testing office will be calling to set up the pre-op visit that will take place prior to surgery, and that these appointments are typically done over the phone with a Pre-Admissions RN. Informed patient that our office will communicate any additional care to be provided after surgery. Patients questions or concerns were discussed during our call. Advised to call our office should there be any additional information, questions or concerns that arise. Patient verbalized understanding.

## 2023-06-07 NOTE — Progress Notes (Signed)
   [facility] Surgical Posting Form  Surgery Date: Date: 06/19/2023  Surgeon: Dr. Alfredo Martinez, MD  Inpt ( No  )   Outpt (Yes)   Obs ( No  )   Diagnosis: N39.41 Urge Incontinence  -CPT: 64561, 81191, [phone], [phone]  Surgery: Implantation of Interstim Stage 1 and Stage 2 with Impedence Check  Stop Anticoagulations: No  Cardiac/Medical/Pulmonary Clearance needed: no  *Orders entered into EPIC  Date: 06/07/23   *Case booked in EPIC  Date: 06/07/23  *Notified pt of Surgery: Date: 06/07/23  PRE-OP UA & CX: no  *Placed into Prior Authorization Work Que Date: 06/07/23  Assistant/laser/rep:Yes, [employer] Rep will be present for case.

## 2023-06-12 ENCOUNTER — Encounter
Admission: RE | Admit: 2023-06-12 | Discharge: 2023-06-12 | Disposition: A | Discharge status: Home / Self Care | Payer: BC Managed Care – PPO | Source: Ambulatory Visit | Attending: Urology | Admitting: Urology

## 2023-06-12 ENCOUNTER — Other Ambulatory Visit: Payer: Self-pay

## 2023-06-12 NOTE — Patient Instructions (Addendum)
Your procedure is scheduled on: Monday June 19, 2023. Report to the Registration Desk on the 1st floor of the [facility]. To find out your arrival time, please call [phone] between 1PM - 3PM on: Friday June 16, 2023. If your arrival time is 6:00 am, do not arrive before that time as the [facility] Mall entrance doors do not open until 6:00 am.  REMEMBER: Instructions that are not followed completely may result in serious medical risk, up to and including death; or upon the discretion of your surgeon and anesthesiologist your surgery may need to be rescheduled.  Do not eat food or drink fluids after midnight the night before surgery.  No gum chewing or hard candies.  One week prior to surgery: Stop Anti-inflammatories (NSAIDS) such as Advil, Aleve, Ibuprofen, Motrin, meloxicam, Mobic, Naproxen, Naprosyn and Aspirin based products such as Excedrin, Goody's Powder, BC Powder. Stop ALL OVER THE COUNTER supplements until after surgery. You may however, continue to take Tylenol if needed for pain up until the day of surgery.  Continue taking all prescribed medications with the exception of the following:   TAKE ONLY THESE MEDICATIONS THE MORNING OF SURGERY WITH A SIP OF WATER:  None    No Alcohol for 24 hours before or after surgery.  No Smoking including e-cigarettes for 24 hours before surgery.  No chewable tobacco products for at least 6 hours before surgery.  No nicotine patches on the day of surgery.  Do not use any "recreational" drugs for at least a week (preferably 2 weeks) before your surgery.  Please be advised that the combination of cocaine and anesthesia may have negative outcomes, up to and including death. If you test positive for cocaine, your surgery will be cancelled.  On the morning of surgery brush your teeth with toothpaste and water, you may rinse your mouth with mouthwash if you wish. Do not swallow any toothpaste or mouthwash.  Use CHG Soap or wipes as  directed on instruction sheet.  Do not wear jewelry, make-up, hairpins, clips or nail polish.  Do not wear lotions, powders, or perfumes.   Do not shave body hair from the neck down 48 hours before surgery.  Contact lenses, hearing aids and dentures may not be worn into surgery.  Do not bring valuables to the hospital. [facility] is not responsible for any missing/lost belongings or valuables.   Total Shoulder Arthroplasty:  use Benzoyl Peroxide 5% Gel as directed on instruction sheet.  Bring your C-PAP to the hospital in case you may have to spend the night.   Notify your doctor if there is any change in your medical condition (cold, fever, infection).  Wear comfortable clothing (specific to your surgery type) to the hospital.  After surgery, you can help prevent lung complications by doing breathing exercises.  Take deep breaths and cough every 1-2 hours. Your doctor may order a device called an Incentive Spirometer to help you take deep breaths. When coughing or sneezing, hold a pillow firmly against your incision with both hands. This is called "splinting." Doing this helps protect your incision. It also decreases belly discomfort.  If you are being admitted to the hospital overnight, leave your suitcase in the car. After surgery it may be brought to your room.  In case of increased patient census, it may be necessary for you, the patient, to continue your postoperative care in the Same Day Surgery department.  If you are being discharged the day of surgery, you will not be  allowed to drive home. You will need a responsible individual to drive you home and stay with you for 24 hours after surgery.   If you are taking public transportation, you will need to have a responsible individual with you.  Please call the Pre-admissions Testing Dept. at [phone] if you have any questions about these instructions.  Surgery Visitation Policy:  Patients having surgery or a  procedure may have two visitors.  Children under the age of 37 must have an adult with them who is not the patient.  Inpatient Visitation:    Visiting hours are 7 a.m. to 8 p.m. Up to four visitors are allowed at one time in a patient room. The visitors may rotate out with other people during the day.  One visitor age 42 or older may stay with the patient overnight and must be in the room by 8 p.m.

## 2023-06-14 NOTE — H&P (Signed)
Reviewed the extensive history from December 27, 2021.  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  She has failed Botox.  She has been on Gemtesa and percutaneous tibial nerve stimulation.  Following nurse practitioner in April with worsening symptoms.  Culture was normal on that day   Patient no longer does PTNS or takes [place].  She had pressure in April but a negative culture.  She had little bit of pressure yesterday and took some cranberry.  She has some bedwetting but is not nightly.  She has urge incontinence during the day but does not wear a pad.  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 Hysterectomy and bladder repair with subsequent appendectomy. L Ovary remain in place    APPENDECTOMY        2001 due to scar tissue   BLADDER BOTOX   2014, 2015, 2016   BREAST BIOPSY Bilateral 1991   BREAST BIOPSY Left 12/12/2019    [place] bx, path pending   BREAST SURGERY Bilateral 2002    benign mass removed   COLONOSCOPY   2014   EXCISION OF BREAST BIOPSY Left 01/10/2020    Procedure: EXCISION OF BREAST BIOPSY w/ Needle localization;  Surgeon: Carolan Shiver, MD;  Location: [facility] ORS;  Service: General;  Laterality: Left;   FOOT SURGERY Bilateral     LAPAROSCOPIC HYSTERECTOMY   1995   MASTECTOMY, PARTIAL Left 01/31/2020    Procedure: MASTECTOMY PARTIAL;  Surgeon: Carolan Shiver, MD;  Location: [facility] ORS;  Service: General;  Laterality: Left;   reconstruction of  bladder    1995     2/2 delivery complications with 3rd child       Home Medications:  Allergies as of 06/27/2022         Reactions    Penicillin V Potassium Rash    Did it involve swelling of the face/tongue/throat, SOB, or low BP? No Did it involve sudden or severe rash/hives, skin peeling, or any reaction on the inside of your mouth or nose? No Did you need to seek medical attention at a hospital or doctor's office? No When did it last happen?      25 years ago If all above answers are "NO", may proceed with cephalosporin use.            Medication List           Accurate as of June 27, 2022  8:55 AM. If you have any questions, ask your nurse or doctor.              fluticasone 50 MCG/ACT nasal spray Commonly known as: FLONASE Place 2 sprays into both nostrils daily.    Gemtesa 75 MG Tabs Generic drug: Vibegron Take 75 mg by mouth daily at 6 (six) AM.    PEPCID PO Take by mouth.  vitamin E 1000 UNIT capsule Take 1,000 Units by mouth daily.             Allergies:       Allergies  Allergen Reactions   Penicillin V Potassium Rash      Did it involve swelling of the face/tongue/throat, SOB, or low BP? No Did it involve sudden or severe rash/hives, skin peeling, or any reaction on the inside of your mouth or nose? No Did you need to seek medical attention at a hospital or doctor's office? No When did it last happen?      25 years ago If all above answers are "NO", may proceed with cephalosporin use.        Family History:      Family History  Problem Relation Age of Onset   Colon cancer Father          With ostomy in place    Breast cancer Paternal Aunt 30   Breast cancer Maternal Aunt     Breast cancer Maternal Aunt        Social History:  reports that she has never smoked. She has never used smokeless tobacco. She reports that she does not drink alcohol and does not use drugs.   ROS:                           Physical Exam: LMP  02/07/1994   Constitutional:  Alert and oriented, No acute distress.   Laboratory Data: Recent Labs       Lab Results  Component Value Date    WBC 4.7 10/15/2021    HGB 13.1 10/15/2021    HCT 40.7 10/15/2021    MCV 93.3 10/15/2021    PLT 252 10/15/2021        Recent Labs       Lab Results  Component Value Date    CREATININE 0.72 10/15/2021        Recent Labs  No results found for: "PSA"     Recent Labs  No results found for: "TESTOSTERONE"     Recent Labs       Lab Results  Component Value Date    HGBA1C 5.6 10/01/2015        Urinalysis Labs (Brief)          Component Value Date/Time    COLORURINE YELLOW 02/08/2016 1733    APPEARANCEUR Clear 03/31/2022 1355    LABSPEC 1.020 02/08/2016 1733    PHURINE 6.0 02/08/2016 1733    GLUCOSEU Negative 03/31/2022 1355    GLUCOSEU NEGATIVE 02/08/2016 1733    HGBUR NEGATIVE 02/08/2016 1733    BILIRUBINUR Negative 03/31/2022 1355    KETONESUR NEGATIVE 02/08/2016 1733    PROTEINUR Negative 03/31/2022 1355    UROBILINOGEN 0.2 03/18/2016 1632    UROBILINOGEN 0.2 02/08/2016 1733    NITRITE Negative 03/31/2022 1355    NITRITE NEGATIVE 02/08/2016 1733    LEUKOCYTESUR Negative 03/31/2022 1355        Pertinent Imaging:     Assessment & Plan: Urine sent for culture.  I think we have reached the end of the treatment pathway.  I will see her as needed

## 2023-06-18 MED ORDER — ORAL CARE MOUTH RINSE: 15.0000 mL | Freq: Once | OROMUCOSAL | Status: DC

## 2023-06-18 MED ORDER — LACTATED RINGERS IV SOLN: INTRAVENOUS | Status: DC

## 2023-06-18 MED ORDER — FAMOTIDINE 20 MG PO TABS
20.0000 mg | ORAL_TABLET | Freq: Once | ORAL | Status: AC
  Administered 2023-06-19: 20 mg via ORAL

## 2023-06-18 MED ORDER — VANCOMYCIN HCL IN DEXTROSE 1-5 GM/200ML-% IV SOLN
1000.0000 mg | INTRAVENOUS | Status: AC
  Administered 2023-06-19: 1000 mg via INTRAVENOUS

## 2023-06-18 MED ORDER — CHLORHEXIDINE GLUCONATE 0.12 % MT SOLN: 15.0000 mL | Freq: Once | OROMUCOSAL | Status: DC

## 2023-06-18 MED ORDER — GENTAMICIN SULFATE 40 MG/ML IJ SOLN
5.0000 mg/kg | INTRAVENOUS | Status: AC
  Administered 2023-06-19: 350 mg via INTRAVENOUS
  Filled 2023-06-18: qty 8.75

## 2023-06-19 ENCOUNTER — Other Ambulatory Visit: Payer: Self-pay

## 2023-06-19 ENCOUNTER — Ambulatory Visit: Payer: BC Managed Care – PPO | Admitting: Anesthesiology

## 2023-06-19 ENCOUNTER — Ambulatory Visit: Payer: BC Managed Care – PPO

## 2023-06-19 ENCOUNTER — Encounter
Admission: RE | Disposition: A | Discharge status: Home / Self Care | Payer: Self-pay | Source: Home / Self Care | Attending: Urology

## 2023-06-19 ENCOUNTER — Ambulatory Visit
Admission: RE | Admit: 2023-06-19 | Discharge: 2023-06-19 | Disposition: A | Discharge status: Home / Self Care | Payer: BC Managed Care – PPO | Attending: Urology | Admitting: Urology

## 2023-06-19 ENCOUNTER — Encounter: Payer: Self-pay | Admitting: Urology

## 2023-06-19 DIAGNOSIS — N3941 Urge incontinence: Secondary | ICD-10-CM | POA: Diagnosis not present

## 2023-06-19 HISTORY — PX: INTERSTIM IMPLANT PLACEMENT: SHX5130

## 2023-06-19 SURGERY — INSERTION, SACRAL NERVE STIMULATOR, INTERSTIM, STAGE 1
Anesthesia: General

## 2023-06-19 MED ORDER — FENTANYL CITRATE (PF) 100 MCG/2ML IJ SOLN
INTRAMUSCULAR | Status: AC
  Filled 2023-06-19: qty 2

## 2023-06-19 MED ORDER — PHENYLEPHRINE 80 MCG/ML (10ML) SYRINGE FOR IV PUSH (FOR BLOOD PRESSURE SUPPORT)
PREFILLED_SYRINGE | INTRAVENOUS | Status: AC
  Filled 2023-06-19: qty 10

## 2023-06-19 MED ORDER — KETOROLAC TROMETHAMINE 30 MG/ML IJ SOLN
INTRAMUSCULAR | Status: AC
  Filled 2023-06-19: qty 1

## 2023-06-19 MED ORDER — PROPOFOL 10 MG/ML IV BOLUS
INTRAVENOUS | Status: AC
  Filled 2023-06-19: qty 20

## 2023-06-19 MED ORDER — OXYCODONE HCL 5 MG PO TABS
ORAL_TABLET | ORAL | Status: AC
  Filled 2023-06-19: qty 1

## 2023-06-19 MED ORDER — LIDOCAINE HCL (CARDIAC) PF 100 MG/5ML IV SOSY
PREFILLED_SYRINGE | INTRAVENOUS | Status: DC | PRN
  Administered 2023-06-19: 60 mg via INTRAVENOUS

## 2023-06-19 MED ORDER — EPINEPHRINE PF 1 MG/ML IJ SOLN
INTRAMUSCULAR | Status: AC
  Filled 2023-06-19: qty 1

## 2023-06-19 MED ORDER — MIDAZOLAM HCL 2 MG/2ML IJ SOLN
INTRAMUSCULAR | Status: DC | PRN
  Administered 2023-06-19: 2 mg via INTRAVENOUS

## 2023-06-19 MED ORDER — DEXAMETHASONE SODIUM PHOSPHATE 10 MG/ML IJ SOLN
INTRAMUSCULAR | Status: DC | PRN
  Administered 2023-06-19: 10 mg via INTRAVENOUS

## 2023-06-19 MED ORDER — BUPIVACAINE HCL (PF) 0.5 % IJ SOLN
INTRAMUSCULAR | Status: AC
  Filled 2023-06-19: qty 30

## 2023-06-19 MED ORDER — OXYCODONE HCL 5 MG PO TABS
5.0000 mg | ORAL_TABLET | Freq: Once | ORAL | Status: AC | PRN
  Administered 2023-06-19: 5 mg via ORAL

## 2023-06-19 MED ORDER — HYDROCODONE-ACETAMINOPHEN 5-325 MG PO TABS: 1.0000 | ORAL_TABLET | Freq: Four times a day (QID) | ORAL | 0 refills | Status: DC | PRN

## 2023-06-19 MED ORDER — ONDANSETRON HCL 4 MG/2ML IJ SOLN
INTRAMUSCULAR | Status: DC | PRN
  Administered 2023-06-19: 4 mg via INTRAVENOUS

## 2023-06-19 MED ORDER — OXYCODONE HCL 5 MG/5ML PO SOLN: 5.0000 mg | Freq: Once | ORAL | Status: AC | PRN

## 2023-06-19 MED ORDER — 0.9 % SODIUM CHLORIDE (POUR BTL) OPTIME
TOPICAL | Status: DC | PRN
  Administered 2023-06-19: 500 mL

## 2023-06-19 MED ORDER — BUPIVACAINE-EPINEPHRINE 0.5% -1:200000 IJ SOLN
INTRAMUSCULAR | Status: DC | PRN
  Administered 2023-06-19: 30 mL

## 2023-06-19 MED ORDER — FENTANYL CITRATE (PF) 100 MCG/2ML IJ SOLN: 25.0000 ug | INTRAMUSCULAR | Status: DC | PRN

## 2023-06-19 MED ORDER — MIDAZOLAM HCL 2 MG/2ML IJ SOLN
INTRAMUSCULAR | Status: AC
  Filled 2023-06-19: qty 2

## 2023-06-19 MED ORDER — FAMOTIDINE 20 MG PO TABS
ORAL_TABLET | ORAL | Status: AC
  Filled 2023-06-19: qty 1

## 2023-06-19 MED ORDER — KETOROLAC TROMETHAMINE 30 MG/ML IJ SOLN
INTRAMUSCULAR | Status: DC | PRN
  Administered 2023-06-19: 30 mg via INTRAVENOUS

## 2023-06-19 MED ORDER — EPHEDRINE SULFATE-NACL 50-0.9 MG/10ML-% IV SOSY
PREFILLED_SYRINGE | INTRAVENOUS | Status: DC | PRN
  Administered 2023-06-19: 5 mg via INTRAVENOUS

## 2023-06-19 MED ORDER — SUCCINYLCHOLINE CHLORIDE 200 MG/10ML IV SOSY
PREFILLED_SYRINGE | INTRAVENOUS | Status: DC | PRN
  Administered 2023-06-19: 120 mg via INTRAVENOUS

## 2023-06-19 MED ORDER — ACETAMINOPHEN 10 MG/ML IV SOLN
INTRAVENOUS | Status: AC
  Filled 2023-06-19: qty 100

## 2023-06-19 MED ORDER — PROPOFOL 10 MG/ML IV BOLUS
INTRAVENOUS | Status: DC | PRN
  Administered 2023-06-19: 120 mg via INTRAVENOUS

## 2023-06-19 MED ORDER — PHENYLEPHRINE HCL (PRESSORS) 10 MG/ML IV SOLN
INTRAVENOUS | Status: DC | PRN
  Administered 2023-06-19 (×4): 160 ug via INTRAVENOUS

## 2023-06-19 MED ORDER — CEPHALEXIN 500 MG PO CAPS: 500.0000 mg | ORAL_CAPSULE | Freq: Three times a day (TID) | ORAL | 0 refills | Status: AC

## 2023-06-19 MED ORDER — SUCCINYLCHOLINE CHLORIDE 200 MG/10ML IV SOSY
PREFILLED_SYRINGE | INTRAVENOUS | Status: AC
  Filled 2023-06-19: qty 10

## 2023-06-19 MED ORDER — ACETAMINOPHEN 10 MG/ML IV SOLN
INTRAVENOUS | Status: DC | PRN
  Administered 2023-06-19: 1000 mg via INTRAVENOUS

## 2023-06-19 MED ORDER — VANCOMYCIN HCL IN DEXTROSE 1-5 GM/200ML-% IV SOLN
INTRAVENOUS | Status: AC
  Filled 2023-06-19: qty 200

## 2023-06-19 MED ORDER — FENTANYL CITRATE (PF) 100 MCG/2ML IJ SOLN
INTRAMUSCULAR | Status: DC | PRN
  Administered 2023-06-19 (×2): 50 ug via INTRAVENOUS

## 2023-06-19 SURGICAL SUPPLY — 58 items
ADH SKN CLS APL DERMABOND .7 (GAUZE/BANDAGES/DRESSINGS) ×1
APL PRP STRL LF DISP 70% ISPRP (MISCELLANEOUS) ×1
APL SKNCLS STERI-STRIP NONHPOA (GAUZE/BANDAGES/DRESSINGS)
BENZOIN TINCTURE PRP APPL 2/3 (GAUZE/BANDAGES/DRESSINGS) IMPLANT
BLADE SURG 15 STRL LF DISP TIS (BLADE) ×1 IMPLANT
BLADE SURG 15 STRL SS (BLADE) ×1
CHLORAPREP W/TINT 26 (MISCELLANEOUS) ×1 IMPLANT
COVER BACK TABLE REUSABLE LG (DRAPES) ×1 IMPLANT
COVER LIGHT HANDLE STERIS (MISCELLANEOUS) IMPLANT
COVER MAYO STAND REUSABLE (DRAPES) ×1 IMPLANT
COVER PROBE FLX POLY STRL (MISCELLANEOUS) ×1 IMPLANT
DERMABOND ADVANCED .7 DNX12 (GAUZE/BANDAGES/DRESSINGS) ×1 IMPLANT
DRAPE 3/4 80X56 (DRAPES) ×1 IMPLANT
DRAPE C-ARM 42X72 X-RAY (DRAPES) ×1 IMPLANT
DRAPE C-ARM XRAY 36X54 (DRAPES) ×1 IMPLANT
DRAPE C-ARMOR (DRAPES) ×1 IMPLANT
DRAPE INCISE 23X17 STRL (DRAPES) ×1 IMPLANT
DRAPE INCISE IOBAN 23X17 STRL (DRAPES) ×1 IMPLANT
DRAPE INCISE IOBAN 66X45 STRL (DRAPES) ×1 IMPLANT
DRAPE LAPAROSCOPIC ABDOMINAL (DRAPES) ×1 IMPLANT
DRAPE LAPAROTOMY 100X77 ABD (DRAPES) ×1 IMPLANT
DRSG TEGADERM 2-3/8X2-3/4 SM (GAUZE/BANDAGES/DRESSINGS) ×1 IMPLANT
DRSG TEGADERM 4X4.75 (GAUZE/BANDAGES/DRESSINGS) ×1 IMPLANT
DRSG TELFA 3X8 NADH STRL (GAUZE/BANDAGES/DRESSINGS) ×1 IMPLANT
ELECT REM PT RETURN 9FT ADLT (ELECTROSURGICAL) ×1
ELECTRODE REM PT RTRN 9FT ADLT (ELECTROSURGICAL) ×1 IMPLANT
External Neurostimulator IMPLANT
GAUZE 4X4 16PLY ~~LOC~~+RFID DBL (SPONGE) ×1 IMPLANT
GAUZE SPONGE 4X4 12PLY STRL (GAUZE/BANDAGES/DRESSINGS) IMPLANT
GLOVE BIO SURGEON STRL SZ7.5 (GLOVE) ×1 IMPLANT
GOWN STRL REUS W/ TWL XL LVL3 (GOWN DISPOSABLE) ×1 IMPLANT
GOWN STRL REUS W/TWL XL LVL3 (GOWN DISPOSABLE) ×1
KIT HANDSET INTERSTIM COMM (NEUROSURGERY SUPPLIES) IMPLANT
KIT TURNOVER CYSTO (KITS) ×1 IMPLANT
LEAD INTERSTIM 4.32 28 L (Lead) IMPLANT
MANIFOLD NEPTUNE II (INSTRUMENTS) ×1 IMPLANT
NDL HYPO 22X1.5 SAFETY MO (MISCELLANEOUS) ×1 IMPLANT
NEEDLE HYPO 22X1.5 SAFETY MO (MISCELLANEOUS) ×1 IMPLANT
NEUROSTIMULATOR 1.7X2X.06 (UROLOGICAL SUPPLIES) IMPLANT
PACK BASIN MINOR ARMC (MISCELLANEOUS) ×1 IMPLANT
STIMULATOR INTERSTIM 2X1.7X.3 (Miscellaneous) ×1 IMPLANT
STRIP CLOSURE SKIN 1/2X4 (GAUZE/BANDAGES/DRESSINGS) ×1 IMPLANT
SUCTION TUBE FRAZIER 10FR DISP (SUCTIONS) ×1 IMPLANT
SUT SILK 2 0 (SUTURE)
SUT SILK 2-0 18XBRD TIE 12 (SUTURE) IMPLANT
SUT VIC AB 3-0 SH 27 (SUTURE) ×2
SUT VIC AB 3-0 SH 27X BRD (SUTURE) ×2 IMPLANT
SUT VIC AB 4-0 PS2 18 (SUTURE) ×1 IMPLANT
SUT VIC AB 4-0 RB1 27 (SUTURE) ×3
SUT VIC AB 4-0 RB1 27X BRD (SUTURE) ×3 IMPLANT
SYR 10ML LL (SYRINGE) ×2 IMPLANT
SYR BULB IRRIG 60ML STRL (SYRINGE) ×1 IMPLANT
SYR CONTROL 10ML LL (SYRINGE) IMPLANT
TOWEL OR 17X26 4PK STRL BLUE (TOWEL DISPOSABLE) ×2 IMPLANT
TRAP FLUID SMOKE EVACUATOR (MISCELLANEOUS) ×1 IMPLANT
TUBING CONNECTING 10 (TUBING) IMPLANT
WATER STERILE IRR 1000ML POUR (IV SOLUTION) ×1 IMPLANT
WATER STERILE IRR 500ML POUR (IV SOLUTION) ×1 IMPLANT

## 2023-06-19 NOTE — Anesthesia Postprocedure Evaluation (Signed)
Anesthesia Post Note  Patient: BECKA HIPKE  Procedure(s) Performed: Leane Platt IMPLANT FIRST STAGE WITH IMPEDENCE CHECK INTERSTIM IMPLANT SECOND STAGE  Patient location during evaluation: PACU Anesthesia Type: General Level of consciousness: awake and alert Pain management: pain level controlled Vital Signs Assessment: post-procedure vital signs reviewed and stable Respiratory status: spontaneous breathing, nonlabored ventilation, respiratory function stable and patient connected to nasal cannula oxygen Cardiovascular status: blood pressure returned to baseline and stable Postop Assessment: no apparent nausea or vomiting Anesthetic complications: no  No notable events documented.   Last Vitals:  Vitals:   06/19/23 1625 06/19/23 1635  BP:  122/61  Pulse: 84 82  Resp: 20 15  Temp: (!) 36.4 C 37.2 C  SpO2: 100% 100%    Last Pain:  Vitals:   06/19/23 1635  TempSrc: Temporal  PainSc: 4                  Stephanie Coup

## 2023-06-19 NOTE — Anesthesia Preprocedure Evaluation (Signed)
Anesthesia Evaluation  Patient identified by MRN, date of birth, ID band Patient awake    Reviewed: Allergy & Precautions, NPO status , Patient's Chart, lab work & pertinent test results  History of Anesthesia Complications Negative for: history of anesthetic complications  Airway Mallampati: III  TM Distance: <3 FB Neck ROM: full    Dental  (+) Chipped   Pulmonary neg pulmonary ROS, neg shortness of breath   Pulmonary exam normal        Cardiovascular Exercise Tolerance: Good (-) angina (-) Past MI negative cardio ROS Normal cardiovascular exam     Neuro/Psych  Headaches  negative psych ROS   GI/Hepatic Neg liver ROS,GERD  ,,  Endo/Other  negative endocrine ROS    Renal/GU Renal disease     Musculoskeletal   Abdominal   Peds  Hematology negative hematology ROS (+)   Anesthesia Other Findings Past Medical History: 2021: Cancer (HCC)     Comment:  Breast cancer No date: Chronic cystitis No date: Frequent UTI No date: GERD (gastroesophageal reflux disease) No date: History of kidney stones No date: Incomplete bladder emptying No date: Personal history of radiation therapy 11/11/2020: Status post breast lumpectomy No date: 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 Hysterectomy               and bladder repair with subsequent appendectomy. L Ovary               remain in place  No date: APPENDECTOMY     Comment:  2001 due to scar tissue 2014, 2015, 2016: BLADDER BOTOX 1991: BREAST BIOPSY; Bilateral 12/12/2019: BREAST BIOPSY; Left     Comment:  [place] bx, path pending No date: BREAST LUMPECTOMY 2002: BREAST SURGERY; Bilateral     Comment:  benign mass removed 2014: COLONOSCOPY 01/10/2020: EXCISION OF BREAST BIOPSY; Left     Comment:  Procedure: EXCISION OF BREAST BIOPSY w/ Needle                localization;  Surgeon: Carolan Shiver, MD;                Location: [facility] ORS;  Service: General;  Laterality: Left; No date: FOOT SURGERY; Bilateral 1995: LAPAROSCOPIC HYSTERECTOMY 01/31/2020: MASTECTOMY, PARTIAL; Left     Comment:  Procedure: MASTECTOMY PARTIAL;  Surgeon: Carolan Shiver, MD;  Location: [facility] ORS;  Service: General;                Laterality: Left; 1995: reconstruction of bladder      Comment:  2/2 delivery complications with 3rd child      Reproductive/Obstetrics negative OB ROS                             Anesthesia Physical Anesthesia Plan  ASA: 2  Anesthesia Plan: General ETT   Post-op Pain Management:    Induction: Intravenous  PONV Risk Score and Plan: Ondansetron, Dexamethasone, Midazolam and Treatment may vary due to age or medical condition  Airway Management Planned: Oral ETT  Additional Equipment:   Intra-op Plan:   Post-operative Plan: Extubation in OR  Informed Consent: I have reviewed the patients History and Physical, chart, labs and discussed the procedure including the risks, benefits and alternatives for the proposed anesthesia with the patient or  authorized representative who has indicated his/her understanding and acceptance.     Dental Advisory Given  Plan Discussed with: Anesthesiologist, CRNA and Surgeon  Anesthesia Plan Comments: (Patient consented for risks of anesthesia including but not limited to:  - adverse reactions to medications - damage to eyes, teeth, lips or other oral mucosa - nerve damage due to positioning  - sore throat or hoarseness - Damage to heart, brain, nerves, lungs, other parts of body or loss of life  Patient voiced understanding.)       Anesthesia Quick Evaluation

## 2023-06-19 NOTE — Transfer of Care (Signed)
Immediate Anesthesia Transfer of Care Note  Patient: Rose Bernard  Procedure(s) Performed: Leane Platt IMPLANT FIRST STAGE WITH IMPEDENCE CHECK INTERSTIM IMPLANT SECOND STAGE  Patient Location: PACU  Anesthesia Type:General  Level of Consciousness: awake and drowsy  Airway & Oxygen Therapy: Patient Spontanous Breathing and Patient connected to face mask oxygen  Post-op Assessment: Report given to RN and Post -op Vital signs reviewed and stable  Post vital signs: Reviewed and stable  Last Vitals:  Vitals Value Taken Time  BP 136/67 06/19/23 1547  Temp    Pulse 84 06/19/23 1549  Resp 16 06/19/23 1549  SpO2 100 % 06/19/23 1549  Vitals shown include unvalidated device data.  Last Pain:  Vitals:   06/19/23 1320  TempSrc: Temporal  PainSc: 0-No pain         Complications: No notable events documented.

## 2023-06-19 NOTE — Interval H&P Note (Signed)
History and Physical Interval Note:  06/19/2023 2:02 PM  Rose Bernard  has presented today for surgery, with the diagnosis of Urge Incontinence.  The various methods of treatment have been discussed with the patient and family. After consideration of risks, benefits and other options for treatment, the patient has consented to  Procedure(s): INTERSTIM IMPLANT FIRST STAGE WITH IMPEDENCE CHECK (N/A) INTERSTIM IMPLANT SECOND STAGE (N/A) as a surgical intervention.  The patient's history has been reviewed, patient examined, no change in status, stable for surgery.  I have reviewed the patient's chart and labs.  Questions were answered to the patient's satisfaction.     Rose Bernard

## 2023-06-19 NOTE — Op Note (Signed)
Preoperative diagnosis: Refractory urgency incontinence Postoperative diagnosis: Refractory urgency incontinence Surgery: Stage II and stage I InterStim implantation plus impedance check Surgeon: Dr. Lorin Picket Marleny Faller  The patient was prepped and draped in the prone position using sterile technique.  Preoperative antibiotics were given.  I marked the S3 foramina bilaterally with 3.5 inch frame needles.  I used 10 cc of lidocaine epinephrine mixture on the right side.  It was easy to find the S3 foramina on the right.  She had a moderate bowel response and she may have had a little bit of toe response but it may have been present.  Radiographically it looks like S3 but I want to change the angle to come in more at 90 degrees and I want to test the left side.  She had good bellows but little to no toe responses with the peripheral nerve evaluation reviewed prior to surgery.  She had a long coccyx and I did not think I should go higher as it would be S2 as it was during the peripheral nerve evaluation the patient definitely was getting toe response  10 cc of lidocaine epinephrine mixture was used on the left.  It was relatively easy to find the S3 foramina on the left and she had a very good bellows response and a reasonable toe response at low amplitude.  It was very reproducible.  The response was stronger on the left than the right.  I removed the right foramen needle.  I passed the guide to the appropriate depth.  1 cc of lidocaine epinephrine was used at the skin level.  I made a 1 cm scalpel incision appropriate depth.  White trocar was passed to the appropriate depth.  Well-prepared lead was placed to the appropriate depth.  She had very good bellows and toe responses at 1.5 amplitude or less.  This was reproducible on all 4 positions.  Under fluoroscopic guidance I remove the white trocar disengaging the inner wire as well.  I took a hardcopy x-ray anterior and lateral.  The lead tract very lateral as  noted on x-ray and I think a little bit is an artifact based upon the angle of the fluoroscopy.  Having said that there was no stacking and she had excellent responses.  I did not want to change the position of the lead based upon responses.  I marked a 4 and half centimeter right buttock incision and used 20 cc of lidocaine epinephrine mixture.  I dissected down through skin and subcutaneous tissue to the appropriate depth.  She does not have a lot of subcutaneous tissue.  At the appropriate depth and made a nice pocket using finger dissection and cautery.  I liberated the lead from medial to lateral from the patient's left side the right side.  Lead was attached to the IPG with screwdriver.  The lead and nicely into the pocket tension free.  Impedance check in all 4 positions was normal  I closed the right buttock incision with running 3-0 Vicryl subcutaneous tissue followed by 4-0 Monocryl subcuticular.  I closed the midline incision with interrupted 4-0 Vicryl.  Sterile dressing was applied.  I was very pleased with the surgery and hopefully region of the patient's treatment goal.

## 2023-06-19 NOTE — Progress Notes (Signed)
Preoperative physical examination normal.  Normal respiratory and cardiac exam Questions answered and postoperative course discussed

## 2023-06-19 NOTE — Anesthesia Procedure Notes (Signed)
Procedure Name: Intubation Date/Time: 06/19/2023 2:26 PM  Performed by: Omer Jack, CRNAPre-anesthesia Checklist: Patient identified, Patient being monitored, Timeout performed, Emergency Drugs available and Suction available Patient Re-evaluated:Patient Re-evaluated prior to induction Oxygen Delivery Method: Circle system utilized Preoxygenation: Pre-oxygenation with 100% oxygen Induction Type: IV induction Ventilation: Mask ventilation without difficulty Laryngoscope Size: Mac, 3 and McGraph Grade View: Grade I Tube type: Oral Tube size: 7.0 mm Number of attempts: 1 Airway Equipment and Method: Stylet Placement Confirmation: ETT inserted through vocal cords under direct vision, positive ETCO2 and breath sounds checked- equal and bilateral Secured at: 21 cm Tube secured with: Tape Dental Injury: Teeth and Oropharynx as per pre-operative assessment

## 2023-06-19 NOTE — Discharge Instructions (Addendum)
I have reviewed discharge instructions in detail with the patient. They will follow-up with me or their physician as scheduled. My nurse will also be calling the patients as per protocol.   Remove dressing Saturday  Stop macrodantin while taking keflex  No straining two weeksAMBULATORY SURGERY  DISCHARGE INSTRUCTIONS   The drugs that you were given will stay in your system until tomorrow so for the next 24 hours you should not:  Drive an automobile Make any legal decisions Drink any alcoholic beverage   You may resume regular meals tomorrow.  Today it is better to start with liquids and gradually work up to solid foods.  You may eat anything you prefer, but it is better to start with liquids, then soup and crackers, and gradually work up to solid foods.   Please notify your doctor immediately if you have any unusual bleeding, trouble breathing, redness and pain at the surgery site, drainage, fever, or pain not relieved by medication.    Additional Instructions:        Please contact your physician with any problems or Same Day Surgery at [phone], Monday through Friday 6 am to 4 pm, or [place] at [facility] number at [phone].

## 2023-06-20 ENCOUNTER — Telehealth: Payer: Self-pay

## 2023-06-20 ENCOUNTER — Encounter: Payer: Self-pay | Admitting: Urology

## 2023-06-20 NOTE — Telephone Encounter (Signed)
Called patient to follow up on how she was feeling post surgery on 06/19/23 and patient states she is doing ok and no complications noted at this time.  Patient scheduled to follow up with Sam on 07/05/23 and advised to bring all of the equipment/device for the interstim and make sure the phone device is charged.

## 2023-07-05 ENCOUNTER — Ambulatory Visit (INDEPENDENT_AMBULATORY_CARE_PROVIDER_SITE_OTHER): Payer: BC Managed Care – PPO | Admitting: Physician Assistant

## 2023-07-05 ENCOUNTER — Encounter: Payer: Self-pay | Admitting: Physician Assistant

## 2023-07-05 VITALS — HR 70 | Temp 97.7°F | Ht 67.0 in | Wt 157.2 lb

## 2023-07-05 DIAGNOSIS — N3281 Overactive bladder: Secondary | ICD-10-CM | POA: Diagnosis not present

## 2023-07-05 NOTE — Progress Notes (Signed)
07/05/2023 1:15 PM   Rose Bernard [birth year] [id number]  CC: Chief Complaint  Patient presents with   Post-op Follow-up    Interstim    HPI: Rose Bernard is a 57 y.o. female with PMH ureteral reimplant in the 1990s and severe refractory OAB who underwent InterStim placement with Dr. Sherron Monday 16 days ago who presents today for postop follow-up.   Today she reports significant symptomatic improvement with InterStim in place.  She has occasional small-volume leaks and wears pads for security, but admits that sometimes she does not need these.  She feels she has had greater than 50% improvement with the device and overall is very pleased.  Device is set to P3, 1.5 on arrival today.  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 Hysterectomy and bladder repair with subsequent appendectomy. L Ovary remain in place    APPENDECTOMY     2001 due to scar tissue   BLADDER BOTOX  2014, 2015, 2016   BREAST BIOPSY Bilateral 1991   BREAST BIOPSY Left 12/12/2019   [place] bx, path pending   BREAST LUMPECTOMY     BREAST SURGERY Bilateral 2002   benign mass removed   COLONOSCOPY  2014   EXCISION OF BREAST BIOPSY Left 01/10/2020   Procedure: EXCISION OF BREAST BIOPSY w/ Needle localization;  Surgeon: Carolan Shiver, MD;  Location: [facility] OR[facility];  Service: General;  Laterality: Left;   FOOT SURGERY Bilateral    INTERSTIM IMPLANT PLACEMENT N/A 06/19/2023   Procedure: Leane Platt IMPLANT FIRST STAGE WITH IMPEDENCE CHECK;  Surgeon:  Alfredo Martinez, MD;  Location: [facility] OR[facility];  Service: Urology;  Laterality: N/A;   INTERSTIM IMPLANT PLACEMENT N/A 06/19/2023   Procedure: Leane Platt IMPLANT SECOND STAGE;  Surgeon: Alfredo Martinez, MD;  Location: [facility] ORS;  Service: Urology;  Laterality: N/A;   LAPAROSCOPIC HYSTERECTOMY  1995   MASTECTOMY, PARTIAL Left 01/31/2020   Procedure: MASTECTOMY PARTIAL;  Surgeon: Carolan Shiver, MD;  Location: [facility] ORS;  Service: General;  Laterality: Left;   reconstruction of bladder   1995   2/2 delivery complications with 3rd child     Home Medications:  Allergies as of 07/05/2023       Reactions   Trimpex [trimethoprim] Nausea Only   Penicillin V Potassium Rash   Did it involve swelling of the face/tongue/throat, SOB, or low BP? No Did it involve sudden or severe rash/hives, skin peeling, or any reaction on the inside of your mouth or nose? No Did you need to seek medical attention at a hospital or doctor's office? No When did it last happen?      25 years ago If all above answers are "NO", may proceed with cephalosporin use.        Medication List        Accurate as of July 05, 2023  1:15 PM. If you have any questions, ask your nurse or doctor.          Azelastine HCl 137 MCG/SPRAY Soln Place 1 spray into  both nostrils 2 (two) times daily.   estradiol 0.1 MG/GM vaginal cream Commonly known as: ESTRACE Apply one pea-sized amount around the opening of the urethra daily for 2 weeks, then 3 times weekly moving forward.   fluticasone 50 MCG/ACT nasal spray Commonly known as: FLONASE Place 2 sprays into both nostrils daily.   HYDROcodone-acetaminophen 5-325 MG tablet Commonly known as: Norco Take 1-2 tablets by mouth every 6 (six) hours as needed for moderate pain.   nitrofurantoin (macrocrystal-monohydrate) 100 MG capsule Commonly known as: MACROBID Take 1 capsule (100 mg total) by mouth daily.   nitrofurantoin (macrocrystal-monohydrate) 100 MG capsule Commonly  known as: Macrobid Take 1 capsule (100 mg total) by mouth daily.   vitamin E 1000 UNIT capsule Take 1,000 Units by mouth daily.        Allergies:  Allergies  Allergen Reactions   Trimpex [Trimethoprim] Nausea Only   Penicillin V Potassium Rash    Did it involve swelling of the face/tongue/throat, SOB, or low BP? No Did it involve sudden or severe rash/hives, skin peeling, or any reaction on the inside of your mouth or nose? No Did you need to seek medical attention at a hospital or doctor's office? No When did it last happen?      25 years ago If all above answers are "NO", may proceed with cephalosporin use.     Family History: Family History  Problem Relation Age of Onset   Colon cancer Father        With ostomy in place    Breast cancer Paternal Aunt 53   Breast cancer Maternal Aunt    Breast cancer Maternal Aunt     Social History:   reports that she has never smoked. She has never been exposed to tobacco smoke. She has never used smokeless tobacco. She reports that she does not drink alcohol and does not use drugs.  Physical Exam: Pulse 70   Temp 97.7 F (36.5 C)   Ht 5\' 7"  (1.702 m)   Wt 157 lb 3.2 oz (71.3 kg)   LMP 02/07/1994   BMI 24.62 kg/m   Constitutional:  Alert and oriented, no acute distress, nontoxic appearing HEENT: [place], AT Cardiovascular: No clubbing, cyanosis, or edema Respiratory: Normal respiratory effort, no increased work of breathing GU: Surgical incisions are well-healed.  No surrounding erythema.  There is a small patch of residual surgical adhesive over her left sacral wound. Skin: No rashes, bruises or suspicious lesions Neurologic: Grossly intact, no focal deficits, moving all 4 extremities Psychiatric: Normal mood and affect  Assessment & Plan:   1. OAB (overactive bladder) Surgical incisions are well-healed and she has had excellent symptomatic improvement with the InterStim in place.  We discussed how to adjust her settings as  needed and I demonstrated this on her controller.  All questions answered.  She knows to follow-up with me as needed.  Return in about 6 months (around 01/05/2024) for Postop f/u with Dr. Sherron Monday.  Carman Ching, PA-C  [facility] [place] [address] [place], [place] [address] [phone]

## 2023-07-05 NOTE — Patient Instructions (Signed)
Your Interstim Components  Your device has three components: The implant, which you can feel in your buttock The communicator, which is the white and blue flat credit card-sized device you place over your implant when you want to adjust its settings The remote, which is the [facility] cell phone device where you adjust your settings  Think of your implant as a television, the remote as your remote control where you change the settings and turn it on and off, and the communicator as the receiver the TV needs to get instructions from the remote control. Once you turn the TV on, it's going to stay on until you turn it off. If the batteries in the remote or the communicator die, the TV will keep running. That means that your device will keep working if either of the two other components are powered off, run out of battery, or are not physically with you. In fact, you only need the communicator and remote when you are adjusting your settings (or coming to clinic to get help)--otherwise you can leave them at home!  How to Adjust Your Interstim Settings  Today we set your device to Program 3, Intensity 1.5.  Your device has 7 different programs, and you may need to try a few of them to achieve maximum results. Once you find a program that is working well for you, you may stay on it.  It takes time after changing the program to see results. Every time you change the program, you should plan to stay on that program for 2 full weeks before moving on to the next one. After the first week, if your symptoms are no better, then you may increase the intensity--but keep the program the same. If after 2 weeks on the same program you still notice no difference, then you may switch to a different program and repeat the process.

## 2023-07-11 DIAGNOSIS — Z86018 Personal history of other benign neoplasm: Secondary | ICD-10-CM | POA: Diagnosis not present

## 2023-07-11 DIAGNOSIS — D225 Melanocytic nevi of trunk: Secondary | ICD-10-CM | POA: Diagnosis not present

## 2023-07-11 DIAGNOSIS — L578 Other skin changes due to chronic exposure to nonionizing radiation: Secondary | ICD-10-CM | POA: Diagnosis not present

## 2023-07-11 DIAGNOSIS — D485 Neoplasm of uncertain behavior of skin: Secondary | ICD-10-CM | POA: Diagnosis not present

## 2023-07-11 DIAGNOSIS — Z872 Personal history of diseases of the skin and subcutaneous tissue: Secondary | ICD-10-CM | POA: Diagnosis not present

## 2023-07-13 ENCOUNTER — Telehealth: Payer: Self-pay | Admitting: Internal Medicine

## 2023-07-13 NOTE — Telephone Encounter (Signed)
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 She needs a health form completed by her provider and a TB injection. Patient wanted to know if Dr Carron Curie will complete for?

## 2023-07-17 ENCOUNTER — Ambulatory Visit (INDEPENDENT_AMBULATORY_CARE_PROVIDER_SITE_OTHER): Payer: BC Managed Care – PPO

## 2023-07-17 DIAGNOSIS — Z111 Encounter for screening for respiratory tuberculosis: Secondary | ICD-10-CM

## 2023-07-17 NOTE — Progress Notes (Signed)
PPD Placement note Rose Bernard, 57 y.o. female is here today for placement of PPD test Reason for PPD test: Employment Pt taken PPD test before: no Verified in allergy area and with patient that they are not allergic to the products PPD is made of (Phenol or Tween). Yes Is patient taking any oral or IV steroid medication now or have they taken it in the last month? no Has the patient ever received the BCG vaccine?: no Has the patient been in recent contact with anyone known or suspected of having active TB disease?: no      Date of exposure (if applicable): N/A      Name of person they were exposed to (if applicable): N/A Patient's Country of origin?: [place] O: Alert and oriented in NAD. P:  PPD placed on 07/17/2023.  Patient advised to return for reading within 48-72 hours.

## 2023-07-17 NOTE — Telephone Encounter (Signed)
LMTCB. Need to know when form is due. If she could drop off form for me to review then determine if visit is necessary that would be great. If this is an issue, let me know.

## 2023-07-17 NOTE — Telephone Encounter (Signed)
PPD test scheduled. Will bring form for Dr Lorin Picket to review. If visit is needed I will schedule

## 2023-07-17 NOTE — Telephone Encounter (Signed)
Pt called Rose Berkshire LPN call. Transferred.

## 2023-07-19 ENCOUNTER — Ambulatory Visit: Payer: BC Managed Care – PPO

## 2023-07-19 NOTE — Progress Notes (Signed)
PPD Reading Note  PPD read and results entered in EpicCare.  Result: 0 mm induration.  Interpretation: negative  Allergic reaction: no

## 2023-07-19 NOTE — Progress Notes (Signed)
Reviewed.    Dr  

## 2023-08-29 ENCOUNTER — Telehealth (INDEPENDENT_AMBULATORY_CARE_PROVIDER_SITE_OTHER): Payer: Self-pay

## 2023-08-29 NOTE — Telephone Encounter (Signed)
Pt called stating she was waiting on someone to call since last October to schedule a laser procedure.  I let her know that I would look into it and give her a call back.  Tried to call pt back to let her  know in April she had talked with Kenney Houseman and was going to get [place] her new insurance info and we have not received anything yet.  LVM for pt to call office back with ins info so we can start the PA process for her Laser procedure.

## 2023-08-30 ENCOUNTER — Other Ambulatory Visit: Payer: Self-pay | Admitting: Internal Medicine

## 2023-08-30 DIAGNOSIS — Z1231 Encounter for screening mammogram for malignant neoplasm of breast: Secondary | ICD-10-CM

## 2023-09-05 ENCOUNTER — Other Ambulatory Visit (INDEPENDENT_AMBULATORY_CARE_PROVIDER_SITE_OTHER): Payer: Self-pay

## 2023-09-05 NOTE — Telephone Encounter (Signed)
Spoke with pt and scheduled appts for her laser

## 2023-09-05 NOTE — Telephone Encounter (Signed)
Maille called stated she returning Sherry's call. She has tried to other times and has not received a call back. Please call patient to go over any questions she may have have.

## 2023-09-07 MED ORDER — ALPRAZOLAM 0.5 MG PO TABS: ORAL_TABLET | ORAL | 0 refills | Status: DC

## 2023-09-25 ENCOUNTER — Other Ambulatory Visit: Payer: BC Managed Care – PPO

## 2023-09-28 ENCOUNTER — Ambulatory Visit: Payer: BC Managed Care – PPO | Admitting: Internal Medicine

## 2023-10-06 ENCOUNTER — Other Ambulatory Visit (INDEPENDENT_AMBULATORY_CARE_PROVIDER_SITE_OTHER): Payer: BC Managed Care – PPO

## 2023-10-06 DIAGNOSIS — D649 Anemia, unspecified: Secondary | ICD-10-CM

## 2023-10-06 DIAGNOSIS — Z13220 Encounter for screening for lipoid disorders: Secondary | ICD-10-CM | POA: Diagnosis not present

## 2023-10-06 LAB — BASIC METABOLIC PANEL
BUN: 16 mg/dL (ref 6–23)
CO2: 30 meq/L (ref 19–32)
Calcium: 9 mg/dL (ref 8.4–10.5)
Chloride: 103 meq/L (ref 96–112)
Creatinine, Ser: 0.66 mg/dL (ref 0.40–1.20)
GFR: 97.17 mL/min (ref >60.00)
Glucose, Bld: 82 mg/dL (ref 70–99)
Potassium: 4.1 meq/L (ref 3.5–5.1)
Sodium: 140 meq/L (ref 135–145)

## 2023-10-06 LAB — LIPID PANEL
Cholesterol: 239 mg/dL — ABNORMAL HIGH (ref 0–200)
HDL: 68.8 mg/dL (ref >39.00)
LDL Cholesterol: 148 mg/dL — ABNORMAL HIGH (ref 0–99)
NonHDL: 169.74
Total CHOL/HDL Ratio: 3
Triglycerides: 111 mg/dL (ref 0.0–149.0)
VLDL: 22.2 mg/dL (ref 0.0–40.0)

## 2023-10-06 LAB — HEPATIC FUNCTION PANEL
ALT: 24 U/L (ref 0–35)
AST: 17 U/L (ref 0–37)
Albumin: 4.2 g/dL (ref 3.5–5.2)
Alkaline Phosphatase: 101 U/L (ref 39–117)
Bilirubin, Direct: 0.1 mg/dL (ref 0.0–0.3)
Total Bilirubin: 0.5 mg/dL (ref 0.2–1.2)
Total Protein: 7 g/dL (ref 6.0–8.3)

## 2023-10-09 ENCOUNTER — Ambulatory Visit (INDEPENDENT_AMBULATORY_CARE_PROVIDER_SITE_OTHER): Payer: BC Managed Care – PPO | Admitting: Internal Medicine

## 2023-10-09 VITALS — BP 122/70 | HR 64 | Temp 98.2°F | Resp 16 | Ht 67.0 in | Wt 162.0 lb

## 2023-10-09 DIAGNOSIS — Z800 Family history of malignant neoplasm of digestive organs: Secondary | ICD-10-CM | POA: Diagnosis not present

## 2023-10-09 DIAGNOSIS — D0512 Intraductal carcinoma in situ of left breast: Secondary | ICD-10-CM

## 2023-10-09 DIAGNOSIS — E7800 Pure hypercholesterolemia, unspecified: Secondary | ICD-10-CM

## 2023-10-09 DIAGNOSIS — J329 Chronic sinusitis, unspecified: Secondary | ICD-10-CM

## 2023-10-09 DIAGNOSIS — F439 Reaction to severe stress, unspecified: Secondary | ICD-10-CM

## 2023-10-09 DIAGNOSIS — N390 Urinary tract infection, site not specified: Secondary | ICD-10-CM | POA: Diagnosis not present

## 2023-10-09 DIAGNOSIS — R600 Localized edema: Secondary | ICD-10-CM

## 2023-10-09 MED ORDER — ALBUTEROL SULFATE HFA 108 (90 BASE) MCG/ACT IN AERS: 2.0000 | INHALATION_SPRAY | Freq: Four times a day (QID) | RESPIRATORY_TRACT | 0 refills | Status: DC | PRN

## 2023-10-09 MED ORDER — DOXYCYCLINE HYCLATE 100 MG PO TABS
100.0000 mg | ORAL_TABLET | Freq: Two times a day (BID) | ORAL | 0 refills | Status: DC
Start: 2023-10-09 — End: 2023-10-17

## 2023-10-09 NOTE — Assessment & Plan Note (Addendum)
The 10-year ASCVD risk score (Arnett DK, et al., 2019) is: 2.2%   Values used to calculate the score:     Age: 57 years     Sex: Female     Is Non-Hispanic African American: No     Diabetic: No     Tobacco smoker: No     Systolic Blood Pressure: 122 mmHg     Is BP treated: No     HDL Cholesterol: 68.8 mg/dL     Total Cholesterol: 239 mg/dL  Low cholesterol diet and exercise.  Follow lipid panel.

## 2023-10-09 NOTE — Patient Instructions (Addendum)
Saline nasal spray - flush nose 1-2 x/day  Nasacort nasal spray (or flonase) - 2 sprays each nostril one time per day.  Do this in the evening.   Delsym cough syrup

## 2023-10-15 ENCOUNTER — Encounter: Payer: Self-pay | Admitting: Internal Medicine

## 2023-10-15 DIAGNOSIS — J329 Chronic sinusitis, unspecified: Secondary | ICD-10-CM | POA: Insufficient documentation

## 2023-10-15 NOTE — Assessment & Plan Note (Signed)
Increased sinus pressure, congestion and cough as outlined.  Present now for two weeks.  Saline nasal spray/steroid nasal spray and delsym as directed.  Doxycycline as directed.  Follow.  Call with update.

## 2023-10-15 NOTE — Assessment & Plan Note (Signed)
Seeing urology - history of recurring UTIs.  Recommended macrobid for 3 months.  S/p interstim placement.  Doing better overall.

## 2023-10-15 NOTE — Assessment & Plan Note (Signed)
Dr Lucky Cowboy 10/04/22 - venous reflux study.  Compression hose.

## 2023-10-15 NOTE — Assessment & Plan Note (Signed)
Overall appears to be handling things relatively well.  Follow.   

## 2023-10-15 NOTE — Assessment & Plan Note (Signed)
Left DCIS, grade 1-2. Status post lumpectomy and adjuvant radiation. Not able to tolerate aromatase inhibitor.  Currently off endocrine therapy. Continue surveillance with annual mammogram. Continue annual mammogram screening. (Dr Yu - 10/14/22) 

## 2023-10-15 NOTE — Assessment & Plan Note (Signed)
Colonoscopy 07/2018 - diverticulum and internal hemorrhoids. Referred back to GI for f/u colonoscopy.  Due screening. Has appt 12/2023.

## 2023-10-16 ENCOUNTER — Other Ambulatory Visit: Payer: Self-pay

## 2023-10-16 ENCOUNTER — Other Ambulatory Visit: Payer: BC Managed Care – PPO

## 2023-10-16 ENCOUNTER — Ambulatory Visit: Payer: BC Managed Care – PPO | Admitting: Oncology

## 2023-10-16 DIAGNOSIS — M8580 Other specified disorders of bone density and structure, unspecified site: Secondary | ICD-10-CM

## 2023-10-17 ENCOUNTER — Inpatient Hospital Stay: Payer: BC Managed Care – PPO | Attending: Oncology

## 2023-10-17 ENCOUNTER — Inpatient Hospital Stay (HOSPITAL_BASED_OUTPATIENT_CLINIC_OR_DEPARTMENT_OTHER): Payer: BC Managed Care – PPO | Admitting: Oncology

## 2023-10-17 ENCOUNTER — Encounter: Payer: Self-pay | Admitting: Oncology

## 2023-10-17 VITALS — BP 115/65 | HR 61 | Temp 97.5°F | Resp 18 | Wt 159.3 lb

## 2023-10-17 DIAGNOSIS — D0512 Intraductal carcinoma in situ of left breast: Secondary | ICD-10-CM | POA: Diagnosis not present

## 2023-10-17 DIAGNOSIS — M8580 Other specified disorders of bone density and structure, unspecified site: Secondary | ICD-10-CM | POA: Diagnosis not present

## 2023-10-17 DIAGNOSIS — Z803 Family history of malignant neoplasm of breast: Secondary | ICD-10-CM | POA: Diagnosis not present

## 2023-10-17 LAB — CBC WITH DIFFERENTIAL (CANCER CENTER ONLY)
Abs Immature Granulocytes: 0.01 10*3/uL (ref 0.00–0.07)
Basophils Absolute: 0.1 10*3/uL (ref 0.0–0.1)
Basophils Relative: 2 %
Eosinophils Absolute: 0.1 10*3/uL (ref 0.0–0.5)
Eosinophils Relative: 2 %
HCT: 40.7 % (ref 36.0–46.0)
Hemoglobin: 13.4 g/dL (ref 12.0–15.0)
Immature Granulocytes: 0 %
Lymphocytes Relative: 41 %
Lymphs Abs: 1.6 10*3/uL (ref 0.7–4.0)
MCH: 30.2 pg (ref 26.0–34.0)
MCHC: 32.9 g/dL (ref 30.0–36.0)
MCV: 91.9 fL (ref 80.0–100.0)
Monocytes Absolute: 0.5 10*3/uL (ref 0.1–1.0)
Monocytes Relative: 13 %
Neutro Abs: 1.7 10*3/uL (ref 1.7–7.7)
Neutrophils Relative %: 42 %
Platelet Count: 273 10*3/uL (ref 150–400)
RBC: 4.43 MIL/uL (ref 3.87–5.11)
RDW: 12.6 % (ref 11.5–15.5)
WBC Count: 4 10*3/uL (ref 4.0–10.5)
nRBC: 0 % (ref 0.0–0.2)

## 2023-10-17 LAB — CMP (CANCER CENTER ONLY)
ALT: 17 U/L (ref 0–44)
AST: 15 U/L (ref 15–41)
Albumin: 4 g/dL (ref 3.5–5.0)
Alkaline Phosphatase: 95 U/L (ref 38–126)
Anion gap: 8 (ref 5–15)
BUN: 19 mg/dL (ref 6–20)
CO2: 26 mmol/L (ref 22–32)
Calcium: 8.7 mg/dL — ABNORMAL LOW (ref 8.9–10.3)
Chloride: 104 mmol/L (ref 98–111)
Creatinine: 0.73 mg/dL (ref 0.44–1.00)
GFR, Estimated: >60 mL/min (ref >60)
Glucose, Bld: 102 mg/dL — ABNORMAL HIGH (ref 70–99)
Potassium: 3.9 mmol/L (ref 3.5–5.1)
Sodium: 138 mmol/L (ref 135–145)
Total Bilirubin: 0.8 mg/dL (ref <1.2)
Total Protein: 7.2 g/dL (ref 6.5–8.1)

## 2023-10-17 NOTE — Assessment & Plan Note (Signed)
#  Osteopenia, bone density was on 04/23/2020. Continue calcium and vitamin D supplementation.  Not on any adjuvant endocrine therapy.   I defer to her primary care provider for decision of bisphosphonate treatment and surveillance.  Marland Kitchen

## 2023-10-17 NOTE — Assessment & Plan Note (Addendum)
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 Patient did not tolerate aromatase inhibitor.  Currently off endocrine therapy. Recent bilateral mammogram results were reviewed with patient.  Continue annual mammogram screening. - ordered by PCP

## 2023-10-17 NOTE — Progress Notes (Signed)
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 Patient did not tolerate aromatase inhibitor.  Currently off endocrine therapy. Recent bilateral mammogram results were reviewed with patient.  Continue annual mammogram screening.    Osteopenia #Osteopenia, bone density was on 04/23/2020. Continue calcium and vitamin D supplementation.  Not on any adjuvant endocrine therapy.   I defer to her primary care provider for decision of bisphosphonate treatment and surveillance.  .     No orders of the defined types were placed in this encounter.  Follow up  1 year lab MD cbc cmp + breast exam   All questions were answered. The patient knows to call the clinic with any problems, questions or concerns.  Rickard Patience, MD, PhD [facility] Hematology Oncology 10/17/2023   CHIEF COMPLAINTS/REASON FOR VISIT:  Follow up for DCIS  HISTORY OF PRESENTING ILLNESS:   Rose Bernard is a  57 y.o.  female with PMH listed below was seen in consultation at the request of  D[place], MD  for evaluation of DCIS. 10/15/2019 screening bilateral mammogram showed possible left breast mass and calcification. 10/25/2019 patient had unilateral diagnostic mammogram of left breast which showed indeterminate mass in the left breast at 4:00.  Patient underwent ultrasound-guided biopsy of the left breast mass 12/12/2019 left breast ultrasound-guided biopsy showed atypical ductal hyperplastic. Patient was evaluated by Dr. Maia Plan and had left breast mass excisional biopsy on 01/10/2020. Pathology showed DCIS, nuclear grade 1-2, background columnar cell changes, papillary change, fibrocystic changes.  Size of the DCIS 21 mm, margins positive for DCIS in the anterior and inferior multifocal.  Patient was referred to cancer center for discussion of further  management. Today she denies any new complaints.  Some soreness at the site of excisional biopsy. Menarche age 54. Patient had a hysterectomy at age of 17. She takes estradiol for hot flash symptoms for about a year half. Family history positive for maternal aunt and paternal aunt were both diagnosed with breast cancer, her father was diagnosed with colon cancer.  She had remote breast biopsy in 1991.    01/10/20 Initial lumpectomy showed positive DCIS margin. 01/31/20 Patient underwent reexcision and achieved a negative margin. Patient finished adjuvant radiation on 03/30/2020.  # Patient started on Arimidex around 04/18/2020 and after taking 2 days, she noticed left ankle swelling for about a week.  She presented to the emergency room on 04/25/2020 for evaluation.There was no DVT., however there does appear to be a superficial thrombophlebitis involving medial superficial veins overlying the ankle. Patient was advised to try NSAIDs and compression stocking.   Family history of cancer, declined genetic testing. Family history of thrombosis and personal history of superficial thrombophlebitis.  She had hypercoagulable work-up done including negative antiphospholipid antibody profile, negative factor V Leiden gene mutation, negative prothrombin gene mutation.   Patient was last seen by me on 05/12/2020.  At that time patient has developed superficial thrombophlebitis. patient then switched care to [facility] for second opinion.  Patient was initially recommended to resume Arimidex.  Patient developed a headache and a bone pain and was switched to Exemestane.  Per care everywhere, no additional documented records of another visit with hematology oncology there.  Per patient, she was not able to tolerate Exemestane as well and eventually she was recommended by [facility] to stay off medication.  Patient was seen by primary care  provider Dr. Lorin Picket and bilateral diagnostic mammogram was obtained on 10/11/2021. Stable  left lumpectomy site.  No evidence of malignancy.  09/15/2021, patient was diagnosed of plantar fasciitis.  Patient was recommended to wear plantar fascial brace.  10/15/2021 Re- establish care with me for follow-up of DCIS.   INTERVAL HISTORY Rose Bernard is a 57 y.o. female who has above history reviewed by me today presents to reestablish care for history of DCIS Patient did not tolerate aromatase inhibitor, off endocrine therapy.  She has no new breast concern today.  On mabrobid long term for UTI prevention.  Pacemaker placement.   Review of Systems  Constitutional:  Negative for appetite change, chills, fatigue and fever.  HENT:   Negative for hearing loss and voice change.   Eyes:  Negative for eye problems.  Respiratory:  Negative for chest tightness and cough.   Cardiovascular:  Negative for chest pain.  Gastrointestinal:  Negative for abdominal distention, abdominal pain and blood in stool.  Endocrine: Negative for hot flashes.  Genitourinary:  Negative for difficulty urinating and frequency.   Musculoskeletal:  Negative for arthralgias.  Skin:  Negative for itching and rash.  Neurological:  Negative for extremity weakness.  Hematological:  Negative for adenopathy.  Psychiatric/Behavioral:  Negative for confusion.     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 Hysterectomy and bladder repair with subsequent appendectomy. L Ovary remain in place    APPENDECTOMY     2001 due to scar tissue    BLADDER BOTOX  2014, 2015, 2016   BREAST BIOPSY Bilateral 1991   BREAST BIOPSY Left 12/12/2019   [place] bx, path pending   BREAST LUMPECTOMY     BREAST SURGERY Bilateral 2002   benign mass removed   COLONOSCOPY  2014   EXCISION OF BREAST BIOPSY Left 01/10/2020   Procedure: EXCISION OF BREAST BIOPSY w/ Needle localization;  Surgeon: Carolan Shiver, MD;  Location: [facility] ORS;  Service: General;  Laterality: Left;   FOOT SURGERY Bilateral    INTERSTIM IMPLANT PLACEMENT N/A 06/19/2023   Procedure: Leane Platt IMPLANT FIRST STAGE WITH IMPEDENCE CHECK;  Surgeon: Alfredo Martinez, MD;  Location: [facility] ORS;  Service: Urology;  Laterality: N/A;   INTERSTIM IMPLANT PLACEMENT N/A 06/19/2023   Procedure: Leane Platt IMPLANT SECOND STAGE;  Surgeon: Alfredo Martinez, MD;  Location: [facility] ORS;  Service: Urology;  Laterality: N/A;   LAPAROSCOPIC HYSTERECTOMY  1995   MASTECTOMY, PARTIAL Left 01/31/2020   Procedure: MASTECTOMY PARTIAL;  Surgeon: Carolan Shiver, MD;  Location: [facility] ORS;  Service: General;  Laterality: Left;   reconstruction of bladder   1995   2/2 delivery complications with 3rd child     SOCIAL HISTORY: Social History   Socioeconomic History   Marital status: Married    Spouse name: Not on file   Number of children: 2   Years of education: Not on file   Highest education level: Not on file  Occupational History   Not on file  Tobacco Use  Smoking status: Never    Passive exposure: Never   Smokeless tobacco: Never  Vaping Use   Vaping status: Never Used  Substance and Sexual Activity   Alcohol use: No    Alcohol/week: 0.0 standard drinks of alcohol   Drug use: No   Sexual activity: Yes    Birth control/protection: Surgical  Other Topics Concern   Not on file  Social History Narrative   Not on file   Social Determinants of Health   Financial Resource Strain: Not on file  Food Insecurity: Not on file  Transportation Needs: Not on file  Physical Activity: Not on  file  Stress: Not on file  Social Connections: Not on file  Intimate Partner Violence: Not on file    FAMILY HISTORY: Family History  Problem Relation Age of Onset   Colon cancer Father        With ostomy in place    Breast cancer Paternal Aunt 46   Breast cancer Maternal Aunt    Breast cancer Maternal Aunt     ALLERGIES:  is allergic to trimpex [trimethoprim] and penicillin v potassium.  MEDICATIONS:  Current Outpatient Medications  Medication Sig Dispense Refill   Azelastine HCl 137 MCG/SPRAY SOLN Place 1 spray into both nostrils 2 (two) times daily. 90 mL 0   fluticasone (FLONASE) 50 MCG/ACT nasal spray Place 2 sprays into both nostrils daily. 48 g 1   nitrofurantoin, macrocrystal-monohydrate, (MACROBID) 100 MG capsule Take 1 capsule (100 mg total) by mouth daily. 30 capsule 0   albuterol (VENTOLIN HFA) 108 (90 Base) MCG/ACT inhaler Inhale 2 puffs into the lungs every 6 (six) hours as needed for wheezing or shortness of breath. (Patient not taking: Reported on 10/17/2023) 18 g 0   [START ON 12/01/2023] ALPRAZolam (XANAX) 0.5 MG tablet Take 1 tab one hour before procedure and 1 tab when you arrive in office (Patient not taking: Reported on 10/17/2023) 2 tablet 0   estradiol (ESTRACE) 0.1 MG/GM vaginal cream Apply one pea-sized amount around the opening of the urethra daily for 2 weeks, then 3 times weekly moving forward. (Patient not taking: Reported on 10/17/2023) 42.5 g 12   HYDROcodone-acetaminophen (NORCO) 5-325 MG tablet Take 1-2 tablets by mouth every 6 (six) hours as needed for moderate pain. (Patient not taking: Reported on 10/17/2023) 14 tablet 0   nitrofurantoin, macrocrystal-monohydrate, (MACROBID) 100 MG capsule Take 1 capsule (100 mg total) by mouth daily. (Patient not taking: Reported on 10/17/2023) 90 capsule 3   vitamin E 1000 UNIT capsule Take 1,000 Units by mouth daily. (Patient not taking: Reported on 10/17/2023)     No current facility-administered medications for this  visit.     PHYSICAL EXAMINATION: ECOG PERFORMANCE STATUS: 1 - Symptomatic but completely ambulatory Vitals:   10/17/23 1037  BP: 115/65  Pulse: 61  Resp: 18  Temp: (!) 97.5 F (36.4 C)   Filed Weights   10/17/23 1037  Weight: 159 lb 4.8 oz (72.3 kg)    Physical Exam Constitutional:      General: She is not in acute distress. HENT:     Head: Normocephalic and atraumatic.  Eyes:     General: No scleral icterus. Cardiovascular:     Rate and Rhythm: Normal rate and regular rhythm.     Heart sounds: Normal heart sounds.  Pulmonary:     Effort: Pulmonary effort is normal. No respiratory distress.     Breath sounds: No wheezing.  Abdominal:     General: Bowel sounds are normal.  There is no distension.     Palpations: Abdomen is soft.  Musculoskeletal:        General: No deformity. Normal range of motion.     Cervical back: Normal range of motion and neck supple.  Skin:    General: Skin is warm and dry.     Findings: No erythema or rash.  Neurological:     Mental Status: She is alert and oriented to person, place, and time. Mental status is at baseline.     Cranial Nerves: No cranial nerve deficit.     Coordination: Coordination normal.  Psychiatric:        Mood and Affect: Mood normal.    Breast exam was performed in seated and lying down position. Left breast status post lumpectomy, tissue thickening at the lumpectomy sites, with mild tenderness.  Otherwise no palpable discrete masses bilaterally.  No palpable axillary lymphadenopathy bilaterally.   LABORATORY DATA:  I have reviewed the data as listed     Latest Ref Rng & Units 10/17/2023   10:23 AM 04/05/2023    3:56 PM 03/13/2023    9:55 AM  CBC  WBC 4.0 - 10.5 K/uL 4.0  5.6  3.8   Hemoglobin 12.0 - 15.0 g/dL 10.9  60.4  54.0   Hematocrit 36.0 - 46.0 % 40.7  36.9  39.5   Platelets 150 - 400 K/uL 273  283.0  309.0       Latest Ref Rng & Units 10/17/2023   10:22 AM 10/06/2023    8:02 AM 03/13/2023    9:55 AM   CMP  Glucose 70 - 99 mg/dL 981  82  90   BUN 6 - 20 mg/dL 19  16  17    Creatinine 0.44 - 1.00 mg/dL 1.91  4.78  2.95   Sodium 135 - 145 mmol/L 138  140  140   Potassium 3.5 - 5.1 mmol/L 3.9  4.1  3.6   Chloride 98 - 111 mmol/L 104  103  106   CO2 22 - 32 mmol/L 26  30  28    Calcium 8.9 - 10.3 mg/dL 8.7  9.0  8.8   Total Protein 6.5 - 8.1 g/dL 7.2  7.0  6.7   Total Bilirubin <1.2 mg/dL 0.8  0.5  0.6   Alkaline Phos 38 - 126 U/L 95  101  85   AST 15 - 41 U/L 15  17  14    ALT 0 - 44 U/L 17  24  12       RADIOGRAPHIC STUDIES: I have personally reviewed the radiological images as listed and agreed with the findings in the report.  No results found.

## 2023-10-17 NOTE — Progress Notes (Signed)
Pt here for follow up. Pt is currently on Macrobid long term for bladder infection prevention. Pt states she had pacemaker placed on June 19 2023. No new breast problems

## 2023-10-19 ENCOUNTER — Encounter: Payer: Self-pay | Admitting: Internal Medicine

## 2023-10-20 MED ORDER — DOXYCYCLINE HYCLATE 100 MG PO TABS
100.0000 mg | ORAL_TABLET | Freq: Two times a day (BID) | ORAL | 0 refills | Status: DC
Start: 2023-10-20 — End: 2024-01-22

## 2023-10-20 NOTE — Telephone Encounter (Signed)
I have sent in rx for another week of doxycycline.  Make sure to take a probiotic daily while on abx and for two weeks after completing.  If persistent symptoms, will need to be evaluated.

## 2023-10-20 NOTE — Telephone Encounter (Signed)
Called patient. She says her symptoms were starting to get better on the doxy but did not completely resolve before she ran out of abx. Has not used anything else.

## 2023-10-20 NOTE — Telephone Encounter (Signed)
Did symptoms get better and go away and now returning?  What other medication is she taking for her sinus?  Any other symptoms?

## 2023-10-23 ENCOUNTER — Ambulatory Visit
Admission: RE | Admit: 2023-10-23 | Discharge: 2023-10-23 | Disposition: A | Discharge status: Home / Self Care | Payer: BC Managed Care – PPO | Source: Ambulatory Visit | Attending: Internal Medicine | Admitting: Internal Medicine

## 2023-10-23 DIAGNOSIS — Z1231 Encounter for screening mammogram for malignant neoplasm of breast: Secondary | ICD-10-CM | POA: Diagnosis present

## 2023-10-25 ENCOUNTER — Other Ambulatory Visit: Payer: Self-pay | Admitting: Internal Medicine

## 2023-10-25 DIAGNOSIS — R928 Other abnormal and inconclusive findings on diagnostic imaging of breast: Secondary | ICD-10-CM

## 2023-10-25 NOTE — Progress Notes (Signed)
Order placed for left breast mammogram and ultrasound.  

## 2023-10-26 ENCOUNTER — Telehealth: Payer: Self-pay | Admitting: *Deleted

## 2023-10-26 NOTE — Telephone Encounter (Signed)
Left voicemail to return call or check mychart  See message below

## 2023-10-26 NOTE — Telephone Encounter (Signed)
-----   Message from [place] sent at 10/25/2023  2:40 PM EST ----- Please notify - I reviewed her mammogram report and radiology is recommending f/u left breast mammogram and possible ultrasound.  The order has been placed.  Someone should be contacting her with an appt date and time. Let us know if not scheduled.

## 2023-11-01 ENCOUNTER — Ambulatory Visit
Admission: RE | Admit: 2023-11-01 | Discharge: 2023-11-01 | Disposition: A | Discharge status: Home / Self Care | Payer: BC Managed Care – PPO | Source: Ambulatory Visit | Attending: Internal Medicine | Admitting: Internal Medicine

## 2023-11-01 ENCOUNTER — Encounter: Payer: Self-pay | Admitting: Internal Medicine

## 2023-11-01 DIAGNOSIS — Z86000 Personal history of in-situ neoplasm of breast: Secondary | ICD-10-CM | POA: Diagnosis not present

## 2023-11-01 DIAGNOSIS — R928 Other abnormal and inconclusive findings on diagnostic imaging of breast: Secondary | ICD-10-CM

## 2023-11-01 DIAGNOSIS — R92333 Mammographic heterogeneous density, bilateral breasts: Secondary | ICD-10-CM | POA: Diagnosis not present

## 2023-11-01 DIAGNOSIS — N6002 Solitary cyst of left breast: Secondary | ICD-10-CM | POA: Diagnosis not present

## 2023-11-01 NOTE — Telephone Encounter (Signed)
Pt called about mychart message 

## 2023-11-02 NOTE — Addendum Note (Signed)
Addended by: Charm Barges on: 11/02/2023 07:51 PM   Modules accepted: Orders

## 2023-11-02 NOTE — Telephone Encounter (Signed)
Please see result note for documentation 

## 2023-11-02 NOTE — Telephone Encounter (Signed)
Order has been placed for MRI breast (left) - someone should be contacting her with an appt date and time.

## 2023-11-06 ENCOUNTER — Telehealth: Payer: Self-pay | Admitting: Internal Medicine

## 2023-11-06 NOTE — Telephone Encounter (Signed)
Lft pt vm to call ofc to sch [place]. thanks

## 2023-11-06 NOTE — Telephone Encounter (Signed)
Correction: MRI not [place].

## 2023-11-07 ENCOUNTER — Other Ambulatory Visit: Payer: Self-pay | Admitting: Internal Medicine

## 2023-11-07 DIAGNOSIS — R928 Other abnormal and inconclusive findings on diagnostic imaging of breast: Secondary | ICD-10-CM

## 2023-11-07 NOTE — Progress Notes (Signed)
Order placed for MRI breasts

## 2023-11-08 ENCOUNTER — Telehealth: Payer: Self-pay | Admitting: Internal Medicine

## 2023-11-08 NOTE — Telephone Encounter (Signed)
Lft pt vm to call ofc regarding ins.thanks

## 2023-11-15 ENCOUNTER — Telehealth (INDEPENDENT_AMBULATORY_CARE_PROVIDER_SITE_OTHER): Payer: Self-pay

## 2023-11-15 NOTE — Telephone Encounter (Signed)
Patient called with a few questions about the laser ablation taking place on 12/08/23. She want to discuss when to take the pills, and if her regular compressions are fine.

## 2023-11-20 ENCOUNTER — Encounter: Payer: Self-pay | Admitting: Internal Medicine

## 2023-11-21 ENCOUNTER — Encounter: Payer: Self-pay | Admitting: Internal Medicine

## 2023-11-22 ENCOUNTER — Ambulatory Visit
Admission: RE | Admit: 2023-11-22 | Discharge: 2023-11-22 | Disposition: A | Discharge status: Home / Self Care | Payer: BC Managed Care – PPO | Source: Ambulatory Visit | Attending: Internal Medicine | Admitting: Internal Medicine

## 2023-11-22 DIAGNOSIS — R928 Other abnormal and inconclusive findings on diagnostic imaging of breast: Secondary | ICD-10-CM

## 2023-11-23 ENCOUNTER — Ambulatory Visit
Admission: RE | Admit: 2023-11-23 | Discharge: 2023-11-23 | Disposition: A | Discharge status: Home / Self Care | Payer: 59 | Source: Ambulatory Visit | Attending: Internal Medicine | Admitting: Internal Medicine

## 2023-11-23 DIAGNOSIS — R928 Other abnormal and inconclusive findings on diagnostic imaging of breast: Secondary | ICD-10-CM | POA: Diagnosis present

## 2023-11-23 MED ORDER — GADOBUTROL 1 MMOL/ML IV SOLN
6.0000 mL | Freq: Once | INTRAVENOUS | Status: AC | PRN
  Administered 2023-11-23: 6 mL via INTRAVENOUS

## 2023-11-24 ENCOUNTER — Other Ambulatory Visit (HOSPITAL_COMMUNITY): Payer: BC Managed Care – PPO

## 2023-11-24 ENCOUNTER — Other Ambulatory Visit: Payer: BC Managed Care – PPO

## 2023-11-25 ENCOUNTER — Other Ambulatory Visit: Payer: Self-pay | Admitting: Internal Medicine

## 2023-11-25 DIAGNOSIS — R928 Other abnormal and inconclusive findings on diagnostic imaging of breast: Secondary | ICD-10-CM

## 2023-11-25 NOTE — Progress Notes (Signed)
Order placed for f/u left breast mammogram and ultrasound.   

## 2023-11-28 DIAGNOSIS — M25541 Pain in joints of right hand: Secondary | ICD-10-CM | POA: Diagnosis not present

## 2023-11-28 DIAGNOSIS — M1811 Unilateral primary osteoarthritis of first carpometacarpal joint, right hand: Secondary | ICD-10-CM | POA: Diagnosis not present

## 2023-12-08 ENCOUNTER — Encounter (INDEPENDENT_AMBULATORY_CARE_PROVIDER_SITE_OTHER): Payer: Self-pay | Admitting: Vascular Surgery

## 2023-12-08 ENCOUNTER — Ambulatory Visit (INDEPENDENT_AMBULATORY_CARE_PROVIDER_SITE_OTHER): Payer: 59 | Admitting: Vascular Surgery

## 2023-12-08 VITALS — BP 126/72 | HR 76 | Resp 18

## 2023-12-08 DIAGNOSIS — I83811 Varicose veins of right lower extremities with pain: Secondary | ICD-10-CM | POA: Diagnosis not present

## 2023-12-08 NOTE — Progress Notes (Signed)
Varicose vein of leg     The patient's right lower extremity was sterilely prepped and draped. The ultrasound machine was used to visualize the lesser saphenous vein throughout its course. A segment mid calf was selected for access. The lesser saphenous vein was accessed with a small amount of difficulty using ultrasound guidance with a micro puncture needle. A 0.018 wire was placed beyond the small saphenous-popliteal junction. The 65cm sheath was placed over the wire and the wire and dilator were removed. The laser fiber was placed through the sheath and its tip was placed approximately 5 cm below the small saphenous-popliteal junction. Tumescent anesthesia was then created with a dilute lidocaine solution. Laser energy was then delivered with constant withdrawal of the sheath and laser fiber. Approximately 709 Joules of energy were delivered over a length of 17 cm using the 1470 Hz Venocare machine at [employer]. Sterile dressings were placed. The patient tolerated the procedure well without complications.

## 2023-12-12 ENCOUNTER — Other Ambulatory Visit (INDEPENDENT_AMBULATORY_CARE_PROVIDER_SITE_OTHER): Payer: Self-pay | Admitting: Vascular Surgery

## 2023-12-12 DIAGNOSIS — I83811 Varicose veins of right lower extremities with pain: Secondary | ICD-10-CM

## 2023-12-15 ENCOUNTER — Ambulatory Visit (INDEPENDENT_AMBULATORY_CARE_PROVIDER_SITE_OTHER): Payer: 59

## 2023-12-15 DIAGNOSIS — I83811 Varicose veins of right lower extremities with pain: Secondary | ICD-10-CM | POA: Diagnosis not present

## 2024-01-01 DIAGNOSIS — Z1211 Encounter for screening for malignant neoplasm of colon: Secondary | ICD-10-CM | POA: Diagnosis not present

## 2024-01-01 DIAGNOSIS — Z800 Family history of malignant neoplasm of digestive organs: Secondary | ICD-10-CM | POA: Diagnosis not present

## 2024-01-02 ENCOUNTER — Ambulatory Visit (INDEPENDENT_AMBULATORY_CARE_PROVIDER_SITE_OTHER): Payer: BC Managed Care – PPO | Admitting: Vascular Surgery

## 2024-01-08 ENCOUNTER — Ambulatory Visit: Payer: BC Managed Care – PPO | Admitting: Urology

## 2024-01-09 ENCOUNTER — Ambulatory Visit (INDEPENDENT_AMBULATORY_CARE_PROVIDER_SITE_OTHER): Payer: BC Managed Care – PPO | Admitting: Vascular Surgery

## 2024-01-09 ENCOUNTER — Encounter (INDEPENDENT_AMBULATORY_CARE_PROVIDER_SITE_OTHER): Payer: Self-pay | Admitting: Vascular Surgery

## 2024-01-09 VITALS — BP 137/72 | HR 69 | Resp 18 | Ht 66.0 in | Wt 166.0 lb

## 2024-01-09 DIAGNOSIS — I890 Lymphedema, not elsewhere classified: Secondary | ICD-10-CM

## 2024-01-09 DIAGNOSIS — I872 Venous insufficiency (chronic) (peripheral): Secondary | ICD-10-CM | POA: Diagnosis not present

## 2024-01-09 NOTE — Assessment & Plan Note (Signed)
Patient has longstanding lymphedema which is moderate to severe and at least stage II.  Continue compression socks, elevation, and activity as tolerated.  Follow-up in 3 to 4 months.

## 2024-01-09 NOTE — Assessment & Plan Note (Signed)
Patient underwent successful laser ablation of the right small saphenous vein.  She has had mild improvement in her symptoms.  She still has chronic swelling after previous ankle surgeries and definitely has a significant lymphedema component.  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  Plan to follow-up in 3 to 4 months.

## 2024-01-09 NOTE — Progress Notes (Signed)
MRN : [id number]  Rose Bernard is a 58 y.o. ([birth year]) female who presents with chief complaint of  Chief Complaint  Patient presents with   Follow-up    R leg SSV 4 week post laser f/u  .  History of Present Illness: Patient returns today in follow up of venous insufficiency as well as lymphedema.  About a month ago, she underwent successful laser ablation of the right small saphenous vein.  Postprocedural duplex showed a successful ablation without DVT.  This has resulted in mild improvement in her right leg swelling and discomfort but she still has persistent swelling.  This is not entirely surprising as she has known lymphedema after multiple previous foot and ankle surgeries.  Her bruising and soreness lasted about 2 to 3 weeks.  This has resolved now.  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  Current Outpatient Medications  Medication Sig Dispense Refill   Azelastine HCl 137 MCG/SPRAY SOLN Place 1 spray into both nostrils 2 (two) times daily. 90 mL 0   fluticasone (FLONASE) 50 MCG/ACT nasal spray Place 2 sprays into both nostrils daily. 48 g 1   nitrofurantoin, macrocrystal-monohydrate, (MACROBID) 100 MG capsule Take 1 capsule (100 mg total) by mouth daily. 30 capsule 0   Vitamin E (VITAMIN E/D-ALPHA NATURAL) 268 MG (400 UNIT) CAPS Take by mouth.     doxycycline (VIBRA-TABS) 100 MG tablet Take 1 tablet (100 mg total) by mouth 2 (two) times daily. (Patient not taking: Reported on 01/09/2024) 14 tablet 0   estradiol (ESTRACE) 0.1 MG/GM vaginal cream Apply one pea-sized amount around the opening of the urethra daily for 2 weeks, then 3 times weekly moving forward. (Patient not taking: Reported on 10/17/2023) 42.5 g 12   HYDROcodone-acetaminophen (NORCO) 5-325 MG tablet Take 1-2 tablets by mouth every 6 (six) hours as needed for moderate pain. (Patient not taking: Reported on 10/17/2023) 14 tablet 0    nitrofurantoin, macrocrystal-monohydrate, (MACROBID) 100 MG capsule Take 1 capsule (100 mg total) by mouth daily. (Patient not taking: Reported on 10/17/2023) 90 capsule 3   No current facility-administered medications for this visit.    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 Hysterectomy and bladder repair with subsequent appendectomy. L Ovary remain in place    APPENDECTOMY     2001 due to scar tissue   BLADDER BOTOX  2014, 2015, 2016   BREAST BIOPSY Bilateral 1991   BREAST BIOPSY Left 12/12/2019   [place] bx, ATYPICAL DUCTAL HYPERPLASIA.   ADDITIONAL FINDINGS: FIBROCYSTIC CHANGES, COLUMNAR CELL CHANGE, STROMAL  SCLEROSIS, USUAL DUCTAL HYPERPLASIA.   BREAST LUMPECTOMY     BREAST SURGERY Bilateral 2002   benign mass removed   COLONOSCOPY  2014   EXCISION OF BREAST BIOPSY Left 01/10/2020   Procedure: EXCISION OF BREAST BIOPSY w/ Needle localization;  Surgeon: Carolan Shiver, MD;  Location: [facility] ORS;  Service: General;  Laterality: Left;   FOOT  SURGERY Bilateral    INTERSTIM IMPLANT PLACEMENT N/A 06/19/2023   Procedure: Leane Platt IMPLANT FIRST STAGE WITH IMPEDENCE CHECK;  Surgeon: Alfredo Martinez, MD;  Location: [facility] OR[facility];  Service: Urology;  Laterality: N/A;   INTERSTIM IMPLANT PLACEMENT N/A 06/19/2023   Procedure: Leane Platt IMPLANT SECOND STAGE;  Surgeon: Alfredo Martinez, MD;  Location: [facility] OR[facility];  Service: Urology;  Laterality: N/A;   LAPAROSCOPIC HYSTERECTOMY  1995   MASTECTOMY, PARTIAL Left 01/31/2020   Procedure: MASTECTOMY PARTIAL;   Surgeon: Carolan Shiver, MD;  Location: [facility] ORS;  Service: General;  Laterality: Left;   reconstruction of bladder   1995   2/2 delivery complications with 3rd child      Social History   Tobacco Use   Smoking status: Never    Passive exposure: Never   Smokeless tobacco: Never  Vaping Use   Vaping status: Never Used  Substance Use Topics   Alcohol use: No    Alcohol/week: 0.0 standard drinks of alcohol   Drug use: No       Family History  Problem Relation Age of Onset   Colon cancer Father        With ostomy in place    Breast cancer Paternal Aunt 27   Breast cancer Maternal Aunt    Breast cancer Maternal Aunt      Allergies  Allergen Reactions   Trimpex [Trimethoprim] Nausea Only   Penicillin V Potassium Rash    Did it involve swelling of the face/tongue/throat, SOB, or low BP? No Did it involve sudden or severe rash/hives, skin peeling, or any reaction on the inside of your mouth or nose? No Did you need to seek medical attention at a hospital or doctor's office? No When did it last happen?      25 years ago If all above answers are "NO", may proceed with cephalosporin use.      REVIEW OF SYSTEMS (Negative unless checked)  Constitutional: [] Weight loss  [] Fever  [] Chills Cardiac: [] Chest pain   [] Chest pressure   [] Palpitations   [] Shortness of breath when laying flat   [] Shortness of breath at rest   [] Shortness of breath with exertion. Vascular:  [] Pain in legs with walking   [] Pain in legs at rest   [] Pain in legs when laying flat   [] Claudication   [] Pain in feet when walking  [] Pain in feet at rest  [] Pain in feet when laying flat   [x] History of DVT   [] Phlebitis   [x] Swelling in legs   [x] Varicose veins   [] Non-healing ulcers Pulmonary:   [] Uses home oxygen   [] Productive cough   [] Hemoptysis   [] Wheeze  [] COPD   [] Asthma Neurologic:  [] Dizziness  [] Blackouts   [] Seizures   [] History of stroke   [] History of TIA  [] Aphasia   [] Temporary blindness    [] Dysphagia   [] Weakness or numbness in arms   [] Weakness or numbness in legs Musculoskeletal:  [x] Arthritis   [] Joint swelling   [] Joint pain   [] Low back pain Hematologic:  [] Easy bruising  [] Easy bleeding   [] Hypercoagulable state   [] Anemic   Gastrointestinal:  [] Blood in stool   [] Vomiting blood  [] Gastroesophageal reflux/heartburn   [] Abdominal pain Genitourinary:  [] Chronic kidney disease   [] Difficult urination  [] Frequent urination  [] Burning with urination   [] Hematuria Skin:  [] Rashes   [] Ulcers   [] Wounds Psychological:  [] History of anxiety   []  History of major depression.  Physical Examination  BP 137/72   Pulse 69   Resp 18  Ht 5\' 6"  (1.676 m)   Wt 166 lb (75.3 kg)   LMP 02/07/1994   BMI 26.79 kg/m  Gen:  WD/WN, NAD Head: [place]/AT, No temporalis wasting. Ear/Nose/Throat: Hearing grossly intact, nares w/o erythema or drainage Eyes: Conjunctiva clear. Sclera non-icteric Neck: Supple.  Trachea midline Pulmonary:  Good air movement, no use of accessory muscles.  Cardiac: RRR, no JVD Vascular:  Vessel Right Left  Radial Palpable Palpable           Musculoskeletal: M/S 5/5 throughout.  No deformity or atrophy. 1+ RLE edema.  Fairly prominent varicosities are present in the right posterior lower leg but these are mostly superficial spider varicosities less than 1 to 2 mm in diameter. Neurologic: Sensation grossly intact in extremities.  Symmetrical.  Speech is fluent.  Psychiatric: Judgment intact, Mood & affect appropriate for pt's clinical situation. Dermatologic: No rashes or ulcers noted.  No cellulitis or open wounds.      Labs Recent Results (from the past 2160 hours)  CMP (Cancer Center only)     Status: Abnormal   Collection Time: 10/17/23 10:22 AM  Result Value Ref Range   Sodium 138 135 - 145 mmol/L   Potassium 3.9 3.5 - 5.1 mmol/L   Chloride 104 98 - 111 mmol/L   CO2 26 22 - 32 mmol/L   Glucose, Bld 102 (H) 70 - 99 mg/dL    Comment: Glucose  reference range applies only to samples taken after fasting for at least 8 hours.   BUN 19 6 - 20 mg/dL   Creatinine 1.61 0.96 - 1.00 mg/dL   Calcium 8.7 (L) 8.9 - 10.3 mg/dL   Total Protein 7.2 6.5 - 8.1 g/dL   Albumin 4.0 3.5 - 5.0 g/dL   AST 15 15 - 41 U/L   ALT 17 0 - 44 U/L   Alkaline Phosphatase 95 38 - 126 U/L   Total Bilirubin 0.8 <1.2 mg/dL   GFR, Estimated >04 >54 mL/min    Comment: (NOTE) Calculated using the CKD-EPI Creatinine Equation (2021)    Anion gap 8 5 - 15    Comment: Performed at [facility], [address] [place], [place] [address]  CBC with Differential (Cancer Center Only)     Status: None   Collection Time: 10/17/23 10:23 AM  Result Value Ref Range   WBC Count 4.0 4.0 - 10.5 K/uL   RBC 4.43 3.87 - 5.11 MIL/uL   Hemoglobin 13.4 12.0 - 15.0 g/dL   HCT 91.4 78.2 - 95.6 %   MCV 91.9 80.0 - 100.0 fL   MCH 30.2 26.0 - 34.0 pg   MCHC 32.9 30.0 - 36.0 g/dL   RDW 21.3 08.6 - 57.8 %   Platelet Count 273 150 - 400 K/uL   nRBC 0.0 0.0 - 0.2 %   Neutrophils Relative % 42 %   Neutro Abs 1.7 1.7 - 7.7 K/uL   Lymphocytes Relative 41 %   Lymphs Abs 1.6 0.7 - 4.0 K/uL   Monocytes Relative 13 %   Monocytes Absolute 0.5 0.1 - 1.0 K/uL   Eosinophils Relative 2 %   Eosinophils Absolute 0.1 0.0 - 0.5 K/uL   Basophils Relative 2 %   Basophils Absolute 0.1 0.0 - 0.1 K/uL   Immature Granulocytes 0 %   Abs Immature Granulocytes 0.01 0.00 - 0.07 K/uL    Comment: Performed at [facility], [address] [place], [place] [address]    Radiology VAS [place] LOWER EXTREMITY VENOUS POST  ABLATION Result Date: 12/19/2023  Lower Venous Reflux Study Patient Name:  Rose Bernard  Date of Exam:   12/15/2023 Medical Rec #: [id number]          Accession #:    [id number] Date of Birth: March 30, 1966           Patient Gender: F Patient Age:   51 years Exam Location:  [facility] Procedure:      VAS [place] LOWER EXTREMITY VENOUS POST ABLATION Referring Phys: Barbara Cower Ronnell Makarewicz  --------------------------------------------------------------------------------  Indications: Varicosities. Other Indications: S/P Rt LSV ablation. Performing Technologist: Salvadore Farber RVT  Examination Guidelines: A complete evaluation includes B-mode imaging, spectral Doppler, color Doppler, and power Doppler as needed of all accessible portions of each vessel. Bilateral testing is considered an integral part of a complete examination. Limited examinations for reoccurring indications may be performed as noted. The reflux portion of the exam is performed with the patient in reverse Trendelenburg. Significant venous reflux is defined as >500 ms in the superficial venous system, and >1 second in the deep venous system.   Summary: Right: - Rt LSV closed s/p ablation. No evidence of DVT.  *See table(s) above for measurements and observations. Electronically signed by Festus Barren MD on 12/19/2023 at 3:13:29 PM.    Final     Assessment/Plan  Chronic venous insufficiency Patient underwent successful laser ablation of the right small saphenous vein.  She has had mild improvement in her symptoms.  She still has chronic swelling after previous ankle surgeries and definitely has a significant lymphedema component.  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  Plan to follow-up in 3 to 4 months.  Lymphedema Patient has longstanding lymphedema which is moderate to severe and at least stage II.  Continue compression socks, elevation, and activity as tolerated.  Follow-up in 3 to 4 months.    Festus Barren, MD  01/09/2024 4:58 PM    This note was created with Dragon medical transcription system.  Any errors from dictation are purely unintentional

## 2024-01-19 ENCOUNTER — Telehealth (INDEPENDENT_AMBULATORY_CARE_PROVIDER_SITE_OTHER): Payer: Self-pay

## 2024-01-19 NOTE — Telephone Encounter (Signed)
 Rose Bernard called stating that her vein from her laser is red, swollen, tender to the touch and hard to walk on. She stated she has seen Dr. Vonna Guardian since her surgery but all this just started yesterday.   Please advise

## 2024-01-19 NOTE — Telephone Encounter (Signed)
 I discussed symptoms with patient and advised patient to use warm compresses, ibuprofen  and baby aspirin.

## 2024-01-21 ENCOUNTER — Encounter (INDEPENDENT_AMBULATORY_CARE_PROVIDER_SITE_OTHER): Payer: Self-pay | Admitting: Vascular Surgery

## 2024-01-22 ENCOUNTER — Other Ambulatory Visit (INDEPENDENT_AMBULATORY_CARE_PROVIDER_SITE_OTHER): Payer: Self-pay | Admitting: Nurse Practitioner

## 2024-01-22 MED ORDER — DOXYCYCLINE HYCLATE 100 MG PO TABS: 100.0000 mg | ORAL_TABLET | Freq: Two times a day (BID) | ORAL | 0 refills | Status: DC

## 2024-01-29 ENCOUNTER — Telehealth: Payer: Self-pay | Admitting: Internal Medicine

## 2024-01-29 DIAGNOSIS — E7800 Pure hypercholesterolemia, unspecified: Secondary | ICD-10-CM

## 2024-01-29 NOTE — Telephone Encounter (Signed)
 Patient need lab orders.

## 2024-01-29 NOTE — Telephone Encounter (Signed)
I have placed the order for the future labs.

## 2024-01-30 ENCOUNTER — Ambulatory Visit (INDEPENDENT_AMBULATORY_CARE_PROVIDER_SITE_OTHER): Payer: BC Managed Care – PPO | Admitting: Internal Medicine

## 2024-01-30 ENCOUNTER — Encounter: Payer: Self-pay | Admitting: Internal Medicine

## 2024-01-30 VITALS — BP 120/68 | HR 84 | Temp 98.0°F | Resp 16 | Ht 67.0 in | Wt 166.4 lb

## 2024-01-30 DIAGNOSIS — N390 Urinary tract infection, site not specified: Secondary | ICD-10-CM

## 2024-01-30 DIAGNOSIS — F439 Reaction to severe stress, unspecified: Secondary | ICD-10-CM | POA: Diagnosis not present

## 2024-01-30 DIAGNOSIS — Z0000 Encounter for general adult medical examination without abnormal findings: Secondary | ICD-10-CM

## 2024-01-30 DIAGNOSIS — J3489 Other specified disorders of nose and nasal sinuses: Secondary | ICD-10-CM

## 2024-01-30 DIAGNOSIS — D0512 Intraductal carcinoma in situ of left breast: Secondary | ICD-10-CM

## 2024-01-30 DIAGNOSIS — L989 Disorder of the skin and subcutaneous tissue, unspecified: Secondary | ICD-10-CM

## 2024-01-30 DIAGNOSIS — I890 Lymphedema, not elsewhere classified: Secondary | ICD-10-CM

## 2024-01-30 DIAGNOSIS — E7800 Pure hypercholesterolemia, unspecified: Secondary | ICD-10-CM

## 2024-01-30 DIAGNOSIS — I83811 Varicose veins of right lower extremities with pain: Secondary | ICD-10-CM | POA: Diagnosis not present

## 2024-01-30 MED ORDER — DOXYCYCLINE HYCLATE 100 MG PO TABS: 100.0000 mg | ORAL_TABLET | Freq: Two times a day (BID) | ORAL | 0 refills | Status: DC

## 2024-01-30 NOTE — Assessment & Plan Note (Signed)
Physical today 01/30/24.  Colonoscopy 07/2018.  Scheduled for f/u colonoscopy 03/2024. MRI breasts 11/24/23 - birads I.  PAP 07/18/19 - negative with negative HPV - (s/p hysterectomy)

## 2024-01-30 NOTE — Progress Notes (Signed)
 Subjective:    Patient ID: Rose Bernard, female    DOB: 19-Jun-1966, 58 y.o.   MRN: [id number]  Patient here for  Chief Complaint  Patient presents with   Annual Exam    HPI Here for a physical exam. Has been seeing Dr Wyn Quaker. S/p laser ablation of the right small saphenous vein.  Persistent lymphedema. Recommended compression hose. Has open lesion - left posterior calf. Started on abx. from [facility]. Still some persistent irritation. Discussed extension of abx. 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 S/p cortisone injection. Discussed - tylenol arthritis. Had f/u with Dr Cathie Hoops 10/17/23 - off endocrine therapy (did not tolerate). Recommended continuing annual mammogram screening. Does report some cough and congestion.  Started mucinex DM. No chest pain or sob reported. Persistent nasal lesions.    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 Hysterectomy and bladder repair with subsequent appendectomy. L Ovary remain in place    APPENDECTOMY     2001 due to scar tissue   BLADDER BOTOX  2014, 2015, 2016   BREAST BIOPSY Bilateral 1991   BREAST BIOPSY Left 12/12/2019   [place] bx, ATYPICAL DUCTAL HYPERPLASIA.   ADDITIONAL FINDINGS: FIBROCYSTIC CHANGES, COLUMNAR CELL CHANGE, STROMAL  SCLEROSIS, USUAL DUCTAL HYPERPLASIA.   BREAST LUMPECTOMY     BREAST SURGERY Bilateral 2002   benign mass removed   COLONOSCOPY  2014   EXCISION OF BREAST BIOPSY Left 01/10/2020   Procedure: EXCISION OF BREAST BIOPSY w/ Needle localization;  Surgeon: Carolan Shiver, MD;  Location:  [facility] OR[facility];  Service: General;  Laterality: Left;   FOOT SURGERY Bilateral    INTERSTIM IMPLANT PLACEMENT N/A 06/19/2023   Procedure: Leane Platt IMPLANT FIRST STAGE WITH IMPEDENCE CHECK;  Surgeon: Alfredo Martinez, MD;  Location: [facility] OR[facility];  Service: Urology;  Laterality: N/A;   INTERSTIM IMPLANT PLACEMENT N/A 06/19/2023   Procedure: Leane Platt IMPLANT SECOND STAGE;  Surgeon: Alfredo Martinez, MD;  Location: [facility] OR[facility];  Service: Urology;  Laterality: N/A;   LAPAROSCOPIC HYSTERECTOMY  1995   MASTECTOMY, PARTIAL Left 01/31/2020   Procedure: MASTECTOMY PARTIAL;  Surgeon: Carolan Shiver, MD;  Location: [facility] OR[facility];  Service: General;  Laterality: Left;   reconstruction of bladder   1995   2/2 delivery complications with 3rd child    Family History  Problem Relation Age of Onset   Colon cancer Father        With ostomy in place    Breast cancer Paternal Aunt 58   Breast cancer Maternal Aunt    Breast cancer Maternal Aunt    Social History   Socioeconomic History   Marital status: Married    Spouse name: Not on file   Number of children: 2   Years of education: Not on file   Highest education level: Not on file  Occupational History   Not on file  Tobacco Use   Smoking status: Never    Passive exposure: Never   Smokeless tobacco: Never  Vaping Use   Vaping status: Never Used  Substance  and Sexual Activity   Alcohol use: No    Alcohol/week: 0.0 standard drinks of alcohol   Drug use: No   Sexual activity: Yes    Birth control/protection: Surgical  Other Topics Concern   Not on file  Social History Narrative   Not on file   Social Drivers of Health   Financial Resource Strain: Not on file  Food Insecurity: Not on file  Transportation Needs: Not on file  Physical Activity: Not on file  Stress: Not on file  Social Connections: Not on file     Review of Systems  Constitutional:  Negative for appetite change and unexpected weight change.  HENT:  Positive for congestion.  Negative for sinus pressure and sore throat.   Eyes:  Negative for pain and visual disturbance.  Respiratory:  Negative for cough, chest tightness and shortness of breath.   Cardiovascular:  Negative for chest pain and palpitations.  Gastrointestinal:  Negative for abdominal pain, diarrhea, nausea and vomiting.  Genitourinary:  Negative for difficulty urinating and dysuria.  Musculoskeletal:  Negative for joint swelling and myalgias.  Skin:  Negative for color change and rash.  Neurological:  Negative for dizziness and headaches.  Hematological:  Negative for adenopathy. Does not bruise/bleed easily.  Psychiatric/Behavioral:  Negative for agitation and dysphoric mood.        Objective:     BP 120/68   Pulse 84   Temp 98 F (36.7 C)   Resp 16   Ht 5\' 7"  (1.702 m)   Wt 166 lb 6.4 oz (75.5 kg)   LMP 02/07/1994   SpO2 98%   BMI 26.06 kg/m  Wt Readings from Last 3 Encounters:  01/30/24 166 lb 6.4 oz (75.5 kg)  01/09/24 166 lb (75.3 kg)  10/17/23 159 lb 4.8 oz (72.3 kg)    Physical Exam Vitals reviewed.  Constitutional:      General: She is not in acute distress.    Appearance: Normal appearance. She is well-developed.  HENT:     Head: Normocephalic and atraumatic.     Right Ear: External ear normal.     Left Ear: External ear normal.  Eyes:     General: No scleral icterus.       Right eye: No discharge.        Left eye: No discharge.     Conjunctiva/sclera: Conjunctivae normal.  Neck:     Thyroid: No thyromegaly.  Cardiovascular:     Rate and Rhythm: Normal rate and regular rhythm.  Pulmonary:     Effort: No tachypnea, accessory muscle usage or respiratory distress.     Breath sounds: Normal breath sounds. No decreased breath sounds, wheezing or rhonchi.  Chest:  Breasts:    Right: No inverted nipple, mass, nipple discharge or tenderness (no axillary adenopathy).     Left: No inverted nipple, mass, nipple discharge or tenderness (no axilarry adenopathy).   Abdominal:     General: Bowel sounds are normal.     Palpations: Abdomen is soft.     Tenderness: There is no abdominal tenderness.  Musculoskeletal:        General: No swelling or tenderness.     Cervical back: Neck supple.  Lymphadenopathy:     Cervical: No cervical adenopathy.  Skin:    Findings: No rash.     Comments: Left posterior calf - open lesion with minimal surrounding erythema.   Neurological:     Mental Status: She is alert and oriented to person, place, and time.  Psychiatric:        Mood and Affect: Mood normal.        Behavior: Behavior normal.         Outpatient Encounter Medications as of 01/30/2024  Medication Sig   Azelastine HCl 137 MCG/SPRAY SOLN Place 1 spray into both nostrils 2 (two) times daily.   doxycycline (VIBRA-TABS) 100 MG tablet Take 1 tablet (100 mg total) by mouth 2 (two) times daily.   fluticasone (FLONASE) 50 MCG/ACT nasal spray Place 2 sprays into both nostrils daily.   Vitamin E (VITAMIN E/D-ALPHA NATURAL) 268 MG (400 UNIT) CAPS Take by mouth.   [DISCONTINUED] doxycycline (VIBRA-TABS) 100 MG tablet Take 1 tablet (100 mg total) by mouth 2 (two) times daily.   [DISCONTINUED] estradiol (ESTRACE) 0.1 MG/GM vaginal cream Apply one pea-sized amount around the opening of the urethra daily for 2 weeks, then 3 times weekly moving forward. (Patient not taking: Reported on 10/17/2023)   [DISCONTINUED] HYDROcodone-acetaminophen (NORCO) 5-325 MG tablet Take 1-2 tablets by mouth every 6 (six) hours as needed for moderate pain. (Patient not taking: Reported on 10/17/2023)   No facility-administered encounter medications on file as of 01/30/2024.     Lab Results  Component Value Date   WBC 4.0 10/17/2023   HGB 13.4 10/17/2023   HCT 40.7 10/17/2023   PLT 273 10/17/2023   GLUCOSE 102 (H) 10/17/2023   CHOL 239 (H) 10/06/2023   TRIG 111.0 10/06/2023   HDL 68.80 10/06/2023   LDLCALC 148 (H) 10/06/2023   ALT 17 10/17/2023   AST 15 10/17/2023   NA 138  10/17/2023   K 3.9 10/17/2023   CL 104 10/17/2023   CREATININE 0.73 10/17/2023   BUN 19 10/17/2023   CO2 26 10/17/2023   TSH 1.77 03/13/2023   HGBA1C 5.6 10/01/2015    MR BREAST BILATERAL W WO CONTRAST INC CAD Result Date: 11/24/2023 CLINICAL DATA:  Old female with history of DCIS status post lumpectomy in January of 2021. She had a recent diagnostic mammogram demonstrating an asymmetry in the left breast thought to be benign. EXAM: BILATERAL BREAST MRI WITH AND WITHOUT CONTRAST TECHNIQUE: Multiplanar, multisequence MR images of both breasts were obtained prior to and following the intravenous administration of 6 ml of Gadavist Three-dimensional MR images were rendered by post-processing of the original MR data on an independent workstation. The three-dimensional MR images were interpreted, and findings are reported in the following complete MRI report for this study. Three dimensional images were evaluated at the independent interpreting workstation using the DynaCAD thin client. COMPARISON:  No prior MRI available for comparison. Correlation made with prior mammogram and ultrasound images. FINDINGS: Breast composition: c. Heterogeneous fibroglandular tissue. Background parenchymal enhancement: Minimal Right breast: No mass or abnormal enhancement. Left breast: Expected surgical changes noted in the upper-outer posterior left breast. No abnormal enhancement is seen at the lumpectomy site. No mass or abnormal enhancement throughout the left breast. Lymph nodes: No abnormal appearing lymph nodes. Ancillary findings:  None. IMPRESSION: 1. Expected surgical changes at the left breast lumpectomy site. No evidence of recurrent malignancy. 2.  No evidence of malignancy in the bilateral breasts. RECOMMENDATION: 1. Follow-up left breast mammogram and ultrasound in May of 2025 to follow-up the findings on her diagnostic mammogram and ultrasound from 11/01/2023. BI-RADS CATEGORY  1: Negative. Electronically Signed    By: Frederico Hamman M.D.   On: 11/24/2023 06:19       Assessment & Plan:  Routine general medical examination at a health care facility  Health care maintenance Assessment & Plan: Physical today 01/30/24.  Colonoscopy 07/2018.  Scheduled for f/u colonoscopy 03/2024. MRI breasts 11/24/23 - birads I.  PAP 07/18/19 - negative with negative HPV - (s/p hysterectomy)    Varicose veins of right lower extremity with pain Assessment & Plan:  Has been seeing Dr Wyn Quaker. S/p laser ablation of the right small saphenous vein.  Persistent lymphedema. Recommended compression hose.    Stress Assessment & Plan: Overall appears to be handling things relatively well.  Follow.    Lymphedema Assessment & Plan: Seeing [facility]. Compression hose.    Internal nasal lesion Assessment & Plan: Bactroban. She reports using a steroid cream. Discussed if persistent, will need ENT evaluation.    Hypercholesteremia Assessment & Plan: The 10-year ASCVD risk score (Arnett DK, et al., 2019) is: 2.3%   Values used to calculate the score:     Age: 19 years     Sex: Female     Is Non-Hispanic African American: No     Diabetic: No     Tobacco smoker: No     Systolic Blood Pressure: 120 mmHg     Is BP treated: No     HDL Cholesterol: 68.8 mg/dL     Total Cholesterol: 239 mg/dL  Low cholesterol diet and exercise. Follow lipid panel.    Frequent UTI Assessment & Plan: Being followed by urology. Stable.    Ductal carcinoma in situ (DCIS) of left breast Assessment & Plan: Had f/u with Dr Cathie Hoops 10/17/23 - off endocrine therapy (did not tolerate). Recommended continuing annual mammogram screening.   Skin lesion Assessment & Plan: Persistent skin lesion - posterior calf (left). Started on doxycyline by [facility]. Extend out abx x 1 week. Follow.    Other orders -     Doxycycline Hyclate; Take 1 tablet (100 mg total) by mouth 2 (two) times daily.  Dispense: 14 tablet; Refill: 0     Dale Seabeck, MD

## 2024-01-31 ENCOUNTER — Encounter: Payer: Self-pay | Admitting: Internal Medicine

## 2024-01-31 MED ORDER — MUPIROCIN 2 % EX OINT: 1.0000 | TOPICAL_OINTMENT | Freq: Two times a day (BID) | CUTANEOUS | 0 refills | Status: AC

## 2024-01-31 NOTE — Telephone Encounter (Signed)
Rx sent in for bactroban. My chart message sent to her.

## 2024-01-31 NOTE — Telephone Encounter (Signed)
 noted

## 2024-01-31 NOTE — Telephone Encounter (Signed)
Pt requesting. Medication has been pended

## 2024-02-03 ENCOUNTER — Encounter: Payer: Self-pay | Admitting: Internal Medicine

## 2024-02-03 DIAGNOSIS — L989 Disorder of the skin and subcutaneous tissue, unspecified: Secondary | ICD-10-CM | POA: Insufficient documentation

## 2024-02-03 NOTE — Assessment & Plan Note (Signed)
 Persistent skin lesion - posterior calf (left). Started on doxycyline by [facility]. Extend out abx x 1 week. Follow.

## 2024-02-03 NOTE — Assessment & Plan Note (Signed)
Overall appears to be handling things relatively well.  Follow.   

## 2024-02-03 NOTE — Assessment & Plan Note (Signed)
 Had f/u with Dr Cathie Hoops 10/17/23 - off endocrine therapy (did not tolerate). Recommended continuing annual mammogram screening.

## 2024-02-03 NOTE — Assessment & Plan Note (Signed)
 Has been seeing Dr Wyn Quaker. S/p laser ablation of the right small saphenous vein.  Persistent lymphedema. Recommended compression hose.

## 2024-02-03 NOTE — Assessment & Plan Note (Signed)
 Bactroban. She reports using a steroid cream. Discussed if persistent, will need ENT evaluation.

## 2024-02-03 NOTE — Assessment & Plan Note (Signed)
Being followed by urology.  Stable.  

## 2024-02-03 NOTE — Assessment & Plan Note (Signed)
 Seeing [facility]. Compression hose.

## 2024-02-03 NOTE — Assessment & Plan Note (Signed)
 The 10-year ASCVD risk score (Arnett DK, et al., 2019) is: 2.3%   Values used to calculate the score:     Age: 57 years     Sex: Female     Is Non-Hispanic African American: No     Diabetic: No     Tobacco smoker: No     Systolic Blood Pressure: 120 mmHg     Is BP treated: No     HDL Cholesterol: 68.8 mg/dL     Total Cholesterol: 239 mg/dL  Low cholesterol diet and exercise. Follow lipid panel.

## 2024-02-06 ENCOUNTER — Other Ambulatory Visit (INDEPENDENT_AMBULATORY_CARE_PROVIDER_SITE_OTHER): Payer: BC Managed Care – PPO

## 2024-02-06 DIAGNOSIS — E7800 Pure hypercholesterolemia, unspecified: Secondary | ICD-10-CM

## 2024-02-06 LAB — CBC WITH DIFFERENTIAL/PLATELET
Basophils Absolute: 0.1 10*3/uL (ref 0.0–0.1)
Basophils Relative: 2.3 % (ref 0.0–3.0)
Eosinophils Absolute: 0.1 10*3/uL (ref 0.0–0.7)
Eosinophils Relative: 2.4 % (ref 0.0–5.0)
HCT: 41.9 % (ref 36.0–46.0)
Hemoglobin: 13.6 g/dL (ref 12.0–15.0)
Lymphocytes Relative: 45.2 % (ref 12.0–46.0)
Lymphs Abs: 1.7 10*3/uL (ref 0.7–4.0)
MCHC: 32.5 g/dL (ref 30.0–36.0)
MCV: 93.2 fL (ref 78.0–100.0)
Monocytes Absolute: 0.5 10*3/uL (ref 0.1–1.0)
Monocytes Relative: 13.6 % — ABNORMAL HIGH (ref 3.0–12.0)
Neutro Abs: 1.3 10*3/uL — ABNORMAL LOW (ref 1.4–7.7)
Neutrophils Relative %: 36.5 % — ABNORMAL LOW (ref 43.0–77.0)
Platelets: 309 10*3/uL (ref 150.0–400.0)
RBC: 4.5 Mil/uL (ref 3.87–5.11)
RDW: 13.9 % (ref 11.5–15.5)
WBC: 3.7 10*3/uL — ABNORMAL LOW (ref 4.0–10.5)

## 2024-02-06 LAB — LIPID PANEL
Cholesterol: 213 mg/dL — ABNORMAL HIGH (ref 0–200)
HDL: 59.9 mg/dL (ref >39.00)
LDL Cholesterol: 139 mg/dL — ABNORMAL HIGH (ref 0–99)
NonHDL: 152.85
Total CHOL/HDL Ratio: 4
Triglycerides: 70 mg/dL (ref 0.0–149.0)
VLDL: 14 mg/dL (ref 0.0–40.0)

## 2024-02-06 LAB — BASIC METABOLIC PANEL
BUN: 16 mg/dL (ref 6–23)
CO2: 29 meq/L (ref 19–32)
Calcium: 8.9 mg/dL (ref 8.4–10.5)
Chloride: 104 meq/L (ref 96–112)
Creatinine, Ser: 0.72 mg/dL (ref 0.40–1.20)
GFR: 92.4 mL/min (ref >60.00)
Glucose, Bld: 93 mg/dL (ref 70–99)
Potassium: 4.1 meq/L (ref 3.5–5.1)
Sodium: 140 meq/L (ref 135–145)

## 2024-02-06 LAB — HEPATIC FUNCTION PANEL
ALT: 10 U/L (ref 0–35)
AST: 15 U/L (ref 0–37)
Albumin: 4.2 g/dL (ref 3.5–5.2)
Alkaline Phosphatase: 99 U/L (ref 39–117)
Bilirubin, Direct: 0.1 mg/dL (ref 0.0–0.3)
Total Bilirubin: 0.6 mg/dL (ref 0.2–1.2)
Total Protein: 7.2 g/dL (ref 6.0–8.3)

## 2024-02-08 ENCOUNTER — Ambulatory Visit: Payer: BC Managed Care – PPO | Admitting: Internal Medicine

## 2024-02-09 ENCOUNTER — Ambulatory Visit: Payer: BC Managed Care – PPO | Admitting: Internal Medicine

## 2024-02-26 ENCOUNTER — Ambulatory Visit (INDEPENDENT_AMBULATORY_CARE_PROVIDER_SITE_OTHER): Payer: BC Managed Care – PPO | Admitting: Urology

## 2024-02-26 VITALS — BP 116/71 | HR 76

## 2024-02-26 DIAGNOSIS — N3281 Overactive bladder: Secondary | ICD-10-CM | POA: Diagnosis not present

## 2024-02-26 DIAGNOSIS — N390 Urinary tract infection, site not specified: Secondary | ICD-10-CM

## 2024-02-26 MED ORDER — NITROFURANTOIN MACROCRYSTAL 100 MG PO CAPS: 100.0000 mg | ORAL_CAPSULE | Freq: Every day | ORAL | 3 refills | Status: DC

## 2024-02-26 NOTE — Progress Notes (Signed)
 02/26/2024 3:24 PM   Rose Bernard [birth year] [id number]  Referring provider: Dale Albertson, MD [address] [place],  [place] [address]  No chief complaint on file.   HPI: I reviewed my lengthy note from April 2024.  She may have had some nausea from trimethoprim and was started on daily Macrodantin.  She had InterStim June 19, 2023.  She saw a nurse practitioner a few weeks later and had improved symptoms.  She did not follow-up.  Infection free on Macrodantin.  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 Hysterectomy and bladder repair with subsequent appendectomy. L Ovary remain in place    APPENDECTOMY     2001 due to scar tissue   BLADDER BOTOX  2014, 2015, 2016   BREAST BIOPSY Bilateral 1991   BREAST BIOPSY Left 12/12/2019   [place] bx, ATYPICAL DUCTAL HYPERPLASIA.   ADDITIONAL FINDINGS: FIBROCYSTIC CHANGES, COLUMNAR CELL CHANGE, STROMAL  SCLEROSIS, USUAL DUCTAL HYPERPLASIA.   BREAST LUMPECTOMY     BREAST SURGERY Bilateral 2002   benign mass removed   COLONOSCOPY  2014   EXCISION OF BREAST BIOPSY Left 01/10/2020   Procedure: EXCISION OF BREAST BIOPSY w/ Needle localization;  Surgeon: Carolan Shiver, MD;  Location: [facility];  Service: General;  Laterality: Left;   FOOT SURGERY Bilateral    INTERSTIM IMPLANT PLACEMENT N/A 06/19/2023   Procedure: Leane Platt IMPLANT FIRST STAGE  WITH IMPEDENCE CHECK;  Surgeon: Alfredo Martinez, MD;  Location: [facility];  Service: Urology;  Laterality: N/A;   INTERSTIM IMPLANT PLACEMENT N/A 06/19/2023   Procedure: Leane Platt IMPLANT SECOND STAGE;  Surgeon: Alfredo Martinez, MD;  Location: [facility];  Service: Urology;  Laterality: N/A;   LAPAROSCOPIC HYSTERECTOMY  1995   MASTECTOMY, PARTIAL Left 01/31/2020   Procedure: MASTECTOMY PARTIAL;  Surgeon: Carolan Shiver, MD;  Location: [facility];  Service: General;  Laterality: Left;   reconstruction of bladder   1995   2/2 delivery complications with 3rd child     Home Medications:  Allergies as of 02/26/2024       Reactions   Trimpex [trimethoprim] Nausea Only   Penicillin V Potassium Rash   Did it involve swelling of the face/tongue/throat, SOB, or low BP? No Did it involve sudden or severe rash/hives, skin peeling, or any reaction on the inside of your mouth or nose? No Did you need to seek medical attention at a hospital or doctor's office? No When did it last happen?      25 years ago If all above answers are "NO", may proceed with cephalosporin use.        Medication List        Accurate as of February 26, 2024  3:24 PM. If you have any questions, ask your nurse or doctor.          Azelastine HCl 137 MCG/SPRAY Soln Place 1 spray into both nostrils 2 (two)  times daily.   doxycycline 100 MG tablet Commonly known as: VIBRA-TABS Take 1 tablet (100 mg total) by mouth 2 (two) times daily.   fluticasone 50 MCG/ACT nasal spray Commonly known as: FLONASE Place 2 sprays into both nostrils daily.   mupirocin ointment 2 % Commonly known as: BACTROBAN Apply 1 Application topically 2 (two) times daily.   Vitamin E/D-Alpha Natural 268 MG (400 UNIT) Caps Generic drug: Vitamin E Take by mouth.        Allergies:  Allergies  Allergen Reactions   Trimpex [Trimethoprim] Nausea Only   Penicillin V Potassium Rash    Did it involve swelling of the face/tongue/throat,  SOB, or low BP? No Did it involve sudden or severe rash/hives, skin peeling, or any reaction on the inside of your mouth or nose? No Did you need to seek medical attention at a hospital or doctor's office? No When did it last happen?      25 years ago If all above answers are "NO", may proceed with cephalosporin use.     Family History: Family History  Problem Relation Age of Onset   Colon cancer Father        With ostomy in place    Breast cancer Paternal Aunt 20   Breast cancer Maternal Aunt    Breast cancer Maternal Aunt     Social History:  reports that she has never smoked. She has never been exposed to tobacco smoke. She has never used smokeless tobacco. She reports that she does not drink alcohol and does not use drugs.  ROS:                                        Physical Exam: LMP 02/07/1994   Constitutional:  Alert and oriented, No acute distress.   Laboratory Data: Lab Results  Component Value Date   WBC 3.7 (L) 02/06/2024   HGB 13.6 02/06/2024   HCT 41.9 02/06/2024   MCV 93.2 02/06/2024   PLT 309.0 02/06/2024    Lab Results  Component Value Date   CREATININE 0.72 02/06/2024    No results found for: "PSA"  No results found for: "TESTOSTERONE"  Lab Results  Component Value Date   HGBA1C 5.6 10/01/2015    Urinalysis    Component Value Date/Time   COLORURINE YELLOW 01/26/2023 1555   APPEARANCEUR Hazy (A) 02/28/2023 1539   LABSPEC 1.015 01/26/2023 1555   PHURINE 6.0 01/26/2023 1555   GLUCOSEU Negative 02/28/2023 1539   GLUCOSEU NEGATIVE 01/26/2023 1555   HGBUR NEGATIVE 01/26/2023 1555   BILIRUBINUR Negative 02/28/2023 1539   KETONESUR NEGATIVE 01/26/2023 1555   PROTEINUR Negative 02/28/2023 1539   UROBILINOGEN 0.2 01/26/2023 1555   NITRITE Negative 02/28/2023 1539   NITRITE NEGATIVE 01/26/2023 1555   LEUKOCYTESUR Trace (A) 02/28/2023 1539   LEUKOCYTESUR NEGATIVE 01/26/2023 1555    Pertinent Imaging:   Assessment  & Plan: 90 x 3 Macrodantin sent to pharmacy.  Call if culture positive.  See in 1 year.  Very pleased with progress.  Now works in [employer] at [employer]  1. OAB (overactive bladder) (Primary)   2. Recurrent UTI    No follow-ups on file.  Martina Sinner, MD  [facility] [address] [place], [place] [address] [phone]

## 2024-02-27 LAB — URINALYSIS, COMPLETE
Bilirubin, UA: NEGATIVE
Glucose, UA: NEGATIVE
Ketones, UA: NEGATIVE
Nitrite, UA: POSITIVE — AB
Specific Gravity, UA: 1.025 (ref 1.005–1.030)
Urobilinogen, Ur: 0.2 mg/dL (ref 0.2–1.0)
pH, UA: 6 (ref 5.0–7.5)

## 2024-02-27 LAB — MICROSCOPIC EXAMINATION
Epithelial Cells (non renal): NONE SEEN /HPF (ref 0–10)
WBC, UA: >30 /HPF — AB (ref 0–5)

## 2024-02-29 LAB — CULTURE, URINE COMPREHENSIVE

## 2024-03-01 ENCOUNTER — Other Ambulatory Visit: Payer: Self-pay

## 2024-03-01 DIAGNOSIS — E7800 Pure hypercholesterolemia, unspecified: Secondary | ICD-10-CM

## 2024-03-01 DIAGNOSIS — D72819 Decreased white blood cell count, unspecified: Secondary | ICD-10-CM

## 2024-03-01 MED ORDER — CIPROFLOXACIN HCL 250 MG PO TABS: 250.0000 mg | ORAL_TABLET | Freq: Two times a day (BID) | ORAL | 0 refills | Status: AC

## 2024-03-08 ENCOUNTER — Other Ambulatory Visit (INDEPENDENT_AMBULATORY_CARE_PROVIDER_SITE_OTHER): Payer: BC Managed Care – PPO

## 2024-03-08 DIAGNOSIS — D72819 Decreased white blood cell count, unspecified: Secondary | ICD-10-CM | POA: Diagnosis not present

## 2024-03-08 LAB — CBC WITH DIFFERENTIAL/PLATELET
Basophils Absolute: 0.1 10*3/uL (ref 0.0–0.1)
Basophils Relative: 1.1 % (ref 0.0–3.0)
Eosinophils Absolute: 0.2 10*3/uL (ref 0.0–0.7)
Eosinophils Relative: 3.2 % (ref 0.0–5.0)
HCT: 41.2 % (ref 36.0–46.0)
Hemoglobin: 13.7 g/dL (ref 12.0–15.0)
Lymphocytes Relative: 32.9 % (ref 12.0–46.0)
Lymphs Abs: 1.7 10*3/uL (ref 0.7–4.0)
MCHC: 33.3 g/dL (ref 30.0–36.0)
MCV: 91.8 fl (ref 78.0–100.0)
Monocytes Absolute: 0.5 10*3/uL (ref 0.1–1.0)
Monocytes Relative: 10.1 % (ref 3.0–12.0)
Neutro Abs: 2.7 10*3/uL (ref 1.4–7.7)
Neutrophils Relative %: 52.7 % (ref 43.0–77.0)
Platelets: 314 10*3/uL (ref 150.0–400.0)
RBC: 4.49 Mil/uL (ref 3.87–5.11)
RDW: 13.5 % (ref 11.5–15.5)
WBC: 5.1 10*3/uL (ref 4.0–10.5)

## 2024-03-10 ENCOUNTER — Encounter: Payer: Self-pay | Admitting: Internal Medicine

## 2024-03-18 ENCOUNTER — Encounter: Payer: Self-pay | Admitting: Gastroenterology

## 2024-03-28 ENCOUNTER — Encounter: Payer: Self-pay | Admitting: Gastroenterology

## 2024-03-31 NOTE — H&P (Signed)
 Pre-Procedure H&P   Patient ID: Rose Bernard is a 58 y.o. female.  Gastroenterology Provider: Quintin Buckle, DO  Referring Provider: Dr. Geralyn Knee PCP: Dellar Fenton, MD  Date: 04/01/2024  HPI [name] is a 58 y.o. female who presents today for Colonoscopy for Colorectal cancer screening; family history of colon cancer .  Last underwent colonoscopy in August 2019 which was normal aside from diverticulosis and internal hemorrhoids.  Also normal in 2014. Father with history of colon cancer at age 71  Patient reports daily bowel movement without melena or hematochezia.  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 Hysterectomy and bladder repair with subsequent appendectomy. L Ovary remain in place    APPENDECTOMY     2001 due to scar tissue   BLADDER BOTOX   2014, 2015, 2016   BREAST BIOPSY Bilateral 1991   BREAST BIOPSY Left 12/12/2019   us  bx, ATYPICAL DUCTAL HYPERPLASIA.   ADDITIONAL FINDINGS: FIBROCYSTIC CHANGES, COLUMNAR CELL CHANGE, STROMAL  SCLEROSIS, USUAL DUCTAL HYPERPLASIA.   BREAST LUMPECTOMY     BREAST SURGERY Bilateral 2002   benign mass removed   CESAREAN SECTION N/A    COLONOSCOPY  2014   EXCISION OF BREAST BIOPSY Left 01/10/2020   Procedure: EXCISION OF BREAST BIOPSY w/ Needle localization;  Surgeon: Eldred Grego, MD;  Location: [facility];  Service: General;  Laterality: Left;    FOOT SURGERY Bilateral    INTERSTIM IMPLANT PLACEMENT N/A 06/19/2023   Procedure: Simona [place] IMPLANT FIRST STAGE WITH IMPEDENCE CHECK;  Surgeon: Erman Hayward, MD;  Location: [facility];  Service: Urology;  Laterality: N/A;   INTERSTIM IMPLANT PLACEMENT N/A 06/19/2023   Procedure: Simona [place] IMPLANT SECOND STAGE;  Surgeon: Erman Hayward, MD;  Location: [facility];  Service: Urology;  Laterality: N/A;   LAPAROSCOPIC HYSTERECTOMY  1995   MASTECTOMY, PARTIAL Left 01/31/2020   Procedure: MASTECTOMY PARTIAL;  Surgeon: Eldred Grego, MD;  Location: [facility];  Service: General;  Laterality: Left;   reconstruction of bladder   1995   2/2 delivery complications with 3rd child     Family History Father- crc 89 No other h/o GI disease or malignancy  Review of Systems  Constitutional:  Negative for activity change, appetite change, chills, diaphoresis, fatigue, fever and unexpected weight change.  HENT:  Negative for trouble swallowing and voice change.   Respiratory:  Negative for shortness of breath and wheezing.   Cardiovascular:  Negative for chest pain, palpitations and leg swelling.  Gastrointestinal:  Negative for abdominal distention, abdominal pain, anal bleeding, blood in stool, constipation, diarrhea, nausea, rectal pain and vomiting.  Musculoskeletal:  Negative for arthralgias and myalgias.  Skin:  Negative for color change and pallor.  Neurological:  Negative for dizziness, syncope and weakness.  Psychiatric/Behavioral:  Negative for confusion.   All other systems reviewed and are negative.    Medications No current facility-administered medications on file prior to encounter.  Current Outpatient Medications on File Prior to Encounter  Medication Sig Dispense Refill   Azelastine  HCl 137 MCG/SPRAY SOLN Place 1 spray into both nostrils 2 (two) times daily. 90 mL 0   fluticasone  (FLONASE ) 50 MCG/ACT nasal spray Place 2 sprays into both nostrils daily. 48 g 1     Pertinent medications related to GI and procedure were reviewed by me with the patient prior to the procedure   Current Facility-Administered Medications:    0.9 %  sodium chloride  infusion, , Intravenous, Continuous, Quintin Buckle, DO, Last Rate: 20 mL/hr at 04/01/24 0723, New Bag at 04/01/24 0723  sodium chloride  20 mL/hr at 04/01/24 4098       Allergies  Allergen Reactions   Trimpex  [Trimethoprim ] Nausea Only   Penicillin V Potassium Rash    Did it involve swelling of the face/tongue/throat, SOB, or low BP? No Did it involve sudden or severe rash/hives, skin peeling, or any reaction on the inside of your mouth or nose? No Did you need to seek medical attention at a hospital or doctor's office? No When did it last happen?      25 years ago If all above answers are "NO", may proceed with cephalosporin use.    Allergies were reviewed by me prior to the procedure  Objective   Body mass index is 24.75 kg/m. Vitals:   04/01/24 0659 04/01/24 0713  BP:  (!) 155/75  Pulse:  65  Resp:  16  Temp:  (!) 97.3 F (36.3 C)  TempSrc:  Temporal  SpO2:  100%  Weight: 71.7 kg   Height: 5\' 7"  (1.702 m)      Physical Exam Vitals and nursing note reviewed.  Constitutional:      General: She is not in acute distress.    Appearance: Normal appearance. She is not ill-appearing, toxic-appearing or diaphoretic.  HENT:     Head: Normocephalic and atraumatic.     Nose: Nose normal.     Mouth/Throat:     Mouth: Mucous membranes are moist.     Pharynx: Oropharynx is clear.  Eyes:     General: No scleral icterus.    Extraocular Movements: Extraocular movements intact.  Cardiovascular:     Rate and Rhythm: Normal rate and regular rhythm.     Heart sounds: Normal heart sounds. No murmur heard.    No friction rub. No gallop.  Pulmonary:     Effort: Pulmonary effort is normal. No respiratory distress.     Breath sounds: Normal breath sounds. No wheezing, rhonchi or rales.   Abdominal:     General: Bowel sounds are normal. There is no distension.     Palpations: Abdomen is soft.     Tenderness: There is no abdominal tenderness. There is no guarding or rebound.  Musculoskeletal:     Cervical back: Neck supple.     Right lower leg: No edema.     Left lower leg: No edema.  Skin:    General: Skin is warm and dry.     Coloration: Skin is not jaundiced or pale.  Neurological:     General: No focal deficit present.     Mental Status: She is alert and oriented to person, place, and time. Mental status is at baseline.  Psychiatric:        Mood and Affect: Mood normal.        Behavior: Behavior normal.        Thought Content: Thought content normal.  Judgment: Judgment normal.      Assessment:  [name] is a 58 y.o. female  who presents today for Colonoscopy for Colorectal cancer screening; family history of colon cancer .  Plan:  Colonoscopy with possible intervention today  Colonoscopy with possible biopsy, control of bleeding, polypectomy, and interventions as necessary has been discussed with the patient/patient representative. Informed consent was obtained from the patient/patient representative after explaining the indication, nature, and risks of the procedure including but not limited to death, bleeding, perforation, missed neoplasm/lesions, cardiorespiratory compromise, and reaction to medications. Opportunity for questions was given and appropriate answers were provided. Patient/patient representative has verbalized understanding is amenable to undergoing the procedure.   Quintin Buckle, DO  [facility]  Portions of the record may have been created with voice recognition software. Occasional wrong-word or 'sound-a-like' substitutions may have occurred due to the inherent limitations of voice recognition software.  Read the chart carefully and recognize, using context, where substitutions may have  occurred.

## 2024-04-01 ENCOUNTER — Encounter
Admission: RE | Disposition: A | Discharge status: Home / Self Care | Payer: Self-pay | Source: Home / Self Care | Attending: Gastroenterology

## 2024-04-01 ENCOUNTER — Encounter: Payer: Self-pay | Admitting: Gastroenterology

## 2024-04-01 ENCOUNTER — Ambulatory Visit: Admitting: Anesthesiology

## 2024-04-01 ENCOUNTER — Ambulatory Visit
Admission: RE | Admit: 2024-04-01 | Discharge: 2024-04-01 | Disposition: A | Discharge status: Home / Self Care | Payer: BC Managed Care – PPO | Attending: Gastroenterology | Admitting: Gastroenterology

## 2024-04-01 DIAGNOSIS — Z1211 Encounter for screening for malignant neoplasm of colon: Secondary | ICD-10-CM | POA: Diagnosis present

## 2024-04-01 DIAGNOSIS — K219 Gastro-esophageal reflux disease without esophagitis: Secondary | ICD-10-CM | POA: Diagnosis not present

## 2024-04-01 DIAGNOSIS — D122 Benign neoplasm of ascending colon: Secondary | ICD-10-CM | POA: Diagnosis not present

## 2024-04-01 DIAGNOSIS — D123 Benign neoplasm of transverse colon: Secondary | ICD-10-CM | POA: Diagnosis not present

## 2024-04-01 DIAGNOSIS — K649 Unspecified hemorrhoids: Secondary | ICD-10-CM | POA: Diagnosis not present

## 2024-04-01 DIAGNOSIS — K648 Other hemorrhoids: Secondary | ICD-10-CM | POA: Diagnosis not present

## 2024-04-01 DIAGNOSIS — K5790 Diverticulosis of intestine, part unspecified, without perforation or abscess without bleeding: Secondary | ICD-10-CM | POA: Diagnosis not present

## 2024-04-01 DIAGNOSIS — K5730 Diverticulosis of large intestine without perforation or abscess without bleeding: Secondary | ICD-10-CM | POA: Insufficient documentation

## 2024-04-01 DIAGNOSIS — Z800 Family history of malignant neoplasm of digestive organs: Secondary | ICD-10-CM | POA: Insufficient documentation

## 2024-04-01 DIAGNOSIS — K635 Polyp of colon: Secondary | ICD-10-CM | POA: Diagnosis not present

## 2024-04-01 HISTORY — PX: COLONOSCOPY: SHX5424

## 2024-04-01 SURGERY — COLONOSCOPY
Anesthesia: General

## 2024-04-01 MED ORDER — PROPOFOL 10 MG/ML IV BOLUS
INTRAVENOUS | Status: DC | PRN
  Administered 2024-04-01: 10 mg via INTRAVENOUS
  Administered 2024-04-01: 70 mg via INTRAVENOUS
  Administered 2024-04-01: 20 mg via INTRAVENOUS

## 2024-04-01 MED ORDER — SODIUM CHLORIDE 0.9 % IV SOLN: INTRAVENOUS | Status: DC

## 2024-04-01 MED ORDER — PROPOFOL 500 MG/50ML IV EMUL
INTRAVENOUS | Status: DC | PRN
  Administered 2024-04-01: 165 ug/kg/min via INTRAVENOUS

## 2024-04-01 MED ORDER — LIDOCAINE HCL (CARDIAC) PF 100 MG/5ML IV SOSY
PREFILLED_SYRINGE | INTRAVENOUS | Status: DC | PRN
  Administered 2024-04-01: 100 mg via INTRAVENOUS

## 2024-04-01 MED ORDER — PROPOFOL 1000 MG/100ML IV EMUL
INTRAVENOUS | Status: AC
Start: 2024-04-01 — End: ?
  Filled 2024-04-01: qty 400

## 2024-04-01 NOTE — Interval H&P Note (Signed)
 History and Physical Interval Note: Preprocedure H&P from 04/01/24  was reviewed and there was no interval change after seeing and examining the patient.  Written consent was obtained from the patient after discussion of risks, benefits, and alternatives. Patient has consented to proceed with Colonoscopy with possible intervention   04/01/2024 7:36 AM  Rose Bernard  has presented today for surgery, with the diagnosis of Colon cancer screening (Z12.11) Family hx of colon cancer (Z80.0).  The various methods of treatment have been discussed with the patient and family. After consideration of risks, benefits and other options for treatment, the patient has consented to  Procedure(s): COLONOSCOPY (N/A) as a surgical intervention.  The patient's history has been reviewed, patient examined, no change in status, stable for surgery.  I have reviewed the patient's chart and labs.  Questions were answered to the patient's satisfaction.     Rose Bernard

## 2024-04-01 NOTE — Anesthesia Procedure Notes (Signed)
 Procedure Name: General with mask airway Date/Time: 04/01/2024 7:41 AM  Performed by: Niki Barter, CRNAPre-anesthesia Checklist: Patient identified, Emergency Drugs available, Suction available and Patient being monitored Patient Re-evaluated:Patient Re-evaluated prior to induction Oxygen Delivery Method: Simple face mask Induction Type: IV induction Placement Confirmation: positive ETCO2 and breath sounds checked- equal and bilateral Dental Injury: Teeth and Oropharynx as per pre-operative assessment

## 2024-04-01 NOTE — H&P (Signed)
 Date of service is 04/01/24

## 2024-04-01 NOTE — Transfer of Care (Signed)
 Immediate Anesthesia Transfer of Care Note  Patient: Rose Bernard  Procedure(s) Performed: COLONOSCOPY  Patient Location: Endoscopy Unit  Anesthesia Type:General  Level of Consciousness: drowsy and patient cooperative  Airway & Oxygen Therapy: Patient Spontanous Breathing and Patient connected to face mask oxygen  Post-op Assessment: Report given to RN and Post -op Vital signs reviewed and stable  Post vital signs: Reviewed and stable  Last Vitals:  Vitals Value Taken Time  BP 109/65 04/01/24 0815  Temp 36.2 C 04/01/24 0815  Pulse 67 04/01/24 0816  Resp 11 04/01/24 0816  SpO2 98 % 04/01/24 0816  Vitals shown include unfiled device data.  Last Pain:  Vitals:   04/01/24 0815  TempSrc: Tympanic  PainSc: Asleep         Complications: No notable events documented.

## 2024-04-01 NOTE — Anesthesia Postprocedure Evaluation (Signed)
 Anesthesia Post Note  Patient: Rose Bernard  Procedure(s) Performed: COLONOSCOPY  Patient location during evaluation: PACU Anesthesia Type: General Level of consciousness: awake and alert Pain management: pain level controlled Vital Signs Assessment: post-procedure vital signs reviewed and stable Respiratory status: spontaneous breathing, nonlabored ventilation and respiratory function stable Cardiovascular status: blood pressure returned to baseline and stable Postop Assessment: no apparent nausea or vomiting Anesthetic complications: no   No notable events documented.   Last Vitals:  Vitals:   04/01/24 0825 04/01/24 0835  BP: 127/73 117/85  Pulse: 71 70  Resp: 16 16  Temp:    SpO2: 98% 100%    Last Pain:  Vitals:   04/01/24 0835  TempSrc:   PainSc: 0-No pain                 Baltazar Bonier

## 2024-04-01 NOTE — Anesthesia Preprocedure Evaluation (Addendum)
 Anesthesia Evaluation  Patient identified by MRN, date of birth, ID band Patient awake    Reviewed: Allergy & Precautions, H&P , NPO status , Patient's Chart, lab work & pertinent test results  Airway Mallampati: III  TM Distance: >3 FB Neck ROM: full    Dental no notable dental hx.    Pulmonary neg pulmonary ROS   Pulmonary exam normal        Cardiovascular negative cardio ROS Normal cardiovascular exam     Neuro/Psych negative neurological ROS  negative psych ROS   GI/Hepatic Neg liver ROS,GERD  Controlled,,  Endo/Other  negative endocrine ROS    Renal/GU negative Renal ROS  negative genitourinary   Musculoskeletal   Abdominal Normal abdominal exam  (+)   Peds  Hematology negative hematology ROS (+)   Anesthesia Other Findings Past Medical History: 2021: Cancer (HCC)     Comment:  Breast cancer No date: Chronic cystitis No date: Frequent UTI No date: GERD (gastroesophageal reflux disease) No date: History of kidney stones No date: Incomplete bladder emptying No date: Personal history of radiation therapy 11/11/2020: Status post breast lumpectomy No date: 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 Hysterectomy               and bladder repair with subsequent appendectomy. L Ovary               remain in place  No date: APPENDECTOMY     Comment:  2001 due to scar tissue 2014, 2015, 2016: BLADDER BOTOX  1991: BREAST BIOPSY; Bilateral 12/12/2019: BREAST BIOPSY; Left     Comment:  us  bx, ATYPICAL DUCTAL HYPERPLASIA.   ADDITIONAL               FINDINGS: FIBROCYSTIC CHANGES, COLUMNAR CELL CHANGE,               STROMAL  SCLEROSIS, USUAL DUCTAL HYPERPLASIA. No date: BREAST LUMPECTOMY 2002: BREAST SURGERY; Bilateral     Comment:  benign mass removed No date: CESAREAN SECTION; N/A 2014:  COLONOSCOPY 01/10/2020: EXCISION OF BREAST BIOPSY; Left     Comment:  Procedure: EXCISION OF BREAST BIOPSY w/ Needle               localization;  Surgeon: Eldred Grego, MD;                Location: [facility];  Service: General;  Laterality: Left; No date: FOOT SURGERY; Bilateral 06/19/2023: INTERSTIM IMPLANT PLACEMENT; N/A     Comment:  Procedure: INTERSTIM IMPLANT FIRST STAGE WITH IMPEDENCE               CHECK;  Surgeon: Erman Hayward, MD;  Location: [facility]               [facility];  Service: Urology;  Laterality: N/A; 06/19/2023: INTERSTIM IMPLANT PLACEMENT; N/A     Comment:  Procedure: INTERSTIM IMPLANT SECOND STAGE;  Surgeon:               Erman Hayward, MD;  Location: [facility];  Service:               Urology;  Laterality: N/A; 1995: LAPAROSCOPIC HYSTERECTOMY 01/31/2020: MASTECTOMY, PARTIAL; Left     Comment:  Procedure: MASTECTOMY PARTIAL;  Surgeon: Eldred Grego, MD;  Location: [facility];  Service: General;  Laterality: Left; 1995: reconstruction of bladder      Comment:  2/2 delivery complications with 3rd child      Reproductive/Obstetrics negative OB ROS                              Anesthesia Physical Anesthesia Plan  ASA: 2  Anesthesia Plan: General   Post-op Pain Management:    Induction:   PONV Risk Score and Plan: Propofol  infusion and TIVA  Airway Management Planned: Natural Airway  Additional Equipment:   Intra-op Plan:   Post-operative Plan:   Informed Consent: I have reviewed the patients History and Physical, chart, labs and discussed the procedure including the risks, benefits and alternatives for the proposed anesthesia with the patient or authorized representative who has indicated his/her understanding and acceptance.     Dental Advisory Given  Plan Discussed with: CRNA and Surgeon  Anesthesia Plan Comments:          Anesthesia Quick Evaluation

## 2024-04-01 NOTE — Op Note (Signed)
 [facility] Patient Name: Rose Bernard Procedure Date: 04/01/2024 7:26 AM MRN: [id number] Account #: [id number] Date of Birth: 1966/06/17 Admit Type: Outpatient Age: 58 Room: [facility] ENDO ROOM 2 Gender: Female Note Status: Finalized Instrument Name: Peds Colonoscope [id number] Procedure:             Colonoscopy Indications:           Screening in patient at increased risk: Family history                         of 1st-degree relative with colorectal cancer before                         age 69 years Providers:             Quintin Buckle DO, DO Medicines:             Monitored Anesthesia Care Complications:         No immediate complications. Estimated blood loss:                         Minimal. Procedure:             Pre-Anesthesia Assessment:                        - Prior to the procedure, a History and Physical was                         performed, and patient medications and allergies were                         reviewed. The patient is competent. The risks and                         benefits of the procedure and the sedation options and                         risks were discussed with the patient. All questions                         were answered and informed consent was obtained.                         Patient identification and proposed procedure were                         verified by the physician, the nurse, the anesthetist                         and the technician in the endoscopy suite. Mental                         Status Examination: alert and oriented. Airway                         Examination: normal oropharyngeal airway and neck                         mobility. Respiratory Examination: clear to  auscultation. CV Examination: RRR, no murmurs, no S3                         or S4. Prophylactic Antibiotics: The patient does not                         require prophylactic antibiotics. Prior                          Anticoagulants: The patient has taken no anticoagulant                         or antiplatelet agents. ASA Grade Assessment: II - A                         patient with mild systemic disease. After reviewing                         the risks and benefits, the patient was deemed in                         satisfactory condition to undergo the procedure. The                         anesthesia plan was to use monitored anesthesia care                         (MAC). Immediately prior to administration of                         medications, the patient was re-assessed for adequacy                         to receive sedatives. The heart rate, respiratory                         rate, oxygen saturations, blood pressure, adequacy of                         pulmonary ventilation, and response to care were                         monitored throughout the procedure. The physical                         status of the patient was re-assessed after the                         procedure.                        After obtaining informed consent, the colonoscope was                         passed under direct vision. Throughout the procedure,                         the patient's blood pressure, pulse, and oxygen  saturations were monitored continuously. The                         Colonoscope was introduced through the anus and                         advanced to the the terminal ileum, with                         identification of the appendiceal orifice and IC                         valve. The colonoscopy was performed without                         difficulty. The patient tolerated the procedure well.                         The quality of the bowel preparation was evaluated                         using the BBPS [facility] Bowel Preparation Scale) with                         scores of: Right Colon = 3, Transverse Colon = 3 and                         Left Colon  = 3 (entire mucosa seen well with no                         residual staining, small fragments of stool or opaque                         liquid). The total BBPS score equals 9. The ileocecal                         valve, appendiceal orifice, and rectum were                         photographed. Findings:      The perianal and digital rectal examinations were normal. Pertinent       negatives include normal sphincter tone.      A 1 to 2 mm polyp was found in the ascending colon. The polyp was       sessile. The polyp was removed with a jumbo cold forceps. Resection and       retrieval were complete. Estimated blood loss was minimal.      Two sessile polyps were found in the transverse colon and ascending       colon. The polyps were 3 to 4 mm in size. These polyps were removed with       a cold snare. Resection and retrieval were complete. Estimated blood       loss was minimal.      An 8 to 10 mm polyp was found in the transverse colon. The polyp was       sessile. The polyp was removed with a cold snare. Resection and       retrieval were complete. Estimated  blood loss was minimal.      Multiple small-mouthed diverticula were found in the left colon.      Non-bleeding internal hemorrhoids were found during retroflexion. The       hemorrhoids were Grade I (internal hemorrhoids that do not prolapse).       Estimated blood loss: none.      The exam was otherwise without abnormality on direct and retroflexion       views. Impression:            - One 1 to 2 mm polyp in the ascending colon, removed                         with a jumbo cold forceps. Resected and retrieved.                        - Two 3 to 4 mm polyps in the transverse colon and in                         the ascending colon, removed with a cold snare.                         Resected and retrieved.                        - One 8 to 10 mm polyp in the transverse colon,                         removed with a cold snare.  Resected and retrieved.                        - Diverticulosis in the left colon.                        - Non-bleeding internal hemorrhoids.                        - The examination was otherwise normal on direct and                         retroflexion views. Recommendation:        - Patient has a contact number available for                         emergencies. The signs and symptoms of potential                         delayed complications were discussed with the patient.                         Return to normal activities tomorrow. Written                         discharge instructions were provided to the patient.                        - Discharge patient to home.                        -  Resume previous diet.                        - Continue present medications.                        - No ibuprofen , naproxen , or other non-steroidal                         anti-inflammatory drugs for 5 days after polyp removal.                        - Await pathology results.                        - Repeat colonoscopy for surveillance based on                         pathology results.                        - Return to referring physician as previously                         scheduled.                        - The findings and recommendations were discussed with                         the patient. Procedure Code(s):     --- Professional ---                        [phone], Colonoscopy, flexible; with removal of                         tumor(s), polyp(s), or other lesion(s) by snare                         technique                        45380, 59, Colonoscopy, flexible; with biopsy, single                         or multiple Diagnosis Code(s):     --- Professional ---                        Z80.0, Family history of malignant neoplasm of                         digestive organs                        D12.2, Benign neoplasm of ascending colon                        D12.3, Benign neoplasm  of transverse colon (hepatic                         flexure or splenic flexure)  K64.0, First degree hemorrhoids                        K57.30, Diverticulosis of large intestine without                         perforation or abscess without bleeding CPT copyright 2022 American Medical Association. All rights reserved. The codes documented in this report are preliminary and upon coder review may  be revised to meet current compliance requirements. Attending Participation:      I personally performed the entire procedure. Polo Brisk, DO Quintin Buckle DO, DO 04/01/2024 8:23:44 AM This report has been signed electronically. Number of Addenda: 0 Note Initiated On: 04/01/2024 7:26 AM Scope Withdrawal Time: 0 hours 21 minutes 17 seconds  Total Procedure Duration: 0 hours 28 minutes 33 seconds  Estimated Blood Loss:  Estimated blood loss was minimal.      [facility]

## 2024-04-02 LAB — SURGICAL PATHOLOGY

## 2024-04-07 ENCOUNTER — Encounter: Payer: Self-pay | Admitting: Urology

## 2024-04-16 ENCOUNTER — Encounter (INDEPENDENT_AMBULATORY_CARE_PROVIDER_SITE_OTHER): Payer: Self-pay | Admitting: Vascular Surgery

## 2024-04-16 ENCOUNTER — Ambulatory Visit (INDEPENDENT_AMBULATORY_CARE_PROVIDER_SITE_OTHER): Payer: BC Managed Care – PPO | Admitting: Vascular Surgery

## 2024-04-16 VITALS — BP 118/72 | HR 70 | Resp 18 | Ht 67.0 in | Wt 161.4 lb

## 2024-04-16 DIAGNOSIS — I890 Lymphedema, not elsewhere classified: Secondary | ICD-10-CM | POA: Diagnosis not present

## 2024-04-16 DIAGNOSIS — I872 Venous insufficiency (chronic) (peripheral): Secondary | ICD-10-CM | POA: Diagnosis not present

## 2024-04-22 ENCOUNTER — Ambulatory Visit
Admission: RE | Admit: 2024-04-22 | Discharge: 2024-04-22 | Disposition: A | Discharge status: Home / Self Care | Payer: BC Managed Care – PPO | Source: Ambulatory Visit | Attending: Internal Medicine | Admitting: Internal Medicine

## 2024-04-22 DIAGNOSIS — R92322 Mammographic fibroglandular density, left breast: Secondary | ICD-10-CM | POA: Diagnosis not present

## 2024-04-22 DIAGNOSIS — R928 Other abnormal and inconclusive findings on diagnostic imaging of breast: Secondary | ICD-10-CM | POA: Diagnosis present

## 2024-04-22 DIAGNOSIS — D0512 Intraductal carcinoma in situ of left breast: Secondary | ICD-10-CM | POA: Diagnosis not present

## 2024-04-23 ENCOUNTER — Ambulatory Visit: Payer: Self-pay

## 2024-04-23 ENCOUNTER — Other Ambulatory Visit: Payer: Self-pay | Admitting: Internal Medicine

## 2024-04-23 DIAGNOSIS — R928 Other abnormal and inconclusive findings on diagnostic imaging of breast: Secondary | ICD-10-CM

## 2024-04-23 DIAGNOSIS — Z1231 Encounter for screening mammogram for malignant neoplasm of breast: Secondary | ICD-10-CM

## 2024-04-23 DIAGNOSIS — M1811 Unilateral primary osteoarthritis of first carpometacarpal joint, right hand: Secondary | ICD-10-CM | POA: Diagnosis not present

## 2024-04-23 NOTE — Progress Notes (Signed)
 Order placed for bilateral diagnostic mammogram and ultrasound.

## 2024-04-23 NOTE — Progress Notes (Signed)
 Subjective:    Patient ID: Rose Bernard, female    DOB: November 27, 1966, 58 y.o.   MRN: [id number] Chief Complaint  Patient presents with   Follow-up    fu 3-4 months     Patient returns today in follow up of venous insufficiency as well as lymphedema.  About a month ago, she underwent successful laser ablation of the right small saphenous vein.  Postprocedural duplex showed a successful ablation without DVT.  This has resulted in mild improvement in her right leg swelling and discomfort but she still has persistent swelling.  This is not entirely surprising as she has known lymphedema after multiple previous foot and ankle surgeries. 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    Review of Systems  Constitutional: Negative.   Musculoskeletal:        Patient has a history of arthritis, DVT and lymphedema.  All other systems reviewed and are negative.      Objective:    Physical Exam Constitutional:      Appearance: Normal appearance. She is normal weight.  HENT:     Head: Normocephalic.  Eyes:     Pupils: Pupils are equal, round, and reactive to light.  Cardiovascular:     Rate and Rhythm: Normal rate and regular rhythm.     Pulses: Normal pulses.     Heart sounds: Normal heart sounds.  Pulmonary:     Effort: Pulmonary effort is normal.     Breath sounds: Normal breath sounds.  Abdominal:     General: Abdomen is flat. Bowel sounds are normal.     Palpations: Abdomen is soft.  Musculoskeletal:     Cervical back: Normal range of motion.     Right lower leg: Edema present.     Left lower leg: Edema present.  Skin:    General: Skin is warm and dry.     Capillary Refill: Capillary refill takes 2 to 3 seconds.  Neurological:     General: No focal deficit present.     Mental Status: She is alert and oriented to person, place, and time. Mental status is at baseline.  Psychiatric:        Mood  and Affect: Mood normal.        Behavior: Behavior normal.        Thought Content: Thought content normal.        Judgment: Judgment normal.     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     Social History   Socioeconomic History   Marital status: Married    Spouse name: Not on file   Number of children: 2   Years of education: Not on file   Highest education level: Not on file  Occupational History   Not on file  Tobacco Use   Smoking status: Never  Passive exposure: Never   Smokeless tobacco: Never  Vaping Use   Vaping status: Never Used  Substance and Sexual Activity   Alcohol use: Never   Drug use: No   Sexual activity: Yes    Birth control/protection: Surgical  Other Topics Concern   Not on file  Social History Narrative   Not on file   Social Drivers of Health   Financial Resource Strain: Not on file  Food Insecurity: Not on file  Transportation Needs: Not on file  Physical Activity: Not on file  Stress: Not on file  Social Connections: Not on file  Intimate Partner Violence: Not on file    Past Surgical History:  Procedure Laterality Date   ABDOMINAL HYSTERECTOMY  1995   2/2 delivery complications with 3rd child. Hysterectomy and bladder repair with subsequent appendectomy. L Ovary remain in place    APPENDECTOMY     2001 due to scar tissue   BLADDER BOTOX   2014, 2015, 2016   BREAST BIOPSY Bilateral 1991   BREAST BIOPSY Left 12/12/2019   us  bx, ATYPICAL DUCTAL HYPERPLASIA.   ADDITIONAL FINDINGS: FIBROCYSTIC CHANGES,  COLUMNAR CELL CHANGE, STROMAL  SCLEROSIS, USUAL DUCTAL HYPERPLASIA.   BREAST LUMPECTOMY     BREAST SURGERY Bilateral 2002   benign mass removed   CESAREAN SECTION N/A    COLONOSCOPY  2014   COLONOSCOPY N/A 04/01/2024   Procedure: COLONOSCOPY;  Surgeon: Quintin Buckle, DO;  Location: [facility] ENDOSCOPY;  Service: Gastroenterology;  Laterality: N/A;   EXCISION OF BREAST BIOPSY Left 01/10/2020   Procedure: EXCISION OF BREAST BIOPSY w/ Needle localization;  Surgeon: Eldred Grego, MD;  Location: [facility] ORS;  Service: General;  Laterality: Left;   FOOT SURGERY Bilateral    INTERSTIM IMPLANT PLACEMENT N/A 06/19/2023   Procedure: Simona Dublin IMPLANT FIRST STAGE WITH IMPEDENCE CHECK;  Surgeon: Erman Hayward, MD;  Location: [facility] ORS;  Service: Urology;  Laterality: N/A;   INTERSTIM IMPLANT PLACEMENT N/A 06/19/2023   Procedure: Simona Dublin IMPLANT SECOND STAGE;  Surgeon: Erman Hayward, MD;  Location: [facility] ORS;  Service: Urology;  Laterality: N/A;   LAPAROSCOPIC HYSTERECTOMY  1995   MASTECTOMY, PARTIAL Left 01/31/2020   Procedure: MASTECTOMY PARTIAL;  Surgeon: Eldred Grego, MD;  Location: [facility] ORS;  Service: General;  Laterality: Left;   reconstruction of bladder   1995   2/2 delivery complications with 3rd child     Family History  Problem Relation Age of Onset   Colon cancer Father        With ostomy in place    Breast cancer Paternal Aunt 58   Breast cancer Maternal Aunt    Breast cancer Maternal Aunt     Allergies  Allergen Reactions   Trimpex  [Trimethoprim ] Nausea Only   Penicillin V Potassium Rash    Did it involve swelling of the face/tongue/throat, SOB, or low BP? No Did it involve sudden or severe rash/hives, skin peeling, or any reaction on the inside of your mouth or nose? No Did you need to seek medical attention at a hospital or doctor's office? No When did it last happen?      25 years ago If all above answers are "NO", may proceed with cephalosporin  use.        Latest Ref Rng & Units 03/08/2024    8:07 AM 02/06/2024    8:06 AM 10/17/2023   10:23 AM  CBC  WBC 4.0 - 10.5 K/uL 5.1  3.7  4.0   Hemoglobin 12.0 - 15.0 g/dL 57.8  13.6  13.4   Hematocrit 36.0 - 46.0 % 41.2  41.9  40.7   Platelets 150.0 - 400.0 K/uL 314.0  309.0  273        CMP     Component Value Date/Time   NA 140 02/06/2024 0806   K 4.1 02/06/2024 0806   CL 104 02/06/2024 0806   CO2 29 02/06/2024 0806   GLUCOSE 93 02/06/2024 0806   BUN 16 02/06/2024 0806   BUN 15 09/02/2015 0000   CREATININE 0.72 02/06/2024 0806   CREATININE 0.73 10/17/2023 1022   CALCIUM 8.9 02/06/2024 0806   PROT 7.2 02/06/2024 0806   ALBUMIN 4.2 02/06/2024 0806   AST 15 02/06/2024 0806   AST 15 10/17/2023 1022   ALT 10 02/06/2024 0806   ALT 17 10/17/2023 1022   ALKPHOS 99 02/06/2024 0806   BILITOT 0.6 02/06/2024 0806   BILITOT 0.8 10/17/2023 1022   GFR 92.40 02/06/2024 0806   GFRNONAA >60 10/17/2023 1022     No results found.     Assessment & Plan:   1. Chronic venous insufficiency (Primary) Patient underwent successful laser ablation of the right small saphenous vein. She has had mild improvement in her symptoms. She still has chronic swelling after previous ankle surgeries and definitely has a significant lymphedema component.  However she does complain of some calf cramping intermittently with an increase swelling to her right ankle not related to any known activity.  Therefore I recommend right lower extremity ultrasounds to check for DVT. 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 Plan to follow-up in 3 to 4 months with completed ultrasounds.  2. Lymphedema Patient has longstanding lymphedema which is moderate to severe and at least stage II. Continue compression socks, elevation, and activity as tolerated. Follow-up in 3 to 4 months.    Current Outpatient Medications on File Prior to Visit   Medication Sig Dispense Refill   Azelastine  HCl 137 MCG/SPRAY SOLN Place 1 spray into both nostrils 2 (two) times daily. 90 mL 0   doxycycline  (VIBRA -TABS) 100 MG tablet Take 1 tablet (100 mg total) by mouth 2 (two) times daily. 14 tablet 0   fluticasone  (FLONASE ) 50 MCG/ACT nasal spray Place 2 sprays into both nostrils daily. 48 g 1   mupirocin  ointment (BACTROBAN ) 2 % Apply 1 Application topically 2 (two) times daily. 22 g 0   nitrofurantoin  (MACRODANTIN ) 100 MG capsule Take 1 capsule (100 mg total) by mouth daily. 90 capsule 3   Vitamin E (VITAMIN E/D-ALPHA NATURAL) 268 MG (400 UNIT) CAPS Take by mouth.     No current facility-administered medications on file prior to visit.    There are no Patient Instructions on file for this visit. No follow-ups on file.   Annamaria Barrette, NP

## 2024-04-30 ENCOUNTER — Encounter (INDEPENDENT_AMBULATORY_CARE_PROVIDER_SITE_OTHER): Payer: Self-pay

## 2024-05-10 ENCOUNTER — Telehealth: Payer: Self-pay

## 2024-05-10 NOTE — Telephone Encounter (Signed)
 I spoke with patient and rescheduled her 32-month follow-up with Dr. Dellar Fenton and her associated lab visit, as Dr. Geralyn Knee will be out of the office on 05/30/2024.  We had to reschedule these appointments for August, and the lab visit is after patient's appointment with Dr. Geralyn Knee, this was according to patient's scheduling needs.  Patient states she will be working with a feeding program until school starts back.

## 2024-05-10 NOTE — Telephone Encounter (Signed)
 FYI

## 2024-05-10 NOTE — Telephone Encounter (Signed)
 I can see her 06/05/24 at 11:00 if she can come then.

## 2024-05-13 NOTE — Telephone Encounter (Signed)
 Please call to schedule

## 2024-05-21 ENCOUNTER — Encounter: Payer: Self-pay | Admitting: Internal Medicine

## 2024-05-21 ENCOUNTER — Ambulatory Visit (INDEPENDENT_AMBULATORY_CARE_PROVIDER_SITE_OTHER): Admitting: Internal Medicine

## 2024-05-21 DIAGNOSIS — I890 Lymphedema, not elsewhere classified: Secondary | ICD-10-CM

## 2024-05-21 DIAGNOSIS — E7800 Pure hypercholesterolemia, unspecified: Secondary | ICD-10-CM

## 2024-05-21 NOTE — Progress Notes (Signed)
 Patient ID: Rose Bernard, female   DOB: [birth year], 58 y.o.   MRN: [id number] Did not show for appt

## 2024-05-21 NOTE — Progress Notes (Deleted)
 Subjective:    Patient ID: Rose Bernard, female    DOB: [birth year], 58 y.o.   MRN: [id number]  Patient here for No chief complaint on file.   HPI Here for a scheduled follow up.  Has been seeing Dr Vonna Guardian. S/p laser ablation of the right small saphenous vein.  Persistent lymphedema. Recommended compression hose. Just reevaluated 04/2024 - recommended lower extremity US  givne persistent swelling.  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 S/p cortisone injection. Discussed - tylenol  arthritis. Had f/u with Dr Wilhelmenia Harada 10/17/23 - off endocrine therapy (did not tolerate). Recommended continuing annual mammogram screening. Saw urology 02/26/24- continue macrodatin (prophylaxis). Colonoscopy - one 1-20mm polyp in the ascending colon, two 3-43mm polyps in the transverse colon and ascending colon and one 8-23mm polyp in the transverse colon, diverticulosis and internal hemorrhoids. Saw ortho 04/23/24 - right hand pain. Diagnosed with right thumb CMC OA. Recommended thumb spica and celebrex.   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 Hysterectomy and bladder repair with subsequent appendectomy. L Ovary remain in place    APPENDECTOMY     2001 due to scar tissue   BLADDER BOTOX   2014, 2015, 2016   BREAST BIOPSY Bilateral 1991   BREAST BIOPSY Left 12/12/2019   us  bx, ATYPICAL DUCTAL HYPERPLASIA.   ADDITIONAL FINDINGS: FIBROCYSTIC CHANGES, COLUMNAR CELL CHANGE, STROMAL  SCLEROSIS, USUAL DUCTAL HYPERPLASIA.   BREAST LUMPECTOMY     BREAST SURGERY Bilateral 2002   benign mass  removed   CESAREAN SECTION N/A    COLONOSCOPY  2014   COLONOSCOPY N/A 04/01/2024   Procedure: COLONOSCOPY;  Surgeon: Quintin Buckle, DO;  Location: [facility] ENDOSCOPY;  Service: Gastroenterology;  Laterality: N/A;   EXCISION OF BREAST BIOPSY Left 01/10/2020   Procedure: EXCISION OF BREAST BIOPSY w/ Needle localization;  Surgeon: Eldred Grego, MD;  Location: [facility] ORS;  Service: General;  Laterality: Left;   FOOT SURGERY Bilateral    INTERSTIM IMPLANT PLACEMENT N/A 06/19/2023   Procedure: Simona Dublin IMPLANT FIRST STAGE WITH IMPEDENCE CHECK;  Surgeon: Erman Hayward, MD;  Location: [facility] ORS;  Service: Urology;  Laterality: N/A;   INTERSTIM IMPLANT PLACEMENT N/A 06/19/2023   Procedure: Simona Dublin IMPLANT SECOND STAGE;  Surgeon: Erman Hayward, MD;  Location: [facility] ORS;  Service: Urology;  Laterality: N/A;   LAPAROSCOPIC HYSTERECTOMY  1995   MASTECTOMY, PARTIAL Left 01/31/2020   Procedure: MASTECTOMY PARTIAL;  Surgeon: Eldred Grego, MD;  Location: [facility] ORS;  Service: General;  Laterality: Left;   reconstruction of bladder   1995   2/2 delivery complications with 3rd child    Family History  Problem Relation Age of Onset   Colon cancer Father        With ostomy in place    Breast cancer Paternal Aunt 58   Breast cancer Maternal Aunt    Breast cancer Maternal Aunt    Social History   Socioeconomic History   Marital status: Married    Spouse name: Not on file   Number of children: 2  Years of education: Not on file   Highest education level: Not on file  Occupational History   Not on file  Tobacco Use   Smoking status: Never    Passive exposure: Never   Smokeless tobacco: Never  Vaping Use   Vaping status: Never Used  Substance and Sexual Activity   Alcohol use: Never   Drug use: No   Sexual activity: Yes    Birth control/protection: Surgical  Other Topics Concern   Not on file  Social History Narrative   Not on file   Social Drivers of Health    Financial Resource Strain: Not on file  Food Insecurity: Not on file  Transportation Needs: Not on file  Physical Activity: Not on file  Stress: Not on file  Social Connections: Not on file     Review of Systems     Objective:     LMP 02/07/1994  Wt Readings from Last 3 Encounters:  04/16/24 161 lb 6.4 oz (73.2 kg)  04/01/24 158 lb (71.7 kg)  01/30/24 166 lb 6.4 oz (75.5 kg)    Physical Exam  {Perform Simple Foot Exam  Perform Detailed exam:1} {Insert foot Exam (Optional):30965}   Outpatient Encounter Medications as of 05/21/2024  Medication Sig   Azelastine  HCl 137 MCG/SPRAY SOLN Place 1 spray into both nostrils 2 (two) times daily.   doxycycline  (VIBRA -TABS) 100 MG tablet Take 1 tablet (100 mg total) by mouth 2 (two) times daily.   fluticasone  (FLONASE ) 50 MCG/ACT nasal spray Place 2 sprays into both nostrils daily.   mupirocin  ointment (BACTROBAN ) 2 % Apply 1 Application topically 2 (two) times daily.   nitrofurantoin  (MACRODANTIN ) 100 MG capsule Take 1 capsule (100 mg total) by mouth daily.   Vitamin E (VITAMIN E/D-ALPHA NATURAL) 268 MG (400 UNIT) CAPS Take by mouth.   No facility-administered encounter medications on file as of 05/21/2024.     Lab Results  Component Value Date   WBC 5.1 03/08/2024   HGB 13.7 03/08/2024   HCT 41.2 03/08/2024   PLT 314.0 03/08/2024   GLUCOSE 93 02/06/2024   CHOL 213 (H) 02/06/2024   TRIG 70.0 02/06/2024   HDL 59.90 02/06/2024   LDLCALC 139 (H) 02/06/2024   ALT 10 02/06/2024   AST 15 02/06/2024   NA 140 02/06/2024   K 4.1 02/06/2024   CL 104 02/06/2024   CREATININE 0.72 02/06/2024   BUN 16 02/06/2024   CO2 29 02/06/2024   TSH 1.77 03/13/2023   HGBA1C 5.6 10/01/2015    MM 3D DIAGNOSTIC MAMMOGRAM UNILATERAL LEFT BREAST Result Date: 04/22/2024 CLINICAL DATA:  Patient is status post LEFT lumpectomy and radiation for DCIS in 2021. Patient is under BI-RADS 3 follow-up observation of a LEFT breast asymmetry described on  prior diagnostic exam from November 2024. Patient underwent a benign MRI in December 2024. EXAM: DIGITAL DIAGNOSTIC UNILATERAL LEFT MAMMOGRAM WITH TOMOSYNTHESIS AND CAD TECHNIQUE: Left digital diagnostic mammography and breast tomosynthesis was performed. The images were evaluated with computer-aided detection. COMPARISON:  Previous exam(s). ACR Breast Density Category b: There are scattered areas of fibroglandular density. FINDINGS: There is density and architectural distortion within the LEFT breast, consistent with postsurgical changes. These are stable in comparison to prior. There is mammographic stability of a subtle asymmetry in the LEFT outer breast seen on CC view only. No new suspicious findings are noted. IMPRESSION: 1. Stable mammographic appearance of a LEFT breast asymmetry seen on CC view only. This is favored to be due to technique. Recommend additional  follow-up diagnostic mammogram in 6 months. RECOMMENDATION: Recommend bilateral diagnostic mammogram (with RIGHT and LEFT breast ultrasound if deemed necessary) in 6 months. Patient is due for contralateral screening at this point in time. I have discussed the findings and recommendations with the patient. If applicable, a reminder letter will be sent to the patient regarding the next appointment. BI-RADS CATEGORY  3: Probably benign. Electronically Signed   By: Clancy Crimes M.D.   On: 04/22/2024 15:30       Assessment & Plan:  There are no diagnoses linked to this encounter.   Dellar Fenton, MD

## 2024-05-22 ENCOUNTER — Other Ambulatory Visit (INDEPENDENT_AMBULATORY_CARE_PROVIDER_SITE_OTHER)

## 2024-05-22 DIAGNOSIS — E7800 Pure hypercholesterolemia, unspecified: Secondary | ICD-10-CM

## 2024-05-23 LAB — CBC WITH DIFFERENTIAL/PLATELET
Absolute Lymphocytes: 1583 {cells}/uL (ref 850–3900)
Absolute Monocytes: 424 {cells}/uL (ref 200–950)
Basophils Absolute: 59 {cells}/uL (ref 0–200)
Basophils Relative: 1.4 %
Eosinophils Absolute: 80 {cells}/uL (ref 15–500)
Eosinophils Relative: 1.9 %
HCT: 43.5 % (ref 35.0–45.0)
Hemoglobin: 13.9 g/dL (ref 11.7–15.5)
MCH: 30.4 pg (ref 27.0–33.0)
MCHC: 32 g/dL (ref 32.0–36.0)
MCV: 95.2 fL (ref 80.0–100.0)
MPV: 9.4 fL (ref 7.5–12.5)
Monocytes Relative: 10.1 %
Neutro Abs: 2054 {cells}/uL (ref 1500–7800)
Neutrophils Relative %: 48.9 %
Platelets: 284 10*3/uL (ref 140–400)
RBC: 4.57 10*6/uL (ref 3.80–5.10)
RDW: 12.5 % (ref 11.0–15.0)
Total Lymphocyte: 37.7 %
WBC: 4.2 10*3/uL (ref 3.8–10.8)

## 2024-05-23 LAB — BASIC METABOLIC PANEL WITH GFR
BUN: 21 mg/dL (ref 7–25)
CO2: 27 mmol/L (ref 20–32)
Calcium: 8.8 mg/dL (ref 8.6–10.4)
Chloride: 106 mmol/L (ref 98–110)
Creat: 0.64 mg/dL (ref 0.50–1.03)
Glucose, Bld: 90 mg/dL (ref 65–99)
Potassium: 4.3 mmol/L (ref 3.5–5.3)
Sodium: 142 mmol/L (ref 135–146)
eGFR: 102 mL/min/{1.73_m2} (ref >=60)

## 2024-05-23 LAB — HEPATIC FUNCTION PANEL
AG Ratio: 1.9 (calc) (ref 1.0–2.5)
ALT: 9 U/L (ref 6–29)
AST: 11 U/L (ref 10–35)
Albumin: 4.2 g/dL (ref 3.6–5.1)
Alkaline phosphatase (APISO): 90 U/L (ref 37–153)
Bilirubin, Direct: 0.1 mg/dL (ref 0.0–0.2)
Globulin: 2.2 g/dL (ref 1.9–3.7)
Indirect Bilirubin: 0.4 mg/dL (ref 0.2–1.2)
Total Bilirubin: 0.5 mg/dL (ref 0.2–1.2)
Total Protein: 6.4 g/dL (ref 6.1–8.1)

## 2024-05-23 LAB — LIPID PANEL
Cholesterol: 224 mg/dL — ABNORMAL HIGH (ref <200)
HDL: 75 mg/dL (ref >=50)
LDL Cholesterol (Calc): 130 mg/dL — ABNORMAL HIGH
Non-HDL Cholesterol (Calc): 149 mg/dL — ABNORMAL HIGH (ref <130)
Total CHOL/HDL Ratio: 3 (calc) (ref <5.0)
Triglycerides: 86 mg/dL (ref <150)

## 2024-05-23 LAB — TSH: TSH: 2.04 m[IU]/L (ref 0.40–4.50)

## 2024-05-24 ENCOUNTER — Other Ambulatory Visit: Payer: BC Managed Care – PPO

## 2024-05-24 ENCOUNTER — Ambulatory Visit: Payer: Self-pay | Admitting: Internal Medicine

## 2024-05-28 ENCOUNTER — Encounter (INDEPENDENT_AMBULATORY_CARE_PROVIDER_SITE_OTHER)

## 2024-05-28 ENCOUNTER — Ambulatory Visit (INDEPENDENT_AMBULATORY_CARE_PROVIDER_SITE_OTHER): Admitting: Vascular Surgery

## 2024-05-30 ENCOUNTER — Ambulatory Visit: Payer: BC Managed Care – PPO | Admitting: Internal Medicine

## 2024-06-04 ENCOUNTER — Ambulatory Visit (INDEPENDENT_AMBULATORY_CARE_PROVIDER_SITE_OTHER): Admitting: Internal Medicine

## 2024-06-04 VITALS — BP 118/68 | HR 72 | Temp 98.0°F | Resp 16 | Ht 67.0 in | Wt 161.0 lb

## 2024-06-04 DIAGNOSIS — F439 Reaction to severe stress, unspecified: Secondary | ICD-10-CM

## 2024-06-04 DIAGNOSIS — M25579 Pain in unspecified ankle and joints of unspecified foot: Secondary | ICD-10-CM

## 2024-06-04 DIAGNOSIS — I872 Venous insufficiency (chronic) (peripheral): Secondary | ICD-10-CM

## 2024-06-04 DIAGNOSIS — E7800 Pure hypercholesterolemia, unspecified: Secondary | ICD-10-CM

## 2024-06-04 DIAGNOSIS — Z800 Family history of malignant neoplasm of digestive organs: Secondary | ICD-10-CM

## 2024-06-04 DIAGNOSIS — D0512 Intraductal carcinoma in situ of left breast: Secondary | ICD-10-CM

## 2024-06-04 DIAGNOSIS — D649 Anemia, unspecified: Secondary | ICD-10-CM

## 2024-06-04 NOTE — Assessment & Plan Note (Signed)
 The 10-year ASCVD risk score (Arnett DK, et al., 2019) is: 2%   Values used to calculate the score:     Age: 58 years     Clincally relevant sex: Female     Is Non-Hispanic African American: No     Diabetic: No     Tobacco smoker: No     Systolic Blood Pressure: 118 mmHg     Is BP treated: No     HDL Cholesterol: 75 mg/dL     Total Cholesterol: 224 mg/dL  Low cholesterol diet and exercise. Follow lipid panel.

## 2024-06-04 NOTE — Progress Notes (Signed)
 Subjective:    Patient ID: Rose Bernard, female    DOB: 03/17/1966, 58 y.o.   MRN: [id number]  Patient here for  Chief Complaint  Patient presents with   Medical Management of Chronic Issues    HPI Here for a scheduled follow up. Has been seeing Dr Marea. S/p laser ablation of the right small saphenous vein. Persistent lymphedema. Recommended compression hose. Just reevaluated 04/2024 - recommended lower extremity US  given persistent swelling. She is wearing compression hose. Is having some pain in her ankle. Request referral to podiatry for further evaluation. Saw podiatry previously for hammertoe. She request referral to [facility]. 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 S/p cortisone injection. Saw ortho 04/23/24 - right hand pain. Diagnosed with right thumb CMC OA. Recommended thumb spica and celebrex. Had f/u with Dr Babara 10/17/23 - off endocrine therapy (did not tolerate). Recommended continuing annual mammogram screening. Saw urology 02/26/24- continue macrodatin (prophylaxis). This appears to be stable. Colonoscopy - one 1-36mm polyp in the ascending colon, two 3-8mm polyps in the transverse colon and ascending colon and one 8-63mm polyp in the transverse colon, diverticulosis and internal hemorrhoids. Breathing stable. No increased cough or congestion reported.    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 Hysterectomy and bladder repair with subsequent appendectomy. L Ovary remain in place    APPENDECTOMY     2001 due to scar tissue   BLADDER  BOTOX   2014, 2015, 2016   BREAST BIOPSY Bilateral 1991   BREAST BIOPSY Left 12/12/2019   us  bx, ATYPICAL DUCTAL HYPERPLASIA.   ADDITIONAL FINDINGS: FIBROCYSTIC CHANGES, COLUMNAR CELL CHANGE, STROMAL  SCLEROSIS, USUAL DUCTAL HYPERPLASIA.   BREAST LUMPECTOMY     BREAST SURGERY Bilateral 2002   benign mass removed   CESAREAN SECTION N/A    COLONOSCOPY  2014   COLONOSCOPY N/A 04/01/2024   Procedure: COLONOSCOPY;  Surgeon: Onita Elspeth Sharper, DO;  Location: [facility] ENDOSCOPY;  Service: Gastroenterology;  Laterality: N/A;   EXCISION OF BREAST BIOPSY Left 01/10/2020   Procedure: EXCISION OF BREAST BIOPSY w/ Needle localization;  Surgeon: Rodolph Romano, MD;  Location: [facility] ORS;  Service: General;  Laterality: Left;   FOOT SURGERY Bilateral    INTERSTIM IMPLANT PLACEMENT N/A 06/19/2023   Procedure: RENNA IMPLANT FIRST STAGE WITH IMPEDENCE CHECK;  Surgeon: Gaston Hamilton, MD;  Location: [facility] ORS;  Service: Urology;  Laterality: N/A;   INTERSTIM IMPLANT PLACEMENT N/A 06/19/2023   Procedure: RENNA IMPLANT SECOND STAGE;  Surgeon: Gaston Hamilton, MD;  Location: [facility] ORS;  Service: Urology;  Laterality: N/A;   LAPAROSCOPIC HYSTERECTOMY  1995   MASTECTOMY, PARTIAL Left 01/31/2020   Procedure: MASTECTOMY PARTIAL;  Surgeon: Rodolph Romano, MD;  Location: [facility] ORS;  Service: General;  Laterality: Left;   reconstruction of bladder   1995   2/2 delivery complications with 3rd child    Family History  Problem Relation Age of Onset   Colon cancer Father        With ostomy in place  Breast cancer Paternal Aunt 76   Breast cancer Maternal Aunt    Breast cancer Maternal Aunt    Social History   Socioeconomic History   Marital status: Married    Spouse name: Not on file   Number of children: 2   Years of education: Not on file   Highest education level: Not on file  Occupational History   Not on file  Tobacco Use   Smoking status: Never    Passive exposure: Never    Smokeless tobacco: Never  Vaping Use   Vaping status: Never Used  Substance and Sexual Activity   Alcohol use: Never   Drug use: No   Sexual activity: Yes    Birth control/protection: Surgical  Other Topics Concern   Not on file  Social History Narrative   Not on file   Social Drivers of Health   Financial Resource Strain: Not on file  Food Insecurity: Not on file  Transportation Needs: Not on file  Physical Activity: Not on file  Stress: Not on file  Social Connections: Not on file     Review of Systems  Constitutional:  Negative for appetite change and unexpected weight change.  HENT:  Negative for congestion and sinus pressure.   Respiratory:  Negative for cough, chest tightness and shortness of breath.   Cardiovascular:  Negative for chest pain and palpitations.       Wearing compression hose and has seen [facility] for leg swelling.   Gastrointestinal:  Negative for abdominal pain, diarrhea, nausea and vomiting.  Genitourinary:  Negative for difficulty urinating and dysuria.  Musculoskeletal:  Negative for myalgias.       Some ankle pain as outlined.   Skin:  Negative for color change and rash.  Neurological:  Negative for dizziness and headaches.  Psychiatric/Behavioral:  Negative for agitation and dysphoric mood.        Objective:     BP 118/68   Pulse 72   Temp 98 F (36.7 C)   Resp 16   Ht 5' 7 (1.702 m)   Wt 161 lb (73 kg)   LMP 02/07/1994   SpO2 98%   BMI 25.22 kg/m  Wt Readings from Last 3 Encounters:  06/04/24 161 lb (73 kg)  04/16/24 161 lb 6.4 oz (73.2 kg)  04/01/24 158 lb (71.7 kg)    Physical Exam Vitals reviewed.  Constitutional:      General: She is not in acute distress.    Appearance: Normal appearance.  HENT:     Head: Normocephalic and atraumatic.     Right Ear: External ear normal.     Left Ear: External ear normal.     Mouth/Throat:     Pharynx: No oropharyngeal exudate or posterior oropharyngeal erythema.   Eyes:      General: No scleral icterus.       Right eye: No discharge.        Left eye: No discharge.     Conjunctiva/sclera: Conjunctivae normal.   Neck:     Thyroid : No thyromegaly.   Cardiovascular:     Rate and Rhythm: Normal rate and regular rhythm.  Pulmonary:     Effort: No respiratory distress.     Breath sounds: Normal breath sounds. No wheezing.  Abdominal:     General: Bowel sounds are normal.     Palpations: Abdomen is soft.     Tenderness: There is no abdominal tenderness.   Musculoskeletal:        General: No tenderness.  Cervical back: Neck supple. No tenderness.     Comments: No increased swelling - stable.   Lymphadenopathy:     Cervical: No cervical adenopathy.   Skin:    Findings: No erythema or rash.   Neurological:     Mental Status: She is alert.   Psychiatric:        Mood and Affect: Mood normal.        Behavior: Behavior normal.         Outpatient Encounter Medications as of 06/04/2024  Medication Sig   Azelastine  HCl 137 MCG/SPRAY SOLN Place 1 spray into both nostrils 2 (two) times daily.   fluticasone  (FLONASE ) 50 MCG/ACT nasal spray Place 2 sprays into both nostrils daily.   mupirocin  ointment (BACTROBAN ) 2 % Apply 1 Application topically 2 (two) times daily.   nitrofurantoin  (MACRODANTIN ) 100 MG capsule Take 1 capsule (100 mg total) by mouth daily.   Vitamin E (VITAMIN E/D-ALPHA NATURAL) 268 MG (400 UNIT) CAPS Take by mouth.   [DISCONTINUED] doxycycline  (VIBRA -TABS) 100 MG tablet Take 1 tablet (100 mg total) by mouth 2 (two) times daily.   No facility-administered encounter medications on file as of 06/04/2024.     Lab Results  Component Value Date   WBC 4.2 05/22/2024   HGB 13.9 05/22/2024   HCT 43.5 05/22/2024   PLT 284 05/22/2024   GLUCOSE 90 05/22/2024   CHOL 224 (H) 05/22/2024   TRIG 86 05/22/2024   HDL 75 05/22/2024   LDLCALC 130 (H) 05/22/2024   ALT 9 05/22/2024   AST 11 05/22/2024   NA 142 05/22/2024   K 4.3 05/22/2024   CL  106 05/22/2024   CREATININE 0.64 05/22/2024   BUN 21 05/22/2024   CO2 27 05/22/2024   TSH 2.04 05/22/2024   HGBA1C 5.6 10/01/2015    MM 3D DIAGNOSTIC MAMMOGRAM UNILATERAL LEFT BREAST Result Date: 04/22/2024 CLINICAL DATA:  Patient is status post LEFT lumpectomy and radiation for DCIS in 2021. Patient is under BI-RADS 3 follow-up observation of a LEFT breast asymmetry described on prior diagnostic exam from November 2024. Patient underwent a benign MRI in December 2024. EXAM: DIGITAL DIAGNOSTIC UNILATERAL LEFT MAMMOGRAM WITH TOMOSYNTHESIS AND CAD TECHNIQUE: Left digital diagnostic mammography and breast tomosynthesis was performed. The images were evaluated with computer-aided detection. COMPARISON:  Previous exam(s). ACR Breast Density Category b: There are scattered areas of fibroglandular density. FINDINGS: There is density and architectural distortion within the LEFT breast, consistent with postsurgical changes. These are stable in comparison to prior. There is mammographic stability of a subtle asymmetry in the LEFT outer breast seen on CC view only. No new suspicious findings are noted. IMPRESSION: 1. Stable mammographic appearance of a LEFT breast asymmetry seen on CC view only. This is favored to be due to technique. Recommend additional follow-up diagnostic mammogram in 6 months. RECOMMENDATION: Recommend bilateral diagnostic mammogram (with RIGHT and LEFT breast ultrasound if deemed necessary) in 6 months. Patient is due for contralateral screening at this point in time. I have discussed the findings and recommendations with the patient. If applicable, a reminder letter will be sent to the patient regarding the next appointment. BI-RADS CATEGORY  3: Probably benign. Electronically Signed   By: Corean Salter M.D.   On: 04/22/2024 15:30       Assessment & Plan:  Hypercholesteremia Assessment & Plan: The 10-year ASCVD risk score (Arnett DK, et al., 2019) is: 2%   Values used to calculate  the score:     Age: 76 years  Clincally relevant sex: Female     Is Non-Hispanic African American: No     Diabetic: No     Tobacco smoker: No     Systolic Blood Pressure: 118 mmHg     Is BP treated: No     HDL Cholesterol: 75 mg/dL     Total Cholesterol: 224 mg/dL  Low cholesterol diet and exercise. Follow lipid panel.   Orders: -     Lipid panel; Future -     Basic metabolic panel with GFR; Future -     Hepatic function panel; Future  Anemia, unspecified type Assessment & Plan: Hgb checked 05/22/24 - wnl.    Ankle pain, unspecified chronicity, unspecified laterality Assessment & Plan: To review previous w/up and evaluation for her ankle - [facility] (Dr Rollene 02/2022) - plan MRI ankle.  Saw Dr Camelia (03/24/22)- steroid injection right ankle. S/p right ankle arthroscopy.  Reevaluated 07/2022 - plan PT and lower extremity ultrasound to confirm no DVT. F/u 08/2022 - synovitis/tenosynovitis - ankle/foot - PT. Has recently seen [facility] for leg swelling. Compression hose. Having some persistent issues with her ankle. Request referral to [facility].   Orders: -     Ambulatory referral to Podiatry  Chronic venous insufficiency Assessment & Plan: Has been seeing Dr Marea. S/p laser ablation of the right small saphenous vein. Persistent lymphedema. Recommended compression hose. Just reevaluated 04/2024 - recommended lower extremity US  given persistent swelling. She is wearing compression hose.   Ductal carcinoma in situ (DCIS) of left breast Assessment & Plan: Had f/u with Dr Babara 10/17/23 - off endocrine therapy (did not tolerate). Recommended continuing annual mammogram screening. Mammogram 10/2023 - recommended 6 month left breast mammogram - Birads III.  Recommended 6 month f/u diagnostic bilateral mammogram - scheduled for 10/2024.    Family history of colon cancer Assessment & Plan: Colonoscopy 04/01/24.    Stress Assessment & Plan: Overall appears to be handling things  relatively well. Follow.       Allena Hamilton, MD

## 2024-06-09 ENCOUNTER — Encounter: Payer: Self-pay | Admitting: Internal Medicine

## 2024-06-09 NOTE — Assessment & Plan Note (Signed)
 To review previous w/up and evaluation for her ankle - [facility] (Dr Rollene 02/2022) - plan MRI ankle.  Saw Dr Camelia (03/24/22)- steroid injection right ankle. S/p right ankle arthroscopy.  Reevaluated 07/2022 - plan PT and lower extremity ultrasound to confirm no DVT. F/u 08/2022 - synovitis/tenosynovitis - ankle/foot - PT. Has recently seen [facility] for leg swelling. Compression hose. Having some persistent issues with her ankle. Request referral to [facility].

## 2024-06-09 NOTE — Assessment & Plan Note (Signed)
 Had f/u with Dr Babara 10/17/23 - off endocrine therapy (did not tolerate). Recommended continuing annual mammogram screening. Mammogram 10/2023 - recommended 6 month left breast mammogram - Birads III.  Recommended 6 month f/u diagnostic bilateral mammogram - scheduled for 10/2024.

## 2024-06-09 NOTE — Assessment & Plan Note (Signed)
 Hgb checked 05/22/24 - wnl.

## 2024-06-09 NOTE — Assessment & Plan Note (Signed)
Overall appears to be handling things relatively well.  Follow.   

## 2024-06-09 NOTE — Assessment & Plan Note (Signed)
 Has been seeing Dr Marea. S/p laser ablation of the right small saphenous vein. Persistent lymphedema. Recommended compression hose. Just reevaluated 04/2024 - recommended lower extremity US  given persistent swelling. She is wearing compression hose.

## 2024-06-09 NOTE — Assessment & Plan Note (Signed)
 Colonoscopy 04/01/24.

## 2024-07-15 ENCOUNTER — Ambulatory Visit: Admitting: Internal Medicine

## 2024-07-29 ENCOUNTER — Other Ambulatory Visit

## 2024-08-26 ENCOUNTER — Ambulatory Visit: Payer: Self-pay

## 2024-08-26 NOTE — Telephone Encounter (Signed)
 Symptoms: burning, frequency, pressure, feeling full x 1 week  She callled her urologist and is not able to be seen until October. Can we work her in?

## 2024-08-26 NOTE — Telephone Encounter (Signed)
 Ok to work in with me as we discussed - tomorrow.

## 2024-08-26 NOTE — Telephone Encounter (Signed)
 FYI Only or Action Required?: FYI only for provider.  Patient was last seen in primary care on 06/04/2024 by Glendia Shad, MD.  Called Nurse Triage reporting Urinary Frequency.  Symptoms began a week ago.  Interventions attempted: Nothing.  Symptoms are: unchanged.  Triage Disposition: See Physician Within 24 Hours  Patient/caregiver understands and will follow disposition?: Yes Reason for Disposition  Urinating more frequently than usual (i.e., frequency) OR new-onset of the feeling of an urgent need to urinate (i.e., urgency)  Answer Assessment - Initial Assessment Questions No available appointments in PCP office, patient not wanting to go to different office. Advised UC  1. SYMPTOM: What's the main symptom you're concerned about? (e.g., frequency, incontinence)     Urinary frequency, feels full and not much coming out when going to the bathroom  2. ONSET: When did the  Frequent urination start?     A week  3. PAIN: Is there any pain? If Yes, ask: How bad is it? (Scale: 1-10; mild, moderate, severe)     Not when urinating, but feels pressure in groin area, pain on the back on both sides  4. CAUSE: What do you think is causing the symptoms?     UTI  5. OTHER SYMPTOMS: Do you have any other symptoms? (e.g., blood in urine, fever, flank pain, pain with urination)     Cold chills for a day or so, wakes up in the night sweating  Protocols used: Urinary Symptoms-A-AH  Copied from CRM #[id number]. Topic: Clinical - Red Word Triage >> Aug 26, 2024  3:02 PM Aisha D wrote: Red Word that prompted transfer to Nurse Triage: pain   Pt stated that she thinks she has a UTI and would like to schedule an appt with the provider. Pt stated that she is experiencing pain in her back, groin area, and shooting pain in her legs.

## 2024-08-27 ENCOUNTER — Ambulatory Visit: Admitting: Physician Assistant

## 2024-08-27 ENCOUNTER — Encounter: Payer: Self-pay | Admitting: Internal Medicine

## 2024-08-27 ENCOUNTER — Ambulatory Visit (INDEPENDENT_AMBULATORY_CARE_PROVIDER_SITE_OTHER): Admitting: Internal Medicine

## 2024-08-27 VITALS — BP 126/70 | HR 65 | Resp 16 | Ht 67.0 in | Wt 158.0 lb

## 2024-08-27 DIAGNOSIS — R102 Pelvic and perineal pain: Secondary | ICD-10-CM | POA: Diagnosis not present

## 2024-08-27 DIAGNOSIS — R300 Dysuria: Secondary | ICD-10-CM | POA: Diagnosis not present

## 2024-08-27 LAB — POC URINALSYSI DIPSTICK (AUTOMATED)
Bilirubin, UA: NEGATIVE
Blood, UA: NEGATIVE
Glucose, UA: NEGATIVE
Ketones, UA: NEGATIVE
Leukocytes, UA: NEGATIVE
Nitrite, UA: NEGATIVE
Protein, UA: NEGATIVE
Spec Grav, UA: 1.025 (ref 1.010–1.025)
Urobilinogen, UA: 0.2 U/dL
pH, UA: 6.5 (ref 5.0–8.0)

## 2024-08-27 NOTE — Telephone Encounter (Signed)
 Appointment has been scheduled.

## 2024-08-27 NOTE — Progress Notes (Unsigned)
 Subjective:    Patient ID: Rose Bernard, female    DOB: Mar 09, 1966, 58 y.o.   MRN: [id number]  Patient here for  Chief Complaint  Patient presents with   Urinary Tract Infection    HPI Work in appt. Work in with concerns regarding a possible UTI. She sees urology regularly. She has a history of OAB and recurring UTIs. She is on daily suppressive macrodantin . Has done well on this regimen. States that starting approximately one week ago, she needs some increased back discomfort and pain in her lower abdomen/pelvic area. States she felt full. No actual dysuria and not blood. Felt like she had some chills. No vomiting. Some discomfort/fullness - urination. States feels similar to previous symptoms she has had with urinary tract infections.    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 Hysterectomy and bladder repair with subsequent appendectomy. L Ovary remain in place    APPENDECTOMY     2001 due to scar tissue   BLADDER BOTOX   2014, 2015, 2016   BREAST BIOPSY Bilateral 1991   BREAST BIOPSY Left 12/12/2019   us  bx, ATYPICAL DUCTAL HYPERPLASIA.   ADDITIONAL FINDINGS: FIBROCYSTIC CHANGES, COLUMNAR CELL CHANGE, STROMAL  SCLEROSIS, USUAL DUCTAL HYPERPLASIA.   BREAST LUMPECTOMY     BREAST SURGERY Bilateral 2002   benign mass removed   CESAREAN SECTION N/A    COLONOSCOPY  2014   COLONOSCOPY N/A 04/01/2024   Procedure: COLONOSCOPY;  Surgeon: Onita Elspeth Sharper, DO;  Location: [facility] ENDOSCOPY;  Service: Gastroenterology;  Laterality: N/A;   EXCISION OF  BREAST BIOPSY Left 01/10/2020   Procedure: EXCISION OF BREAST BIOPSY w/ Needle localization;  Surgeon: Rodolph Romano, MD;  Location: [facility] ORS;  Service: General;  Laterality: Left;   FOOT SURGERY Bilateral    INTERSTIM IMPLANT PLACEMENT N/A 06/19/2023   Procedure: RENNA IMPLANT FIRST STAGE WITH IMPEDENCE CHECK;  Surgeon: Gaston Hamilton, MD;  Location: [facility] ORS;  Service: Urology;  Laterality: N/A;   INTERSTIM IMPLANT PLACEMENT N/A 06/19/2023   Procedure: RENNA IMPLANT SECOND STAGE;  Surgeon: Gaston Hamilton, MD;  Location: [facility] ORS;  Service: Urology;  Laterality: N/A;   LAPAROSCOPIC HYSTERECTOMY  1995   MASTECTOMY, PARTIAL Left 01/31/2020   Procedure: MASTECTOMY PARTIAL;  Surgeon: Rodolph Romano, MD;  Location: [facility] ORS;  Service: General;  Laterality: Left;   reconstruction of bladder   1995   2/2 delivery complications with 3rd child    Family History  Problem Relation Age of Onset   Colon cancer Father        With ostomy in place    Breast cancer Paternal Aunt 58   Breast cancer Maternal Aunt    Breast cancer Maternal Aunt    Social History   Socioeconomic History   Marital status: Married    Spouse name: Not on file   Number of children: 2   Years of education: Not on file   Highest education level: Not on file  Occupational History   Not on file  Tobacco Use  Smoking status: Never    Passive exposure: Never   Smokeless tobacco: Never  Vaping Use   Vaping status: Never Used  Substance and Sexual Activity   Alcohol use: Never   Drug use: No   Sexual activity: Yes    Birth control/protection: Surgical  Other Topics Concern   Not on file  Social History Narrative   Not on file   Social Drivers of Health   Financial Resource Strain: Not on file  Food Insecurity: Not on file  Transportation Needs: Not on file  Physical Activity: Not on file  Stress: Not on file  Social Connections: Not on file     Review of Systems   Constitutional:  Negative for appetite change and fever.  HENT:  Negative for congestion and sinus pressure.   Respiratory:  Negative for cough, chest tightness and shortness of breath.   Cardiovascular:  Negative for chest pain, palpitations and leg swelling.  Gastrointestinal:  Negative for nausea and vomiting.       Lower abdominal fullness as outlined.   Genitourinary:  Negative for difficulty urinating.       Some lower abdominal fullness/discomfort - with urination and she relates to symptoms similare to previous UTI symptoms.   Musculoskeletal:  Negative for joint swelling and myalgias.  Skin:  Negative for color change and rash.  Neurological:  Negative for dizziness and headaches.  Psychiatric/Behavioral:  Negative for agitation and dysphoric mood.        Objective:     BP 126/70   Pulse 65   Resp 16   Ht 5' 7 (1.702 m)   Wt 158 lb (71.7 kg)   LMP 02/07/1994   SpO2 99%   BMI 24.75 kg/m  Wt Readings from Last 3 Encounters:  08/27/24 158 lb (71.7 kg)  06/04/24 161 lb (73 kg)  04/16/24 161 lb 6.4 oz (73.2 kg)    Physical Exam Vitals reviewed.  Constitutional:      General: She is not in acute distress.    Appearance: Normal appearance.  HENT:     Head: Normocephalic and atraumatic.     Right Ear: External ear normal.     Left Ear: External ear normal.  Eyes:     General: No scleral icterus.       Right eye: No discharge.        Left eye: No discharge.     Conjunctiva/sclera: Conjunctivae normal.  Neck:     Thyroid : No thyromegaly.  Cardiovascular:     Rate and Rhythm: Normal rate and regular rhythm.  Pulmonary:     Effort: No respiratory distress.     Breath sounds: Normal breath sounds. No wheezing.  Abdominal:     General: Bowel sounds are normal.     Palpations: Abdomen is soft.     Comments: Minimal suprapubic discomfort to palpation.   Musculoskeletal:        General: No swelling or tenderness.     Cervical back: Neck supple. No tenderness.   Lymphadenopathy:     Cervical: No cervical adenopathy.  Skin:    Findings: No erythema or rash.  Neurological:     Mental Status: She is alert.  Psychiatric:        Mood and Affect: Mood normal.        Behavior: Behavior normal.         Outpatient Encounter Medications as of 08/27/2024  Medication Sig   Azelastine  HCl 137 MCG/SPRAY SOLN Place 1 spray into both nostrils  2 (two) times daily.   fluticasone  (FLONASE ) 50 MCG/ACT nasal spray Place 2 sprays into both nostrils daily.   mupirocin  ointment (BACTROBAN ) 2 % Apply 1 Application topically 2 (two) times daily.   nitrofurantoin  (MACRODANTIN ) 100 MG capsule Take 1 capsule (100 mg total) by mouth daily.   Vitamin E (VITAMIN E/D-ALPHA NATURAL) 268 MG (400 UNIT) CAPS Take by mouth.   No facility-administered encounter medications on file as of 08/27/2024.     Lab Results  Component Value Date   WBC 4.2 05/22/2024   HGB 13.9 05/22/2024   HCT 43.5 05/22/2024   PLT 284 05/22/2024   GLUCOSE 90 05/22/2024   CHOL 224 (H) 05/22/2024   TRIG 86 05/22/2024   HDL 75 05/22/2024   LDLCALC 130 (H) 05/22/2024   ALT 9 05/22/2024   AST 11 05/22/2024   NA 142 05/22/2024   K 4.3 05/22/2024   CL 106 05/22/2024   CREATININE 0.64 05/22/2024   BUN 21 05/22/2024   CO2 27 05/22/2024   TSH 2.04 05/22/2024   HGBA1C 5.6 10/01/2015    MM 3D DIAGNOSTIC MAMMOGRAM UNILATERAL LEFT BREAST Result Date: 04/22/2024 CLINICAL DATA:  Patient is status post LEFT lumpectomy and radiation for DCIS in 2021. Patient is under BI-RADS 3 follow-up observation of a LEFT breast asymmetry described on prior diagnostic exam from November 2024. Patient underwent a benign MRI in December 2024. EXAM: DIGITAL DIAGNOSTIC UNILATERAL LEFT MAMMOGRAM WITH TOMOSYNTHESIS AND CAD TECHNIQUE: Left digital diagnostic mammography and breast tomosynthesis was performed. The images were evaluated with computer-aided detection. COMPARISON:  Previous exam(s). ACR Breast Density Category b:  There are scattered areas of fibroglandular density. FINDINGS: There is density and architectural distortion within the LEFT breast, consistent with postsurgical changes. These are stable in comparison to prior. There is mammographic stability of a subtle asymmetry in the LEFT outer breast seen on CC view only. No new suspicious findings are noted. IMPRESSION: 1. Stable mammographic appearance of a LEFT breast asymmetry seen on CC view only. This is favored to be due to technique. Recommend additional follow-up diagnostic mammogram in 6 months. RECOMMENDATION: Recommend bilateral diagnostic mammogram (with RIGHT and LEFT breast ultrasound if deemed necessary) in 6 months. Patient is due for contralateral screening at this point in time. I have discussed the findings and recommendations with the patient. If applicable, a reminder letter will be sent to the patient regarding the next appointment. BI-RADS CATEGORY  3: Probably benign. Electronically Signed   By: Corean Salter M.D.   On: 04/22/2024 15:30       Assessment & Plan:  Dysuria -     POCT Urinalysis Dipstick (Automated) -     Urine Culture -     Urine Microscopic  Suprapubic pain Assessment & Plan: Symptoms as outlined. She is concerned regarding a possible UTI. Symptoms started one week ago. Currently on suppressive dose of macrodantin . Urine dip negative. Discussed holding on further abx until culture returns. Discussed taking AZO or pyridim - to help with spasms. Stay hydrated. Await culture. Has f/u soon with urology.       Allena Hamilton, MD

## 2024-08-28 ENCOUNTER — Ambulatory Visit: Payer: Self-pay | Admitting: Internal Medicine

## 2024-08-28 ENCOUNTER — Encounter: Payer: Self-pay | Admitting: Internal Medicine

## 2024-08-28 LAB — URINE CULTURE
MICRO NUMBER:: 16974148
Result:: NO GROWTH
SPECIMEN QUALITY:: ADEQUATE

## 2024-08-28 LAB — URINALYSIS, MICROSCOPIC ONLY

## 2024-08-28 NOTE — Assessment & Plan Note (Signed)
 Symptoms as outlined. She is concerned regarding a possible UTI. Symptoms started one week ago. Currently on suppressive dose of macrodantin . Urine dip negative. Discussed holding on further abx until culture returns. Discussed taking AZO or pyridim - to help with spasms. Stay hydrated. Await culture. Has f/u soon with urology.

## 2024-08-29 ENCOUNTER — Encounter: Payer: Self-pay | Admitting: Internal Medicine

## 2024-08-30 NOTE — Telephone Encounter (Signed)
 Discussed with patient to let her know that her WBC were not high. There was no blood work checked. She did have WBC in her urine. Advised that culture did not grow infection. Pt is seeing urology 10/1

## 2024-09-06 ENCOUNTER — Ambulatory Visit: Admitting: Internal Medicine

## 2024-09-11 ENCOUNTER — Ambulatory Visit: Admitting: Physician Assistant

## 2024-09-11 VITALS — BP 133/74 | HR 66 | Wt 155.0 lb

## 2024-09-11 DIAGNOSIS — N390 Urinary tract infection, site not specified: Secondary | ICD-10-CM

## 2024-09-11 DIAGNOSIS — N3281 Overactive bladder: Secondary | ICD-10-CM

## 2024-09-11 LAB — URINALYSIS, COMPLETE
Bilirubin, UA: NEGATIVE
Glucose, UA: NEGATIVE
Ketones, UA: NEGATIVE
Leukocytes,UA: NEGATIVE
Nitrite, UA: NEGATIVE
Protein,UA: NEGATIVE
RBC, UA: NEGATIVE
Specific Gravity, UA: 1.02 (ref 1.005–1.030)
Urobilinogen, Ur: 0.2 mg/dL (ref 0.2–1.0)
pH, UA: 6 (ref 5.0–7.5)

## 2024-09-11 LAB — MICROSCOPIC EXAMINATION: Bacteria, UA: NONE SEEN

## 2024-09-11 MED ORDER — NITROFURANTOIN MACROCRYSTAL 100 MG PO CAPS: 100.0000 mg | ORAL_CAPSULE | Freq: Every day | ORAL | 3 refills | Status: AC

## 2024-09-11 NOTE — Patient Instructions (Signed)
 Your Interstim Components  Your device has three components: The implant, which you can feel in your buttock The communicator, which is the white and blue flat credit card-sized device you place over your implant when you want to adjust its settings The remote, which is the [facility] cell phone device where you adjust your settings  Think of your implant as a television, the remote as your remote control where you change the settings and turn it on and off, and the communicator as the receiver the TV needs to get instructions from the remote control. Once you turn the TV on, it's going to stay on until you turn it off. If the batteries in the remote or the communicator die, the TV will keep running. That means that your device will keep working if either of the two other components are powered off, run out of battery, or are not physically with you. In fact, you only need the communicator and remote when you are adjusting your settings (or coming to clinic to get help)--otherwise you can leave them at home!  How to Adjust Your Interstim Settings  Today we set your device to Program 1, Intensity 0.8.  Your device has 7 different programs, and you may need to try a few of them to achieve maximum results. Once you find a program that is working well for you, you may stay on it.  It takes time after changing the program to see results. Every time you change the program, you should plan to stay on that program for 2 full weeks before moving on to the next one. After the first week, if your symptoms are no better, then you may increase the intensity--but keep the program the same. If after 2 weeks on the same program you still notice no difference, then you may switch to a different program and repeat the process.

## 2024-09-15 NOTE — Progress Notes (Signed)
 09/11/2024 2:21 PM   Rose Bernard 1966/05/23 [id number]  CC: Chief Complaint  Patient presents with   Follow-up   interstim    HPI: Rose Bernard is a 58 y.o. female with PMH ureteral reimplant in the 1990s and severe refractory OAB s/p InterStim with Dr. Gaston in 2024 who presents today for evaluation of possible UTI.   Today she reports 2 weeks of RLQ pain that initially radiated down both legs, the leg pain has now resolved.  No saddle anesthesia.  She has also had some right low back pain which is improving but has not fully resolved.  She denies dysuria, falls, or significant weight fluctuations.  She still feels her device stimulation in the same place.  She has had some recent increased frequency and urgency and a couple of episodes of nocturnal enuresis.  On arrival, InterStim is set to P3, 1.8 V.  She does not know how to change her settings.  Additionally, she is supposed to be on daily suppressive Macrodantin  per Dr. Gaston and requests a refill for this today.  In-office UA and microscopy pan negative.  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 Hysterectomy and bladder repair with subsequent appendectomy. L Ovary remain in place    APPENDECTOMY     2001 due to scar tissue   BLADDER BOTOX   2014, 2015, 2016   BREAST BIOPSY Bilateral 1991   BREAST BIOPSY Left 12/12/2019   us  bx, ATYPICAL DUCTAL HYPERPLASIA.   ADDITIONAL FINDINGS: FIBROCYSTIC CHANGES, COLUMNAR CELL CHANGE, STROMAL   SCLEROSIS, USUAL DUCTAL HYPERPLASIA.   BREAST LUMPECTOMY     BREAST SURGERY Bilateral 2002   benign mass removed   CESAREAN SECTION N/A    COLONOSCOPY  2014   COLONOSCOPY N/A 04/01/2024   Procedure: COLONOSCOPY;  Surgeon: Onita Elspeth Sharper, DO;  Location: [facility] ENDOSCOPY;  Service: Gastroenterology;  Laterality: N/A;   EXCISION OF BREAST BIOPSY Left 01/10/2020   Procedure: EXCISION OF BREAST BIOPSY w/ Needle localization;  Surgeon: Rodolph Romano, MD;  Location: [facility] ORS;  Service: General;  Laterality: Left;   FOOT SURGERY Bilateral    INTERSTIM IMPLANT PLACEMENT N/A 06/19/2023   Procedure: RENNA IMPLANT FIRST STAGE WITH IMPEDENCE CHECK;  Surgeon: Gaston Hamilton, MD;  Location: [facility] ORS;  Service: Urology;  Laterality: N/A;   INTERSTIM IMPLANT PLACEMENT N/A 06/19/2023   Procedure: RENNA IMPLANT SECOND STAGE;  Surgeon: Gaston Hamilton, MD;  Location: [facility] ORS;  Service: Urology;  Laterality: N/A;   LAPAROSCOPIC HYSTERECTOMY  1995   MASTECTOMY, PARTIAL Left 01/31/2020   Procedure: MASTECTOMY PARTIAL;  Surgeon: Rodolph Romano, MD;  Location: [facility] ORS;  Service: General;  Laterality: Left;   reconstruction of bladder   1995   2/2 delivery complications with 3rd child     Home Medications:  Allergies as of 09/11/2024       Reactions   Trimpex  [trimethoprim ] Nausea Only   Penicillin V Potassium Rash   Did it involve swelling of the face/tongue/throat, SOB, or low  BP? No Did it involve sudden or severe rash/hives, skin peeling, or any reaction on the inside of your mouth or nose? No Did you need to seek medical attention at a hospital or doctor's office? No When did it last happen?      25 years ago If all above answers are "NO", may proceed with cephalosporin use.        Medication List        Accurate as of September 11, 2024 11:59 PM. If you have any questions, ask your nurse or doctor.          Azelastine  HCl 137 MCG/SPRAY Soln Place 1 spray into  both nostrils 2 (two) times daily.   fluticasone  50 MCG/ACT nasal spray Commonly known as: FLONASE  Place 2 sprays into both nostrils daily.   mupirocin  ointment 2 % Commonly known as: BACTROBAN  Apply 1 Application topically 2 (two) times daily.   nitrofurantoin  100 MG capsule Commonly known as: Macrodantin  Take 1 capsule (100 mg total) by mouth daily.   Vitamin E/D-Alpha Natural 268 MG (400 UNIT) Caps Generic drug: Vitamin E Take by mouth.        Allergies:  Allergies  Allergen Reactions   Trimpex  [Trimethoprim ] Nausea Only   Penicillin V Potassium Rash    Did it involve swelling of the face/tongue/throat, SOB, or low BP? No Did it involve sudden or severe rash/hives, skin peeling, or any reaction on the inside of your mouth or nose? No Did you need to seek medical attention at a hospital or doctor's office? No When did it last happen?      25 years ago If all above answers are "NO", may proceed with cephalosporin use.     Family History: Family History  Problem Relation Age of Onset   Colon cancer Father        With ostomy in place    Breast cancer Paternal Aunt 78   Breast cancer Maternal Aunt    Breast cancer Maternal Aunt     Social History:   reports that she has never smoked. She has never been exposed to tobacco smoke. She has never used smokeless tobacco. She reports that she does not drink alcohol and does not use drugs.  Physical Exam: BP 133/74 (BP Location: Left Arm, Patient Position: Sitting, Cuff Size: Normal)   Pulse 66   Wt 155 lb (70.3 kg)   LMP 02/07/1994   SpO2 100%   BMI 24.28 kg/m   Constitutional:  Alert and oriented, no acute distress, nontoxic appearing HEENT: [place], AT Cardiovascular: No clubbing, cyanosis, or edema Respiratory: Normal respiratory effort, no increased work of breathing Skin: No rashes, bruises or suspicious lesions Neurologic: Grossly intact, no focal deficits, moving all 4 extremities Psychiatric: Normal mood and  affect  Laboratory Data: Results for orders placed or performed in visit on 09/11/24  Microscopic Examination   Collection Time: 09/11/24  4:02 PM   Urine  Result Value Ref Range   WBC, UA 0-5 0 - 5 /hpf   RBC, Urine 0-2 0 - 2 /hpf   Epithelial Cells (non renal) 0-10 0 - 10 /hpf   Bacteria, UA None seen None seen/Few  Urinalysis, Complete   Collection Time: 09/11/24  4:02 PM  Result Value Ref Range   Specific Gravity, UA 1.020 1.005 - 1.030   pH, UA 6.0 5.0 - 7.5   Color, UA Yellow Yellow   Appearance Ur Clear Clear   Leukocytes,UA Negative Negative   Protein,UA Negative Negative/Trace  Glucose, UA Negative Negative   Ketones, UA Negative Negative   RBC, UA Negative Negative   Bilirubin, UA Negative Negative   Urobilinogen, Ur 0.2 0.2 - 1.0 mg/dL   Nitrite, UA Negative Negative   Microscopic Examination Comment    Microscopic Examination See below:    Assessment & Plan:   1. OAB (overactive bladder) (Primary) Recent increased OAB symptoms in the setting of low back pain that radiates down her legs.  No saddle anesthesia, low suspicion for cauda equina.  I suspect underlying radicular back pain.  I showed her how to change her device settings today and am giving her instructions in her paperwork.  I changed her device to P1, 0.8 V and we discussed that she should proceed sequentially from programs 1 through 7 with 2 weeks spent on each program until she finds a setting that works well for her. - Urinalysis, Complete  2. Recurrent UTI UA bland today, low suspicion for UTI.  Will refill daily suppressive nitrofurantoin  per Dr. MacDiarmid. - nitrofurantoin  (MACRODANTIN ) 100 MG capsule; Take 1 capsule (100 mg total) by mouth daily.  Dispense: 90 capsule; Refill: 3  Return for Keep follow-up as scheduled.  Lucie Hones, PA-C  [facility] [place] [address], [address] [place], [place] [address] [phone]

## 2024-09-19 ENCOUNTER — Other Ambulatory Visit (INDEPENDENT_AMBULATORY_CARE_PROVIDER_SITE_OTHER)

## 2024-09-19 DIAGNOSIS — E7800 Pure hypercholesterolemia, unspecified: Secondary | ICD-10-CM

## 2024-09-19 LAB — LIPID PANEL
Cholesterol: 211 mg/dL — ABNORMAL HIGH (ref 0–200)
HDL: 62.9 mg/dL (ref >39.00)
LDL Cholesterol: 136 mg/dL — ABNORMAL HIGH (ref 0–99)
NonHDL: 148.28
Total CHOL/HDL Ratio: 3
Triglycerides: 61 mg/dL (ref 0.0–149.0)
VLDL: 12.2 mg/dL (ref 0.0–40.0)

## 2024-09-19 LAB — BASIC METABOLIC PANEL WITH GFR
BUN: 17 mg/dL (ref 6–23)
CO2: 30 meq/L (ref 19–32)
Calcium: 8.4 mg/dL (ref 8.4–10.5)
Chloride: 106 meq/L (ref 96–112)
Creatinine, Ser: 0.72 mg/dL (ref 0.40–1.20)
GFR: 92 mL/min (ref >60.00)
Glucose, Bld: 93 mg/dL (ref 70–99)
Potassium: 4.2 meq/L (ref 3.5–5.1)
Sodium: 141 meq/L (ref 135–145)

## 2024-09-19 LAB — HEPATIC FUNCTION PANEL
ALT: 11 U/L (ref 0–35)
AST: 12 U/L (ref 0–37)
Albumin: 3.9 g/dL (ref 3.5–5.2)
Alkaline Phosphatase: 97 U/L (ref 39–117)
Bilirubin, Direct: 0.1 mg/dL (ref 0.0–0.3)
Total Bilirubin: 0.6 mg/dL (ref 0.2–1.2)
Total Protein: 6.2 g/dL (ref 6.0–8.3)

## 2024-09-20 ENCOUNTER — Ambulatory Visit: Payer: Self-pay | Admitting: Internal Medicine

## 2024-10-04 ENCOUNTER — Ambulatory Visit (INDEPENDENT_AMBULATORY_CARE_PROVIDER_SITE_OTHER): Admitting: Internal Medicine

## 2024-10-04 VITALS — BP 108/56 | HR 64 | Temp 98.1°F | Ht 67.0 in | Wt 165.0 lb

## 2024-10-04 DIAGNOSIS — M25579 Pain in unspecified ankle and joints of unspecified foot: Secondary | ICD-10-CM

## 2024-10-04 DIAGNOSIS — R600 Localized edema: Secondary | ICD-10-CM | POA: Diagnosis not present

## 2024-10-04 DIAGNOSIS — Z800 Family history of malignant neoplasm of digestive organs: Secondary | ICD-10-CM

## 2024-10-04 DIAGNOSIS — F439 Reaction to severe stress, unspecified: Secondary | ICD-10-CM

## 2024-10-04 DIAGNOSIS — I83811 Varicose veins of right lower extremities with pain: Secondary | ICD-10-CM

## 2024-10-04 DIAGNOSIS — I872 Venous insufficiency (chronic) (peripheral): Secondary | ICD-10-CM

## 2024-10-04 DIAGNOSIS — E7800 Pure hypercholesterolemia, unspecified: Secondary | ICD-10-CM | POA: Diagnosis not present

## 2024-10-04 DIAGNOSIS — D0512 Intraductal carcinoma in situ of left breast: Secondary | ICD-10-CM

## 2024-10-04 DIAGNOSIS — N390 Urinary tract infection, site not specified: Secondary | ICD-10-CM

## 2024-10-04 NOTE — Progress Notes (Addendum)
 Subjective:    Patient ID: Rose Bernard, female    DOB: 02-Mar-1966, 58 y.o.   MRN: [id number]  Patient here for No chief complaint on file.   HPI Here for a scheduled follow up. Saw urology 09/11/24 - OAB. Interstim adjusted. On macrodantin  - suppressive abx. Helped her urinary symptoms some. Saw podiatry 07/01/24 - compression hose and f/u [facility]. Recommended power step Pinnacle inserts. On questioning her, she reports she still has the swelling, but also having persistent pain - top of ankle/foot. Request to prescription antiinflammatory medication. Previous surgery in this foot. Increased pain with flexion of her foot. Had f/u with Dr Babara 10/17/23 - off endocrine therapy (did not tolerate). Recommended continuing annual mammogram screening.    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 Hysterectomy and bladder repair with subsequent appendectomy. L Ovary remain in place    APPENDECTOMY     2001 due to scar tissue   BLADDER BOTOX   2014, 2015, 2016   BREAST BIOPSY Bilateral 1991   BREAST BIOPSY Left 12/12/2019   us  bx, ATYPICAL DUCTAL HYPERPLASIA.   ADDITIONAL FINDINGS: FIBROCYSTIC CHANGES, COLUMNAR CELL CHANGE, STROMAL  SCLEROSIS, USUAL DUCTAL HYPERPLASIA.   BREAST LUMPECTOMY     BREAST SURGERY Bilateral 2002   benign mass removed   CESAREAN SECTION N/A    COLONOSCOPY  2014   COLONOSCOPY N/A 04/01/2024   Procedure: COLONOSCOPY;  Surgeon: Onita Elspeth Sharper, DO;  Location: [facility];  Service: Gastroenterology;  Laterality: N/A;   EXCISION OF  BREAST BIOPSY Left 01/10/2020   Procedure: EXCISION OF BREAST BIOPSY w/ Needle localization;  Surgeon: Rodolph Romano, MD;  Location: [facility] ORS;  Service: General;  Laterality: Left;   FOOT SURGERY Bilateral    INTERSTIM IMPLANT PLACEMENT N/A 06/19/2023   Procedure: RENNA IMPLANT FIRST STAGE WITH IMPEDENCE CHECK;  Surgeon: Gaston Hamilton, MD;  Location: [facility] OR[facility];  Service: Urology;  Laterality: N/A;   INTERSTIM IMPLANT PLACEMENT N/A 06/19/2023   Procedure: RENNA IMPLANT SECOND STAGE;  Surgeon: Gaston Hamilton, MD;  Location: [facility] ORS;  Service: Urology;  Laterality: N/A;   LAPAROSCOPIC HYSTERECTOMY  1995   MASTECTOMY, PARTIAL Left 01/31/2020   Procedure: MASTECTOMY PARTIAL;  Surgeon: Rodolph Romano, MD;  Location: [facility] ORS;  Service: General;  Laterality: Left;   reconstruction of bladder   1995   2/2 delivery complications with 3rd child    Family History  Problem Relation Age of Onset   Colon cancer Father        With ostomy in place    Breast cancer Paternal Aunt 58   Breast cancer Maternal Aunt    Breast cancer Maternal Aunt    Social History   Socioeconomic History   Marital status: Married    Spouse name: Not on file   Number of children: 2   Years of education: Not on file   Highest education level: Not on file  Occupational History   Not on file  Tobacco Use   Smoking status: Never    Passive  exposure: Never   Smokeless tobacco: Never  Vaping Use   Vaping status: Never Used  Substance and Sexual Activity   Alcohol use: Never   Drug use: No   Sexual activity: Yes    Birth control/protection: Surgical  Other Topics Concern   Not on file  Social History Narrative   Not on file   Social Drivers of Health   Financial Resource Strain: Not on file  Food Insecurity: Not on file  Transportation Needs: Not on file  Physical Activity: Not on file  Stress: Not on file  Social Connections: Not on file     Review of Systems   Constitutional:  Negative for appetite change and unexpected weight change.  HENT:  Negative for congestion and sinus pressure.   Respiratory:  Negative for cough, chest tightness and shortness of breath.   Cardiovascular:  Positive for leg swelling. Negative for chest pain and palpitations.  Gastrointestinal:  Negative for abdominal pain, diarrhea, nausea and vomiting.  Genitourinary:  Negative for difficulty urinating and dysuria.  Musculoskeletal:  Negative for myalgias.       Foot pain as outlined.   Skin:  Negative for color change and rash.  Neurological:  Negative for dizziness and headaches.  Psychiatric/Behavioral:  Negative for agitation and dysphoric mood.        Objective:     BP (!) 108/56   Pulse 64   Temp 98.1 F (36.7 C)   Ht 5' 7 (1.702 m)   Wt 165 lb (74.8 kg)   LMP 02/07/1994   SpO2 97%   BMI 25.84 kg/m  Wt Readings from Last 3 Encounters:  10/05/24 165 lb (74.8 kg)  09/11/24 155 lb (70.3 kg)  08/27/24 158 lb (71.7 kg)    Physical Exam Vitals reviewed.  Constitutional:      General: She is not in acute distress.    Appearance: Normal appearance.  HENT:     Head: Normocephalic and atraumatic.     Right Ear: External ear normal.     Left Ear: External ear normal.     Mouth/Throat:     Pharynx: No oropharyngeal exudate or posterior oropharyngeal erythema.  Eyes:     General: No scleral icterus.       Right eye: No discharge.        Left eye: No discharge.     Conjunctiva/sclera: Conjunctivae normal.  Neck:     Thyroid : No thyromegaly.  Cardiovascular:     Rate and Rhythm: Normal rate and regular rhythm.  Pulmonary:     Effort: No respiratory distress.     Breath sounds: Normal breath sounds. No wheezing.  Abdominal:     General: Bowel sounds are normal.     Palpations: Abdomen is soft.     Tenderness: There is no abdominal tenderness.  Musculoskeletal:     Cervical back: Neck supple. No tenderness.     Comments: Increased pedal and  ankle swelling - right foot worse. Increased pain palpation - top of right foot/ankle. Increased pain with flexion/extension of foot.   Lymphadenopathy:     Cervical: No cervical adenopathy.  Skin:    Findings: No erythema or rash.  Neurological:     Mental Status: She is alert.  Psychiatric:        Mood and Affect: Mood normal.        Behavior: Behavior normal.         Outpatient Encounter Medications as of 10/04/2024  Medication Sig   Azelastine  HCl  137 MCG/SPRAY SOLN Place 1 spray into both nostrils 2 (two) times daily.   fluticasone  (FLONASE ) 50 MCG/ACT nasal spray Place 2 sprays into both nostrils daily.   mupirocin  ointment (BACTROBAN ) 2 % Apply 1 Application topically 2 (two) times daily.   nitrofurantoin  (MACRODANTIN ) 100 MG capsule Take 1 capsule (100 mg total) by mouth daily.   Vitamin E (VITAMIN E/D-ALPHA NATURAL) 268 MG (400 UNIT) CAPS Take by mouth.   No facility-administered encounter medications on file as of 10/04/2024.     Lab Results  Component Value Date   WBC 4.2 05/22/2024   HGB 13.9 05/22/2024   HCT 43.5 05/22/2024   PLT 284 05/22/2024   GLUCOSE 93 09/19/2024   CHOL 211 (H) 09/19/2024   TRIG 61.0 09/19/2024   HDL 62.90 09/19/2024   LDLCALC 136 (H) 09/19/2024   ALT 11 09/19/2024   AST 12 09/19/2024   NA 141 09/19/2024   K 4.2 09/19/2024   CL 106 09/19/2024   CREATININE 0.72 09/19/2024   BUN 17 09/19/2024   CO2 30 09/19/2024   TSH 2.04 05/22/2024   HGBA1C 5.6 10/01/2015    MM 3D DIAGNOSTIC MAMMOGRAM UNILATERAL LEFT BREAST Result Date: 04/22/2024 CLINICAL DATA:  Patient is status post LEFT lumpectomy and radiation for DCIS in 2021. Patient is under BI-RADS 3 follow-up observation of a LEFT breast asymmetry described on prior diagnostic exam from November 2024. Patient underwent a benign MRI in December 2024. EXAM: DIGITAL DIAGNOSTIC UNILATERAL LEFT MAMMOGRAM WITH TOMOSYNTHESIS AND CAD TECHNIQUE: Left digital diagnostic mammography and breast  tomosynthesis was performed. The images were evaluated with computer-aided detection. COMPARISON:  Previous exam(s). ACR Breast Density Category b: There are scattered areas of fibroglandular density. FINDINGS: There is density and architectural distortion within the LEFT breast, consistent with postsurgical changes. These are stable in comparison to prior. There is mammographic stability of a subtle asymmetry in the LEFT outer breast seen on CC view only. No new suspicious findings are noted. IMPRESSION: 1. Stable mammographic appearance of a LEFT breast asymmetry seen on CC view only. This is favored to be due to technique. Recommend additional follow-up diagnostic mammogram in 6 months. RECOMMENDATION: Recommend bilateral diagnostic mammogram (with RIGHT and LEFT breast ultrasound if deemed necessary) in 6 months. Patient is due for contralateral screening at this point in time. I have discussed the findings and recommendations with the patient. If applicable, a reminder letter will be sent to the patient regarding the next appointment. BI-RADS CATEGORY  3: Probably benign. Electronically Signed   By: Corean Salter M.D.   On: 04/22/2024 15:30       Assessment & Plan:  Lower extremity edema Assessment & Plan: Evaluated by vascular as outlined. Continue compression hose/socks.    Hypercholesteremia Assessment & Plan: The 10-year ASCVD risk score (Arnett DK, et al., 2019) is: 1.8%   Values used to calculate the score:     Age: 51 years     Clincally relevant sex: Female     Is Non-Hispanic African American: No     Diabetic: No     Tobacco smoker: No     Systolic Blood Pressure: 108 mmHg     Is BP treated: No     HDL Cholesterol: 62.9 mg/dL     Total Cholesterol: 211 mg/dL  Low cholesterol diet and exercise. Follow lipid panel.   Orders: -     Lipid panel; Future -     Basic metabolic panel with GFR; Future -     Hepatic  function panel; Future  Varicose veins of right lower  extremity with pain Assessment & Plan:  Has seen Dr Marea. S/p laser ablation of the right small saphenous vein.  Persistent lymphedema. Compression hose.    Ankle pain, unspecified chronicity, unspecified laterality Assessment & Plan: To review previous w/up and evaluation for her ankle - [facility] (Dr Rollene 02/2022) - plan MRI ankle.  Saw Dr Camelia (03/24/22)- steroid injection right ankle. S/p right ankle arthroscopy.  Reevaluated 07/2022 - plan PT and lower extremity ultrasound to confirm no DVT. F/u 08/2022 - synovitis/tenosynovitis - ankle/foot - PT. Has seen [facility] for leg swelling. Compression hose. Given continued foot and ankle pain, request referral to Dr Ashley.   Orders: -     Ambulatory referral to Podiatry  Chronic venous insufficiency Assessment & Plan: Has been seeing Dr Marea. S/p laser ablation of the right small saphenous vein. Persistent lymphedema. Recommended compression hose. Continued swelling. Continue compression hose/socks.    Ductal carcinoma in situ (DCIS) of left breast Assessment & Plan: Had f/u with Dr Babara 10/17/23 - off endocrine therapy (did not tolerate). Recommended continuing annual mammogram screening. Mammogram 10/2023 - recommended 6 month left breast mammogram - Birads III.  Recommended 6 month f/u diagnostic bilateral mammogram - scheduled for 10/2024.    Family history of colon cancer Assessment & Plan: Colonoscopy 04/01/24.    Frequent UTI Assessment & Plan:  Saw urology 09/11/24 - OAB. Interstim adjusted. On macrodantin  - suppressive abx. Helped some with her urinary symptoms. Continue f/u with urology.     Stress Assessment & Plan: Overall appears to be handling things relatively well.       Allena Hamilton, MD

## 2024-10-05 ENCOUNTER — Encounter: Payer: Self-pay | Admitting: Internal Medicine

## 2024-10-05 NOTE — Assessment & Plan Note (Signed)
 Had f/u with Dr Babara 10/17/23 - off endocrine therapy (did not tolerate). Recommended continuing annual mammogram screening. Mammogram 10/2023 - recommended 6 month left breast mammogram - Birads III.  Recommended 6 month f/u diagnostic bilateral mammogram - scheduled for 10/2024.

## 2024-10-05 NOTE — Assessment & Plan Note (Signed)
 Has seen Dr Marea. S/p laser ablation of the right small saphenous vein.  Persistent lymphedema. Compression hose.

## 2024-10-05 NOTE — Addendum Note (Signed)
 Addended by: GLENDIA ALLENA RAMAN on: 10/05/2024 11:46 AM   Modules accepted: Orders

## 2024-10-05 NOTE — Assessment & Plan Note (Signed)
 Colonoscopy 04/01/24.

## 2024-10-05 NOTE — Assessment & Plan Note (Signed)
 The 10-year ASCVD risk score (Arnett DK, et al., 2019) is: 1.8%   Values used to calculate the score:     Age: 58 years     Clincally relevant sex: Female     Is Non-Hispanic African American: No     Diabetic: No     Tobacco smoker: No     Systolic Blood Pressure: 108 mmHg     Is BP treated: No     HDL Cholesterol: 62.9 mg/dL     Total Cholesterol: 211 mg/dL  Low cholesterol diet and exercise. Follow lipid panel.

## 2024-10-05 NOTE — Assessment & Plan Note (Signed)
 Saw urology 09/11/24 - OAB. Interstim adjusted. On macrodantin  - suppressive abx. Helped some with her urinary symptoms. Continue f/u with urology.

## 2024-10-05 NOTE — Assessment & Plan Note (Signed)
 Evaluated by vascular as outlined. Continue compression hose/socks.

## 2024-10-05 NOTE — Assessment & Plan Note (Signed)
Overall appears to be handling things relatively well. 

## 2024-10-05 NOTE — Assessment & Plan Note (Signed)
 To review previous w/up and evaluation for her ankle - [facility] (Dr Rollene 02/2022) - plan MRI ankle.  Saw Dr Camelia (03/24/22)- steroid injection right ankle. S/p right ankle arthroscopy.  Reevaluated 07/2022 - plan PT and lower extremity ultrasound to confirm no DVT. F/u 08/2022 - synovitis/tenosynovitis - ankle/foot - PT. Has seen [facility] for leg swelling. Compression hose. Given continued foot and ankle pain, request referral to Dr Ashley.

## 2024-10-05 NOTE — Assessment & Plan Note (Signed)
 Has been seeing Dr Marea. S/p laser ablation of the right small saphenous vein. Persistent lymphedema. Recommended compression hose. Continued swelling. Continue compression hose/socks.

## 2024-10-24 ENCOUNTER — Ambulatory Visit

## 2024-10-24 ENCOUNTER — Ambulatory Visit
Admission: RE | Admit: 2024-10-24 | Discharge: 2024-10-24 | Disposition: A | Discharge status: Home / Self Care | Source: Ambulatory Visit | Attending: Internal Medicine | Admitting: Internal Medicine

## 2024-10-24 DIAGNOSIS — Z1231 Encounter for screening mammogram for malignant neoplasm of breast: Secondary | ICD-10-CM | POA: Diagnosis present

## 2024-10-24 DIAGNOSIS — R928 Other abnormal and inconclusive findings on diagnostic imaging of breast: Secondary | ICD-10-CM | POA: Insufficient documentation

## 2024-10-28 ENCOUNTER — Ambulatory Visit: Payer: Self-pay | Admitting: Internal Medicine

## 2024-11-06 ENCOUNTER — Encounter: Payer: Self-pay | Admitting: Oncology

## 2024-11-06 ENCOUNTER — Inpatient Hospital Stay: Payer: BC Managed Care – PPO | Attending: Oncology | Admitting: Oncology

## 2024-11-06 VITALS — BP 119/50 | HR 59 | Temp 97.6°F | Resp 20 | Wt 164.4 lb

## 2024-11-06 DIAGNOSIS — M8580 Other specified disorders of bone density and structure, unspecified site: Secondary | ICD-10-CM

## 2024-11-06 DIAGNOSIS — D0512 Intraductal carcinoma in situ of left breast: Secondary | ICD-10-CM

## 2024-11-06 NOTE — Assessment & Plan Note (Signed)
#  Osteopenia, bone density was on 04/23/2020. Continue calcium and vitamin D supplementation.  Not on any adjuvant endocrine therapy.   I defer to her primary care provider for decision of bisphosphonate treatment and surveillance.  Marland Kitchen

## 2024-11-06 NOTE — Assessment & Plan Note (Addendum)
 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 Patient did not tolerate aromatase inhibitor.  Currently off endocrine therapy. Last bilateral mammogram results were reviewed  Physical examination not remarkable.   She will be 5 years after surgery by Feb 2026 Continue annual mammogram screening. - ordered by PCP

## 2024-11-06 NOTE — Progress Notes (Signed)
 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 Patient did not tolerate aromatase inhibitor.  Currently off endocrine therapy. Last bilateral mammogram results were reviewed  Physical examination not remarkable.   She will be 5 years after surgery by Feb 2026 Continue annual mammogram screening. - ordered by PCP   Osteopenia #Osteopenia, bone density was on 04/23/2020. Continue calcium and vitamin D supplementation.  Not on any adjuvant endocrine therapy.   I defer to her primary care provider for decision of bisphosphonate treatment and surveillance.  .     No orders of the defined types were placed in this encounter.  Patient is discharged from my clinic. I recommend patient to continue follow up with primary care physician. Patient may re-establish care in the future if clinically indicated.    All questions were answered. The patient knows to call the clinic with any problems, questions or concerns.  Rose Cap, MD, PhD [facility] Hematology Oncology 11/06/2024   CHIEF COMPLAINTS/REASON FOR VISIT:  Follow up for DCIS  HISTORY OF PRESENTING ILLNESS:   Rose [facility] is a  58 y.o.  female with PMH listed below was seen in consultation at the request of  Rose Shad, MD  for evaluation of DCIS. 10/15/2019 screening bilateral mammogram showed possible left breast mass and calcification. 10/25/2019 patient had unilateral diagnostic mammogram of left breast which showed indeterminate mass in the left breast at 4:00.  Patient underwent ultrasound-guided biopsy of the left breast mass 12/12/2019 left breast ultrasound-guided biopsy showed atypical ductal hyperplastic. Patient was evaluated by Dr. Cesar and had left breast mass excisional biopsy on 01/10/2020. Pathology showed DCIS, nuclear grade 1-2, background  columnar cell changes, papillary change, fibrocystic changes.  Size of the DCIS 21 mm, margins positive for DCIS in the anterior and inferior multifocal.  Patient was referred to [facility] for discussion of further management. Today she denies any new complaints.  Some soreness at the site of excisional biopsy. Menarche age 58. Patient had a hysterectomy at age of 41. She takes estradiol  for hot flash symptoms for about a year half. Family history positive for maternal aunt and paternal aunt were both diagnosed with breast cancer, her father was diagnosed with colon cancer.  She had remote breast biopsy in 1991.    01/10/20 Initial lumpectomy showed positive DCIS margin. 01/31/20 Patient underwent reexcision and achieved a negative margin. Patient finished adjuvant radiation on 03/30/2020.  # Patient started on Arimidex  around 04/18/2020 and after taking 2 days, she noticed left ankle swelling for about a week.  She presented to the emergency room on 04/25/2020 for evaluation.There was no DVT., however there does appear to be a superficial thrombophlebitis involving medial superficial veins overlying the ankle. Patient was advised to try NSAIDs and compression stocking.   Family history of cancer, declined genetic testing. Family history of thrombosis and personal history of superficial thrombophlebitis.  She had hypercoagulable work-up done including negative antiphospholipid antibody profile, negative factor V Leiden gene mutation, negative prothrombin gene mutation.   Patient was last seen by me on 05/12/2020.  At that time patient has developed superficial thrombophlebitis. patient then switched care to [facility] for second opinion.  Patient was initially recommended to resume Arimidex .  Patient developed a headache and a bone pain and was switched to Exemestane.  Per care everywhere, no additional documented records of another visit with  hematology oncology there.  Per patient, she was not able to  tolerate Exemestane as well and eventually she was recommended by Rose [facility] to stay off medication.  Patient was seen by primary care provider Rose [facility] and bilateral diagnostic mammogram was obtained on 10/11/2021. Stable left lumpectomy site.  No evidence of malignancy.  09/15/2021, patient was diagnosed of plantar fasciitis.  Patient was recommended to wear plantar fascial brace.  10/15/2021 Re- establish care with me for follow-up of DCIS. On mabrobid long term for UTI prevention.   INTERVAL HISTORY Rose [facility] is a 58 y.o. female who has above history reviewed by me today presents to reestablish care for history of DCIS Patient is off endocrine therapy.  She has no new breast concern today. She performs self breast examination   Review of Systems  Constitutional:  Negative for appetite change, chills, fatigue and fever.  HENT:   Negative for hearing loss and voice change.   Eyes:  Negative for eye problems.  Respiratory:  Negative for chest tightness and cough.   Cardiovascular:  Negative for chest pain.  Gastrointestinal:  Negative for abdominal distention, abdominal pain and blood in stool.  Endocrine: Negative for hot flashes.  Genitourinary:  Negative for difficulty urinating and frequency.   Musculoskeletal:  Negative for arthralgias.  Skin:  Negative for itching and rash.  Neurological:  Negative for extremity weakness.  Hematological:  Negative for adenopathy.  Psychiatric/Behavioral:  Negative for confusion.     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 Hysterectomy and bladder repair with subsequent appendectomy. L Ovary remain in place    APPENDECTOMY     2001 due to scar tissue   BLADDER BOTOX   2014, 2015, 2016   BREAST BIOPSY Bilateral 1991   BREAST BIOPSY Left 12/12/2019   us  bx, ATYPICAL DUCTAL HYPERPLASIA.   ADDITIONAL FINDINGS: FIBROCYSTIC CHANGES, COLUMNAR CELL CHANGE, STROMAL  SCLEROSIS, USUAL DUCTAL HYPERPLASIA.   BREAST LUMPECTOMY     BREAST SURGERY Bilateral 2002   benign mass removed   CESAREAN SECTION N/A    COLONOSCOPY  2014   COLONOSCOPY N/A 04/01/2024   Procedure: COLONOSCOPY;  Surgeon: Onita Elspeth Sharper, DO;  Location: [facility];  Service: Gastroenterology;  Laterality: N/A;   EXCISION OF BREAST BIOPSY Left 01/10/2020   Procedure: EXCISION OF BREAST BIOPSY w/ Needle localization;  Surgeon: Rodolph Romano, MD;  Location: [facility] ORS;  Service: General;  Laterality: Left;   FOOT SURGERY Bilateral    INTERSTIM IMPLANT PLACEMENT N/A 06/19/2023   Procedure: RENNA IMPLANT FIRST STAGE WITH IMPEDENCE CHECK;  Surgeon: Gaston Glendia, MD;  Location: [facility] ORS;  Service: Urology;  Laterality: N/A;   INTERSTIM IMPLANT PLACEMENT N/A 06/19/2023   Procedure: RENNA IMPLANT SECOND STAGE;  Surgeon: Gaston Glendia, MD;  Location: [facility] ORS;  Service: Urology;  Laterality: N/A;   LAPAROSCOPIC HYSTERECTOMY  1995   MASTECTOMY, PARTIAL Left 01/31/2020   Procedure: MASTECTOMY PARTIAL;  Surgeon: Rodolph Romano, MD;  Location: [facility] ORS;  Service: General;  Laterality: Left;   reconstruction of bladder   1995   2/2 delivery complications with 3rd child     SOCIAL HISTORY: Social History   Socioeconomic History   Marital status: Married    Spouse name: Not on file   Number of children: 2   Years of education: Not on file   Highest education level: Not on file  Occupational History   Not on file  Tobacco Use   Smoking status: Never     Passive exposure: Never   Smokeless tobacco: Never  Vaping Use   Vaping status: Never Used  Substance and Sexual Activity   Alcohol use: Never   Drug use: No   Sexual activity: Yes    Birth control/protection: Surgical  Other Topics Concern   Not on file  Social History Narrative   Not on file   Social Drivers of Health   Financial Resource Strain: Not on file  Food Insecurity: Not on file  Transportation Needs: Not on file  Physical Activity: Not on file  Stress: Not on file  Social Connections: Not on file  Intimate Partner Violence: Not on file    FAMILY HISTORY: Family History  Problem Relation Age of Onset   Colon cancer Father        With ostomy in place    Breast cancer Paternal Aunt 74   Breast cancer Maternal Aunt    Breast cancer Maternal Aunt     ALLERGIES:  is allergic to trimpex  [trimethoprim ] and penicillin v potassium.  MEDICATIONS:  Current Outpatient Medications  Medication Sig Dispense Refill   Azelastine  HCl 137 MCG/SPRAY SOLN Place 1 spray into both nostrils 2 (two) times daily. 90 mL 0   fluticasone  (FLONASE ) 50 MCG/ACT nasal spray Place 2 sprays into both nostrils daily. 48 g 1   mupirocin  ointment (BACTROBAN ) 2 % Apply 1 Application topically 2 (two) times daily. 22 g 0   nitrofurantoin  (MACRODANTIN ) 100 MG capsule Take 1 capsule (100 mg total) by mouth daily. 90 capsule 3   Vitamin E (VITAMIN E/D-ALPHA NATURAL) 268 MG (400 UNIT) CAPS Take by mouth.     No current facility-administered medications for this visit.     PHYSICAL EXAMINATION: ECOG PERFORMANCE STATUS: 1 - Symptomatic but completely ambulatory Vitals:   11/06/24 0950  BP: (!) 119/50  Pulse: (!) 59  Resp: 20  Temp: 97.6 F (36.4 C)  SpO2: 100%   Filed Weights   11/06/24 0950  Weight: 164 lb 6.4 oz (74.6 kg)    Physical Exam Constitutional:      General: She is not in acute distress. HENT:     Head: Normocephalic and atraumatic.  Eyes:     General: No scleral  icterus. Cardiovascular:     Rate and Rhythm: Normal rate and regular rhythm.     Heart sounds: Normal heart sounds.  Pulmonary:     Effort: Pulmonary effort is normal. No respiratory distress.     Breath sounds: No wheezing.  Abdominal:     General: Bowel sounds are normal. There is no distension.     Palpations: Abdomen is soft.  Musculoskeletal:        General: No deformity. Normal range of motion.     Cervical back: Normal range of motion and neck supple.  Skin:    General: Skin is warm and dry.     Findings: No erythema or rash.  Neurological:     Mental Status: She is alert and oriented to person, place, and  time. Mental status is at baseline.     Cranial Nerves: No cranial nerve deficit.     Coordination: Coordination normal.  Psychiatric:        Mood and Affect: Mood normal.    Breast exam was performed in seated and lying down position. Left breast status post lumpectomy, tissue thickening at the lumpectomy sites, with mild tenderness.  Otherwise no palpable discrete masses bilaterally.  No palpable axillary lymphadenopathy bilaterally.   LABORATORY DATA:  I have reviewed the data as listed     Latest Ref Rng & Units 05/22/2024    9:49 AM 03/08/2024    8:07 AM 02/06/2024    8:06 AM  CBC  WBC 3.8 - 10.8 Thousand/uL 4.2  5.1  3.7   Hemoglobin 11.7 - 15.5 g/dL 86.0  86.2  86.3   Hematocrit 35.0 - 45.0 % 43.5  41.2  41.9   Platelets 140 - 400 Thousand/uL 284  314.0  309.0       Latest Ref Rng & Units 09/19/2024    9:09 AM 05/22/2024    9:49 AM 02/06/2024    8:06 AM  CMP  Glucose 70 - 99 mg/dL 93  90  93   BUN 6 - 23 mg/dL 17  21  16    Creatinine 0.40 - 1.20 mg/dL 9.27  9.35  9.27   Sodium 135 - 145 mEq/L 141  142  140   Potassium 3.5 - 5.1 mEq/L 4.2  4.3  4.1   Chloride 96 - 112 mEq/L 106  106  104   CO2 19 - 32 mEq/L 30  27  29    Calcium 8.4 - 10.5 mg/dL 8.4  8.8  8.9   Total Protein 6.0 - 8.3 g/dL 6.2  6.4  7.2   Total Bilirubin 0.2 - 1.2 mg/dL 0.6  0.5  0.6    Alkaline Phos 39 - 117 U/L 97   99   AST 0 - 37 U/L 12  11  15    ALT 0 - 35 U/L 11  9  10       RADIOGRAPHIC STUDIES: I have personally reviewed the radiological images as listed and agreed with the findings in the report.  MM 3D DIAGNOSTIC MAMMOGRAM BILATERAL BREAST Result Date: 10/24/2024 CLINICAL DATA:  Patient is status post LEFT lumpectomy and radiation for DCIS in 2021. Patient is under BI-RADS 3 follow-up observation of a LEFT breast asymmetry described on diagnostic examination from November 2024. Patient had a subsequent MRI in December of 2024 without evidence of disease. Six-month follow-up diagnostic mammogram in May of 2025 was stable. EXAM: DIGITAL DIAGNOSTIC BILATERAL MAMMOGRAM WITH TOMOSYNTHESIS AND CAD TECHNIQUE: Bilateral digital diagnostic mammography and breast tomosynthesis was performed. The images were evaluated with computer-aided detection. COMPARISON:  Previous exam(s). ACR Breast Density Category b: There are scattered areas of fibroglandular density. FINDINGS: No mammographic evidence of malignancy in the RIGHT breast. No new or suspicious findings in the LEFT breast. The previously described LEFT breast asymmetry is again seen only on the CC view and is without interval adverse change. IMPRESSION: 1.  No mammographic evidence of malignancy in the RIGHT breast. 2. Unchanged mammographic appearance probably benign LEFT breast asymmetry on this 12 month follow-up. A final follow-up diagnostic mammogram in 12 months is recommended to confirm 2 years of stability. RECOMMENDATION: A diagnostic mammogram of the BILATERAL breasts (with BILATERAL ultrasound if deemed necessary) is recommended in 1 year. I have discussed the findings and recommendations with the patient. If applicable, a  reminder letter will be sent to the patient regarding the next appointment. BI-RADS CATEGORY  3: Probably benign. Electronically Signed   By: Norleen Croak M.D.   On: 10/24/2024 15:27

## 2025-03-03 ENCOUNTER — Ambulatory Visit: Admitting: Urology

## 2025-03-17 ENCOUNTER — Other Ambulatory Visit

## 2025-03-19 ENCOUNTER — Encounter: Admitting: Internal Medicine
